# Patient Record
Sex: Female | Born: 1955
Health system: Southern US, Community
[De-identification: ages and names within clinical notes are randomized; demographics above are authoritative.]

## PROBLEM LIST (undated history)

## (undated) DIAGNOSIS — Z860101 Personal history of adenomatous and serrated colon polyps: Secondary | ICD-10-CM

## (undated) DIAGNOSIS — Z87442 Personal history of urinary calculi: Secondary | ICD-10-CM

## (undated) DIAGNOSIS — F32A Depression, unspecified: Secondary | ICD-10-CM

## (undated) DIAGNOSIS — IMO0002 Reserved for concepts with insufficient information to code with codable children: Secondary | ICD-10-CM

## (undated) DIAGNOSIS — F419 Anxiety disorder, unspecified: Secondary | ICD-10-CM

## (undated) DIAGNOSIS — R011 Cardiac murmur, unspecified: Secondary | ICD-10-CM

## (undated) DIAGNOSIS — T7840XA Allergy, unspecified, initial encounter: Secondary | ICD-10-CM

## (undated) DIAGNOSIS — K219 Gastro-esophageal reflux disease without esophagitis: Secondary | ICD-10-CM

## (undated) DIAGNOSIS — J189 Pneumonia, unspecified organism: Secondary | ICD-10-CM

## (undated) DIAGNOSIS — R519 Headache, unspecified: Secondary | ICD-10-CM

## (undated) DIAGNOSIS — Z9109 Other allergy status, other than to drugs and biological substances: Secondary | ICD-10-CM

## (undated) DIAGNOSIS — Z8711 Personal history of peptic ulcer disease: Secondary | ICD-10-CM

## (undated) DIAGNOSIS — R609 Edema, unspecified: Secondary | ICD-10-CM

## (undated) DIAGNOSIS — R51 Headache: Secondary | ICD-10-CM

## (undated) DIAGNOSIS — I10 Essential (primary) hypertension: Secondary | ICD-10-CM

## (undated) DIAGNOSIS — D571 Sickle-cell disease without crisis: Secondary | ICD-10-CM

## (undated) DIAGNOSIS — F329 Major depressive disorder, single episode, unspecified: Secondary | ICD-10-CM

## (undated) DIAGNOSIS — G4730 Sleep apnea, unspecified: Secondary | ICD-10-CM

## (undated) DIAGNOSIS — G609 Hereditary and idiopathic neuropathy, unspecified: Secondary | ICD-10-CM

## (undated) DIAGNOSIS — Z973 Presence of spectacles and contact lenses: Secondary | ICD-10-CM

## (undated) DIAGNOSIS — M797 Fibromyalgia: Secondary | ICD-10-CM

## (undated) DIAGNOSIS — M1990 Unspecified osteoarthritis, unspecified site: Secondary | ICD-10-CM

## (undated) DIAGNOSIS — Z86010 Personal history of colonic polyps: Secondary | ICD-10-CM

## (undated) DIAGNOSIS — G5601 Carpal tunnel syndrome, right upper limb: Secondary | ICD-10-CM

## (undated) DIAGNOSIS — Z8719 Personal history of other diseases of the digestive system: Secondary | ICD-10-CM

## (undated) DIAGNOSIS — R1310 Dysphagia, unspecified: Secondary | ICD-10-CM

## (undated) HISTORY — DX: Sickle-cell disease without crisis: D57.1

## (undated) HISTORY — PX: ABDOMINAL HYSTERECTOMY: SHX81

## (undated) HISTORY — DX: Hereditary and idiopathic neuropathy, unspecified: G60.9

## (undated) HISTORY — DX: Sleep apnea, unspecified: G47.30

## (undated) HISTORY — DX: Cardiac murmur, unspecified: R01.1

## (undated) HISTORY — DX: Essential (primary) hypertension: I10

## (undated) HISTORY — DX: Fibromyalgia: M79.7

## (undated) HISTORY — DX: Allergy, unspecified, initial encounter: T78.40XA

## (undated) HISTORY — DX: Gastro-esophageal reflux disease without esophagitis: K21.9

## (undated) HISTORY — PX: SPINE SURGERY: SHX786

## (undated) HISTORY — DX: Personal history of urinary calculi: Z87.442

## (undated) HISTORY — DX: Reserved for concepts with insufficient information to code with codable children: IMO0002

## (undated) HISTORY — DX: Edema, unspecified: R60.9

## (undated) HISTORY — DX: Dysphagia, unspecified: R13.10

---

## 1980-12-16 HISTORY — PX: TUBAL LIGATION: SHX77

## 1999-01-12 LAB — HM MAMMOGRAPHY: HM Mammogram: NORMAL

## 1999-07-02 ENCOUNTER — Emergency Department (HOSPITAL_COMMUNITY): Admission: EM | Admit: 1999-07-02 | Discharge: 1999-07-03 | Payer: Self-pay | Admitting: Emergency Medicine

## 1999-07-25 ENCOUNTER — Emergency Department (HOSPITAL_COMMUNITY): Admission: EM | Admit: 1999-07-25 | Discharge: 1999-07-25 | Payer: Self-pay | Admitting: Internal Medicine

## 2001-05-14 ENCOUNTER — Encounter: Admission: RE | Admit: 2001-05-14 | Discharge: 2001-07-15 | Payer: Self-pay | Admitting: Internal Medicine

## 2001-07-27 ENCOUNTER — Emergency Department (HOSPITAL_COMMUNITY): Admission: EM | Admit: 2001-07-27 | Discharge: 2001-07-27 | Payer: Self-pay | Admitting: Emergency Medicine

## 2001-11-30 ENCOUNTER — Other Ambulatory Visit: Admission: RE | Admit: 2001-11-30 | Discharge: 2001-11-30 | Payer: Self-pay | Admitting: Internal Medicine

## 2002-02-02 ENCOUNTER — Encounter: Payer: Self-pay | Admitting: Emergency Medicine

## 2002-02-02 ENCOUNTER — Emergency Department (HOSPITAL_COMMUNITY): Admission: EM | Admit: 2002-02-02 | Discharge: 2002-02-02 | Payer: Self-pay | Admitting: Emergency Medicine

## 2002-02-10 ENCOUNTER — Encounter: Payer: Self-pay | Admitting: Internal Medicine

## 2002-02-10 ENCOUNTER — Encounter: Admission: RE | Admit: 2002-02-10 | Discharge: 2002-02-10 | Payer: Self-pay | Admitting: Internal Medicine

## 2002-02-18 ENCOUNTER — Ambulatory Visit (HOSPITAL_COMMUNITY): Admission: RE | Admit: 2002-02-18 | Discharge: 2002-02-18 | Payer: Self-pay | Admitting: Internal Medicine

## 2002-02-18 ENCOUNTER — Encounter: Payer: Self-pay | Admitting: Internal Medicine

## 2002-10-15 ENCOUNTER — Emergency Department (HOSPITAL_COMMUNITY): Admission: EM | Admit: 2002-10-15 | Discharge: 2002-10-15 | Payer: Self-pay | Admitting: Emergency Medicine

## 2004-02-10 ENCOUNTER — Ambulatory Visit (HOSPITAL_COMMUNITY): Admission: RE | Admit: 2004-02-10 | Discharge: 2004-02-10 | Payer: Self-pay | Admitting: Obstetrics and Gynecology

## 2004-03-27 ENCOUNTER — Ambulatory Visit (HOSPITAL_BASED_OUTPATIENT_CLINIC_OR_DEPARTMENT_OTHER): Admission: RE | Admit: 2004-03-27 | Discharge: 2004-03-27 | Payer: Self-pay | Admitting: Family Medicine

## 2004-04-25 ENCOUNTER — Encounter (INDEPENDENT_AMBULATORY_CARE_PROVIDER_SITE_OTHER): Payer: Self-pay | Admitting: Specialist

## 2004-04-25 HISTORY — PX: LAPAROSCOPIC ASSISTED VAGINAL HYSTERECTOMY: SHX5398

## 2004-04-25 HISTORY — PX: LAPAROSCOPIC TOTAL HYSTERECTOMY: SUR800

## 2004-04-26 ENCOUNTER — Inpatient Hospital Stay (HOSPITAL_COMMUNITY): Admission: RE | Admit: 2004-04-26 | Discharge: 2004-04-28 | Payer: Self-pay | Admitting: Obstetrics and Gynecology

## 2004-06-07 ENCOUNTER — Encounter: Admission: RE | Admit: 2004-06-07 | Discharge: 2004-06-07 | Payer: Self-pay | Admitting: Family Medicine

## 2004-12-20 ENCOUNTER — Encounter: Admission: RE | Admit: 2004-12-20 | Discharge: 2004-12-20 | Payer: Self-pay | Admitting: Family Medicine

## 2005-07-28 ENCOUNTER — Emergency Department (HOSPITAL_COMMUNITY): Admission: EM | Admit: 2005-07-28 | Discharge: 2005-07-28 | Payer: Self-pay | Admitting: Emergency Medicine

## 2005-09-12 ENCOUNTER — Ambulatory Visit (HOSPITAL_COMMUNITY): Admission: RE | Admit: 2005-09-12 | Discharge: 2005-09-12 | Payer: Self-pay | Admitting: Orthopedic Surgery

## 2005-12-16 HISTORY — PX: CARPAL TUNNEL RELEASE: SHX101

## 2007-12-25 ENCOUNTER — Emergency Department (HOSPITAL_COMMUNITY): Admission: EM | Admit: 2007-12-25 | Discharge: 2007-12-25 | Payer: Self-pay | Admitting: Emergency Medicine

## 2008-09-28 ENCOUNTER — Encounter: Admission: RE | Admit: 2008-09-28 | Discharge: 2008-09-28 | Payer: Self-pay | Admitting: Family Medicine

## 2010-06-02 ENCOUNTER — Emergency Department (HOSPITAL_COMMUNITY): Admission: EM | Admit: 2010-06-02 | Discharge: 2010-06-03 | Payer: Self-pay | Admitting: Emergency Medicine

## 2010-06-07 ENCOUNTER — Ambulatory Visit (HOSPITAL_BASED_OUTPATIENT_CLINIC_OR_DEPARTMENT_OTHER): Admission: RE | Admit: 2010-06-07 | Discharge: 2010-06-07 | Payer: Self-pay | Admitting: Urology

## 2010-06-07 HISTORY — PX: OTHER SURGICAL HISTORY: SHX169

## 2010-11-20 ENCOUNTER — Emergency Department (HOSPITAL_COMMUNITY)
Admission: EM | Admit: 2010-11-20 | Discharge: 2010-11-20 | Payer: Self-pay | Source: Home / Self Care | Admitting: Family Medicine

## 2011-01-06 ENCOUNTER — Encounter: Payer: Self-pay | Admitting: Urology

## 2011-03-03 LAB — URINALYSIS, ROUTINE W REFLEX MICROSCOPIC
Glucose, UA: NEGATIVE mg/dL
Ketones, ur: NEGATIVE mg/dL
Protein, ur: 30 mg/dL — AB
Urobilinogen, UA: 0.2 mg/dL (ref 0.0–1.0)

## 2011-03-03 LAB — POCT I-STAT 4, (NA,K, GLUC, HGB,HCT)
Glucose, Bld: 117 mg/dL — ABNORMAL HIGH (ref 70–99)
HCT: 41 % (ref 36.0–46.0)
Sodium: 141 mEq/L (ref 135–145)

## 2011-03-03 LAB — URINE MICROSCOPIC-ADD ON

## 2011-05-03 NOTE — Op Note (Signed)
NAME:  Linda Buckley, Linda Buckley                           ACCOUNT NO.:  [id number]   MEDICAL RECORD NO.:  [id number]                   PATIENT TYPE:  OBV   LOCATION:  9302                                 FACILITY:  [facility]   PHYSICIAN:  Janine Limbo, M.D.            DATE OF BIRTH:  [birth year]   DATE OF PROCEDURE:  04/25/2004  DATE OF DISCHARGE:                                 OPERATIVE REPORT   PREOPERATIVE DIAGNOSES:  1. Fibroid uterus.  2. Dysmenorrhea.  3. Abdominal pain.   POSTOPERATIVE DIAGNOSES:  1. Fibroid uterus.  2. Dysmenorrhea.  3. Abdominal pain.   PROCEDURE:  Laparoscopy assisted vaginal hysterectomy.   SURGEON:  Janine Limbo, M.D.   FIRST ASSISTANT:  [place]mira J. Adline Peals.   ANESTHESIA:  General.   DISPOSITION:  [name] is a 55 year old female, para 2-0-2-2, who presents  with the above mentioned diagnosis. She understands the indications for her  surgical procedure and she accepts the risks of, but not limited to,  anesthetic complications, bleeding, infections, and possible damage to the  surrounding organs.   FINDINGS:  The uterus was upper limits normal size and there were several  fibroids present on the fundus of the uterus. There were no adhesions  present in the pelvis. There was no evidence of endometriosis.   DESCRIPTION OF PROCEDURE:  The patient was taken to the operating room where  a general anesthetic was given.  The patient's abdomen, perineum and vagina  were prepped with multiple layers of Betadine.  Examination under anesthesia  was performed.  A Foley catheter was placed in the bladder. The patient was  sterilely draped. The subumbilical area was injected with 5 mL of 0.5%  Marcaine with epinephrine.  An incision was made and the incision was  carried sharply through the subcutaneous tissue, the fascia and the anterior  peritoneum. The Hasson cannula was sutured into place. A pneumoperitoneum  was then obtained. The pelvic structures  were visualized with findings as  mentioned above. The suprapubic area was injected with 2 mL of 0.5% Marcaine  with epinephrine.  An incision was made and a 5 mm trocar was placed under  direct visualization. The pelvic structures were again visualized. Pictures  were taken of the patient's pelvic anatomy.  We were comfortable at this  point that we could proceed safely with a vaginal approach to the  hysterectomy. The laparoscope was removed.  The patient was then placed in a  more lithotomy position. The cervix was injected with a diluted solution of  Pitressin and saline. A circumferential incision was made around the cervix  and the vaginal mucosa was advanced anteriorly and posteriorly. The anterior  cul-de-sac and then the posterior cul-de-sacs were sharply entered.  Alternating from right to left, the uterosacral ligaments, paracervical  tissues, parametrial tissues, and uterine arteries were clamped, cut,  sutured and tied securely.  The uterus was then inverted  through the  posterior colpotomy. The upper pedicles were clamped and cut and the uterus  was removed from the operative field.  The upper pedicles were secured using  a free tie and then a suture ligature. Hemostasis was noted to be adequate.  The sutures attached to the uterosacral ligaments were brought out through  the vaginal angles and tied securely. A McCall culdoplasty was placed in the  posterior cul-de-sac incorporating the uterosacral ligaments bilaterally and  the posterior peritoneum.  The vaginal cuff was then closed using figure-of-  eight sutures incorporating the anterior vaginal mucosa, the anterior  peritoneum, the posterior peritoneum and the posterior vaginal mucosa.  The  McCall culdoplasty suture was tied and the apex of the vagina was noted to  elevate into the mid pelvis. Again hemostasis was thought to be adequate. We  were then ready to proceed with the remainder of the laparoscopic portion of   our procedure. The operator changed gloves. A pneumoperitoneum was then  obtained and the pelvis was inspected.  There was no evidence of bleeding.  There was no evidence of damage to the vital pelvic structures or to the  bowel. Therefore the pneumoperitoneum was allowed to escape. All instruments  were removed.  The subumbilical fascia was closed using figure-of-eight  sutures of #0 Vicryl.  The subcutaneous layer was closed using #0 Vicryl.  The skin was reapproximated using a subcuticular suture of 3-0 Vicryl.  The  suprapubic incision was closed using a subcuticular suture of 3-0 Vicryl.  Sponge, needle and instrument counts were correct.  The estimated blood loss  was approximately 75 mL.  The patient was noted to drain to clear yellow  urine.  #0 Vicryl was the suture material used throughout the procedure  except where otherwise mentioned. The patient was awakened from her  anesthetic and taken to the recovery room in stable condition.                                               Janine Limbo, M.D.    AVS/MEDQ  D:  04/25/2004  T:  04/25/2004  Job:  [id number]

## 2011-05-03 NOTE — H&P (Signed)
NAME:  Linda Buckley, Linda Buckley                           ACCOUNT NO.:  [id number]   MEDICAL RECORD NO.:  [id number]                   PATIENT TYPE:  OBV   LOCATION:  NA                                   [facility]   PHYSICIAN:  Janine Limbo, M.D.            DATE OF BIRTH:  [birth year]   DATE OF ADMISSION:  04/25/2004  DATE OF DISCHARGE:                                HISTORY & PHYSICAL   HISTORY OF PRESENT ILLNESS:  The patient is a 55 year old female, para 2-0-2-  2, who presents for laparoscopically-assisted vaginal hysterectomy.  The  patient had an ultrasound performed in April of 2005 that showed a 10.4 x  4.9 cm uterus.  A 1.12 cm fibroid was noted.  The endometrium measured 0.42  cm.  The ovaries appeared normal.  The patient complains of dysmenorrhea,  pelvic pain, and a history of irregular bleeding.  A CT scan has been  performed and no abnormalities were noted.  The patient has had an  endometrial biopsy that showed benign elements.  She has been treated with  oral contraceptives as well as narcotic and non-narcotic pain medications.  Her discomfort improved briefly, but now her discomfort has returned and it  is as bad as it has ever been.  She wishes to proceed at this time with  definite therapy.  The patient denies a past history of sexually transmitted  diseases.  She has had a tubal ligation in the past.  She has had two  miscarriages in the past.  She has had two full term vaginal deliveries.   PAST MEDICAL HISTORY:  The patient has a history of migraine headaches.  Otherwise, she is quite healthy.   SOCIAL HISTORY:  The patient drinks alcohol socially.  She denies cigarette  use and other recreational drug uses.   REVIEW OF SYSTEMS:  Please see history of present illness.   FAMILY HISTORY:  The patient's mother has hypertension.  Her mother,  brother, and her son have diabetes.  Her son also has mental and emotional  problems.   PHYSICAL EXAMINATION:  VITAL  SIGNS:  Weight is 209 pounds.  HEENT:  Within normal limits.  CHEST:  Clear.  HEART:  Regular rate and rhythm.  BREASTS:  Without masses.  ABDOMEN:  Nontender.  EXTREMITIES:  Within normal limits.  NEUROLOGY:  Grossly normal.  PELVIC:  External genitalia is normal.  The vagina is normal except for a  small cystocele and a small rectocele.  The uterus is upper limits of normal  size.  Adnexa no masses.   ASSESSMENT:  1. Dysmenorrhea.  2. Abdominal pain.  3. Fibroid uterus.   PLAN:  The patient will undergo a laparoscopically-assisted vaginal  hysterectomy.  She understands the indications for her procedure and she  accepts the risks of, but not limited to, anesthetic complications,  bleeding, infections, and possible damage to the surrounding organs.  Janine Limbo, M.D.    AVS/MEDQ  D:  04/24/2004  T:  04/24/2004  Job:  [id number]   cc:   Candyce Churn. Allyne Gee, M.D.  [address]  [address]  [place]  [place] [address]  Fax: [phone]

## 2011-05-03 NOTE — Discharge Summary (Signed)
[name], [name]                           ACCOUNT NO.:  [id number]   MEDICAL RECORD NO.:  [id number]                   PATIENT TYPE:  INP   LOCATION:  9302                                 [facility]   PHYSICIAN:  Janine Limbo, M.D.            DATE OF BIRTH:  December 20, 1955   DATE OF ADMISSION:  04/25/2004  DATE OF DISCHARGE:  04/28/2004                                 DISCHARGE SUMMARY   DISCHARGE DIAGNOSES:  1. Fibroid uterus.  2. Dysmenorrhea.  3. Abdominal pain.  4. Anemia.   OPERATION:  On the date of admission the patient underwent a laparoscopic-  assisted vaginal hysterectomy, tolerating the procedure well.  The patient  was found to have an upper-limits-of-normal-size fibroid uterus with normal-  appearing tubes and ovaries bilaterally.   HISTORY OF PRESENT ILLNESS:  [name] is a 55 year old female para 2-0-2-2  who presents for a laparoscopic-assisted vaginal hysterectomy because of  dysmenorrhea and abdominal pain.  Please see the patient's dictated History  and Physical Examination for details.   PREOPERATIVE PHYSICAL EXAMINATION:  VITAL SIGNS:  Weight is 209 pounds.  GENERAL:  Within normal limits.  PELVIC:  External genitalia is normal.  Vagina is normal except for a small  cystocele and small rectocele.  The uterus is upper limits of normal size.  Adnexa is without masses.   HOSPITAL COURSE:  On the date of admission the patient underwent  aforementioned procedures, tolerating them well.  Postoperative course was  marked by the patient experiencing urinary retention and was not able to  void on her own until postoperative day #3.  Postoperative hemoglobin was  10.8 (preoperative hemoglobin 12.6).  By postoperative day #3 the patient  had resumed bowel and bladder function and was therefore deemed ready for  discharge home.   DISCHARGE MEDICATIONS:  1. Ferrous sulfate 325 mg one tablet twice daily for 6 weeks.  2. Colace 100 mg one tablet twice  daily until bowel movements are regular.  3. Phenergan 25 mg one tablet q.6h. as needed for nausea.  4. Vicodin one to two tablets q.4-6h. as needed for pain   FOLLOW-UP:  The patient is scheduled for a 6-weeks postoperative visit with  Dr. Stefano Gaul on June 05, 2004 at 2:30 p.m.   DISCHARGE INSTRUCTIONS:  The patient was given a copy of [facility] postoperative instruction sheet.  She was further advised to avoid  driving for 2 weeks, heavy lifting for 4 weeks, and intercourse for 6 weeks.  The patient's diet was without restriction.   FINAL PATHOLOGY:  Uterus, hysterectomy:  Benign cervix with chronic  cervicitis, benign inactive endometrium, adenomyosis, leiomyoma.     Linda Buckley.                    Janine Limbo, M.D.    EJP/MEDQ  D:  05/06/2004  T:  05/07/2004  Job:  [id number] 

## 2012-01-13 LAB — HM PAP SMEAR: HM PAP: NORMAL

## 2013-10-11 ENCOUNTER — Encounter (HOSPITAL_COMMUNITY): Payer: Self-pay | Admitting: Emergency Medicine

## 2013-10-11 ENCOUNTER — Emergency Department (HOSPITAL_COMMUNITY): Payer: 59

## 2013-10-11 ENCOUNTER — Emergency Department (HOSPITAL_COMMUNITY)
Admission: EM | Admit: 2013-10-11 | Discharge: 2013-10-11 | Disposition: A | Discharge status: Home / Self Care | Payer: 59 | Attending: Emergency Medicine | Admitting: Emergency Medicine

## 2013-10-11 DIAGNOSIS — R5381 Other malaise: Secondary | ICD-10-CM | POA: Insufficient documentation

## 2013-10-11 DIAGNOSIS — J189 Pneumonia, unspecified organism: Secondary | ICD-10-CM | POA: Diagnosis present

## 2013-10-11 DIAGNOSIS — J159 Unspecified bacterial pneumonia: Secondary | ICD-10-CM | POA: Insufficient documentation

## 2013-10-11 LAB — CBC WITH DIFFERENTIAL/PLATELET
Basophils Absolute: 0 10*3/uL (ref 0.0–0.1)
Lymphs Abs: 1.1 10*3/uL (ref 0.7–4.0)
MCH: 30.1 pg (ref 26.0–34.0)
MCHC: 32.4 g/dL (ref 30.0–36.0)
MCV: 92.8 fL (ref 78.0–100.0)
Monocytes Relative: 5 % (ref 3–12)
Neutrophils Relative %: 66 % (ref 43–77)

## 2013-10-11 LAB — BASIC METABOLIC PANEL
GFR calc Af Amer: 73 mL/min — ABNORMAL LOW (ref >90)
Sodium: 139 mEq/L (ref 135–145)

## 2013-10-11 MED ORDER — AMOXICILLIN-POT CLAVULANATE 875-125 MG PO TABS: 1.0000 | ORAL_TABLET | Freq: Two times a day (BID) | ORAL | Status: AC

## 2013-10-11 MED ORDER — ACETAMINOPHEN 325 MG PO TABS
650.0000 mg | ORAL_TABLET | Freq: Once | ORAL | Status: AC
  Administered 2013-10-11: 650 mg via ORAL
  Filled 2013-10-11: qty 2

## 2013-10-11 MED ORDER — SODIUM CHLORIDE 0.9 % IV BOLUS (SEPSIS)
1000.0000 mL | INTRAVENOUS | Status: AC
  Administered 2013-10-11: 1000 mL via INTRAVENOUS

## 2013-10-11 MED ORDER — AMOXICILLIN-POT CLAVULANATE 875-125 MG PO TABS
1.0000 | ORAL_TABLET | Freq: Once | ORAL | Status: AC
  Administered 2013-10-11: 1 via ORAL
  Filled 2013-10-11: qty 1

## 2013-10-11 MED ORDER — OXYCODONE-ACETAMINOPHEN 5-325 MG PO TABS: 1.0000 | ORAL_TABLET | Freq: Four times a day (QID) | ORAL | Status: DC | PRN

## 2013-10-11 NOTE — Progress Notes (Signed)
   CARE MANAGEMENT ED NOTE 10/11/2013  Patient:  JHOSELYN, RUFFINI   Account Number:  [id number]  Date Initiated:  10/11/2013  Documentation initiated by:  Edd Arbour  Subjective/Objective Assessment:   57 yr old [facility] [place] resident with one ED visit in last 6 months Pt with betty reese listed as pcp but pt states pcp is Dr Juluis Rainier at [facility] [employer]     Subjective/Objective Assessment Detail:   c/o chest pain for past 2 weeks, dx recently with URI, chest pain, weakness/ lethargy, lightheadedness Imaging indicating PNA     Action/Plan:   Spoke with pt updated EPIC pcp   Action/Plan Detail:   Anticipated DC Date:  10/11/2013     Status Recommendation to Physician:   Result of Recommendation:    Other ED Services  Consult Working Plan    DC Planning Services  Other  PCP issues  Outpatient Services - Pt will follow up    Choice offered to / List presented to:            Status of service:  Completed, signed off  ED Comments:   ED Comments Detail:

## 2013-10-11 NOTE — ED Notes (Signed)
Pt complains of chest pain for past 2 weeks, dx recently with URI, states has not gotten better. States she has had sob for past 4 days, 98% on RA. Pt states she has cough 4 days, non productive. Also complains of weakness/ lethargy, lightheadedness. Pt states chest pain is centralized, worse with cough.

## 2013-10-11 NOTE — ED Provider Notes (Signed)
CSN: [id number]     Arrival date & time 10/11/13  4098 History   First MD Initiated Contact with Patient 10/11/13 0820     Chief Complaint  Patient presents with  . Chest Pain  . Shortness of Breath   (Consider location/radiation/quality/duration/timing/severity/associated sxs/prior Treatment) Patient is a 57 y.o. female presenting with shortness of breath and cough. The history is provided by the patient.  Shortness of Breath Associated symptoms: chest pain (chest wall pain with coughing) and cough   Associated symptoms: no abdominal pain, no fever, no headaches, no neck pain and no vomiting   Cough Cough characteristics:  Non-productive Severity:  Moderate Onset quality:  Gradual Duration:  2 weeks Timing:  Constant Progression:  Unchanged Chronicity:  New Smoker: no   Relieved by:  Nothing Worsened by:  Nothing tried Ineffective treatments: Rx cough medicine, currently finishing a z pak. Associated symptoms: chest pain (chest wall pain with coughing) and shortness of breath   Associated symptoms: no fever and no headaches   Shortness of breath:    Severity:  Mild   Onset quality:  Gradual   Duration: last 2-3 days.   Timing:  Constant   Progression:  Unchanged   History reviewed. No pertinent past medical history. History reviewed. No pertinent past surgical history. History reviewed. No pertinent family history. History  Substance Use Topics  . Smoking status: Never Smoker   . Smokeless tobacco: Not on file  . Alcohol Use: Yes   OB History   Grav Para Term Preterm Abortions TAB SAB Ect Mult Living                 Review of Systems  Constitutional: Positive for fatigue. Negative for fever.  HENT: Negative for congestion and drooling.   Eyes: Negative for pain.  Respiratory: Positive for cough and shortness of breath.   Cardiovascular: Positive for chest pain (chest wall pain with coughing).  Gastrointestinal: Negative for nausea, vomiting, abdominal pain and  diarrhea.  Genitourinary: Negative for dysuria and hematuria.  Musculoskeletal: Negative for back pain, gait problem and neck pain.  Skin: Negative for color change.  Neurological: Negative for dizziness and headaches.  Hematological: Negative for adenopathy.  Psychiatric/Behavioral: Negative for behavioral problems.  All other systems reviewed and are negative.    Allergies  Aspirin  Home Medications  No current outpatient prescriptions on file. BP 168/90  Pulse 81  Temp(Src) 98.3 F (36.8 C) (Oral)  Resp 20  SpO2 99% Physical Exam  Nursing note and vitals reviewed. Constitutional: She is oriented to person, place, and time. She appears well-developed and well-nourished.  HENT:  Head: Normocephalic.  Mouth/Throat: Oropharynx is clear and moist. No oropharyngeal exudate.  Eyes: Conjunctivae and EOM are normal. Pupils are equal, round, and reactive to light.  Neck: Normal range of motion. Neck supple.  Cardiovascular: Normal rate, regular rhythm, normal heart sounds and intact distal pulses.  Exam reveals no gallop and no friction rub.   No murmur heard. Pulmonary/Chest: Effort normal and breath sounds normal. No respiratory distress. She has no wheezes. She exhibits tenderness (diffuse anterior chest wall ttp).  Mild crackle heard in right lung base.   Abdominal: Soft. Bowel sounds are normal. There is no tenderness. There is no rebound and no guarding.  Musculoskeletal: Normal range of motion. She exhibits no edema and no tenderness.  Neurological: She is alert and oriented to person, place, and time.  Skin: Skin is warm and dry.  Psychiatric: She has a normal mood and  affect. Her behavior is normal.    ED Course  Procedures (including critical care time) Labs Review Labs Reviewed  BASIC METABOLIC PANEL - Abnormal; Notable for the following:    Glucose, Bld 113 (*)    GFR calc non Af Amer 63 (*)    GFR calc Af Amer 73 (*)    All other components within normal limits   CBC WITH DIFFERENTIAL   Imaging Review Dg Chest 2 View  10/11/2013   CLINICAL DATA:  Mid chest pain, shortness of breath  EXAM: CHEST  2 VIEW  COMPARISON:  None.  FINDINGS: Very mild patchy left lower lobe opacity, possibly reflecting pneumonia. No pleural effusion or pneumothorax.  The heart is normal in size.  Visualized osseous structures are within normal limits.  IMPRESSION: Very mild patchy left lower lobe opacity, possibly reflecting pneumonia.   Electronically Signed   By: Charline Bills M.D.   On: 10/11/2013 09:20    EKG Interpretation   None       MDM   1. CAP (community acquired pneumonia)    8:37 AM 57 y.o. female who presents with URI symptoms for approximately 2 weeks. The patient denies any fevers but has felt fatigued with continued nonproductive cough. She notes no improvement with prescription cough medicine and is currently on her fourth day of azithromycin. She notes diffuse chest wall aching pain which is constant and exacerbated by coughing. Her chest wall pain is reproducible on exam. She is afebrile and vital signs are unremarkable here. She has had mild sob for the last few days, likely assoc w/ viral URI although pna is on the ddx. Will provide symptomatic therapy and get screening lab work and a chest x-ray.  10:17 AM: CXR cw pna, labs non-contrib, VS unremarkable, pt continues to appear well. Will provide pain medicine for chest wall pain and add augmentin to azithro for pna. I have discussed the diagnosis/risks/treatment options with the patient and believe the pt to be eligible for discharge home to follow-up with pcp in the next 1-2 days. We also discussed returning to the ED immediately if new or worsening sx occur. We discussed the sx which are most concerning (e.g., inability to tolerate abx, inc cp or wob, fever) that necessitate immediate return. Any new prescriptions provided to the patient are listed below.  Discharge Medication List as of 10/11/2013  10:20 AM    START taking these medications   Details  amoxicillin-clavulanate (AUGMENTIN) 875-125 MG per tablet Take 1 tablet by mouth 2 (two) times daily., Starting 10/11/2013, Last dose on Thu 10/21/13, Print    oxyCODONE-acetaminophen (PERCOCET) 5-325 MG per tablet Take 1 tablet by mouth every 6 (six) hours as needed for pain., Starting 10/11/2013, Until Discontinued, Print         Junius Argyle, MD 10/11/13 [phone]

## 2013-10-14 ENCOUNTER — Encounter (HOSPITAL_COMMUNITY): Payer: Self-pay | Admitting: Emergency Medicine

## 2013-10-14 ENCOUNTER — Emergency Department (HOSPITAL_COMMUNITY)
Admission: EM | Admit: 2013-10-14 | Discharge: 2013-10-14 | Disposition: A | Discharge status: Home / Self Care | Payer: 59 | Attending: Emergency Medicine | Admitting: Emergency Medicine

## 2013-10-14 ENCOUNTER — Emergency Department (HOSPITAL_COMMUNITY): Payer: 59

## 2013-10-14 DIAGNOSIS — R109 Unspecified abdominal pain: Secondary | ICD-10-CM | POA: Insufficient documentation

## 2013-10-14 DIAGNOSIS — M545 Low back pain, unspecified: Secondary | ICD-10-CM | POA: Insufficient documentation

## 2013-10-14 DIAGNOSIS — J159 Unspecified bacterial pneumonia: Secondary | ICD-10-CM | POA: Insufficient documentation

## 2013-10-14 DIAGNOSIS — R000 Tachycardia, unspecified: Secondary | ICD-10-CM | POA: Insufficient documentation

## 2013-10-14 DIAGNOSIS — J189 Pneumonia, unspecified organism: Secondary | ICD-10-CM

## 2013-10-14 DIAGNOSIS — Z79899 Other long term (current) drug therapy: Secondary | ICD-10-CM | POA: Insufficient documentation

## 2013-10-14 DIAGNOSIS — Z792 Long term (current) use of antibiotics: Secondary | ICD-10-CM | POA: Insufficient documentation

## 2013-10-14 DIAGNOSIS — IMO0002 Reserved for concepts with insufficient information to code with codable children: Secondary | ICD-10-CM | POA: Insufficient documentation

## 2013-10-14 LAB — CBC WITH DIFFERENTIAL/PLATELET
Basophils Relative: 0 % (ref 0–1)
Eosinophils Absolute: 0.2 10*3/uL (ref 0.0–0.7)
HCT: 43.4 % (ref 36.0–46.0)
Hemoglobin: 14.4 g/dL (ref 12.0–15.0)
Lymphs Abs: 0.8 10*3/uL (ref 0.7–4.0)
MCH: 30.4 pg (ref 26.0–34.0)
MCHC: 33.2 g/dL (ref 30.0–36.0)
MCV: 91.6 fL (ref 78.0–100.0)
Monocytes Absolute: 0.4 10*3/uL (ref 0.1–1.0)
Monocytes Relative: 6 % (ref 3–12)
Neutro Abs: 5 10*3/uL (ref 1.7–7.7)
Neutrophils Relative %: 77 % (ref 43–77)
RBC: 4.74 MIL/uL (ref 3.87–5.11)

## 2013-10-14 LAB — BASIC METABOLIC PANEL WITH GFR
BUN: 11 mg/dL (ref 6–23)
CO2: 29 meq/L (ref 19–32)
Calcium: 10.1 mg/dL (ref 8.4–10.5)
Chloride: 101 meq/L (ref 96–112)
Creatinine, Ser: 0.96 mg/dL (ref 0.50–1.10)
GFR calc Af Amer: 76 mL/min — ABNORMAL LOW
GFR calc non Af Amer: 65 mL/min — ABNORMAL LOW
Glucose, Bld: 114 mg/dL — ABNORMAL HIGH (ref 70–99)
Potassium: 4.1 meq/L (ref 3.5–5.1)
Sodium: 139 meq/L (ref 135–145)

## 2013-10-14 MED ORDER — ALBUTEROL SULFATE (5 MG/ML) 0.5% IN NEBU
5.0000 mg | INHALATION_SOLUTION | RESPIRATORY_TRACT | Status: DC | PRN
  Administered 2013-10-14: 5 mg via RESPIRATORY_TRACT
  Filled 2013-10-14: qty 1

## 2013-10-14 MED ORDER — ACETAMINOPHEN 500 MG PO TABS
1000.0000 mg | ORAL_TABLET | Freq: Once | ORAL | Status: AC
  Administered 2013-10-14: 1000 mg via ORAL
  Filled 2013-10-14: qty 2

## 2013-10-14 MED ORDER — ALBUTEROL SULFATE HFA 108 (90 BASE) MCG/ACT IN AERS
2.0000 | INHALATION_SPRAY | Freq: Once | RESPIRATORY_TRACT | Status: AC
  Administered 2013-10-14: 2 via RESPIRATORY_TRACT
  Filled 2013-10-14: qty 6.7

## 2013-10-14 MED ORDER — IPRATROPIUM BROMIDE 0.02 % IN SOLN
0.5000 mg | RESPIRATORY_TRACT | Status: DC | PRN
  Administered 2013-10-14: 0.5 mg via RESPIRATORY_TRACT
  Filled 2013-10-14: qty 2.5

## 2013-10-14 MED ORDER — SODIUM CHLORIDE 0.9 % IV BOLUS (SEPSIS)
1000.0000 mL | Freq: Once | INTRAVENOUS | Status: AC
  Administered 2013-10-14: 1000 mL via INTRAVENOUS

## 2013-10-14 MED ORDER — MORPHINE SULFATE 4 MG/ML IJ SOLN
4.0000 mg | Freq: Once | INTRAMUSCULAR | Status: AC
  Administered 2013-10-14: 4 mg via INTRAVENOUS
  Filled 2013-10-14: qty 1

## 2013-10-14 NOTE — ED Notes (Signed)
Pt was dx with pneumonia earlier today and urgent care and states she got a shot of steriods and a neb but not any better. Pt has been sick since the 15th

## 2013-10-14 NOTE — ED Provider Notes (Signed)
CSN: [id number]     Arrival date & time 10/14/13  1953 History   First MD Initiated Contact with Patient 10/14/13 2006     Chief Complaint  Patient presents with  . Shortness of Breath  . Cough   (Consider location/radiation/quality/duration/timing/severity/associated sxs/prior Treatment) HPI Comments: 57 year old female presents with recurrent cough. She's had a cough for 2 weeks and was diagnosed with pneumonia 3 days ago. She was started on Augmentin as not having any improvement. She went to her doctor today and was given a Depo-Medrol IM steroid, as well as a dose of albuterol. These did not seem to help so she came to the ER for further evaluation. She's taking the cough is nonproductive she had a low blood posttussive emesis. She's also having a headache and some chest and low back pain from the coughing. She had some Motrin for the headache which mildly helped.   History reviewed. No pertinent past medical history. History reviewed. No pertinent past surgical history. No family history on file. History  Substance Use Topics  . Smoking status: Never Smoker   . Smokeless tobacco: Not on file  . Alcohol Use: Yes   OB History   Grav Para Term Preterm Abortions TAB SAB Ect Mult Living                 Review of Systems  Constitutional: Positive for fever (tmax 101).  Respiratory: Positive for cough and shortness of breath.   Cardiovascular: Positive for chest pain.  Gastrointestinal: Positive for vomiting and abdominal pain.  Musculoskeletal: Positive for back pain.  Neurological: Positive for headaches.  All other systems reviewed and are negative.    Allergies  Aspirin  Home Medications   Current Outpatient Rx  Name  Route  Sig  Dispense  Refill  . albuterol (PROVENTIL HFA;VENTOLIN HFA) 108 (90 BASE) MCG/ACT inhaler   Inhalation   Inhale 2 puffs into the lungs every 4 (four) hours as needed for shortness of breath.         Marland Kitchen amoxicillin-clavulanate (AUGMENTIN)  875-125 MG per tablet   Oral   Take 1 tablet by mouth 2 (two) times daily.   20 tablet   0   . EPINEPHrine (EPIPEN) 0.3 mg/0.3 mL SOAJ injection   Intramuscular   Inject 0.3 mg into the muscle once.         . fluticasone (FLONASE) 50 MCG/ACT nasal spray   Nasal   Place 2 sprays into the nose every morning.         . hydrOXYzine (ATARAX/VISTARIL) 10 MG tablet   Oral   Take 10-20 mg by mouth 3 (three) times daily as needed (For allergies or itching.).         Marland Kitchen ibuprofen (ADVIL,MOTRIN) 200 MG tablet   Oral   Take 800 mg by mouth every 6 (six) hours as needed (For aches and pain.).         Marland Kitchen ketotifen (ZADITOR) 0.025 % ophthalmic solution   Both Eyes   Place 1 drop into both eyes 2 (two) times daily as needed (For allergies.).         Marland Kitchen levocetirizine (XYZAL) 5 MG tablet   Oral   Take 5 mg by mouth every evening.          Marland Kitchen oxyCODONE-acetaminophen (PERCOCET) 5-325 MG per tablet   Oral   Take 1 tablet by mouth every 6 (six) hours as needed for pain.   15 tablet   0   .  predniSONE (STERAPRED UNI-PAK) 5 MG TABS tablet   Oral   Take 5 mg by mouth taper from 4 doses each day to 1 dose and stop.         Marland Kitchen azithromycin (ZITHROMAX) 250 MG tablet   Oral   Take 250 mg by mouth daily. 500 mg on day one 250 mg daily for 4 days          BP 137/74  Pulse 91  Temp(Src) 98.8 F (37.1 C) (Oral)  Resp 18  SpO2 99% Physical Exam  Nursing note and vitals reviewed. Constitutional: She is oriented to person, place, and time. She appears well-developed and well-nourished.  HENT:  Head: Normocephalic and atraumatic.  Right Ear: External ear normal.  Left Ear: External ear normal.  Nose: Nose normal.  Eyes: Right eye exhibits no discharge. Left eye exhibits no discharge.  Cardiovascular: Regular rhythm and normal heart sounds.  Tachycardia present.   Pulmonary/Chest: Tachypnea (Mild) noted. She has decreased breath sounds in the left lower field. She has wheezes. She  exhibits tenderness.  Abdominal: Soft. There is tenderness (mild, nonspecific).  Musculoskeletal: She exhibits no edema.  Neurological: She is alert and oriented to person, place, and time.  Skin: Skin is warm and dry.    ED Course  Procedures (including critical care time) Labs Review Labs Reviewed  BASIC METABOLIC PANEL - Abnormal; Notable for the following:    Glucose, Bld 114 (*)    GFR calc non Af Amer 65 (*)    GFR calc Af Amer 76 (*)    All other components within normal limits  CBC WITH DIFFERENTIAL  CG4 I-STAT (LACTIC ACID)   Imaging Review Dg Chest 2 View  10/14/2013   CLINICAL DATA:  Cough, shortness of breath  EXAM: CHEST  2 VIEW  COMPARISON:  10/11/2013  FINDINGS: Cardiomediastinal silhouette is stable. No pulmonary edema. No pleural effusion. Slight improved aeration left base with streaky residual atelectasis or infiltrate.  IMPRESSION: No pulmonary edema. No pleural effusion. Slight improved aeration left base with streaky residual atelectasis or infiltrate.   Electronically Signed   By: Natasha Mead M.D.   On: 10/14/2013 20:54    EKG Interpretation     Ventricular Rate:  108 PR Interval:  135 QRS Duration: 100 QT Interval:  360 QTC Calculation: 482 R Axis:   11 Text Interpretation:  Sinus tachycardia No acute ischemia Baseline wander No significant change since last tracing            MDM   1. CAP (community acquired pneumonia)    The patient's symptoms resolved with 1 DuoNeb. She's not hypoxic. She has a normal white count and no lactic acidosis. Her curb 65 score is 0. She has already finished 1 Z-Pak is currently on Augmentin for her pneumonia. With her PNA resolving on chest x-ray and her other symptoms controlled in the ER I will have the patient stick to the Augmentin and follow up with her PCP. She seems to already be on steroids as well. I discussed strict return precautions and she wants to be discharged.    Audree Camel, MD 10/14/13  [phone]

## 2013-10-15 ENCOUNTER — Telehealth: Payer: Self-pay

## 2013-10-15 NOTE — Telephone Encounter (Signed)
pt aware of appt date and time. Carron Curie, CMA

## 2013-10-15 NOTE — Telephone Encounter (Signed)
Pt returned call and can be reached @ the same #. Emily E McAlister °

## 2013-10-15 NOTE — Telephone Encounter (Signed)
Cy does not have anything available. In the meantime Dr. Delton Coombes had a cancellation on Tuesday so I have LMTCbx1 to offer this appt to the pt, Tuesday 11-4 at 11:30am. I have scheduled the in this slot to hold it. If she cannot come then need to cancel appt. Carron Curie, CMA

## 2013-10-15 NOTE — Telephone Encounter (Signed)
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 She has been in the ER twice for pneumonia recently and he requests she be worked in with Dr. Maple Hudson today if possible. Please advise if appt available. Carron Curie, CMA

## 2013-10-17 ENCOUNTER — Encounter (HOSPITAL_COMMUNITY): Payer: Self-pay | Admitting: Emergency Medicine

## 2013-10-17 ENCOUNTER — Emergency Department (HOSPITAL_COMMUNITY): Payer: 59

## 2013-10-17 ENCOUNTER — Emergency Department (HOSPITAL_COMMUNITY)
Admission: EM | Admit: 2013-10-17 | Discharge: 2013-10-17 | Disposition: A | Discharge status: Home / Self Care | Payer: 59 | Attending: Emergency Medicine | Admitting: Emergency Medicine

## 2013-10-17 DIAGNOSIS — R1110 Vomiting, unspecified: Secondary | ICD-10-CM | POA: Insufficient documentation

## 2013-10-17 DIAGNOSIS — IMO0002 Reserved for concepts with insufficient information to code with codable children: Secondary | ICD-10-CM | POA: Insufficient documentation

## 2013-10-17 DIAGNOSIS — R5381 Other malaise: Secondary | ICD-10-CM | POA: Insufficient documentation

## 2013-10-17 DIAGNOSIS — Z79899 Other long term (current) drug therapy: Secondary | ICD-10-CM | POA: Insufficient documentation

## 2013-10-17 DIAGNOSIS — Z792 Long term (current) use of antibiotics: Secondary | ICD-10-CM | POA: Insufficient documentation

## 2013-10-17 DIAGNOSIS — R1032 Left lower quadrant pain: Secondary | ICD-10-CM | POA: Insufficient documentation

## 2013-10-17 DIAGNOSIS — R109 Unspecified abdominal pain: Secondary | ICD-10-CM | POA: Diagnosis present

## 2013-10-17 DIAGNOSIS — J189 Pneumonia, unspecified organism: Secondary | ICD-10-CM | POA: Insufficient documentation

## 2013-10-17 DIAGNOSIS — R5383 Other fatigue: Secondary | ICD-10-CM

## 2013-10-17 LAB — CBC
HCT: 40.4 % (ref 36.0–46.0)
Hemoglobin: 13.5 g/dL (ref 12.0–15.0)
MCH: 30.8 pg (ref 26.0–34.0)
MCV: 92.2 fL (ref 78.0–100.0)
Platelets: 216 10*3/uL (ref 150–400)
RBC: 4.38 MIL/uL (ref 3.87–5.11)
RDW: 12.2 % (ref 11.5–15.5)
WBC: 8.2 10*3/uL (ref 4.0–10.5)

## 2013-10-17 LAB — COMPREHENSIVE METABOLIC PANEL
AST: 34 U/L (ref 0–37)
Alkaline Phosphatase: 74 U/L (ref 39–117)
CO2: 27 mEq/L (ref 19–32)
Calcium: 9.8 mg/dL (ref 8.4–10.5)
Chloride: 98 mEq/L (ref 96–112)
Creatinine, Ser: 0.82 mg/dL (ref 0.50–1.10)
GFR calc Af Amer: >90 mL/min (ref >90)
GFR calc non Af Amer: 79 mL/min — ABNORMAL LOW (ref >90)
Glucose, Bld: 138 mg/dL — ABNORMAL HIGH (ref 70–99)
Total Bilirubin: 0.3 mg/dL (ref 0.3–1.2)

## 2013-10-17 LAB — URINALYSIS, ROUTINE W REFLEX MICROSCOPIC
Bilirubin Urine: NEGATIVE
Glucose, UA: NEGATIVE mg/dL
Hgb urine dipstick: NEGATIVE
Leukocytes, UA: NEGATIVE
Nitrite: NEGATIVE
Protein, ur: NEGATIVE mg/dL
Specific Gravity, Urine: 1.015 (ref 1.005–1.030)
pH: 6.5 (ref 5.0–8.0)

## 2013-10-17 LAB — POCT I-STAT, CHEM 8
BUN: 13 mg/dL (ref 6–23)
Calcium, Ion: 1.18 mmol/L (ref 1.12–1.23)
Chloride: 102 mEq/L (ref 96–112)
Creatinine, Ser: 1.1 mg/dL (ref 0.50–1.10)
Glucose, Bld: 143 mg/dL — ABNORMAL HIGH (ref 70–99)
TCO2: 28 mmol/L (ref 0–100)

## 2013-10-17 LAB — LIPASE, BLOOD: Lipase: 25 U/L (ref 11–59)

## 2013-10-17 MED ORDER — ONDANSETRON HCL 4 MG/2ML IJ SOLN
4.0000 mg | Freq: Once | INTRAMUSCULAR | Status: AC
  Administered 2013-10-17: 4 mg via INTRAVENOUS
  Filled 2013-10-17: qty 2

## 2013-10-17 MED ORDER — ONDANSETRON 4 MG PO TBDP: 4.0000 mg | ORAL_TABLET | Freq: Once | ORAL | Status: DC

## 2013-10-17 MED ORDER — IOHEXOL 300 MG/ML  SOLN
50.0000 mL | Freq: Once | INTRAMUSCULAR | Status: AC | PRN
  Administered 2013-10-17: 50 mL via ORAL

## 2013-10-17 MED ORDER — IOHEXOL 300 MG/ML  SOLN
100.0000 mL | Freq: Once | INTRAMUSCULAR | Status: AC | PRN
  Administered 2013-10-17: 100 mL via INTRAVENOUS

## 2013-10-17 MED ORDER — MORPHINE SULFATE 4 MG/ML IJ SOLN
4.0000 mg | Freq: Once | INTRAMUSCULAR | Status: AC
  Administered 2013-10-17: 4 mg via INTRAVENOUS
  Filled 2013-10-17: qty 1

## 2013-10-17 NOTE — ED Notes (Signed)
Pt is lethargic, expiratory wheezing auscultated b/l.  weak cough small amount of mucus production per pt. Pain 10/10 in right flank radiating to back sharp in intensity.

## 2013-10-17 NOTE — ED Provider Notes (Signed)
8:18 PM Accepted care from [facility] 29F seen previously by me for pna. Here w/ fatigue abd pain.   10:58 PM: I interpreted/reviewed the labs and/or imaging which were non-contributory.  Pt feeling mildly better, has f/u w/ pulmonologist next week. She continues to appear well on exam.  I have discussed the diagnosis/risks/treatment options with the patient and believe the pt to be eligible for discharge home to follow-up with her pulmonologist next week. We also discussed returning to the ED immediately if new or worsening sx occur. We discussed the sx which are most concerning (e.g., worsening abd pain, fever) that necessitate immediate return. Any new prescriptions provided to the patient are listed below.  New Prescriptions   No medications on file   Clinical Impression 1. Abdominal pain   2. Fatigue      Junius Argyle, MD 10/18/13 [phone]

## 2013-10-17 NOTE — ED Notes (Signed)
Pt from home reports that she was seen at this facility Monday and dx with PNA, given abx and has been taking them with no relief. Pt is reporting mid, lower back pain and LLQ pain. Pt is A&O and in NAD

## 2013-10-17 NOTE — ED Provider Notes (Signed)
CSN: [id number]     Arrival date & time 10/17/13  1818 History  This chart was scribed for non-physician practitioner, Teressa Lower, NP working with Purvis Sheffield, MD by Greggory Stallion, ED scribe. This patient was seen in room WTR5/WTR5 and the patient's care was started at 6:52 PM.   Chief Complaint  Patient presents with  . Back Pain  . Pneumonia   The history is provided by the patient. No language interpreter was used.   HPI Comments: Linda Buckley is a 57 y.o. female who presents to the Emergency Department complaining of pneumonia that she was diagnosed with 6 days ago. The cough has been persistent for 3 weeks. Pt states she was given antibiotics and they have provided no relief. She was also give an inhaler. The last time she used it was about one hour ago. She states she is also having mid to lower back pain and LLQ abdominal pain. Pt saw Dr. Zachery Dauer 3 days ago and was told to come to the ED if her symptoms worsened. She states she started having emesis today and has had 5 episodes. She denies fever.   No past medical history on file. History reviewed. No pertinent past surgical history. No family history on file. History  Substance Use Topics  . Smoking status: Never Smoker   . Smokeless tobacco: Not on file  . Alcohol Use: Yes   OB History   Grav Para Term Preterm Abortions TAB SAB Ect Mult Living                 Review of Systems  Constitutional: Negative for fever.  Respiratory: Positive for cough.   Gastrointestinal: Positive for vomiting and abdominal pain.  Musculoskeletal: Positive for back pain.  All other systems reviewed and are negative.    Allergies  Aspirin  Home Medications   Current Outpatient Rx  Name  Route  Sig  Dispense  Refill  . albuterol (PROVENTIL HFA;VENTOLIN HFA) 108 (90 BASE) MCG/ACT inhaler   Inhalation   Inhale 2 puffs into the lungs every 4 (four) hours as needed for shortness of breath.         Marland Kitchen amoxicillin-clavulanate  (AUGMENTIN) 875-125 MG per tablet   Oral   Take 1 tablet by mouth 2 (two) times daily.   20 tablet   0   . azithromycin (ZITHROMAX) 250 MG tablet   Oral   Take 250 mg by mouth daily. 500 mg on day one 250 mg daily for 4 days         . fluticasone (FLONASE) 50 MCG/ACT nasal spray   Nasal   Place 2 sprays into the nose every morning.         . hydrOXYzine (ATARAX/VISTARIL) 10 MG tablet   Oral   Take 10-20 mg by mouth 3 (three) times daily as needed (For allergies or itching.).         Marland Kitchen ibuprofen (ADVIL,MOTRIN) 200 MG tablet   Oral   Take 800 mg by mouth every 6 (six) hours as needed (For aches and pain.).         Marland Kitchen ketotifen (ZADITOR) 0.025 % ophthalmic solution   Both Eyes   Place 1 drop into both eyes 2 (two) times daily as needed (For allergies.).         Marland Kitchen levocetirizine (XYZAL) 5 MG tablet   Oral   Take 5 mg by mouth every evening.          Marland Kitchen oxyCODONE-acetaminophen (PERCOCET) 5-325  MG per tablet   Oral   Take 1 tablet by mouth every 6 (six) hours as needed for pain.   15 tablet   0   . predniSONE (STERAPRED UNI-PAK) 5 MG TABS tablet   Oral   Take 5 mg by mouth taper from 4 doses each day to 1 dose and stop.         Marland Kitchen EPINEPHrine (EPIPEN) 0.3 mg/0.3 mL SOAJ injection   Intramuscular   Inject 0.3 mg into the muscle once.          BP 121/84  Pulse 102  Temp(Src) 98.1 F (36.7 C) (Oral)  Resp 16  SpO2 95%  Physical Exam  Nursing note and vitals reviewed. Constitutional: She is oriented to person, place, and time. She appears well-developed and well-nourished. No distress.  HENT:  Head: Normocephalic and atraumatic.  Right Ear: Tympanic membrane, external ear and ear canal normal.  Left Ear: Tympanic membrane, external ear and ear canal normal.  Mouth/Throat: Posterior oropharyngeal erythema present.  Eyes: EOM are normal.  Neck: Neck supple. No tracheal deviation present.  Cardiovascular: Normal rate, regular rhythm and normal heart  sounds.   Pulmonary/Chest: Effort normal. No respiratory distress. She has rales (right side).  Abdominal: Soft.  Generalized left sided tenderness with palpation  Musculoskeletal: Normal range of motion.  Neurological: She is alert and oriented to person, place, and time.  Skin: Skin is warm and dry.  Psychiatric: She has a normal mood and affect. Her behavior is normal.    ED Course  Procedures (including critical care time)  DIAGNOSTIC STUDIES: Oxygen Saturation is 95% on RA, adequate by my interpretation.    COORDINATION OF CARE: 6:56 PM-Discussed treatment plan which includes labs and chest xray with pt at bedside and pt agreed to plan.   Labs Review Labs Reviewed - No data to display Imaging Review No results found.  EKG Interpretation   None       MDM  No diagnosis found.  Dr. Romeo Apple to follow up with Labs and scan     I personally performed the services described in this documentation, which was scribed in my presence. The recorded information has been reviewed and is accurate.   Teressa Lower, NP 10/17/13 2021  Teressa Lower, NP 10/17/13 2028

## 2013-10-18 NOTE — ED Provider Notes (Signed)
Medical screening examination/treatment/procedure(s) were conducted as a shared visit with non-physician practitioner(s) and myself.  I personally evaluated the patient during the encounter.  EKG Interpretation   None       I interviewed and examined the patient. Lungs are CTAB. Cardiac exam wnl. Abdomen soft, mild LUQ abd pain. See my note for further details.   Junius Argyle, MD 10/18/13 [phone]

## 2013-10-19 ENCOUNTER — Encounter: Payer: Self-pay | Admitting: Emergency Medicine

## 2013-10-19 ENCOUNTER — Ambulatory Visit (INDEPENDENT_AMBULATORY_CARE_PROVIDER_SITE_OTHER): Payer: 59 | Admitting: Emergency Medicine

## 2013-10-19 VITALS — BP 122/88 | HR 88 | Temp 99.0°F | Ht 67.0 in | Wt 235.2 lb

## 2013-10-19 DIAGNOSIS — J189 Pneumonia, unspecified organism: Secondary | ICD-10-CM

## 2013-10-19 NOTE — Patient Instructions (Signed)
Please finish your prednisone and antibiotics as prescribed Use your cough syrup as directed  Continue your xyzal and fluticasone nasal spray Follow with Dr Delton Coombes next available appointment with full PFT on the same day.

## 2013-10-19 NOTE — Progress Notes (Signed)
Subjective:    Patient ID: Linda Buckley, female    DOB: June 04, 1956, 57 y.o.   MRN: [id number]  HPI 57 yo woman, never smoker, little PMH except allergies. Was seen on 10/27 in the ED with 2 weeks cough, some fever, fatigue. She was treated initially by Dr Zachery Dauer for bronchitis with azithro. CXR in the ED  On 10/27 with very mild LLL infiltrate, treated for CAP with augmentin. Then prednisone was added by Dr Zachery Dauer. She was not tested for flu. She developed some L abd pain > CT scan 11/2 was unremarkable and did not show the LLL infiltrate. She is improving. Still has nasal gtt, fatigue. She is having pleuritic type L sided pain. She is on flonase, xyzal, hydroxyzine prn.    Review of Systems  Constitutional: Negative for fever and unexpected weight change.  HENT: Positive for congestion and postnasal drip. Negative for dental problem, ear pain, nosebleeds, rhinorrhea, sinus pressure, sneezing, sore throat and trouble swallowing.   Eyes: Negative for redness and itching.  Respiratory: Positive for cough, chest tightness, shortness of breath and wheezing.   Cardiovascular: Negative for palpitations and leg swelling.  Gastrointestinal: Negative for nausea and vomiting.  Genitourinary: Negative for dysuria.  Musculoskeletal: Negative for joint swelling.  Skin: Positive for rash.       Hives  Neurological: Negative for headaches.  Hematological: Bruises/bleeds easily.  Psychiatric/Behavioral: Negative for dysphoric mood. The patient is not nervous/anxious.    No past medical history on file.   Family History  Problem Relation Age of Onset  . Asthma Son   . Asthma Father   . Cancer Maternal Grandfather      History   Social History  . Marital Status: Single    Spouse Name: N/A    Number of Children: N/A  . Years of Education: N/A   Occupational History  . 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 Smoking status: Never Smoker   . Smokeless tobacco: Not on  file  . Alcohol Use: Yes  . Drug Use: No  . Sexual Activity: Not on file   Other Topics Concern  . Not on file   Social History Narrative  . No narrative on file     Allergies  Allergen Reactions  . Aspirin     Sweating/ swelling     Outpatient Prescriptions Prior to Visit  Medication Sig Dispense Refill  . albuterol (PROVENTIL HFA;VENTOLIN HFA) 108 (90 BASE) MCG/ACT inhaler Inhale 2 puffs into the lungs every 4 (four) hours as needed for shortness of breath.      Marland Kitchen amoxicillin-clavulanate (AUGMENTIN) 875-125 MG per tablet Take 1 tablet by mouth 2 (two) times daily.  20 tablet  0  . EPINEPHrine (EPIPEN) 0.3 mg/0.3 mL SOAJ injection Inject 0.3 mg into the muscle once.      . fluticasone (FLONASE) 50 MCG/ACT nasal spray Place 2 sprays into the nose every morning.      . hydrOXYzine (ATARAX/VISTARIL) 10 MG tablet Take 10-20 mg by mouth 3 (three) times daily as needed (For allergies or itching.).      Marland Kitchen ibuprofen (ADVIL,MOTRIN) 200 MG tablet Take 800 mg by mouth every 6 (six) hours as needed (For aches and pain.).      Marland Kitchen ketotifen (ZADITOR) 0.025 % ophthalmic solution Place 1 drop into both eyes 2 (two) times daily as needed (For allergies.).      Marland Kitchen levocetirizine (XYZAL) 5 MG tablet Take 5 mg  by mouth every evening.       Marland Kitchen oxyCODONE-acetaminophen (PERCOCET) 5-325 MG per tablet Take 1 tablet by mouth every 6 (six) hours as needed for pain.  15 tablet  0  . predniSONE (STERAPRED UNI-PAK) 5 MG TABS tablet Take 5 mg by mouth taper from 4 doses each day to 1 dose and stop.      Marland Kitchen azithromycin (ZITHROMAX) 250 MG tablet Take 250 mg by mouth daily. 500 mg on day one 250 mg daily for 4 days       No facility-administered medications prior to visit.        Objective:   Physical Exam Filed Vitals:   10/19/13 1144  BP: 122/88  Pulse: 88  Temp: 99 F (37.2 C)  TempSrc: Oral  Height: 5\' 7"  (1.702 m)  Weight: 235 lb 3.2 oz (106.686 kg)  SpO2: 97%   Gen: Pleasant, well-nourished, in  no distress,  normal affect  ENT: No lesions,  mouth clear,  oropharynx clear, some posterior pharyngeal erythema, no postnasal drip  Neck: No JVD, no TMG, no carotid bruits  Lungs: No use of accessory muscles, clear without rales or rhonchi  Cardiovascular: RRR, heart sounds normal, no murmur or gallops, no peripheral edema  Musculoskeletal: No deformities, no cyanosis or clubbing  Neuro: alert, non focal  Skin: Warm, no lesions or rashes     Assessment & Plan:  CAP (community acquired pneumonia) Sounds like this started as a viral syndrome, possibly influenza, with a superimposed bronchitis. Her chest x-ray in the emergency department 10/27 may have shown a left lower lobe pneumonia. Her CT scan of the abdomen did not confirm an infiltrate. She has been treated as such and has slowly improved. I reassured her that the duration of her illness should the approximately 3 weeks and that she should be resolving. She continued to have cough and left-sided pleuritic pain. She has taken and possibly benefited from prednisone and a short acting beta agonist. Query whether she may have underlying mild asthma but has not been diagnosed.  - Continue supportive care - Finish the prednisone and antibiotics as ordered - Continued Xyzal, fluticasone spray. Consider adding nasal saline washes - full PFT at next visit

## 2013-10-19 NOTE — Assessment & Plan Note (Signed)
Sounds like this started as a viral syndrome, possibly influenza, with a superimposed bronchitis. Her chest x-ray in the emergency department 10/27 may have shown a left lower lobe pneumonia. Her CT scan of the abdomen did not confirm an infiltrate. She has been treated as such and has slowly improved. I reassured her that the duration of her illness should the approximately 3 weeks and that she should be resolving. She continued to have cough and left-sided pleuritic pain. She has taken and possibly benefited from prednisone and a short acting beta agonist. Query whether she may have underlying mild asthma but has not been diagnosed.  - Continue supportive care - Finish the prednisone and antibiotics as ordered - Continued Xyzal, fluticasone spray. Consider adding nasal saline washes - full PFT at next visit

## 2013-10-21 ENCOUNTER — Telehealth: Payer: Self-pay | Admitting: Emergency Medicine

## 2013-10-21 NOTE — Telephone Encounter (Signed)
Pt returned triage's call & can be reached at [phone].  Linda Buckley

## 2013-10-21 NOTE — Telephone Encounter (Signed)
lmomtcb x1 for pt 

## 2013-10-21 NOTE — Telephone Encounter (Signed)
LMTCBx2. Jennifer Castillo, CMA  

## 2013-10-21 NOTE — Telephone Encounter (Signed)
Pt saw RB on 10-19-13 and she is asking for a return to work note. She returned to work on 10-21-13. Letter only needs to state ok to return to work. Letter needs to be faxed to number given above. I do not see any mention of returning to work or not in last OV note. Dr. Delton Coombes please advise if ok to do letter. Carron Curie, CMA

## 2013-10-25 ENCOUNTER — Encounter: Payer: Self-pay | Admitting: *Deleted

## 2013-10-25 NOTE — Telephone Encounter (Signed)
Yes Ok to please write her a note

## 2013-10-25 NOTE — Telephone Encounter (Signed)
Letter has been written. Pt is aware that this will be faxed in for her.

## 2013-11-19 ENCOUNTER — Telehealth: Payer: Self-pay | Admitting: Emergency Medicine

## 2013-11-19 ENCOUNTER — Ambulatory Visit (INDEPENDENT_AMBULATORY_CARE_PROVIDER_SITE_OTHER): Payer: 59 | Admitting: Pulmonary Disease

## 2013-11-19 ENCOUNTER — Encounter: Payer: Self-pay | Admitting: Pulmonary Disease

## 2013-11-19 VITALS — BP 142/98 | HR 70 | Temp 98.1°F | Ht 67.0 in | Wt 232.0 lb

## 2013-11-19 DIAGNOSIS — R059 Cough, unspecified: Secondary | ICD-10-CM

## 2013-11-19 DIAGNOSIS — R05 Cough: Secondary | ICD-10-CM

## 2013-11-19 MED ORDER — PREDNISONE 10 MG PO TABS: ORAL_TABLET | ORAL | Status: DC

## 2013-11-19 MED ORDER — HYDROCOD POLST-CHLORPHEN POLST 10-8 MG/5ML PO LQCR: 5.0000 mL | Freq: Two times a day (BID) | ORAL | Status: DC | PRN

## 2013-11-19 NOTE — Telephone Encounter (Signed)
I called and spoke with pt. She is scheduled to come in and see BQ this afternoon. Nothing further eneded

## 2013-11-19 NOTE — Patient Instructions (Signed)
Take the prednisone taper as written Use tussionex every 12 hours as needed to help with the cough, but don't take it and then drive afterwards because it can make you sleepy Use the advair one puff twice a day no matter how you feel  Rest your voice; pick a day, don't talk, sing, laugh, or cough.  Use warm beverages or hard candies to help suppress the cough.    We will see you back in 2-3 weeks after the lung function tests (you can see me or Dr. Delton Coombes)

## 2013-11-19 NOTE — Progress Notes (Signed)
   Subjective:    Patient ID: Linda Buckley, female    DOB: 04-29-1956, 57 y.o.   MRN: [id number]  HPI  11/19/2013 ROV >> Fatema saw Dr. Delton Coombes last month for liely bronchitis and was treated with antiibotics after a visit to the Ed.  She states that she continues to have a nagging, all day, all night cough which has not stopped.  She has not had lung problems in the past.  She had occasionally smoked on a social setting but nothing on a regularly.  She also has wheezing and shortness of breath as well. Last night she couldn't sleep. Nothing has changed in her environment.  She has ben toldrecently that she has a lot of allergies based on allergy testing recently.  This has all started after she got a new job with the [employer].   No past medical history on file.   Review of Systems     Objective:   Physical Exam Filed Vitals:   11/19/13 1359  BP: 142/98  Pulse: 70  Temp: 98.1 F (36.7 C)  TempSrc: Oral  Height: 5\' 7"  (1.702 m)  Weight: 232 lb (105.235 kg)  SpO2: 96%   RA  Gen: no acute distress HEENT: NCAT, PERRL, OP clear PULM: Lower airway wheezing bilaterally CV: RRR, no mgr AB: soft, nontender Ext: warm, no cyanosis      Assessment & Plan:   Cough She is really wheezing a lot today.  Even though she is 57 years old, her recently diagnosed allergies make me concerned that this is asthma.  Plan: -keep appointment for PFT next week -prednisone taper -add Advair 250/50 sample bid -tussionex prn -voice rest -f/u with me or [facility] after PFT's    Updated Medication List Outpatient Encounter Prescriptions as of 11/19/2013  Medication Sig  . albuterol (PROVENTIL HFA;VENTOLIN HFA) 108 (90 BASE) MCG/ACT inhaler Inhale 2 puffs into the lungs every 4 (four) hours as needed for shortness of breath.  . EPINEPHrine (EPIPEN) 0.3 mg/0.3 mL SOAJ injection Inject 0.3 mg into the muscle once.  . fluticasone (FLONASE) 50 MCG/ACT nasal spray Place 2 sprays into the nose every  morning.  Marland Kitchen guaifenesin (ROBITUSSIN) 100 MG/5ML syrup Take 200 mg by mouth 3 (three) times daily as needed for cough.  . hydrOXYzine (ATARAX/VISTARIL) 10 MG tablet Take 10-20 mg by mouth 3 (three) times daily as needed (For allergies or itching.).  Marland Kitchen ibuprofen (ADVIL,MOTRIN) 200 MG tablet Take 800 mg by mouth every 6 (six) hours as needed (For aches and pain.).  Marland Kitchen ketotifen (ZADITOR) 0.025 % ophthalmic solution Place 1 drop into both eyes 2 (two) times daily as needed (For allergies.).  Marland Kitchen levocetirizine (XYZAL) 5 MG tablet Take 5 mg by mouth every evening.   Marland Kitchen oxyCODONE-acetaminophen (PERCOCET) 5-325 MG per tablet Take 1 tablet by mouth every 6 (six) hours as needed for pain.  . [DISCONTINUED] predniSONE (STERAPRED UNI-PAK) 5 MG TABS tablet Take 5 mg by mouth taper from 4 doses each day to 1 dose and stop.

## 2013-11-19 NOTE — Assessment & Plan Note (Signed)
She is really wheezing a lot today.  Even though she is 57 years old, her recently diagnosed allergies make me concerned that this is asthma.  Plan: -keep appointment for PFT next week -prednisone taper -add Advair 250/50 sample bid -tussionex prn -voice rest -f/u with me or [facility] after PFT's

## 2013-11-25 ENCOUNTER — Encounter (INDEPENDENT_AMBULATORY_CARE_PROVIDER_SITE_OTHER): Payer: Self-pay

## 2013-11-25 ENCOUNTER — Encounter: Payer: Self-pay | Admitting: Emergency Medicine

## 2013-11-25 ENCOUNTER — Ambulatory Visit (INDEPENDENT_AMBULATORY_CARE_PROVIDER_SITE_OTHER): Payer: 59 | Admitting: Emergency Medicine

## 2013-11-25 VITALS — BP 150/100 | HR 89 | Ht 67.0 in | Wt 232.0 lb

## 2013-11-25 DIAGNOSIS — R05 Cough: Secondary | ICD-10-CM

## 2013-11-25 DIAGNOSIS — R059 Cough, unspecified: Secondary | ICD-10-CM

## 2013-11-25 DIAGNOSIS — J189 Pneumonia, unspecified organism: Secondary | ICD-10-CM

## 2013-11-25 LAB — PULMONARY FUNCTION TEST
FEF 25-75 Pre: 2.85 L/sec
FEF2575-%Change-Post: -4 %
FEV1-%Change-Post: -2 %
FEV1-%Pred-Post: 82 %
FEV1-%Pred-Pre: 85 %
FEV1-Post: 2 L
FEV1-Pre: 2.05 L
FEV1FVC-%Change-Post: 1 %
FEV1FVC-%Pred-Pre: 109 %
FEV6-Post: 2.24 L
FEV6-Pre: 2.33 L
FEV6FVC-%Pred-Pre: 103 %
FVC-%Change-Post: -4 %
FVC-%Pred-Pre: 76 %
FVC-Post: 2.24 L
Post FEV1/FVC ratio: 89 %
Post FEV6/FVC ratio: 100 %
Pre FEV1/FVC ratio: 88 %
Pre FEV6/FVC Ratio: 100 %
RV % pred: 45 %
RV: 0.9 L
TLC % pred: 60 %
TLC: 3.25 L

## 2013-11-25 MED ORDER — ALBUTEROL SULFATE HFA 108 (90 BASE) MCG/ACT IN AERS: 2.0000 | INHALATION_SPRAY | RESPIRATORY_TRACT | Status: DC | PRN

## 2013-11-25 NOTE — Assessment & Plan Note (Signed)
Cough is improved, as is wheezing heard on exam last visit. Suspect being driven by allergies, either UA irritation or asthma (or both). Suspect UA disease is the driver given reassuring PFT.  - stop Advair for now - continue xyzal, hydroxyzine, nasal steroids - start allergy shots as planned - continue albuterol prn - follow 6 weeks. Depending on status may decide to check methacholine challenge.

## 2013-11-25 NOTE — Progress Notes (Signed)
PFT done today. 

## 2013-11-25 NOTE — Patient Instructions (Signed)
Please finish your prednisone as prescribed Continue your allergy medications as you are taking them Start getting your allergy shots Stop Advair for now Follow with Dr Delton Coombes in 6 weeks to review your symptoms with these changes.

## 2013-11-25 NOTE — Addendum Note (Signed)
Addended by: Velvet Bathe on: 11/25/2013 12:39 PM   Modules accepted: Orders

## 2013-11-25 NOTE — Progress Notes (Signed)
Subjective:    Patient ID: Linda Buckley, female    DOB: 07-Jan-1956, 57 y.o.   MRN: [id number]  HPI 57 yo woman, never smoker, little PMH except allergies. Was seen on 10/27 in the ED with 2 weeks cough, some fever, fatigue. She was treated initially by Dr Zachery Dauer for bronchitis with azithro. CXR in the ED  On 10/27 with very mild LLL infiltrate, treated for CAP with augmentin. Then prednisone was added by Dr Zachery Dauer. She was not tested for flu. She developed some L abd pain > CT scan 11/2 was unremarkable and did not show the LLL infiltrate. She is improving. Still has nasal gtt, fatigue. She is having pleuritic type L sided pain. She is on flonase, xyzal, hydroxyzine prn.   11/19/2013 ROV with BQ>> Zylie saw Dr. Delton Coombes last month for liely bronchitis and was treated with antiibotics after a visit to the Ed. She states that she continues to have a nagging, all day, all night cough which has not stopped. She has not had lung problems in the past. She had occasionally smoked on a social setting but nothing on a regularly. She also has wheezing and shortness of breath as well. Last night she couldn't sleep. Nothing has changed in her environment. She has ben toldrecently that she has a lot of allergies based on allergy testing recently. This has all started after she got a new job with the [employer].  ROV 11/25/13 -- follow up for allergies and cough, possible asthma. She has multiple allergies and is slated to restart allergy shots but hasn't started yet. I met her following acute illness treated as PNA in 10/'14. She saw Dr Kendrick Fries on 12/5 with continued cough and wheeze on exam. She received prednisone taped and a trial of Advair. PFT's were done today > more consistent with restriction. She is better w regard to cough. She remains on hydroxzine, xyzal, nasal steroid   Review of Systems  Constitutional: Negative for fever and unexpected weight change.  HENT: Positive for congestion and postnasal drip.  Negative for dental problem, ear pain, nosebleeds, rhinorrhea, sinus pressure, sneezing, sore throat and trouble swallowing.   Eyes: Negative for redness and itching.  Respiratory: Positive for cough, chest tightness, shortness of breath and wheezing.   Cardiovascular: Negative for palpitations and leg swelling.  Gastrointestinal: Negative for nausea and vomiting.  Genitourinary: Negative for dysuria.  Musculoskeletal: Negative for joint swelling.  Skin: Positive for rash.       Hives  Neurological: Negative for headaches.  Hematological: Bruises/bleeds easily.  Psychiatric/Behavioral: Negative for dysphoric mood. The patient is not nervous/anxious.       Objective:   Physical Exam Filed Vitals:   11/25/13 1201  BP: 150/100  Pulse: 89  Height: 5\' 7"  (1.702 m)  Weight: 232 lb (105.235 kg)  SpO2: 96%   Gen: Pleasant, well-nourished, in no distress,  normal affect  ENT: No lesions,  mouth clear,  oropharynx clear, some posterior pharyngeal erythema, no postnasal drip  Neck: No JVD, no TMG, no carotid bruits  Lungs: No use of accessory muscles, clear without rales or rhonchi  Cardiovascular: RRR, heart sounds normal, no murmur or gallops, no peripheral edema  Musculoskeletal: No deformities, no cyanosis or clubbing  Neuro: alert, non focal  Skin: Warm, no lesions or rashes     Assessment & Plan:  Cough Cough is improved, as is wheezing heard on exam last visit. Suspect being driven by allergies, either UA irritation or asthma (or both). Suspect UA  disease is the driver given reassuring PFT.  - stop Advair for now - continue xyzal, hydroxyzine, nasal steroids - start allergy shots as planned - continue albuterol prn - follow 6 weeks. Depending on status may decide to check methacholine challenge.

## 2014-01-06 ENCOUNTER — Ambulatory Visit: Payer: 59 | Admitting: Emergency Medicine

## 2014-01-11 ENCOUNTER — Telehealth: Payer: Self-pay

## 2014-01-11 NOTE — Telephone Encounter (Signed)
Unable to reach patient.  Invalid number.  Only one number listed.    Pap-Hx. Hysterectomy

## 2014-01-12 ENCOUNTER — Encounter (INDEPENDENT_AMBULATORY_CARE_PROVIDER_SITE_OTHER): Payer: Self-pay

## 2014-01-12 ENCOUNTER — Encounter: Payer: Self-pay | Admitting: Family Medicine

## 2014-01-12 ENCOUNTER — Ambulatory Visit (INDEPENDENT_AMBULATORY_CARE_PROVIDER_SITE_OTHER): Payer: 59 | Admitting: Family Medicine

## 2014-01-12 VITALS — BP 140/82 | HR 90 | Temp 98.4°F | Resp 16 | Ht 67.25 in | Wt 238.0 lb

## 2014-01-12 DIAGNOSIS — E669 Obesity, unspecified: Secondary | ICD-10-CM | POA: Insufficient documentation

## 2014-01-12 DIAGNOSIS — I10 Essential (primary) hypertension: Secondary | ICD-10-CM

## 2014-01-12 DIAGNOSIS — J9801 Acute bronchospasm: Secondary | ICD-10-CM

## 2014-01-12 DIAGNOSIS — R7303 Prediabetes: Secondary | ICD-10-CM | POA: Insufficient documentation

## 2014-01-12 DIAGNOSIS — Z840 Family history of diseases of the skin and subcutaneous tissue: Secondary | ICD-10-CM

## 2014-01-12 DIAGNOSIS — Z8489 Family history of other specified conditions: Secondary | ICD-10-CM

## 2014-01-12 DIAGNOSIS — R7309 Other abnormal glucose: Secondary | ICD-10-CM

## 2014-01-12 LAB — BASIC METABOLIC PANEL
BUN: 13 mg/dL (ref 6–23)
CALCIUM: 9.1 mg/dL (ref 8.4–10.5)
CHLORIDE: 107 meq/L (ref 96–112)
CO2: 28 meq/L (ref 19–32)
CREATININE: 0.8 mg/dL (ref 0.4–1.2)
GFR: 99.99 mL/min (ref >60.00)
Glucose, Bld: 94 mg/dL (ref 70–99)
Potassium: 3.1 mEq/L — ABNORMAL LOW (ref 3.5–5.1)
SODIUM: 139 meq/L (ref 135–145)

## 2014-01-12 LAB — CBC WITH DIFFERENTIAL/PLATELET
Basophils Absolute: 0 10*3/uL (ref 0.0–0.1)
Basophils Relative: 0.4 % (ref 0.0–3.0)
EOS ABS: 0.4 10*3/uL (ref 0.0–0.7)
Eosinophils Relative: 8.4 % — ABNORMAL HIGH (ref 0.0–5.0)
HCT: 39.3 % (ref 36.0–46.0)
Hemoglobin: 12.5 g/dL (ref 12.0–15.0)
LYMPHS PCT: 27.8 % (ref 12.0–46.0)
Lymphs Abs: 1.2 10*3/uL (ref 0.7–4.0)
MCHC: 31.7 g/dL (ref 30.0–36.0)
MCV: 94.2 fl (ref 78.0–100.0)
MONOS PCT: 6 % (ref 3.0–12.0)
Monocytes Absolute: 0.3 10*3/uL (ref 0.1–1.0)
Neutro Abs: 2.5 10*3/uL (ref 1.4–7.7)
Neutrophils Relative %: 57.4 % (ref 43.0–77.0)
PLATELETS: 223 10*3/uL (ref 150.0–400.0)
RBC: 4.17 Mil/uL (ref 3.87–5.11)
RDW: 13.2 % (ref 11.5–14.6)
WBC: 4.3 10*3/uL — ABNORMAL LOW (ref 4.5–10.5)

## 2014-01-12 LAB — HEPATIC FUNCTION PANEL
ALK PHOS: 65 U/L (ref 39–117)
ALT: 15 U/L (ref 0–35)
AST: 17 U/L (ref 0–37)
Albumin: 3.6 g/dL (ref 3.5–5.2)
Bilirubin, Direct: 0 mg/dL (ref 0.0–0.3)
TOTAL PROTEIN: 6.8 g/dL (ref 6.0–8.3)
Total Bilirubin: 0.4 mg/dL (ref 0.3–1.2)

## 2014-01-12 LAB — LIPID PANEL
CHOL/HDL RATIO: 3
CHOLESTEROL: 168 mg/dL (ref 0–200)
HDL: 59 mg/dL (ref >39.00)
LDL Cholesterol: 91 mg/dL (ref 0–99)
TRIGLYCERIDES: 91 mg/dL (ref 0.0–149.0)
VLDL: 18.2 mg/dL (ref 0.0–40.0)

## 2014-01-12 LAB — HEMOGLOBIN A1C: Hgb A1c MFr Bld: 5.8 % (ref 4.6–6.5)

## 2014-01-12 LAB — TSH: TSH: 0.63 u[IU]/mL (ref 0.35–5.50)

## 2014-01-12 MED ORDER — BECLOMETHASONE DIPROPIONATE 80 MCG/ACT IN AERS: 1.0000 | INHALATION_SPRAY | Freq: Two times a day (BID) | RESPIRATORY_TRACT | Status: DC

## 2014-01-12 MED ORDER — HYDROCHLOROTHIAZIDE 12.5 MG PO TABS: 12.5000 mg | ORAL_TABLET | Freq: Every day | ORAL | Status: DC

## 2014-01-12 MED ORDER — IPRATROPIUM-ALBUTEROL 0.5-2.5 (3) MG/3ML IN SOLN
3.0000 mL | Freq: Once | RESPIRATORY_TRACT | Status: AC
  Administered 2014-01-12: 3 mL via RESPIRATORY_TRACT

## 2014-01-12 NOTE — Assessment & Plan Note (Signed)
New.  Check ANA to r/o rheumatologic process

## 2014-01-12 NOTE — Assessment & Plan Note (Signed)
New to provider, ongoing for pt.  Encouraged regular, aerobic activity for 30 minutes at least 4x/week and monitoring caloric intake w/ the help of [employer] app.

## 2014-01-12 NOTE — Patient Instructions (Signed)
Schedule your complete physical in 4-6 weeks Start the HCTZ daily for the BP and swelling Try and make healthy food choices and get regular exercise If your wheezing or cough worsens- please call pulmonary for a sooner appt Call with any questions or concerns Welcome!  We're glad to have you!

## 2014-01-12 NOTE — Progress Notes (Signed)
Pre visit review using our clinic review tool, if applicable. No additional management support is needed unless otherwise documented below in the visit note. 

## 2014-01-12 NOTE — Assessment & Plan Note (Signed)
New to provider.  Currently following w/ pulm but wheezing in office today.  SOB and wheezing improved s/p neb tx.  Sample of Qvar 80mg  given.  Pt to f/u w/ pulm as scheduled.

## 2014-01-12 NOTE — Assessment & Plan Note (Signed)
New to provider, hx of this per pt.  Pt remains obese- stressed importance of healthy diet and regular exercise.  Check labs to risk stratify.  Will follow.

## 2014-01-12 NOTE — Assessment & Plan Note (Signed)
New.  BP has been only mildly elevated and this may be due to ongoing physical stress.  Also having LE edema.  Start HCTZ.  Will follow.

## 2014-01-12 NOTE — Progress Notes (Signed)
   Subjective:    Patient ID: Linda Buckley, female    DOB: October 30, 1956, 58 y.o.   MRN: [id number]  HPI New to establish.  Previous MD- [facility] Latino, [facility]  Elevated BP- pt reports BP has been elevated since getting sick 'a couple of months ago'.  Denies CP.  + SOB and wheezing associated w/ pulmonary issues.  + family hx of HTN.  + edema of hands/feet- improves w/ elevation.  Pre-diabetes- pt was told this 'years ago'.  Told it was directly related to weight.  Not exercising.  Not following any particular diet- attempts healthy choices.  Doesn't always eat regularly due to schedule.  Family hx of lupus- pt has had chronic cough for months, has not told Pulm about possible lupus.  Wheezing in office today.  Has f/u w/ pulm in 2 weeks.  Health maintenance- last pap 'a couple of years ago'.  Due for mammo.  Never had colonoscopy.   Review of Systems For ROS see HPI     Objective:   Physical Exam  Vitals reviewed. Constitutional: She is oriented to person, place, and time. She appears well-developed and well-nourished. No distress.  obese  HENT:  Head: Normocephalic and atraumatic.  Eyes: Conjunctivae and EOM are normal. Pupils are equal, round, and reactive to light.  Neck: Normal range of motion. Neck supple. No thyromegaly present.  Cardiovascular: Normal rate, regular rhythm, normal heart sounds and intact distal pulses.   No murmur heard. Pulmonary/Chest: Effort normal. No respiratory distress. She has wheezes (diffuse inspiratory and expiratory wheezes- inspiratory wheezes cleared and expiratory wheezes improved w/ neb tx).  Abdominal: Soft. She exhibits no distension. There is no tenderness.  Musculoskeletal: She exhibits no edema.  Lymphadenopathy:    She has no cervical adenopathy.  Neurological: She is alert and oriented to person, place, and time.  Skin: Skin is warm and dry.  Psychiatric: She has a normal mood and affect. Her  behavior is normal.          Assessment & Plan:

## 2014-01-13 LAB — ANA: ANA: NEGATIVE

## 2014-01-14 ENCOUNTER — Telehealth: Payer: Self-pay | Admitting: Family Medicine

## 2014-01-14 ENCOUNTER — Encounter: Payer: Self-pay | Admitting: General Practice

## 2014-01-14 ENCOUNTER — Other Ambulatory Visit: Payer: Self-pay | Admitting: General Practice

## 2014-01-14 MED ORDER — POTASSIUM CHLORIDE CRYS ER 20 MEQ PO TBCR: 20.0000 meq | EXTENDED_RELEASE_TABLET | Freq: Every day | ORAL | Status: DC

## 2014-01-14 NOTE — Telephone Encounter (Signed)
Relevant patient education assigned to patient using Emmi. ° °

## 2014-01-14 NOTE — Telephone Encounter (Signed)
Unable to reach patient pre visit.  

## 2014-01-27 ENCOUNTER — Ambulatory Visit: Payer: 59 | Admitting: Emergency Medicine

## 2014-02-28 ENCOUNTER — Telehealth: Payer: Self-pay

## 2014-02-28 NOTE — Telephone Encounter (Signed)
Left message for call back Non-identifiable   Pap-01/13/12-negative, hx. Hysterectomy [facility]- [facility]-  Flu Td-

## 2014-03-01 ENCOUNTER — Encounter: Payer: Self-pay | Admitting: Gastroenterology

## 2014-03-01 ENCOUNTER — Ambulatory Visit (INDEPENDENT_AMBULATORY_CARE_PROVIDER_SITE_OTHER): Payer: 59 | Admitting: Family Medicine

## 2014-03-01 ENCOUNTER — Encounter: Payer: Self-pay | Admitting: Family Medicine

## 2014-03-01 VITALS — BP 134/84 | HR 62 | Temp 98.2°F | Resp 16 | Ht 68.0 in | Wt 236.0 lb

## 2014-03-01 DIAGNOSIS — Z1231 Encounter for screening mammogram for malignant neoplasm of breast: Secondary | ICD-10-CM

## 2014-03-01 DIAGNOSIS — Z1211 Encounter for screening for malignant neoplasm of colon: Secondary | ICD-10-CM

## 2014-03-01 DIAGNOSIS — Z0000 Encounter for general adult medical examination without abnormal findings: Secondary | ICD-10-CM

## 2014-03-01 LAB — BASIC METABOLIC PANEL
BUN: 11 mg/dL (ref 6–23)
CALCIUM: 9.4 mg/dL (ref 8.4–10.5)
CO2: 30 meq/L (ref 19–32)
Chloride: 104 mEq/L (ref 96–112)
Creatinine, Ser: 0.8 mg/dL (ref 0.4–1.2)
GFR: 103.03 mL/min (ref >60.00)
GLUCOSE: 100 mg/dL — AB (ref 70–99)
Potassium: 3.6 mEq/L (ref 3.5–5.1)
Sodium: 139 mEq/L (ref 135–145)

## 2014-03-01 LAB — CBC WITH DIFFERENTIAL/PLATELET
Basophils Absolute: 0 10*3/uL (ref 0.0–0.1)
Basophils Relative: 0.7 % (ref 0.0–3.0)
EOS ABS: 0.1 10*3/uL (ref 0.0–0.7)
Eosinophils Relative: 3 % (ref 0.0–5.0)
HCT: 37 % (ref 36.0–46.0)
HEMOGLOBIN: 12.3 g/dL (ref 12.0–15.0)
LYMPHS PCT: 26.5 % (ref 12.0–46.0)
Lymphs Abs: 1 10*3/uL (ref 0.7–4.0)
MCHC: 33.2 g/dL (ref 30.0–36.0)
MCV: 90.8 fl (ref 78.0–100.0)
MONOS PCT: 6.3 % (ref 3.0–12.0)
Monocytes Absolute: 0.2 10*3/uL (ref 0.1–1.0)
NEUTROS ABS: 2.3 10*3/uL (ref 1.4–7.7)
NEUTROS PCT: 63.5 % (ref 43.0–77.0)
Platelets: 231 10*3/uL (ref 150.0–400.0)
RBC: 4.07 Mil/uL (ref 3.87–5.11)
RDW: 12.9 % (ref 11.5–14.6)
WBC: 3.6 10*3/uL — ABNORMAL LOW (ref 4.5–10.5)

## 2014-03-01 LAB — HEPATIC FUNCTION PANEL
ALBUMIN: 3.9 g/dL (ref 3.5–5.2)
ALT: 16 U/L (ref 0–35)
AST: 16 U/L (ref 0–37)
Alkaline Phosphatase: 57 U/L (ref 39–117)
Bilirubin, Direct: 0 mg/dL (ref 0.0–0.3)
TOTAL PROTEIN: 6.9 g/dL (ref 6.0–8.3)
Total Bilirubin: 0.5 mg/dL (ref 0.3–1.2)

## 2014-03-01 LAB — LIPID PANEL
CHOLESTEROL: 158 mg/dL (ref 0–200)
HDL: 62.1 mg/dL (ref >39.00)
LDL Cholesterol: 83 mg/dL (ref 0–99)
Total CHOL/HDL Ratio: 3
Triglycerides: 66 mg/dL (ref 0.0–149.0)
VLDL: 13.2 mg/dL (ref 0.0–40.0)

## 2014-03-01 LAB — TSH: TSH: 0.57 u[IU]/mL (ref 0.35–5.50)

## 2014-03-01 NOTE — Progress Notes (Signed)
Pre visit review using our clinic review tool, if applicable. No additional management support is needed unless otherwise documented below in the visit note. 

## 2014-03-01 NOTE — Progress Notes (Signed)
   Subjective:    Patient ID: Linda Buckley, female    DOB: 07-03-1956, 58 y.o.   MRN: [id number]  HPI CPE- UTD on pap smear, due for mammo (has only had 1), has never had colonoscopy.     Review of Systems Patient reports no vision/ hearing changes, adenopathy,fever, weight change,  persistant/recurrent hoarseness , swallowing issues, chest pain, palpitations, edema, persistant/recurrent cough, hemoptysis, dyspnea (rest/exertional/paroxysmal nocturnal), gastrointestinal bleeding (melena, rectal bleeding), abdominal pain, significant heartburn, bowel changes, GU symptoms (dysuria, hematuria, incontinence), Gyn symptoms (abnormal  bleeding, pain),  syncope, focal weakness, memory loss, numbness & tingling, skin/hair/nail changes, abnormal bruising or bleeding, anxiety, or depression.     Objective:   Physical Exam General Appearance:    Alert, cooperative, no distress, appears stated age  Head:    Normocephalic, without obvious abnormality, atraumatic  Eyes:    PERRL, conjunctiva/corneas clear, EOM's intact, fundi    benign, both eyes  Ears:    Normal TM's and external ear canals, both ears  Nose:   Nares normal, septum midline, mucosa normal, no drainage    or sinus tenderness  Throat:   Lips, mucosa, and tongue normal; teeth and gums normal  Neck:   Supple, symmetrical, trachea midline, no adenopathy;    Thyroid: no enlargement/tenderness/nodules  Back:     Symmetric, no curvature, ROM normal, no CVA tenderness  Lungs:     Clear to auscultation bilaterally, respirations unlabored  Chest Wall:    No tenderness or deformity   Heart:    Regular rate and rhythm, S1 and S2 normal, no murmur, rub   or gallop  Breast Exam:    Deferred to mammo  Abdomen:     Soft, non-tender, bowel sounds active all four quadrants,    no masses, no organomegaly  Genitalia:    Deferred to GYN  Rectal:    Extremities:   Extremities normal, atraumatic, no cyanosis or edema  Pulses:   2+ and symmetric all  extremities  Skin:   Skin color, texture, turgor normal, excoriations noted on arms  Lymph nodes:   Cervical, supraclavicular, and axillary nodes normal  Neurologic:   CNII-XII intact, normal strength, sensation and reflexes    throughout          Assessment & Plan:

## 2014-03-01 NOTE — Patient Instructions (Signed)
Follow up in 6 months to recheck BP We'll notify you of your lab results and make any changes We'll call you with your mammogram and colonoscopy appts Keep up the good work!  You look great! Call with any questions or concerns Happy Spring!

## 2014-03-01 NOTE — Assessment & Plan Note (Signed)
Pt's PE WNL.  Due for mammo and colonoscopy- referrals entered.  UTD on pap.  Check labs.  Anticipatory guidance provided.

## 2014-03-02 ENCOUNTER — Encounter: Payer: Self-pay | Admitting: General Practice

## 2014-03-02 NOTE — Telephone Encounter (Signed)
Unable to reach pre visit.  

## 2014-03-04 ENCOUNTER — Encounter: Payer: Self-pay | Admitting: General Practice

## 2014-03-04 LAB — VITAMIN D 1,25 DIHYDROXY
VITAMIN D3 1, 25 (OH): 70 pg/mL
Vitamin D 1, 25 (OH)2 Total: 70 pg/mL (ref 18–72)

## 2014-03-24 ENCOUNTER — Ambulatory Visit
Admission: RE | Admit: 2014-03-24 | Discharge: 2014-03-24 | Disposition: A | Discharge status: Home / Self Care | Payer: 59 | Source: Ambulatory Visit | Attending: Family Medicine | Admitting: Family Medicine

## 2014-03-24 DIAGNOSIS — Z1231 Encounter for screening mammogram for malignant neoplasm of breast: Secondary | ICD-10-CM

## 2014-04-01 ENCOUNTER — Ambulatory Visit (AMBULATORY_SURGERY_CENTER): Payer: 59

## 2014-04-01 VITALS — Ht 67.0 in | Wt 237.0 lb

## 2014-04-01 DIAGNOSIS — Z800 Family history of malignant neoplasm of digestive organs: Secondary | ICD-10-CM

## 2014-04-01 MED ORDER — MOVIPREP 100 G PO SOLR: 1.0000 | Freq: Once | ORAL | Status: DC

## 2014-04-01 NOTE — Progress Notes (Signed)
Not allergic to eggs or soy. No home O2 No taking diet/weight loss meds. Emmi instructions given for colonoscopy.  Link sent to email.

## 2014-04-06 ENCOUNTER — Encounter: Payer: Self-pay | Admitting: Gastroenterology

## 2014-04-15 ENCOUNTER — Ambulatory Visit (AMBULATORY_SURGERY_CENTER): Payer: 59 | Admitting: Gastroenterology

## 2014-04-15 ENCOUNTER — Encounter: Payer: Self-pay | Admitting: Gastroenterology

## 2014-04-15 VITALS — BP 140/70 | HR 44 | Temp 98.0°F | Resp 29 | Ht 67.0 in | Wt 237.0 lb

## 2014-04-15 DIAGNOSIS — Z800 Family history of malignant neoplasm of digestive organs: Secondary | ICD-10-CM

## 2014-04-15 DIAGNOSIS — Z1211 Encounter for screening for malignant neoplasm of colon: Secondary | ICD-10-CM

## 2014-04-15 DIAGNOSIS — D126 Benign neoplasm of colon, unspecified: Secondary | ICD-10-CM

## 2014-04-15 HISTORY — PX: COLONOSCOPY W/ POLYPECTOMY: SHX1380

## 2014-04-15 MED ORDER — SODIUM CHLORIDE 0.9 % IV SOLN: 500.0000 mL | INTRAVENOUS | Status: DC

## 2014-04-15 NOTE — Patient Instructions (Signed)
YOU HAD AN ENDOSCOPIC PROCEDURE TODAY AT THE [facility]: Refer to the procedure report that was given to you for any specific questions about what was found during the examination.  If the procedure report does not answer your questions, please call your gastroenterologist to clarify.  If you requested that your care partner not be given the details of your procedure findings, then the procedure report has been included in a sealed envelope for you to review at your convenience later.  YOU SHOULD EXPECT: Some feelings of bloating in the abdomen. Passage of more gas than usual.  Walking can help get rid of the air that was put into your GI tract during the procedure and reduce the bloating. If you had a lower endoscopy (such as a colonoscopy or flexible sigmoidoscopy) you may notice spotting of blood in your stool or on the toilet paper. If you underwent a bowel prep for your procedure, then you may not have a normal bowel movement for a few days.  DIET: Your first meal following the procedure should be a light meal and then it is ok to progress to your normal diet.  A half-sandwich or bowl of soup is an example of a good first meal.  Heavy or fried foods are harder to digest and may make you feel nauseous or bloated.  Likewise meals heavy in dairy and vegetables can cause extra gas to form and this can also increase the bloating.  Drink plenty of fluids but you should avoid alcoholic beverages for 24 hours.  ACTIVITY: Your care partner should take you home directly after the procedure.  You should plan to take it easy, moving slowly for the rest of the day.  You can resume normal activity the day after the procedure however you should NOT DRIVE or use heavy machinery for 24 hours (because of the sedation medicines used during the test).    SYMPTOMS TO REPORT IMMEDIATELY: A gastroenterologist can be reached at any hour.  During normal business hours, 8:30 AM to 5:00 PM Monday through Friday,  call [phone].  After hours and on weekends, please call the GI answering service at [phone] who will take a message and have the physician on call contact you.   Following lower endoscopy (colonoscopy or flexible sigmoidoscopy):  Excessive amounts of blood in the stool  Significant tenderness or worsening of abdominal pains  Swelling of the abdomen that is new, acute  Fever of 100F or higher  FOLLOW UP: If any biopsies were taken you will be contacted by phone or by letter within the next 1-3 weeks.  Call your gastroenterologist if you have not heard about the biopsies in 3 weeks.  Our staff will call the home number listed on your records the next business day following your procedure to check on you and address any questions or concerns that you may have at that time regarding the information given to you following your procedure. This is a courtesy call and so if there is no answer at the home number and we have not heard from you through the emergency physician on call, we will assume that you have returned to your regular daily activities without incident.  SIGNATURES/CONFIDENTIALITY: You and/or your care partner have signed paperwork which will be entered into your electronic medical record.  These signatures attest to the fact that that the information above on your After Visit Summary has been reviewed and is understood.  Full responsibility of the confidentiality of this   discharge information lies with you and/or your care-partner.  Recommendations If the polyp(s) removed today are adenomatous (pre-cancerous), you will need a repeat colonoscopy in 5 years. Otherwise 10 years for "routine risk" patients.  Await pathology results, letter in 1-2 weeks, please call office if you have not received report after 3 weeks.

## 2014-04-15 NOTE — Progress Notes (Signed)
A/ox3 pleased with MAC, report to Wendy RN 

## 2014-04-15 NOTE — Progress Notes (Signed)
Called to room to assist during endoscopic procedure.  Patient ID and intended procedure confirmed with present staff. Received instructions for my participation in the procedure from the performing physician.  

## 2014-04-15 NOTE — Op Note (Signed)
[place]  [address] [place], [id number]   COLONOSCOPY PROCEDURE REPORT  PATIENT: Linda Buckley, Linda Buckley  MR#: [id number] BIRTHDATE: [birth year] , 63  yrs. old GENDER: Female ENDOSCOPIST: Milus Banister, MD REFERRED YS:[id number] Birdie Riddle, M.D. PROCEDURE DATE:  04/15/2014 PROCEDURE:   Colonoscopy with snare polypectomy First Screening Colonoscopy - Avg.  risk and is 50 yrs.  old or older Yes.  Prior Negative Screening - Now for repeat screening. N/A  History of Adenoma - Now for follow-up colonoscopy & has been > or = to 3 yrs.  N/A  Polyps Removed Today? Yes. ASA CLASS:   Class II INDICATIONS:average risk screening. MEDICATIONS: MAC sedation, administered by CRNA and Propofol (Diprivan) 180 mg IV  DESCRIPTION OF PROCEDURE:   After the risks benefits and alternatives of the procedure were thoroughly explained, informed consent was obtained.  A digital rectal exam revealed no abnormalities of the rectum.   The LB [id number]  endoscope was introduced through the anus and advanced to the cecum, which was identified by both the appendix and ileocecal valve. No adverse events experienced.   The quality of the prep was excellent.  The instrument was then slowly withdrawn as the colon was fully examined.   COLON FINDINGS: One polyp was found, removed and sent to pathology. This was sessile, 72mm across, located in transverse segment, removed with cold snare.  The examination was otherwise normal. Retroflexed views revealed no abnormalities. The time to cecum=2 minutes 02 seconds.  Withdrawal time=8 minutes 49 seconds.  The scope was withdrawn and the procedure completed. COMPLICATIONS: There were no complications.  ENDOSCOPIC IMPRESSION: One polyp was found, removed and sent to pathology. The examination was otherwise normal.  RECOMMENDATIONS: If the polyp(s) removed today are proven to be adenomatous (pre-cancerous) polyps, you will need a repeat  colonoscopy in 5 years.  Otherwise you should continue to follow colorectal cancer screening guidelines for "routine risk" patients with colonoscopy in 10 years.  You will receive a letter within 1-2 weeks with the results of your biopsy as well as final recommendations.  Please call my office if you have not received a letter after 3 weeks.   eSigned:  Milus Banister, MD 04/15/2014 10:45 AM

## 2014-04-18 ENCOUNTER — Telehealth: Payer: Self-pay | Admitting: *Deleted

## 2014-04-18 NOTE — Telephone Encounter (Signed)
  Follow up Call-  Call back number 04/15/2014  Post procedure Call Back phone  # [phone]  Permission to leave phone message Yes     Patient questions:  Do you have a fever, pain , or abdominal swelling? no Pain Score  0 *  Have you tolerated food without any problems? yes  Have you been able to return to your normal activities? yes  Do you have any questions about your discharge instructions: Diet   no Medications  no Follow up visit  no  Do you have questions or concerns about your Care? no  Actions: * If pain score is 4 or above: No action needed, pain <4.

## 2014-04-19 ENCOUNTER — Encounter: Payer: Self-pay | Admitting: Gastroenterology

## 2014-09-01 ENCOUNTER — Ambulatory Visit: Payer: 59 | Admitting: Family Medicine

## 2014-09-08 ENCOUNTER — Ambulatory Visit: Payer: 59 | Admitting: Family Medicine

## 2014-10-15 ENCOUNTER — Ambulatory Visit (HOSPITAL_COMMUNITY)
Admission: RE | Admit: 2014-10-15 | Discharge: 2014-10-15 | Disposition: A | Discharge status: Home / Self Care | Payer: 59 | Source: Ambulatory Visit | Attending: Emergency Medicine | Admitting: Emergency Medicine

## 2014-10-15 ENCOUNTER — Ambulatory Visit (INDEPENDENT_AMBULATORY_CARE_PROVIDER_SITE_OTHER): Payer: 59 | Admitting: Emergency Medicine

## 2014-10-15 ENCOUNTER — Telehealth: Payer: Self-pay

## 2014-10-15 VITALS — BP 163/100 | HR 57 | Temp 98.4°F | Resp 16 | Ht 66.5 in | Wt 236.0 lb

## 2014-10-15 DIAGNOSIS — R519 Headache, unspecified: Secondary | ICD-10-CM

## 2014-10-15 DIAGNOSIS — R51 Headache: Secondary | ICD-10-CM | POA: Insufficient documentation

## 2014-10-15 MED ORDER — ONDANSETRON 8 MG PO TBDP: 8.0000 mg | ORAL_TABLET | Freq: Three times a day (TID) | ORAL | Status: DC | PRN

## 2014-10-15 MED ORDER — HYDROCODONE-ACETAMINOPHEN 5-325 MG PO TABS: 1.0000 | ORAL_TABLET | Freq: Four times a day (QID) | ORAL | Status: DC | PRN

## 2014-10-15 NOTE — Telephone Encounter (Signed)
Clld pt - advsd of CT results and to take medication prescribed by physician as advised. Pt stated she understood.

## 2014-10-15 NOTE — Progress Notes (Addendum)
This chart was scribed for Wardell Honour, MD by Einar Pheasant, ED Scribe. This patient was seen in room 10 and the patient's care was started at 12:02 PM.  Subjective:    Patient ID: Linda Buckley, female    DOB: 1955-12-25, 58 y.o.   MRN: [id number]  Chief Complaint  Patient presents with  . Headache    x 3 days with nausea, light headed and with light sensitivity    HPI Linda Buckley is a 58 y.o. female  Pt presents to the office today complaining of a gradual onset worsening right frontal headache that started 3 days ago. Pt is also complaining of associated nausea, light-headedness, and photophobia. She currently rates her headache as a "8/10". Pt does endorse a hx of headaches about 15 years ago, but she states that they went away on their own. She  headaches have returned after taking her BP medication. Pt states that she is more concerned about the light-headedness. She reports taking 4 Ibuprofen and BP medication. Linda Buckley also reports numbness to her right hand, blurry vision, and numbness of her right leg. She states that she has tripped several times over the right leg.   She denies any fever, chills, abdominal pain, chest pain, SOB, congestion, confusion, or myalgias.   Patient Active Problem List   Diagnosis Date Noted  . Routine general medical examination at a health care facility 03/01/2014  . HTN (hypertension) 01/12/2014  . Obesity (BMI 30-39.9) 01/12/2014  . Pre-diabetes 01/12/2014  . Family history of lupus erythematosus 01/12/2014  . Cough due to bronchospasm 01/12/2014  . Cough 11/19/2013  . Abdominal pain 10/17/2013  . Fatigue 10/17/2013  . CAP (community acquired pneumonia) 10/11/2013   Past Medical History  Diagnosis Date  . Allergy   . History of kidney stones   . Hx of migraines   . UTI (lower urinary tract infection)   . History of chicken pox    Past Surgical History  Procedure Laterality Date  . Abdominal hysterectomy      2005    . Tubal ligation      1982  . Carpectomy hand      2007   Allergies  Allergen Reactions  . Aspirin     Sweating/ swelling   Prior to Admission medications   Medication Sig Start Date End Date Taking? Authorizing Provider  hydrochlorothiazide (HYDRODIURIL) 12.5 MG tablet Take 1 tablet (12.5 mg total) by mouth daily. 01/12/14  Yes Midge Minium, MD  ibuprofen (ADVIL,MOTRIN) 200 MG tablet Take 800 mg by mouth every 6 (six) hours as needed (For aches and pain.).   Yes Historical Provider, MD  albuterol (PROVENTIL HFA;VENTOLIN HFA) 108 (90 BASE) MCG/ACT inhaler Inhale 2 puffs into the lungs every 4 (four) hours as needed for shortness of breath. 11/25/13   Collene Gobble, MD  beclomethasone (QVAR) 80 MCG/ACT inhaler Inhale 1 puff into the lungs 2 (two) times daily. 01/12/14   Midge Minium, MD  EPINEPHrine (EPIPEN) 0.3 mg/0.3 mL SOAJ injection Inject 0.3 mg into the muscle once.    Historical Provider, MD  fluticasone (FLONASE) 50 MCG/ACT nasal spray Place 2 sprays into the nose every morning.    Historical Provider, MD  hydrOXYzine (ATARAX/VISTARIL) 10 MG tablet Take 10-20 mg by mouth 3 (three) times daily as needed (For allergies or itching.).    Historical Provider, MD  ketotifen (ZADITOR) 0.025 % ophthalmic solution Place 1 drop into both eyes 2 (two) times daily as  needed (For allergies.).    Historical Provider, MD  levocetirizine (XYZAL) 5 MG tablet Take 5 mg by mouth every evening.     Historical Provider, MD   History   Social History  . Marital Status: Married    Spouse Name: N/A    Number of Children: N/A  . Years of Education: N/A   Occupational History  . [employer]     Dance movement psychotherapist   Social History Main Topics  . Smoking status: Never Smoker   . Smokeless tobacco: Never Used  . Alcohol Use: Yes     Comment: 2 glasses of wine weekly  . Drug Use: No  . Sexual Activity: Not on file   Other Topics Concern  . Not on file   Social History Narrative  . No  narrative on file   Review of Systems  Constitutional: Negative for appetite change and fatigue.  HENT: Negative for congestion, ear discharge and sinus pressure.   Eyes: Positive for photophobia. Negative for discharge.  Respiratory: Negative for cough.   Cardiovascular: Negative for chest pain.  Gastrointestinal: Negative for abdominal pain and diarrhea.  Genitourinary: Negative for frequency and hematuria.  Musculoskeletal: Negative for back pain.  Skin: Negative for rash.  Neurological: Positive for light-headedness and headaches. Negative for seizures.  Psychiatric/Behavioral: Negative for hallucinations.   Objective:   Physical Exam  Nursing note and vitals reviewed. Constitutional: She is oriented to person, place, and time. She appears well-developed and well-nourished. No distress.  Tearful but cooperative female.  HENT:  Head: Normocephalic and atraumatic.  Eyes: Conjunctivae and EOM are normal. Pupils are equal, round, and reactive to light. Right eye exhibits no discharge. Left eye exhibits no discharge.  Neck: Neck supple.  Cardiovascular: Normal rate, regular rhythm and normal heart sounds.  Exam reveals no gallop and no friction rub.   No murmur heard. BP was rechecked during PE: 140/90.  Pulmonary/Chest: Effort normal and breath sounds normal. No respiratory distress.  Abdominal: Soft. She exhibits no distension. There is no tenderness.  Musculoskeletal: She exhibits no edema and no tenderness.  Neurological: She is alert and oriented to person, place, and time. She has normal reflexes. She displays normal reflexes. No cranial nerve deficit. She exhibits normal muscle tone. Coordination normal.  Skin: Skin is warm and dry.  Psychiatric: She has a normal mood and affect. Her behavior is normal. Thought content normal.    Assessment & Plan:   Patient seen with right-sided headache. She also has associated photosensitivity of the right nausea and a vague numbness of  her right arm and right leg. She is under quite a bit of stress at home secondary to issues with her son.. No Toradol given because patient has an aspirin allergy. Her repeat blood pressure was 140/90.  I personally performed the services described in this documentation, which was scribed in my presence. The recorded information has been reviewed and is accurate.

## 2014-10-15 NOTE — Patient Instructions (Signed)

## 2014-10-20 ENCOUNTER — Ambulatory Visit: Payer: 59 | Admitting: Family Medicine

## 2015-02-28 ENCOUNTER — Other Ambulatory Visit: Payer: Self-pay | Admitting: Family Medicine

## 2015-02-28 ENCOUNTER — Ambulatory Visit (INDEPENDENT_AMBULATORY_CARE_PROVIDER_SITE_OTHER): Payer: 59 | Admitting: Family Medicine

## 2015-02-28 ENCOUNTER — Encounter: Payer: Self-pay | Admitting: Family Medicine

## 2015-02-28 VITALS — BP 156/102 | HR 90 | Temp 98.1°F | Resp 16 | Wt 241.2 lb

## 2015-02-28 DIAGNOSIS — I10 Essential (primary) hypertension: Secondary | ICD-10-CM

## 2015-02-28 LAB — LIPID PANEL
CHOLESTEROL: 163 mg/dL (ref 0–200)
HDL: 58 mg/dL (ref >39.00)
LDL CALC: 93 mg/dL (ref 0–99)
NonHDL: 105
Total CHOL/HDL Ratio: 3
Triglycerides: 59 mg/dL (ref 0.0–149.0)
VLDL: 11.8 mg/dL (ref 0.0–40.0)

## 2015-02-28 LAB — BASIC METABOLIC PANEL
BUN: 12 mg/dL (ref 6–23)
CALCIUM: 9.8 mg/dL (ref 8.4–10.5)
CO2: 29 mEq/L (ref 19–32)
CREATININE: 0.85 mg/dL (ref 0.40–1.20)
Chloride: 105 mEq/L (ref 96–112)
GFR: 88.85 mL/min (ref >60.00)
Glucose, Bld: 105 mg/dL — ABNORMAL HIGH (ref 70–99)
POTASSIUM: 3.9 meq/L (ref 3.5–5.1)
Sodium: 139 mEq/L (ref 135–145)

## 2015-02-28 LAB — CBC WITH DIFFERENTIAL/PLATELET
BASOS ABS: 0 10*3/uL (ref 0.0–0.1)
BASOS PCT: 0.4 % (ref 0.0–3.0)
EOS ABS: 0.1 10*3/uL (ref 0.0–0.7)
Eosinophils Relative: 2.3 % (ref 0.0–5.0)
HCT: 38.9 % (ref 36.0–46.0)
HEMOGLOBIN: 13.1 g/dL (ref 12.0–15.0)
LYMPHS ABS: 0.9 10*3/uL (ref 0.7–4.0)
Lymphocytes Relative: 23.1 % (ref 12.0–46.0)
MCHC: 33.7 g/dL (ref 30.0–36.0)
MCV: 87.7 fl (ref 78.0–100.0)
MONO ABS: 0.2 10*3/uL (ref 0.1–1.0)
Monocytes Relative: 6.2 % (ref 3.0–12.0)
NEUTROS ABS: 2.6 10*3/uL (ref 1.4–7.7)
Neutrophils Relative %: 68 % (ref 43.0–77.0)
Platelets: 236 10*3/uL (ref 150.0–400.0)
RBC: 4.43 Mil/uL (ref 3.87–5.11)
RDW: 13.5 % (ref 11.5–15.5)
WBC: 3.8 10*3/uL — ABNORMAL LOW (ref 4.0–10.5)

## 2015-02-28 LAB — HEPATIC FUNCTION PANEL
ALT: 13 U/L (ref 0–35)
AST: 15 U/L (ref 0–37)
Albumin: 4.1 g/dL (ref 3.5–5.2)
Alkaline Phosphatase: 68 U/L (ref 39–117)
BILIRUBIN DIRECT: 0.1 mg/dL (ref 0.0–0.3)
BILIRUBIN TOTAL: 0.5 mg/dL (ref 0.2–1.2)
Total Protein: 7 g/dL (ref 6.0–8.3)

## 2015-02-28 LAB — TSH: TSH: 1.11 u[IU]/mL (ref 0.35–4.50)

## 2015-02-28 MED ORDER — LOSARTAN POTASSIUM-HCTZ 50-12.5 MG PO TABS: 1.0000 | ORAL_TABLET | Freq: Every day | ORAL | Status: DC

## 2015-02-28 NOTE — Progress Notes (Signed)
Pre visit review using our clinic review tool, if applicable. No additional management support is needed unless otherwise documented below in the visit note. 

## 2015-02-28 NOTE — Progress Notes (Signed)
   Subjective:    Patient ID: Linda Buckley, female    DOB: [birth year], 59 y.o.   MRN: [id number]  HPI HTN- chronic problem, on HCTZ.  Pt reports BP has been elevated for at least 1 month.  No CP, + SOB, LE edema.  Recent HAs- difficult to determine if allergies vs HTN.  Denies increased salt intake.  Recently resumed water.    Review of Systems For ROS see HPI     Objective:   Physical Exam  Constitutional: She is oriented to person, place, and time. She appears well-developed and well-nourished. No distress.  HENT:  Head: Normocephalic and atraumatic.  Eyes: Conjunctivae and EOM are normal. Pupils are equal, round, and reactive to light.  Neck: Normal range of motion. Neck supple. No thyromegaly present.  Cardiovascular: Normal rate, regular rhythm, normal heart sounds and intact distal pulses.   No murmur heard. Pulmonary/Chest: Effort normal and breath sounds normal. No respiratory distress.  Abdominal: Soft. She exhibits no distension. There is no tenderness.  Musculoskeletal: She exhibits no edema.  Lymphadenopathy:    She has no cervical adenopathy.  Neurological: She is alert and oriented to person, place, and time.  Skin: Skin is warm and dry.  Psychiatric: She has a normal mood and affect. Her behavior is normal.  Vitals reviewed.         Assessment & Plan:

## 2015-02-28 NOTE — Telephone Encounter (Signed)
Med filled.  

## 2015-02-28 NOTE — Patient Instructions (Signed)
Follow up in 1 month to recheck BP We'll notify you of your lab results and make any changes if needed Start the Losartan HCTZ daily (the combo pill) for the BP Continue to drink plenty of fluids Call with any questions or concerns Hang in there!  We'll get this right!

## 2015-02-28 NOTE — Assessment & Plan Note (Signed)
Deteriorated.  BP is no longer controlled on HCTZ.  Based on this, will start Losartan HCTZ daily.  Check labs to risk stratify.  Reviewed supportive care and red flags that should prompt return.  Pt expressed understanding and is in agreement w/ plan.

## 2015-03-01 ENCOUNTER — Other Ambulatory Visit: Payer: Self-pay

## 2015-03-01 ENCOUNTER — Encounter: Payer: Self-pay | Admitting: General Practice

## 2015-03-01 DIAGNOSIS — Z1231 Encounter for screening mammogram for malignant neoplasm of breast: Secondary | ICD-10-CM

## 2015-03-17 HISTORY — PX: CERVICAL DISC ARTHROPLASTY: SHX587

## 2015-03-27 ENCOUNTER — Ambulatory Visit
Admission: RE | Admit: 2015-03-27 | Discharge: 2015-03-27 | Disposition: A | Discharge status: Home / Self Care | Payer: 59 | Source: Ambulatory Visit

## 2015-03-27 DIAGNOSIS — Z1231 Encounter for screening mammogram for malignant neoplasm of breast: Secondary | ICD-10-CM

## 2015-03-31 ENCOUNTER — Ambulatory Visit: Payer: 59 | Admitting: Family Medicine

## 2015-03-31 DIAGNOSIS — Z0289 Encounter for other administrative examinations: Secondary | ICD-10-CM

## 2015-04-05 ENCOUNTER — Telehealth: Payer: Self-pay | Admitting: Family Medicine

## 2015-04-05 ENCOUNTER — Ambulatory Visit (INDEPENDENT_AMBULATORY_CARE_PROVIDER_SITE_OTHER): Payer: 59 | Admitting: Family Medicine

## 2015-04-05 ENCOUNTER — Encounter: Payer: Self-pay | Admitting: Family Medicine

## 2015-04-05 VITALS — BP 132/88 | HR 78 | Temp 98.0°F | Resp 16 | Wt 240.2 lb

## 2015-04-05 DIAGNOSIS — M25531 Pain in right wrist: Secondary | ICD-10-CM | POA: Diagnosis not present

## 2015-04-05 DIAGNOSIS — F4323 Adjustment disorder with mixed anxiety and depressed mood: Secondary | ICD-10-CM | POA: Diagnosis not present

## 2015-04-05 DIAGNOSIS — I10 Essential (primary) hypertension: Secondary | ICD-10-CM | POA: Diagnosis not present

## 2015-04-05 MED ORDER — ESCITALOPRAM OXALATE 10 MG PO TABS: 10.0000 mg | ORAL_TABLET | Freq: Every day | ORAL | Status: DC

## 2015-04-05 MED ORDER — ALBUTEROL SULFATE HFA 108 (90 BASE) MCG/ACT IN AERS: 2.0000 | INHALATION_SPRAY | RESPIRATORY_TRACT | Status: DC | PRN

## 2015-04-05 MED ORDER — CLONAZEPAM 0.5 MG PO TABS: 0.5000 mg | ORAL_TABLET | Freq: Every day | ORAL | Status: DC

## 2015-04-05 MED ORDER — LOSARTAN POTASSIUM-HCTZ 50-12.5 MG PO TABS: 1.0000 | ORAL_TABLET | Freq: Every day | ORAL | Status: DC

## 2015-04-05 NOTE — Telephone Encounter (Signed)
Pt was no show for appt on 03/31/15- had to take mother to hospital. No letter sent due to rescheduled.

## 2015-04-05 NOTE — Progress Notes (Signed)
Pre visit review using our clinic review tool, if applicable. No additional management support is needed unless otherwise documented below in the visit note. 

## 2015-04-05 NOTE — Progress Notes (Signed)
   Subjective:    Patient ID: Linda Buckley, female    DOB: 06-09-1956, 59 y.o.   MRN: [id number]  HPI HTN- chronic problem, was elevated at last visit and pt was switched from HCTZ to Losartan HCTZ combo.  BP is much improved today.  No CP, SOB, HAs, visual changes.  R wrist pain- pt w/ hx of carpal tunnel, s/p surgery.  Reports she is again having pain, swelling, particularly at night.  No improvement w/ ibuprofen or aleve.    Adjustment disorder- mom decided to stop her chemo and radiation yesterday for stomach cancer.  Pt is very overwhelmed and can't sleep.  Pt tearful, 'it just comes on me'.  Review of Systems For ROS see HPI     Objective:   Physical Exam  Constitutional: She is oriented to person, place, and time. She appears well-developed and well-nourished. No distress.  HENT:  Head: Normocephalic and atraumatic.  Eyes: Conjunctivae and EOM are normal. Pupils are equal, round, and reactive to light.  Neck: Normal range of motion. Neck supple. No thyromegaly present.  Cardiovascular: Normal rate, regular rhythm, normal heart sounds and intact distal pulses.   No murmur heard. Pulmonary/Chest: Effort normal and breath sounds normal. No respiratory distress.  Abdominal: Soft. She exhibits no distension. There is no tenderness.  Musculoskeletal: She exhibits no edema.  Lymphadenopathy:    She has no cervical adenopathy.  Neurological: She is alert and oriented to person, place, and time.  Skin: Skin is warm and dry.  Psychiatric: Her behavior is normal.  Tearful, very upset when talking about mom  Vitals reviewed.         Assessment & Plan:

## 2015-04-05 NOTE — Patient Instructions (Signed)
Follow up in 4-6 weeks to recheck mood We'll notify you of your lab results and make any changes if needed Start the Lexapro daily for the anxiety Use the Clonazepam nightly as needed for anxiety/sleep Continue the Losartan HCTZ daily for BP- it looks great! We'll call you with your hand appt for the carpal tunnel Call with any questions or concerns Hang in there!

## 2015-04-06 LAB — BASIC METABOLIC PANEL
BUN: 15 mg/dL (ref 6–23)
CHLORIDE: 104 meq/L (ref 96–112)
CO2: 27 mEq/L (ref 19–32)
Calcium: 9.8 mg/dL (ref 8.4–10.5)
Creatinine, Ser: 0.92 mg/dL (ref 0.40–1.20)
GFR: 81.07 mL/min (ref >60.00)
Glucose, Bld: 98 mg/dL (ref 70–99)
Potassium: 3.5 mEq/L (ref 3.5–5.1)
SODIUM: 139 meq/L (ref 135–145)

## 2015-04-06 NOTE — Assessment & Plan Note (Signed)
New.  Reviewed w/ pt that her emotions on mom's decision to end cancer treatment are normal and valid as I do not want her to feel that she is pathologic in her feelings.  But due to her clear struggles, will start low dose SSRI and low dose benzo for night or panicked moments.  Reviewed supportive care and red flags that should prompt return.  Pt expressed understanding and is in agreement w/ plan.

## 2015-04-06 NOTE — Assessment & Plan Note (Signed)
Chronic problem.  Improved since starting combo Losartan HCTZ.  Check BMP.  No anticipated med changes.

## 2015-04-06 NOTE — Assessment & Plan Note (Signed)
Pt has hx of carpal tunnel surgery but again having severe R wrist pain.  Will refer to hand specialist for evaluation and tx.  Pt expressed understanding and is in agreement w/ plan.

## 2015-05-04 ENCOUNTER — Ambulatory Visit: Payer: 59 | Admitting: Family Medicine

## 2015-06-28 ENCOUNTER — Other Ambulatory Visit: Payer: Self-pay | Admitting: General Practice

## 2015-06-28 MED ORDER — LOSARTAN POTASSIUM-HCTZ 50-12.5 MG PO TABS: 1.0000 | ORAL_TABLET | Freq: Every day | ORAL | Status: DC

## 2015-06-30 ENCOUNTER — Encounter (HOSPITAL_BASED_OUTPATIENT_CLINIC_OR_DEPARTMENT_OTHER): Payer: Self-pay | Admitting: *Deleted

## 2015-07-04 ENCOUNTER — Encounter (HOSPITAL_COMMUNITY)
Admission: RE | Admit: 2015-07-04 | Discharge: 2015-07-04 | Disposition: A | Discharge status: Home / Self Care | Payer: 59 | Source: Ambulatory Visit | Attending: Orthopedic Surgery | Admitting: Orthopedic Surgery

## 2015-07-04 ENCOUNTER — Encounter (HOSPITAL_COMMUNITY): Payer: Self-pay

## 2015-07-04 DIAGNOSIS — Z79899 Other long term (current) drug therapy: Secondary | ICD-10-CM | POA: Diagnosis not present

## 2015-07-04 DIAGNOSIS — F329 Major depressive disorder, single episode, unspecified: Secondary | ICD-10-CM | POA: Diagnosis not present

## 2015-07-04 DIAGNOSIS — M79641 Pain in right hand: Secondary | ICD-10-CM | POA: Diagnosis present

## 2015-07-04 DIAGNOSIS — F419 Anxiety disorder, unspecified: Secondary | ICD-10-CM | POA: Diagnosis not present

## 2015-07-04 DIAGNOSIS — Z79891 Long term (current) use of opiate analgesic: Secondary | ICD-10-CM | POA: Diagnosis not present

## 2015-07-04 DIAGNOSIS — I499 Cardiac arrhythmia, unspecified: Secondary | ICD-10-CM | POA: Diagnosis not present

## 2015-07-04 DIAGNOSIS — G5601 Carpal tunnel syndrome, right upper limb: Secondary | ICD-10-CM | POA: Diagnosis not present

## 2015-07-04 DIAGNOSIS — I10 Essential (primary) hypertension: Secondary | ICD-10-CM | POA: Diagnosis not present

## 2015-07-04 DIAGNOSIS — R200 Anesthesia of skin: Secondary | ICD-10-CM | POA: Diagnosis present

## 2015-07-04 DIAGNOSIS — Z9889 Other specified postprocedural states: Secondary | ICD-10-CM | POA: Diagnosis not present

## 2015-07-04 DIAGNOSIS — M1990 Unspecified osteoarthritis, unspecified site: Secondary | ICD-10-CM | POA: Diagnosis not present

## 2015-07-04 HISTORY — DX: Pneumonia, unspecified organism: J18.9

## 2015-07-04 HISTORY — DX: Other allergy status, other than to drugs and biological substances: Z91.09

## 2015-07-04 HISTORY — DX: Presence of spectacles and contact lenses: Z97.3

## 2015-07-04 HISTORY — DX: Headache, unspecified: R51.9

## 2015-07-04 HISTORY — DX: Unspecified osteoarthritis, unspecified site: M19.90

## 2015-07-04 HISTORY — DX: Headache: R51

## 2015-07-04 LAB — CBC
HEMATOCRIT: 39.5 % (ref 36.0–46.0)
Hemoglobin: 12.6 g/dL (ref 12.0–15.0)
MCH: 29.9 pg (ref 26.0–34.0)
MCHC: 31.9 g/dL (ref 30.0–36.0)
MCV: 93.8 fL (ref 78.0–100.0)
Platelets: 257 10*3/uL (ref 150–400)
RBC: 4.21 MIL/uL (ref 3.87–5.11)
RDW: 12.8 % (ref 11.5–15.5)
WBC: 5 10*3/uL (ref 4.0–10.5)

## 2015-07-04 LAB — BASIC METABOLIC PANEL
ANION GAP: 5 (ref 5–15)
BUN: 12 mg/dL (ref 6–20)
CALCIUM: 9.2 mg/dL (ref 8.9–10.3)
CHLORIDE: 108 mmol/L (ref 101–111)
CO2: 29 mmol/L (ref 22–32)
CREATININE: 0.95 mg/dL (ref 0.44–1.00)
GFR calc Af Amer: >60 mL/min (ref >60)
GFR calc non Af Amer: >60 mL/min (ref >60)
Glucose, Bld: 96 mg/dL (ref 65–99)
Potassium: 3.1 mmol/L — ABNORMAL LOW (ref 3.5–5.1)
Sodium: 142 mmol/L (ref 135–145)

## 2015-07-04 MED ORDER — CEFAZOLIN SODIUM-DEXTROSE 2-3 GM-% IV SOLR
2.0000 g | INTRAVENOUS | Status: AC
  Administered 2015-07-05: 2 g via INTRAVENOUS
  Filled 2015-07-04 (×2): qty 50

## 2015-07-04 NOTE — Progress Notes (Signed)
Pt denies SOB, chest pain, and being under the care of a cardiologist. Pt denies having a stress test, echo and cardiac cath. Pt denies having a chest x ray and EKG within the last year. 

## 2015-07-04 NOTE — Pre-Procedure Instructions (Signed)
Linda Buckley  07/04/2015      [facility] - [place], [place] - [address] AT [facility] [address] [place] [address] Phone: [phone] Fax: [phone]  [place] [address] [place] [address] [place] [address] Phone: [phone] Fax: [phone]    Your procedure is scheduled on Wednesday, July 05, 2015  Report to [facility] Admitting at 11:45 A.M.  Call this number if you have problems the morning of surgery:  [phone]   Remember:  Do not eat food or drink liquids after midnight.  Take these medicines the morning of surgery with A SIP OF WATER : escitalopram (LEXAPRO), if needed: EPINEPHrine (EPIPEN), albuterol (PROVENTIL HFA;VENTOLIN HFA) inhaler for shortness of breath ( bring inhaler in with you on day of procedure).  Stop taking Aspirin, vitamins and herbal medications. Do not take any NSAIDs ie: Ibuprofen, Advil, Naproxen or any medication containing Aspirin; stop.   Do not wear jewelry, make-up or nail polish.  Do not wear lotions, powders, or perfumes.  You may not wear deodorant.  Do not shave 48 hours prior to surgery.    Do not bring valuables to the hospital.  [facility] is not responsible for any belongings or valuables.  Contacts, dentures or bridgework may not be worn into surgery.  Leave your suitcase in the car.  After surgery it may be brought to your room.  For patients admitted to the hospital, discharge time will be determined by your treatment team.  Patients discharged the day of surgery will not be allowed to drive home.   Name and phone number of your driver:   Special instructions:  Special Instructions:Special Instructions: [facility] - Preparing for Surgery  Before surgery, you can play an important role.  Because skin is not sterile, your skin needs to be as free of germs as possible.  You can reduce the  number of germs on you skin by washing with CHG (chlorahexidine gluconate) soap before surgery.  CHG is an antiseptic cleaner which kills germs and bonds with the skin to continue killing germs even after washing.  Please DO NOT use if you have an allergy to CHG or antibacterial soaps.  If your skin becomes reddened/irritated stop using the CHG and inform your nurse when you arrive at Short Stay.  Do not shave (including legs and underarms) for at least 48 hours prior to the first CHG shower.  You may shave your face.  Please follow these instructions carefully:   1.  Shower with CHG Soap the night before surgery and the morning of Surgery.  2.  If you choose to wash your hair, wash your hair first as usual with your normal shampoo.  3.  After you shampoo, rinse your hair and body thoroughly to remove the Shampoo.  4.  Use CHG as you would any other liquid soap.  You can apply chg directly  to the skin and wash gently with scrungie or a clean washcloth.  5.  Apply the CHG Soap to your body ONLY FROM THE NECK DOWN.  Do not use on open wounds or open sores.  Avoid contact with your eyes, ears, mouth and genitals (private parts).  Wash genitals (private parts) with your normal soap.  6.  Wash thoroughly, paying special attention to the area where your surgery will be performed.  7.  Thoroughly rinse  your body with warm water from the neck down.  8.  DO NOT shower/wash with your normal soap after using and rinsing off the CHG Soap.  9.  Pat yourself dry with a clean towel.            10.  Wear clean pajamas.            11.  Place clean sheets on your bed the night of your first shower and do not sleep with pets.  Day of Surgery  Do not apply any lotions/deodorants the morning of surgery.  Please wear clean clothes to the hospital/surgery center.  Please read over the following fact sheets that you were given. Pain Booklet, Coughing and Deep Breathing and Surgical Site Infection  Prevention

## 2015-07-05 ENCOUNTER — Ambulatory Visit (HOSPITAL_COMMUNITY): Payer: 59 | Admitting: Anesthesiology

## 2015-07-05 ENCOUNTER — Encounter (HOSPITAL_COMMUNITY): Payer: Self-pay | Admitting: *Deleted

## 2015-07-05 ENCOUNTER — Encounter (HOSPITAL_COMMUNITY)
Admission: RE | Disposition: A | Discharge status: Home / Self Care | Payer: Self-pay | Source: Ambulatory Visit | Attending: Orthopedic Surgery

## 2015-07-05 ENCOUNTER — Ambulatory Visit (HOSPITAL_BASED_OUTPATIENT_CLINIC_OR_DEPARTMENT_OTHER)
Admission: RE | Admit: 2015-07-05 | Discharge: 2015-07-05 | Disposition: A | Discharge status: Home / Self Care | Payer: 59 | Source: Ambulatory Visit | Attending: Orthopedic Surgery | Admitting: Orthopedic Surgery

## 2015-07-05 DIAGNOSIS — F329 Major depressive disorder, single episode, unspecified: Secondary | ICD-10-CM | POA: Insufficient documentation

## 2015-07-05 DIAGNOSIS — Z79899 Other long term (current) drug therapy: Secondary | ICD-10-CM | POA: Insufficient documentation

## 2015-07-05 DIAGNOSIS — G5601 Carpal tunnel syndrome, right upper limb: Secondary | ICD-10-CM | POA: Diagnosis not present

## 2015-07-05 DIAGNOSIS — Z79891 Long term (current) use of opiate analgesic: Secondary | ICD-10-CM | POA: Insufficient documentation

## 2015-07-05 DIAGNOSIS — I499 Cardiac arrhythmia, unspecified: Secondary | ICD-10-CM | POA: Insufficient documentation

## 2015-07-05 DIAGNOSIS — Z9889 Other specified postprocedural states: Secondary | ICD-10-CM | POA: Insufficient documentation

## 2015-07-05 DIAGNOSIS — M1990 Unspecified osteoarthritis, unspecified site: Secondary | ICD-10-CM | POA: Insufficient documentation

## 2015-07-05 DIAGNOSIS — I10 Essential (primary) hypertension: Secondary | ICD-10-CM | POA: Insufficient documentation

## 2015-07-05 DIAGNOSIS — F419 Anxiety disorder, unspecified: Secondary | ICD-10-CM | POA: Insufficient documentation

## 2015-07-05 HISTORY — DX: Anxiety disorder, unspecified: F41.9

## 2015-07-05 HISTORY — DX: Depression, unspecified: F32.A

## 2015-07-05 HISTORY — DX: Personal history of colonic polyps: Z86.010

## 2015-07-05 HISTORY — DX: Personal history of adenomatous and serrated colon polyps: Z86.0101

## 2015-07-05 HISTORY — DX: Carpal tunnel syndrome, right upper limb: G56.01

## 2015-07-05 HISTORY — DX: Personal history of other diseases of the digestive system: Z87.19

## 2015-07-05 HISTORY — PX: CARPAL TUNNEL RELEASE: SHX101

## 2015-07-05 HISTORY — DX: Personal history of peptic ulcer disease: Z87.11

## 2015-07-05 HISTORY — DX: Major depressive disorder, single episode, unspecified: F32.9

## 2015-07-05 SURGERY — CARPAL TUNNEL RELEASE
Anesthesia: General | Site: Hand | Laterality: Right

## 2015-07-05 MED ORDER — OXYCODONE-ACETAMINOPHEN 10-325 MG PO TABS: 1.0000 | ORAL_TABLET | ORAL | Status: DC | PRN

## 2015-07-05 MED ORDER — BUPIVACAINE HCL (PF) 0.25 % IJ SOLN
INTRAMUSCULAR | Status: DC | PRN
Start: 2015-07-05 — End: 2015-07-05
  Administered 2015-07-05: 30 mL

## 2015-07-05 MED ORDER — FENTANYL CITRATE (PF) 100 MCG/2ML IJ SOLN
INTRAMUSCULAR | Status: AC
  Filled 2015-07-05: qty 2

## 2015-07-05 MED ORDER — LIDOCAINE HCL (CARDIAC) 20 MG/ML IV SOLN
INTRAVENOUS | Status: DC | PRN
  Administered 2015-07-05: 50 mg via INTRAVENOUS

## 2015-07-05 MED ORDER — MIDAZOLAM HCL 2 MG/2ML IJ SOLN
INTRAMUSCULAR | Status: AC
  Filled 2015-07-05: qty 2

## 2015-07-05 MED ORDER — PROPOFOL 10 MG/ML IV BOLUS
INTRAVENOUS | Status: AC
  Filled 2015-07-05: qty 20

## 2015-07-05 MED ORDER — FENTANYL CITRATE (PF) 100 MCG/2ML IJ SOLN
INTRAMUSCULAR | Status: DC | PRN
  Administered 2015-07-05 (×5): 50 ug via INTRAVENOUS

## 2015-07-05 MED ORDER — OXYCODONE HCL 5 MG PO TABS
ORAL_TABLET | ORAL | Status: AC
  Filled 2015-07-05: qty 1

## 2015-07-05 MED ORDER — 0.9 % SODIUM CHLORIDE (POUR BTL) OPTIME
TOPICAL | Status: DC | PRN
  Administered 2015-07-05: 1000 mL

## 2015-07-05 MED ORDER — BUPIVACAINE HCL (PF) 0.25 % IJ SOLN
INTRAMUSCULAR | Status: AC
  Filled 2015-07-05: qty 30

## 2015-07-05 MED ORDER — LACTATED RINGERS IV SOLN
INTRAVENOUS | Status: DC
  Administered 2015-07-05: 12:00:00 via INTRAVENOUS

## 2015-07-05 MED ORDER — LIDOCAINE HCL (PF) 1 % IJ SOLN
INTRAMUSCULAR | Status: AC
  Filled 2015-07-05: qty 30

## 2015-07-05 MED ORDER — OXYCODONE-ACETAMINOPHEN 5-325 MG PO TABS
ORAL_TABLET | ORAL | Status: AC
  Filled 2015-07-05: qty 1

## 2015-07-05 MED ORDER — OXYCODONE HCL 5 MG PO TABS
5.0000 mg | ORAL_TABLET | Freq: Once | ORAL | Status: AC
  Administered 2015-07-05: 5 mg via ORAL

## 2015-07-05 MED ORDER — FENTANYL CITRATE (PF) 100 MCG/2ML IJ SOLN
25.0000 ug | INTRAMUSCULAR | Status: DC | PRN
  Administered 2015-07-05 (×3): 50 ug via INTRAVENOUS

## 2015-07-05 MED ORDER — ONDANSETRON HCL 4 MG/2ML IJ SOLN
INTRAMUSCULAR | Status: DC | PRN
  Administered 2015-07-05: 4 mg via INTRAVENOUS

## 2015-07-05 MED ORDER — PROMETHAZINE HCL 25 MG/ML IJ SOLN
6.2500 mg | INTRAMUSCULAR | Status: DC | PRN
Start: 2015-07-05 — End: 2015-07-05

## 2015-07-05 MED ORDER — CHLORHEXIDINE GLUCONATE 4 % EX LIQD: 60.0000 mL | Freq: Once | CUTANEOUS | Status: DC

## 2015-07-05 MED ORDER — OXYCODONE-ACETAMINOPHEN 5-325 MG PO TABS
1.0000 | ORAL_TABLET | Freq: Once | ORAL | Status: AC
  Administered 2015-07-05: 1 via ORAL

## 2015-07-05 MED ORDER — FENTANYL CITRATE (PF) 250 MCG/5ML IJ SOLN
INTRAMUSCULAR | Status: AC
  Filled 2015-07-05: qty 5

## 2015-07-05 MED ORDER — DOCUSATE SODIUM 100 MG PO CAPS: 100.0000 mg | ORAL_CAPSULE | Freq: Two times a day (BID) | ORAL | Status: DC

## 2015-07-05 MED ORDER — PROPOFOL 10 MG/ML IV BOLUS
INTRAVENOUS | Status: DC | PRN
  Administered 2015-07-05: 50 mg via INTRAVENOUS
  Administered 2015-07-05: 200 mg via INTRAVENOUS

## 2015-07-05 SURGICAL SUPPLY — 47 items
BANDAGE ELASTIC 3 VELCRO ST LF (GAUZE/BANDAGES/DRESSINGS) ×4 IMPLANT
BANDAGE ELASTIC 4 VELCRO ST LF (GAUZE/BANDAGES/DRESSINGS) ×2 IMPLANT
BNDG CMPR 9X4 STRL LF SNTH (GAUZE/BANDAGES/DRESSINGS) ×1
BNDG ESMARK 4X9 LF (GAUZE/BANDAGES/DRESSINGS) ×2 IMPLANT
BNDG GAUZE ELAST 4 BULKY (GAUZE/BANDAGES/DRESSINGS) ×2 IMPLANT
CORDS BIPOLAR (ELECTRODE) ×2 IMPLANT
COVER SURGICAL LIGHT HANDLE (MISCELLANEOUS) ×2 IMPLANT
CUFF TOURNIQUET SINGLE 18IN (TOURNIQUET CUFF) ×2 IMPLANT
CUFF TOURNIQUET SINGLE 24IN (TOURNIQUET CUFF) IMPLANT
DRAPE SURG 17X23 STRL (DRAPES) ×2 IMPLANT
EVACUATOR 1/8 PVC DRAIN (DRAIN) IMPLANT
GAUZE SPONGE 4X4 12PLY STRL (GAUZE/BANDAGES/DRESSINGS) ×2 IMPLANT
GAUZE XEROFORM 1X8 LF (GAUZE/BANDAGES/DRESSINGS) ×2 IMPLANT
GLOVE BIOGEL PI IND STRL 8.5 (GLOVE) ×1 IMPLANT
GLOVE BIOGEL PI INDICATOR 8.5 (GLOVE) ×1
GLOVE SURG ORTHO 8.0 STRL STRW (GLOVE) ×2 IMPLANT
GOWN STRL REUS W/ TWL LRG LVL3 (GOWN DISPOSABLE) ×2 IMPLANT
GOWN STRL REUS W/ TWL XL LVL3 (GOWN DISPOSABLE) ×1 IMPLANT
GOWN STRL REUS W/TWL LRG LVL3 (GOWN DISPOSABLE) ×4
GOWN STRL REUS W/TWL XL LVL3 (GOWN DISPOSABLE) ×2
KIT BASIN OR (CUSTOM PROCEDURE TRAY) ×2 IMPLANT
KIT ROOM TURNOVER OR (KITS) ×2 IMPLANT
LOOP VESSEL MAXI BLUE (MISCELLANEOUS) IMPLANT
NDL HYPO 25GX1X1/2 BEV (NEEDLE) IMPLANT
NEEDLE HYPO 25GX1X1/2 BEV (NEEDLE) IMPLANT
NS IRRIG 1000ML POUR BTL (IV SOLUTION) ×2 IMPLANT
PACK ORTHO EXTREMITY (CUSTOM PROCEDURE TRAY) ×2 IMPLANT
PAD ARMBOARD 7.5X6 YLW CONV (MISCELLANEOUS) ×4 IMPLANT
PAD CAST 3X4 CTTN HI CHSV (CAST SUPPLIES) IMPLANT
PAD CAST 4YDX4 CTTN HI CHSV (CAST SUPPLIES) ×2 IMPLANT
PADDING CAST COTTON 3X4 STRL (CAST SUPPLIES) ×2
PADDING CAST COTTON 4X4 STRL (CAST SUPPLIES) ×4
SOAP 2 % CHG 4 OZ (WOUND CARE) ×2 IMPLANT
SPLINT FIBERGLASS 3X12 (CAST SUPPLIES) ×1 IMPLANT
SUCTION FRAZIER TIP 10 FR DISP (SUCTIONS) ×1 IMPLANT
SUT PROLENE 4 0 PS 2 18 (SUTURE) IMPLANT
SUT VIC AB 2-0 CT1 27 (SUTURE)
SUT VIC AB 2-0 CT1 TAPERPNT 27 (SUTURE) IMPLANT
SUT VIC AB 3-0 FS2 27 (SUTURE) IMPLANT
SUT VICRYL RAPIDE 4/0 PS 2 (SUTURE) ×2 IMPLANT
SYR CONTROL 10ML LL (SYRINGE) IMPLANT
SYSTEM CHEST DRAIN TLS 7FR (DRAIN) IMPLANT
TOWEL OR 17X24 6PK STRL BLUE (TOWEL DISPOSABLE) ×2 IMPLANT
TOWEL OR 17X26 10 PK STRL BLUE (TOWEL DISPOSABLE) ×2 IMPLANT
TUBE CONNECTING 12X1/4 (SUCTIONS) IMPLANT
UNDERPAD 30X30 INCONTINENT (UNDERPADS AND DIAPERS) ×2 IMPLANT
WATER STERILE IRR 1000ML POUR (IV SOLUTION) ×2 IMPLANT

## 2015-07-05 NOTE — Anesthesia Procedure Notes (Signed)
Procedure Name: LMA Insertion Date/Time: 07/05/2015 3:04 PM Performed by: Vennie Homans Pre-anesthesia Checklist: Patient identified, Emergency Drugs available, Suction available, Patient being monitored and Timeout performed Patient Re-evaluated:Patient Re-evaluated prior to inductionOxygen Delivery Method: Circle system utilized Preoxygenation: Pre-oxygenation with 100% oxygen Intubation Type: IV induction Ventilation: Mask ventilation without difficulty LMA: LMA inserted LMA Size: 4.0 Number of attempts: 1 Placement Confirmation: positive ETCO2 and breath sounds checked- equal and bilateral Tube secured with: Tape Dental Injury: Teeth and Oropharynx as per pre-operative assessment

## 2015-07-05 NOTE — H&P (Signed)
Linda Buckley is an 59 y.o. female.   Chief Complaint: RIGHT HAND PAIN HPI: PT FOLLOWED IN OFFICE PT WITH PERSISTENT PAIN AND NUMBNESS IN HAND PT HERE FOR SURGERY H/O RIGHT HAND CARPAL TUNNEL RELEASE > 10 YEARS  Past Medical History  Diagnosis Date  . History of kidney stones   . Hypertension   . History of gastric ulcer   . History of adenomatous polyp of colon   . Anxiety   . Depression   . Carpal tunnel syndrome of right wrist     RECURRENT  . Wears glasses   . Pneumonia   . Headache     migraines  . Arthritis   . Environmental allergies     dust mites, pollen, roaches and other insects, mold    Past Surgical History  Procedure Laterality Date  . Tubal ligation  1982  . Laparoscopic assisted vaginal hysterectomy  04-25-2004    ovaries remain  . Carpal tunnel release Right 2007  . Cysto/  right retrograde pyelogram/ ureteroscopy stone extraction/  stent placement  06-07-2010  . Colonoscopy w/ polypectomy  04-15-2014    Family History  Problem Relation Age of Onset  . Asthma Son   . Asthma Father   . Cancer Maternal Grandfather     colon  . Colon cancer Maternal Grandfather   . Arthritis Mother   . Hypertension Mother   . Diabetes Mother   . Cancer Maternal Aunt     breast  . Cancer Maternal Uncle     lung  . Colon cancer Maternal Uncle   . Cancer Maternal Grandmother     ovarian  . Diabetes Son    Social History:  reports that she has never smoked. She has never used smokeless tobacco. She reports that she drinks alcohol. She reports that she does not use illicit drugs.  Allergies:  Allergies  Allergen Reactions  . Bee Venom Swelling  . Aspirin     Sweating/ swelling    Medications Prior to Admission  Medication Sig Dispense Refill  . clonazePAM (KLONOPIN) 0.5 MG tablet Take 1 tablet (0.5 mg total) by mouth at bedtime. 30 tablet 1  . EPINEPHrine (EPIPEN) 0.3 mg/0.3 mL SOAJ injection Inject 0.3 mg into the muscle once.    Marland Kitchen ibuprofen  (ADVIL,MOTRIN) 200 MG tablet Take 800 mg by mouth every 6 (six) hours as needed (For aches and pain.).    Marland Kitchen losartan (COZAAR) 50 MG tablet Take 50 mg by mouth daily.    Marland Kitchen losartan-hydrochlorothiazide (HYZAAR) 50-12.5 MG per tablet Take 1 tablet by mouth daily. 90 tablet 1  . traMADol (ULTRAM) 50 MG tablet Take 50 mg by mouth at bedtime.    Marland Kitchen albuterol (PROVENTIL HFA;VENTOLIN HFA) 108 (90 BASE) MCG/ACT inhaler Inhale 2 puffs into the lungs every 4 (four) hours as needed for shortness of breath. 2 Inhaler 3  . escitalopram (LEXAPRO) 10 MG tablet Take 1 tablet (10 mg total) by mouth daily. (Patient not taking: Reported on 07/04/2015) 30 tablet 3    Results for orders placed or performed during the hospital encounter of 07/04/15 (from the past 48 hour(s))  CBC     Status: None   Collection Time: 07/04/15  3:04 PM  Result Value Ref Range   WBC 5.0 4.0 - 10.5 K/uL   RBC 4.21 3.87 - 5.11 MIL/uL   Hemoglobin 12.6 12.0 - 15.0 g/dL   HCT 39.5 36.0 - 46.0 %   MCV 93.8 78.0 - 100.0 fL  MCH 29.9 26.0 - 34.0 pg   MCHC 31.9 30.0 - 36.0 g/dL   RDW 12.8 11.5 - 15.5 %   Platelets 257 150 - 400 K/uL  Basic metabolic panel     Status: Abnormal   Collection Time: 07/04/15  3:04 PM  Result Value Ref Range   Sodium 142 135 - 145 mmol/L   Potassium 3.1 (L) 3.5 - 5.1 mmol/L   Chloride 108 101 - 111 mmol/L   CO2 29 22 - 32 mmol/L   Glucose, Bld 96 65 - 99 mg/dL   BUN 12 6 - 20 mg/dL   Creatinine, Ser 0.95 0.44 - 1.00 mg/dL   Calcium 9.2 8.9 - 10.3 mg/dL   GFR calc non Af Amer >60 >60 mL/min   GFR calc Af Amer >60 >60 mL/min    Comment: (NOTE) The eGFR has been calculated using the CKD EPI equation. This calculation has not been validated in all clinical situations. eGFR's persistently <60 mL/min signify possible Chronic Kidney Disease.    Anion gap 5 5 - 15   No results found.  ROS NO RECENT ILLNESSES OR HOSPITALIZATIONS  Blood pressure 141/94, pulse 55, temperature 97.4 F (36.3 C), temperature  source Oral, resp. rate 20, weight 107.502 kg (237 lb), SpO2 100 %. Physical Exam   General Appearance:  Alert, cooperative, no distress, appears stated age  Head:  Normocephalic, without obvious abnormality, atraumatic  Eyes:  Pupils equal, conjunctiva/corneas clear,         Throat: Lips, mucosa, and tongue normal; teeth and gums normal  Neck: No visible masses     Lungs:   respirations unlabored  Chest Wall:  No tenderness or deformity  Heart:  Regular rate and rhythm,  Abdomen:   Soft, non-tender,         Extremities: RIGHT HAND: FINGERS WARM WELL HEALED INCISION ABLE TO PALMAR ABDUCT THUMB GOOD WRIST AND DIGITAL  MOTION GOOD FOREARM ROTATION  Pulses: 2+ and symmetric  Skin: Skin color, texture, turgor normal, no rashes or lesions     Neurologic: Normal    Assessment/Plan RECURRENT RIGHT HAND CARPAL TUNNEL SYNDROME  RIGHT HAND REVISION CARPAL TUNNEL RELEASE POSSIBLE FAT PAD CLOSURE  R/B/A DISCUSSED WITH PT IN OFFICE.  PT VOICED UNDERSTANDING OF PLAN CONSENT SIGNED DAY OF SURGERY PT SEEN AND EXAMINED PRIOR TO OPERATIVE PROCEDURE/DAY OF SURGERY SITE MARKED. QUESTIONS ANSWERED WILL GO HOME FOLLOWING SURGERY  WE ARE PLANNING SURGERY FOR YOUR UPPER EXTREMITY. THE RISKS AND BENEFITS OF SURGERY INCLUDE BUT NOT LIMITED TO BLEEDING INFECTION, DAMAGE TO NEARBY NERVES ARTERIES TENDONS, FAILURE OF SURGERY TO ACCOMPLISH ITS INTENDED GOALS, PERSISTENT SYMPTOMS AND NEED FOR FURTHER SURGICAL INTERVENTION. WITH THIS IN MIND WE WILL PROCEED. I HAVE DISCUSSED WITH THE PATIENT THE PRE AND POSTOPERATIVE REGIMEN AND THE DOS AND DON'TS. PT VOICED UNDERSTANDING AND INFORMED CONSENT SIGNED.  Linna Hoff 07/05/2015, 1450PM

## 2015-07-05 NOTE — Transfer of Care (Signed)
Immediate Anesthesia Transfer of Care Note  Patient: Linda Buckley  Procedure(s) Performed: Procedure(s): RIGHT HAND REVISION CARPAL TUNNEL RELEASE (Right)  Patient Location: PACU  Anesthesia Type:General  Level of Consciousness: awake, alert , oriented, patient cooperative and responds to stimulation  Airway & Oxygen Therapy: Patient Spontanous Breathing and Patient connected to nasal cannula oxygen  Post-op Assessment: Report given to RN, Post -op Vital signs reviewed and stable, Patient moving all extremities X 4 and Patient able to stick tongue midline  Post vital signs: stable  Last Vitals:  Filed Vitals:   07/05/15 1207  BP: 141/94  Pulse: 55  Temp: 36.3 C  Resp: 20    Complications: No apparent anesthesia complications

## 2015-07-05 NOTE — Anesthesia Preprocedure Evaluation (Addendum)
Anesthesia Evaluation  Patient identified by MRN, date of birth, ID band Patient awake    Reviewed: Allergy & Precautions, NPO status , Patient's Chart, lab work & pertinent test results  Airway Mallampati: II  TM Distance: <3 FB Neck ROM: Full    Dental no notable dental hx.    Pulmonary neg pulmonary ROS,  breath sounds clear to auscultation  Pulmonary exam normal       Cardiovascular hypertension, Pt. on medications Normal cardiovascular examRhythm:Regular Rate:Normal     Neuro/Psych negative neurological ROS  negative psych ROS   GI/Hepatic negative GI ROS, Neg liver ROS,   Endo/Other  Morbid obesity  Renal/GU negative Renal ROS  negative genitourinary   Musculoskeletal negative musculoskeletal ROS (+)   Abdominal   Peds negative pediatric ROS (+)  Hematology negative hematology ROS (+)   Anesthesia Other Findings   Reproductive/Obstetrics negative OB ROS                            Anesthesia Physical Anesthesia Plan  ASA: II  Anesthesia Plan: General   Post-op Pain Management:    Induction: Intravenous  Airway Management Planned: LMA  Additional Equipment:   Intra-op Plan:   Post-operative Plan:   Informed Consent: I have reviewed the patients History and Physical, chart, labs and discussed the procedure including the risks, benefits and alternatives for the proposed anesthesia with the patient or authorized representative who has indicated his/her understanding and acceptance.   Dental advisory given  Plan Discussed with: CRNA and Surgeon  Anesthesia Plan Comments: (Pt requests GA)       Anesthesia Quick Evaluation

## 2015-07-05 NOTE — Anesthesia Postprocedure Evaluation (Signed)
  Anesthesia Post-op Note  Patient: Linda Buckley  Procedure(s) Performed: Procedure(s): RIGHT HAND REVISION CARPAL TUNNEL RELEASE (Right)  Patient Location: PACU  Anesthesia Type:General  Level of Consciousness: awake, alert  and oriented  Airway and Oxygen Therapy: Patient Spontanous Breathing and Patient connected to nasal cannula oxygen  Post-op Pain: mild  Post-op Assessment: Post-op Vital signs reviewed, Patient's Cardiovascular Status Stable, Respiratory Function Stable, Patent Airway and Pain level controlled              Post-op Vital Signs: stable  Last Vitals:  Filed Vitals:   07/05/15 1701  BP: 138/88  Pulse: 52  Temp:   Resp: 20    Complications: No apparent anesthesia complications

## 2015-07-05 NOTE — Discharge Instructions (Signed)
KEEP BANDAGE CLEAN AND DRY CALL OFFICE FOR F/U APPT [phone] in 15 days KEEP HAND ELEVATED ABOVE HEART OK TO APPLY ICE TO OPERATIVE AREA CONTACT OFFICE IF ANY WORSENING PAIN OR CONCERNS.  

## 2015-07-05 NOTE — Brief Op Note (Signed)
07/05/2015  1:17 PM  PATIENT:  Linda Buckley  59 y.o. female  PRE-OPERATIVE DIAGNOSIS:  right hand recurrent carpal tunnel syndrome   POST-OPERATIVE DIAGNOSIS:  * No post-op diagnosis entered *  PROCEDURE:  Procedure(s): RIGHT HAND REVISION CARPAL TUNNEL RELEASE (Right)  SURGEON:  Surgeon(s) and Role:    * Iran Planas, MD - Primary  PHYSICIAN ASSISTANT:   ASSISTANTS: none   ANESTHESIA:   general  EBL:     BLOOD ADMINISTERED:none  DRAINS: none   LOCAL MEDICATIONS USED:  MARCAINE     SPECIMEN:  No Specimen  DISPOSITION OF SPECIMEN:  N/A  COUNTS:  YES  TOURNIQUET:    DICTATION: .[id number]  PLAN OF CARE: Discharge to home after PACU  PATIENT DISPOSITION:  PACU - hemodynamically stable.   Delay start of Pharmacological VTE agent (>24hrs) due to surgical blood loss or risk of bleeding: not applicable

## 2015-07-06 ENCOUNTER — Encounter (HOSPITAL_COMMUNITY): Payer: Self-pay | Admitting: Orthopedic Surgery

## 2015-07-06 NOTE — Op Note (Signed)
NAME:  Linda Buckley, Linda Buckley NO.:  [id number]  MEDICAL RECORD NO.:  [id number]  LOCATION:  [facility]                         FACILITY:  [facility]  PHYSICIAN:  Linna Hoff IV, M.D.DATE OF BIRTH:  [birth year]  DATE OF PROCEDURE:  07/05/2015 DATE OF DISCHARGE:  07/05/2015                              OPERATIVE REPORT   PREOPERATIVE DIAGNOSIS:  Right hand recurrent carpal tunnel syndrome.  POSTOPERATIVE DIAGNOSIS:  Right hand recurrent carpal tunnel syndrome.  ATTENDING PHYSICIAN:  Linna Hoff, M.D., who scrubbed and present for the entire procedure.  ASSISTANT SURGEON:  None.  ANESTHESIA:  General via LMA.  SURGICAL INDICATIONS:  Ms. Subia is a right-hand-dominant female with persistent right hand pain.  The patient had an extensive workup to include nerve conduction studies, rheumatological consultation, and after failure of nonsurgical treatment, the patient elected to undergo revision of carpal tunnel release.  Her carpal tunnel was released more than 10 years ago by Dr. Shellia Carwin.  Risks, benefits, and alternatives were discussed in detail with the patient.  A signed informed consent was obtained.  Risks include, but not limited to, bleeding, infection, damage to nearby nerves, arteries or tendons, loss of motion of wrists and digits, incomplete relief of symptoms, persistent pain, and difficulty with the right hand.  DESCRIPTION OF PROCEDURE:  The patient was properly identified in the preoperative holding area and marked with a permanent marker made on the right hand to indicate the correct operative site.  The patient was then brought back to the operating room, placed supine on the anesthesia room table, general anesthesia was administered.  The patient tolerated this well.  A well-padded tourniquet was then placed on the right brachium and sealed with 1000 drape.  The right upper extremity was then prepped and draped in normal sterile fashion.  Time-out  was called, the correct side was identified and procedure then begun.  Attention was then turned to the right hand.  The previous incision that had been extended across the wrist crease was then used.  It was curved ulnarly at the wrist crease.  The limb was then elevated and the tourniquet insufflated. Dissection was carried down through the skin and subcutaneous tissue. The palmaris longus was identified proximally.  Just going all the way to the palmaris longus, the antebrachial fascia was then released.  The median nerve was then identified and healthy and normal-appearing tissue.  Following this, the nerve was then carefully traced through the transverse carpal ligament.  This had some mild adherence to the mid portion of the transverse carpal ligament.  __________ significant amount of scarring within the carpal canal.  Therefore, hypothenar fat pad transposition flap was now performed.  The nerve was traced all the way out distally.  The recurrent motor branch of the median nerve was then identified as well as the takeoff to the digital nerves.  The contents of carpal canal were inspected.  No other abnormalities were noted.  After transverse carpal ligament release and release of the median nerve, the tourniquet was then deflated.  Hemostasis was then obtained with thorough irrigation and bipolar cautery.  Following this, skin was then closed using simple 4-0 Vicryl Rapide sutures.  Xeroform dressing, sterile compressive bandage then applied.  The patient was then placed in a well-padded volar splint and then extubated and taken to recovery room in good condition.  POSTPROCEDURE PLAN:  The patient to be discharged to home and seen back in the office in approximately 2 weeks for wound check, suture removal, transition into a padded wrist support and then gradual use and then begin a postoperative carpal tunnel eval and treat.  Given the difficulty she is having with the hand, will  likely see her therapist at the first visit.     Melrose Nakayama, M.D.     FWO/MEDQ  D:  07/05/2015  T:  07/06/2015  Job:  [id number]

## 2015-07-28 ENCOUNTER — Encounter: Payer: Self-pay | Admitting: Family Medicine

## 2015-07-28 ENCOUNTER — Ambulatory Visit (INDEPENDENT_AMBULATORY_CARE_PROVIDER_SITE_OTHER): Payer: 59 | Admitting: Family Medicine

## 2015-07-28 VITALS — BP 120/82 | HR 77 | Temp 98.1°F | Resp 16 | Wt 232.5 lb

## 2015-07-28 DIAGNOSIS — M791 Myalgia, unspecified site: Secondary | ICD-10-CM

## 2015-07-28 DIAGNOSIS — M797 Fibromyalgia: Secondary | ICD-10-CM | POA: Insufficient documentation

## 2015-07-28 MED ORDER — CYCLOBENZAPRINE HCL 10 MG PO TABS: 10.0000 mg | ORAL_TABLET | Freq: Three times a day (TID) | ORAL | Status: DC | PRN

## 2015-07-28 MED ORDER — MELOXICAM 15 MG PO TABS: 15.0000 mg | ORAL_TABLET | Freq: Every day | ORAL | Status: DC

## 2015-07-28 NOTE — Progress Notes (Signed)
Pre visit review using our clinic review tool, if applicable. No additional management support is needed unless otherwise documented below in the visit note. 

## 2015-07-28 NOTE — Progress Notes (Signed)
   Subjective:    Patient ID: Linda Buckley, female    DOB: 1956-01-11, 59 y.o.   MRN: [id number]  HPI Pain- pain started ~1 week ago.  Having pain in L shoulder, radiating up into neck and into L arm.  L knee w/ prolonged sitting and/or weight bearing.  Pain described as a 'shooting pain'.  No improvement w/ percocet that she has for hand pain.  Temporary relief w/ icy hot.  No change in activity level.  Due to recent surgery on R hand, pt has been using L arm more.  Pains are migratory in nature and will travel up/down the arm.  No hx of similar.   Review of Systems For ROS see HPI     Objective:   Physical Exam  Constitutional: She is oriented to person, place, and time. She appears well-developed and well-nourished. No distress.  HENT:  Head: Normocephalic and atraumatic.  Neck: Normal range of motion.  L trap spasm  Cardiovascular: Normal rate, regular rhythm, normal heart sounds and intact distal pulses.   Pulmonary/Chest: Effort normal and breath sounds normal. No respiratory distress. She has no wheezes. She has no rales.  Musculoskeletal: She exhibits tenderness (TTP over R trap spasm, TTP of L distal quad.  no TTP over medial or lateral L knee joint lines).  No crepitus w/ flexion/extension of L knee, no pain  Neurological: She is alert and oriented to person, place, and time. She has normal reflexes. No cranial nerve deficit. Coordination normal.  Skin: Skin is warm and dry. No erythema.  Psychiatric: She has a normal mood and affect. Her behavior is normal.  Vitals reviewed.         Assessment & Plan:

## 2015-07-28 NOTE — Patient Instructions (Signed)
Follow up in 2 weeks if no improvement Start the Mobic once daily- take w/ food Use the Flexeril nightly for muscle spasm- will cause drowsiness.  Ok to use for days and weekends but will cause drowsiness Alternate ice/heat for pain relief Call with any questions or concerns Hang in there!

## 2015-07-30 NOTE — Assessment & Plan Note (Signed)
New.  Pt w/o true joint pain but TTP over L trap and L distal quad.  Suspect L trap spasm is due to overuse of L arm s/p surgery on R hand.  Start daily NSAID and flexeril.  If no improvement, will need rheum work up but she has previously had negative rheum workup.  Also must consider fibromyalgia.  Reviewed supportive care and red flags that should prompt return.

## 2015-08-07 ENCOUNTER — Encounter: Payer: Self-pay | Admitting: Family Medicine

## 2015-08-07 ENCOUNTER — Ambulatory Visit (INDEPENDENT_AMBULATORY_CARE_PROVIDER_SITE_OTHER): Payer: 59 | Admitting: Family Medicine

## 2015-08-07 VITALS — BP 132/84 | HR 90 | Temp 98.2°F | Resp 17 | Wt 239.5 lb

## 2015-08-07 DIAGNOSIS — M791 Myalgia, unspecified site: Secondary | ICD-10-CM

## 2015-08-07 MED ORDER — PREGABALIN 75 MG PO CAPS: 75.0000 mg | ORAL_CAPSULE | Freq: Two times a day (BID) | ORAL | Status: DC

## 2015-08-07 NOTE — Progress Notes (Signed)
   Subjective:    Patient ID: Linda Buckley, female    DOB: [birth year], 59 y.o.   MRN: [id number]  HPI Pain- 'i'm hurting'.  Pt was seen 10 days ago w/ L shoulder and L knee pain.  Pt now having shooting pain in L foot.  Shoulder pain persists.  Knee pain is intermittent.  Foot pain has only occurred on 2 episodes.  Had previously negative rheumatology w/u.  Pt reports pain is 'like an ache'.  'it's the muscle', 'nothing in the bone'.  No relief w/ mobic or flexeril.  No relief w/ pain meds given by ortho after recent surgery.   Review of Systems For ROS see HPI     Objective:   Physical Exam  Constitutional: She is oriented to person, place, and time. She appears well-developed and well-nourished. No distress.  HENT:  Head: Normocephalic and atraumatic.  Cardiovascular: Intact distal pulses.   Pulmonary/Chest: She exhibits tenderness (over trigger points).  Musculoskeletal: She exhibits tenderness (TTP over 14 trigger points- both R and L side). She exhibits no edema.  Neurological: She is alert and oriented to person, place, and time.  Skin: Skin is warm and dry. No rash noted. No erythema.  Psychiatric: She has a normal mood and affect. Her behavior is normal. Thought content normal.  Vitals reviewed.         Assessment & Plan:

## 2015-08-07 NOTE — Patient Instructions (Signed)
Follow up in 1 month to recheck pain We'll call you with your ortho appt Start the Lyrica twice daily to improve the pain Call with any questions or concerns Hang in there!!

## 2015-08-07 NOTE — Progress Notes (Signed)
Pre visit review using our clinic review tool, if applicable. No additional management support is needed unless otherwise documented below in the visit note. 

## 2015-08-07 NOTE — Assessment & Plan Note (Signed)
Ongoing issue for pt.  Had previous negative rheumatologic work up.  No pain relief from NSAID, muscle relaxers, pain meds.  Pt w/ 14 + trigger points.  Hx of depression, tx'd w/ Lexapro.  Recommended switching to Cymbalta.  Pt not interested.  Pt willing to start Lyrica and see if sxs improve.  Refer back to sports med to determine if additional w/u is required.  Will continue to follow closely

## 2015-08-09 ENCOUNTER — Telehealth: Payer: Self-pay | Admitting: Family Medicine

## 2015-08-09 ENCOUNTER — Telehealth: Payer: Self-pay | Admitting: *Deleted

## 2015-08-09 NOTE — Telephone Encounter (Signed)
Please call and schedule pt. Ok to use a Same day.

## 2015-08-09 NOTE — Telephone Encounter (Signed)
Needs appt

## 2015-08-09 NOTE — Telephone Encounter (Signed)
LM for pt to call and schedule appt. °

## 2015-08-09 NOTE — Telephone Encounter (Signed)
Caller name: Rainelle Sulewski Relationship to patient: self Can be reached: [phone] Pharmacy: [facility] on [address]  Reason for call: Pt feeling stressed and depressed with all the pains she is having. She states she is crying all the time. She is up all day and night. She is asking if there is anything we can do?

## 2015-08-09 NOTE — Telephone Encounter (Signed)
Pt needs appt correct?

## 2015-08-09 NOTE — Telephone Encounter (Signed)
Patient will be out of town returning 08/11/2015. Patient is scheduled for 08/11/15 at 2pm

## 2015-08-09 NOTE — Telephone Encounter (Signed)
Prior auth for Lyrica initiated. Awaiting determination. JG//CMA

## 2015-08-10 NOTE — Telephone Encounter (Signed)
PA approved effective until 08/08/2017. File ID: [id number]. Approval letter sent for scanning. JG//CMA

## 2015-08-11 ENCOUNTER — Ambulatory Visit: Payer: 59 | Admitting: Family Medicine

## 2015-08-11 ENCOUNTER — Telehealth: Payer: Self-pay | Admitting: Family Medicine

## 2015-08-11 DIAGNOSIS — Z0289 Encounter for other administrative examinations: Secondary | ICD-10-CM

## 2015-08-11 NOTE — Telephone Encounter (Signed)
Pt was no show today 08/11/15 2:00pm acute for depression - 8/25 KO conf with pt - had to add appt back in bc I accidentally cancelled it just now - received VM 08/11/15 12:15pm to cancel appt, no reason given - charge for no show?

## 2015-08-11 NOTE — Telephone Encounter (Signed)
Yes- please charge 

## 2015-08-28 ENCOUNTER — Encounter: Payer: Self-pay | Admitting: Family Medicine

## 2015-08-28 ENCOUNTER — Ambulatory Visit (INDEPENDENT_AMBULATORY_CARE_PROVIDER_SITE_OTHER): Payer: 59 | Admitting: Family Medicine

## 2015-08-28 VITALS — BP 134/80 | HR 80 | Temp 98.0°F | Resp 16 | Wt 239.2 lb

## 2015-08-28 DIAGNOSIS — F329 Major depressive disorder, single episode, unspecified: Secondary | ICD-10-CM | POA: Diagnosis not present

## 2015-08-28 DIAGNOSIS — M791 Myalgia, unspecified site: Secondary | ICD-10-CM

## 2015-08-28 DIAGNOSIS — F32A Depression, unspecified: Secondary | ICD-10-CM

## 2015-08-28 MED ORDER — DULOXETINE HCL 20 MG PO CPEP: 20.0000 mg | ORAL_CAPSULE | Freq: Every day | ORAL | Status: DC

## 2015-08-28 NOTE — Assessment & Plan Note (Signed)
Mild improvement on Lyrica but pt having difficulty w/ sleepiness.  Based on this and her depression, will stop Lyrica and switch to Cymbalta for both pain and depression control.  Pt expressed understanding and is in agreement w/ plan.

## 2015-08-28 NOTE — Assessment & Plan Note (Signed)
New.  Had discussion w/ pt that physical and emotional health go hand in hand and with her ongoing pain, it is not unusual to become depressed.  Also discussed that depression can, in turn, worsen her pain.  Based on this, will stop Lyrica and start Cymbalta to address both pain and depression.  Pt expressed understanding and is in agreement w/ plan.

## 2015-08-28 NOTE — Progress Notes (Signed)
   Subjective:    Patient ID: [name], female    DOB: 07-24-1956, 59 y.o.   MRN: [id number]  HPI Myalgia- pt was started on Lyrica at last visit.  'i do think that it's helping but it makes me sleepy'.  Pt lost her job b/c of time missed.  Pain in L arm has improved w/ medication but continues.  She has MRI upcoming w/ ortho.  Pt is tearful, stressed, 'i'm just so depressed'.  Reports issues w/ memory and concentration at this time.   Review of Systems For ROS see HPI     Objective:   Physical Exam  Constitutional: She is oriented to person, place, and time. She appears well-developed and well-nourished. No distress.  HENT:  Head: Normocephalic and atraumatic.  Eyes: Conjunctivae and EOM are normal. Pupils are equal, round, and reactive to light.  Musculoskeletal: She exhibits tenderness (TTP over multiple trigger points). She exhibits no edema.  Neurological: She is alert and oriented to person, place, and time.  Skin: Skin is warm and dry.  Psychiatric:  Tearful at times but otherwise flat affect  Vitals reviewed.         Assessment & Plan:

## 2015-08-28 NOTE — Progress Notes (Signed)
Pre visit review using our clinic review tool, if applicable. No additional management support is needed unless otherwise documented below in the visit note. 

## 2015-08-28 NOTE — Patient Instructions (Signed)
Follow up in 3-4 weeks to recheck mood and pain levels STOP the Lyrica START the Cymbalta once daily.  This is a low dose and we may need to increase the dose at our next visit Continue to follow the treatment plan as directed by Ortho Call with any questions or concerns Hang in there!!!

## 2015-08-30 ENCOUNTER — Other Ambulatory Visit: Payer: Self-pay | Admitting: Family Medicine

## 2015-08-31 NOTE — Telephone Encounter (Signed)
Last OV 08/28/15 Clonazepam last filled 04/05/15 #30 with 1

## 2015-08-31 NOTE — Telephone Encounter (Signed)
Medication filled to pharmacy as requested.   

## 2015-09-11 ENCOUNTER — Ambulatory Visit: Payer: 59 | Admitting: Family Medicine

## 2015-09-25 ENCOUNTER — Encounter: Payer: Self-pay | Admitting: Family Medicine

## 2015-09-25 ENCOUNTER — Ambulatory Visit (INDEPENDENT_AMBULATORY_CARE_PROVIDER_SITE_OTHER): Payer: 59 | Admitting: Family Medicine

## 2015-09-25 VITALS — BP 117/75 | HR 68 | Temp 98.0°F | Resp 16 | Ht 66.5 in | Wt 233.1 lb

## 2015-09-25 DIAGNOSIS — M791 Myalgia, unspecified site: Secondary | ICD-10-CM

## 2015-09-25 MED ORDER — CLONAZEPAM 0.5 MG PO TABS: 0.5000 mg | ORAL_TABLET | Freq: Every day | ORAL | Status: DC

## 2015-09-25 MED ORDER — DULOXETINE HCL 30 MG PO CPEP: 30.0000 mg | ORAL_CAPSULE | Freq: Every day | ORAL | Status: DC

## 2015-09-25 MED ORDER — MELOXICAM 15 MG PO TABS: 15.0000 mg | ORAL_TABLET | Freq: Every day | ORAL | Status: DC

## 2015-09-25 NOTE — Assessment & Plan Note (Signed)
Pt reports that Ortho agrees w/ dx of fibromyalgia.  Some of her pains have mildly improved since starting low dose Cymbalta and mood is also improving.  Based on this, will increase Cymbalta to 30mg  daily.  Restart daily NSAID- script for mobic sent.  Will continue to follow closely.

## 2015-09-25 NOTE — Progress Notes (Signed)
   Subjective:    Patient ID: Linda Buckley, female    DOB: 1956-10-16, 59 y.o.   MRN: [id number]  HPI Myalgia- pt reports she is 'a little better' since last visit when she started Cymbalta 20mg  daily.  Pt reports her body aches are improving and mood is somewhat better despite her ongoing issues w/ C5-6 (seeing [facility]).  Pt is not currently taking muscle relaxer or NSAID.   Review of Systems For ROS see HPI     Objective:   Physical Exam  Constitutional: She is oriented to person, place, and time. She appears well-developed and well-nourished. No distress.  HENT:  Head: Normocephalic and atraumatic.  Musculoskeletal: She exhibits tenderness (TTP over multiple trigger points but less tender than previous).  Neurological: She is alert and oriented to person, place, and time.  Skin: Skin is warm and dry.  Psychiatric: She has a normal mood and affect. Her behavior is normal. Thought content normal.  Vitals reviewed.         Assessment & Plan:

## 2015-09-25 NOTE — Progress Notes (Signed)
Pre visit review using our clinic review tool, if applicable. No additional management support is needed unless otherwise documented below in the visit note/SLS  

## 2015-09-25 NOTE — Patient Instructions (Addendum)
Follow up in 6-8 weeks to recheck mood/pain Increase the Cymbalta to 30mg  daily (new prescription at the pharmacy) Restart the Mobic (Meloxicam) once daily- take w/ food- for inflammation Keep up the good work!  You've got this! Call with any questions or concerns If you want to join [name] at the new [place] office, any scheduled appointments will automatically transfer and we will see you at [address] [name] [address] [place], [place], [place] [address]  Hang in there!!!

## 2015-10-23 ENCOUNTER — Telehealth: Payer: Self-pay | Admitting: Behavioral Health

## 2015-10-23 ENCOUNTER — Encounter: Payer: Self-pay | Admitting: Behavioral Health

## 2015-10-23 NOTE — Telephone Encounter (Signed)
Unable to reach patient at time of Pre-Visit Call.  Left message for patient to return call when available.    

## 2015-10-23 NOTE — Telephone Encounter (Signed)
Pre-Visit Call completed with patient and chart updated.   Pre-Visit Info documented in Specialty Comments under [facility].    

## 2015-10-23 NOTE — Addendum Note (Signed)
Addended by: Kathlen Brunswick on: 10/23/2015 09:57 AM   Modules accepted: Medications

## 2015-10-24 ENCOUNTER — Other Ambulatory Visit: Payer: Self-pay | Admitting: General Practice

## 2015-10-24 ENCOUNTER — Ambulatory Visit (INDEPENDENT_AMBULATORY_CARE_PROVIDER_SITE_OTHER): Payer: 59 | Admitting: Family Medicine

## 2015-10-24 ENCOUNTER — Encounter: Payer: Self-pay | Admitting: Family Medicine

## 2015-10-24 ENCOUNTER — Other Ambulatory Visit: Payer: Self-pay | Admitting: Family Medicine

## 2015-10-24 VITALS — BP 118/80 | HR 68 | Temp 98.0°F | Resp 16 | Ht 67.0 in | Wt 230.5 lb

## 2015-10-24 DIAGNOSIS — F32A Depression, unspecified: Secondary | ICD-10-CM

## 2015-10-24 DIAGNOSIS — Z0000 Encounter for general adult medical examination without abnormal findings: Secondary | ICD-10-CM | POA: Diagnosis not present

## 2015-10-24 DIAGNOSIS — F329 Major depressive disorder, single episode, unspecified: Secondary | ICD-10-CM

## 2015-10-24 DIAGNOSIS — Z23 Encounter for immunization: Secondary | ICD-10-CM | POA: Diagnosis not present

## 2015-10-24 LAB — LIPID PANEL
CHOL/HDL RATIO: 3
Cholesterol: 168 mg/dL (ref 0–200)
HDL: 48.6 mg/dL (ref >39.00)
LDL CALC: 104 mg/dL — AB (ref 0–99)
NonHDL: 119.44
TRIGLYCERIDES: 76 mg/dL (ref 0.0–149.0)
VLDL: 15.2 mg/dL (ref 0.0–40.0)

## 2015-10-24 LAB — HEPATIC FUNCTION PANEL
ALT: 10 U/L (ref 0–35)
AST: 13 U/L (ref 0–37)
Albumin: 3.9 g/dL (ref 3.5–5.2)
Alkaline Phosphatase: 57 U/L (ref 39–117)
Bilirubin, Direct: 0.1 mg/dL (ref 0.0–0.3)
TOTAL PROTEIN: 6.7 g/dL (ref 6.0–8.3)
Total Bilirubin: 0.4 mg/dL (ref 0.2–1.2)

## 2015-10-24 LAB — CBC WITH DIFFERENTIAL/PLATELET
BASOS ABS: 0 10*3/uL (ref 0.0–0.1)
Basophils Relative: 0.4 % (ref 0.0–3.0)
Eosinophils Absolute: 0.1 10*3/uL (ref 0.0–0.7)
Eosinophils Relative: 2.8 % (ref 0.0–5.0)
HCT: 39.9 % (ref 36.0–46.0)
Hemoglobin: 13.1 g/dL (ref 12.0–15.0)
LYMPHS PCT: 31.5 % (ref 12.0–46.0)
Lymphs Abs: 1.1 10*3/uL (ref 0.7–4.0)
MCHC: 32.9 g/dL (ref 30.0–36.0)
MCV: 91.8 fl (ref 78.0–100.0)
MONO ABS: 0.3 10*3/uL (ref 0.1–1.0)
Monocytes Relative: 8.8 % (ref 3.0–12.0)
NEUTROS PCT: 56.5 % (ref 43.0–77.0)
Neutro Abs: 2.1 10*3/uL (ref 1.4–7.7)
Platelets: 237 10*3/uL (ref 150.0–400.0)
RBC: 4.34 Mil/uL (ref 3.87–5.11)
RDW: 12.6 % (ref 11.5–15.5)
WBC: 3.6 10*3/uL — AB (ref 4.0–10.5)

## 2015-10-24 LAB — BASIC METABOLIC PANEL
BUN: 15 mg/dL (ref 6–23)
CALCIUM: 9.6 mg/dL (ref 8.4–10.5)
CO2: 31 mEq/L (ref 19–32)
CREATININE: 0.84 mg/dL (ref 0.40–1.20)
Chloride: 106 mEq/L (ref 96–112)
GFR: 89.86 mL/min (ref >60.00)
GLUCOSE: 96 mg/dL (ref 70–99)
Potassium: 3.8 mEq/L (ref 3.5–5.1)
Sodium: 144 mEq/L (ref 135–145)

## 2015-10-24 LAB — TSH: TSH: 0.72 u[IU]/mL (ref 0.35–4.50)

## 2015-10-24 LAB — VITAMIN D 25 HYDROXY (VIT D DEFICIENCY, FRACTURES): VITD: 20.17 ng/mL — ABNORMAL LOW (ref 30.00–100.00)

## 2015-10-24 MED ORDER — VITAMIN D (ERGOCALCIFEROL) 1.25 MG (50000 UNIT) PO CAPS: 50000.0000 [IU] | ORAL_CAPSULE | ORAL | Status: DC

## 2015-10-24 NOTE — Progress Notes (Signed)
Pre visit review using our clinic review tool, if applicable. No additional management support is needed unless otherwise documented below in the visit note. 

## 2015-10-24 NOTE — Telephone Encounter (Signed)
Medication filled to pharmacy as requested.   

## 2015-10-24 NOTE — Patient Instructions (Signed)
Follow up in 6 months to recheck BP We'll notify you of your lab results and make any changes if needed Continue to work on healthy food choices and regular exercise- you look great! Call with any questions or concerns If you want to join [name] at the new [place] office, any scheduled appointments will automatically transfer and we will see you at [address] [name] [address] [place], [place] [address]  Happy Holidays!!!

## 2015-10-24 NOTE — Assessment & Plan Note (Signed)
Pt's PE WNL w/ exception of obesity.  Pt reports she has changed diet and started exercising twice weekly.  UTD on mammo, colonoscopy, no need for pap due to hysterectomy.  Check labs.  Flu shot given.  Anticipatory guidance provided.

## 2015-10-24 NOTE — Progress Notes (Signed)
   Subjective:    Patient ID: Linda Buckley, female    DOB: Aug 06, 1956, 59 y.o.   MRN: [id number]  HPI CPE- UTD on colonoscopy, mammo.  No need for paps due to hysterectomy.   Review of Systems Patient reports no vision/ hearing changes, adenopathy,fever, weight change,  persistant/recurrent hoarseness , swallowing issues, chest pain, palpitations, edema, persistant/recurrent cough, hemoptysis, dyspnea (rest/exertional/paroxysmal nocturnal), gastrointestinal bleeding (melena, rectal bleeding), abdominal pain, significant heartburn, bowel changes, GU symptoms (dysuria, hematuria, incontinence), Gyn symptoms (abnormal  bleeding, pain),  syncope, focal weakness, memory loss, numbness & tingling, skin/hair/nail changes, abnormal bruising or bleeding, anxiety, or depression. +    Objective:   Physical Exam General Appearance:    Alert, cooperative, no distress, appears stated age, obese  Head:    Normocephalic, without obvious abnormality, atraumatic  Eyes:    PERRL, conjunctiva/corneas clear, EOM's intact, fundi    benign, both eyes  Ears:    Normal TM's and external ear canals, both ears  Nose:   Nares normal, septum midline, mucosa normal, no drainage    or sinus tenderness  Throat:   Lips, mucosa, and tongue normal; teeth and gums normal  Neck:   Supple, symmetrical, trachea midline, no adenopathy;    Thyroid: no enlargement/tenderness/nodules  Back:     Symmetric, no curvature, ROM normal, no CVA tenderness  Lungs:     Clear to auscultation bilaterally, respirations unlabored  Chest Wall:    No tenderness or deformity   Heart:    Regular rate and rhythm, S1 and S2 normal, no murmur, rub   or gallop  Breast Exam:    Deferred to mammo  Abdomen:     Soft, non-tender, bowel sounds active all four quadrants,    no masses, no organomegaly  Genitalia:    Deferred  Rectal:    Extremities:   Extremities normal, atraumatic, no cyanosis or edema  Pulses:   2+ and symmetric all extremities    Skin:   Skin color, texture, turgor normal, no rashes or lesions  Lymph nodes:   Cervical, supraclavicular, and axillary nodes normal  Neurologic:   CNII-XII intact, normal strength, sensation and reflexes    throughout          Assessment & Plan:

## 2015-10-24 NOTE — Assessment & Plan Note (Signed)
Pt is doing better since starting Cymbalta.  She had some decline in mood last month but restarted her Lexapro (in addition to Cymbalta) and reports feeling much better.  I discussed that it is not traditional for pt to be on both SSRI and SNRI but since both are at low doses and she is feeling much better, I am hesitant to change it.  We reviewed risk of serotonin syndrome and what to look for.  Pt aware and would like to continue on current regimen.

## 2015-11-14 ENCOUNTER — Other Ambulatory Visit (HOSPITAL_COMMUNITY): Payer: Self-pay | Admitting: Orthopedic Surgery

## 2015-11-14 ENCOUNTER — Telehealth: Payer: Self-pay | Admitting: *Deleted

## 2015-11-14 DIAGNOSIS — M436 Torticollis: Secondary | ICD-10-CM

## 2015-11-14 NOTE — Telephone Encounter (Signed)
Surgical clearance form received from [facility] for c6-7 disc replacement that has yet to be scheduled. Does pt need an OV with you? She had a CPE the beginning of this month. Please advise. JG//CMA

## 2015-11-14 NOTE — Telephone Encounter (Signed)
Ok for surgery as pt had CPE last month and EKG in July

## 2015-11-15 ENCOUNTER — Ambulatory Visit (HOSPITAL_COMMUNITY)
Admission: RE | Admit: 2015-11-15 | Discharge: 2015-11-15 | Disposition: A | Discharge status: Home / Self Care | Payer: 59 | Source: Ambulatory Visit | Attending: Family Medicine | Admitting: Family Medicine

## 2015-11-15 DIAGNOSIS — M436 Torticollis: Secondary | ICD-10-CM | POA: Insufficient documentation

## 2015-11-15 DIAGNOSIS — M79603 Pain in arm, unspecified: Secondary | ICD-10-CM | POA: Insufficient documentation

## 2015-11-15 NOTE — Progress Notes (Signed)
*  PRELIMINARY RESULTS* Vascular Ultrasound Carotid Duplex (Doppler) has been completed.  Findings suggest 1-39% internal carotid artery stenosis bilaterally. Vertebral arteries are patent with antegrade flow.  11/15/2015 10:57 AM Maudry Mayhew, RVT, RDCS, RDMS

## 2015-11-15 NOTE — Telephone Encounter (Signed)
Form faxed to [facility] successfully. Sent for scanning. JG//CMA

## 2015-11-15 NOTE — Telephone Encounter (Signed)
Form placed in your red folder. Thanks.

## 2015-11-15 NOTE — Telephone Encounter (Signed)
Form completed and placed in [facility] folder

## 2015-12-15 ENCOUNTER — Telehealth: Payer: Self-pay | Admitting: Family Medicine

## 2015-12-15 NOTE — Telephone Encounter (Signed)
[facility]  Patient Name: Linda Buckley  DOB: [birth year]    Initial Comment Caller States Leg pain, and arm pain, wanting appt also    Nurse Assessment  Nurse: Harlow Mares, RN, Suanne Marker Date/Time Eilene Ghazi Time): 12/15/2015 2:57:03 PM  Confirm and document reason for call. If symptomatic, describe symptoms. ---Caller States Leg pain, and arm pain, wanting appt also. Reports symptoms began several weeks ago and she is scheduled to have surgery on 12/03/16 (surgery of the arm). Reports L leg and R arm is hurting today. Both legs and feet are hurting. Worse today. Was losing balance earlier in the week. Denies current dizziness. Reports that she is currently taking oxycodone q 6 hrs. Pain is in both legs.  Has the patient traveled out of the country within the last 30 days? ---Not Applicable  Does the patient have any new or worsening symptoms? ---Yes  Will a triage be completed? ---Yes  Related visit to physician within the last 2 weeks? ---Yes  Does the PT have any chronic conditions? (i.e. diabetes, asthma, etc.) ---Yes  List chronic conditions. ---hypertension  Is this a behavioral health or substance abuse call? ---No     Guidelines    Guideline Title Affirmed Question Affirmed Notes  Leg Pain [1] MILD pain (e.g., does not interfere with normal activities) AND [2] present > 7 days    Final Disposition User   See PCP within 2 Tillman Abide, RN, Suanne Marker    Comments  Caller scheduled to see Dr. Birdie Riddle at the [facility] office on 12/19/15 @ 9:45 am.   Referrals  REFERRED TO PCP OFFICE   Disagree/Comply: Leta Baptist

## 2015-12-15 NOTE — Telephone Encounter (Signed)
Patient has appointment scheduled for 12/18/14 with Dr. Birdie Riddle. Advised patient to go to ED or UC if condition worsens over the weekend or before appointment.PAtient agreed.

## 2015-12-19 ENCOUNTER — Encounter: Payer: Self-pay | Admitting: Family Medicine

## 2015-12-19 ENCOUNTER — Ambulatory Visit (INDEPENDENT_AMBULATORY_CARE_PROVIDER_SITE_OTHER): Payer: BLUE CROSS/BLUE SHIELD | Admitting: Family Medicine

## 2015-12-19 VITALS — BP 118/82 | HR 83 | Temp 98.0°F | Resp 17 | Ht 67.0 in | Wt 218.5 lb

## 2015-12-19 DIAGNOSIS — M791 Myalgia, unspecified site: Secondary | ICD-10-CM

## 2015-12-19 DIAGNOSIS — M7061 Trochanteric bursitis, right hip: Secondary | ICD-10-CM | POA: Diagnosis not present

## 2015-12-19 DIAGNOSIS — M7062 Trochanteric bursitis, left hip: Secondary | ICD-10-CM

## 2015-12-19 DIAGNOSIS — M160 Bilateral primary osteoarthritis of hip: Secondary | ICD-10-CM | POA: Insufficient documentation

## 2015-12-19 LAB — CBC WITH DIFFERENTIAL/PLATELET
BASOS ABS: 0 10*3/uL (ref 0.0–0.1)
Basophils Relative: 0.5 % (ref 0.0–3.0)
EOS ABS: 0.1 10*3/uL (ref 0.0–0.7)
Eosinophils Relative: 2.2 % (ref 0.0–5.0)
HEMATOCRIT: 40.6 % (ref 36.0–46.0)
HEMOGLOBIN: 13.3 g/dL (ref 12.0–15.0)
LYMPHS PCT: 24.8 % (ref 12.0–46.0)
Lymphs Abs: 1.1 10*3/uL (ref 0.7–4.0)
MCHC: 32.8 g/dL (ref 30.0–36.0)
MCV: 90.5 fl (ref 78.0–100.0)
Monocytes Absolute: 0.3 10*3/uL (ref 0.1–1.0)
Monocytes Relative: 6.3 % (ref 3.0–12.0)
Neutro Abs: 3 10*3/uL (ref 1.4–7.7)
Neutrophils Relative %: 66.2 % (ref 43.0–77.0)
Platelets: 240 10*3/uL (ref 150.0–400.0)
RBC: 4.48 Mil/uL (ref 3.87–5.11)
RDW: 12.6 % (ref 11.5–15.5)
WBC: 4.5 10*3/uL (ref 4.0–10.5)

## 2015-12-19 LAB — BASIC METABOLIC PANEL
BUN: 11 mg/dL (ref 6–23)
CALCIUM: 10 mg/dL (ref 8.4–10.5)
CHLORIDE: 102 meq/L (ref 96–112)
CO2: 30 mEq/L (ref 19–32)
CREATININE: 0.78 mg/dL (ref 0.40–1.20)
GFR: 97.83 mL/min (ref >60.00)
Glucose, Bld: 127 mg/dL — ABNORMAL HIGH (ref 70–99)
Potassium: 3.9 mEq/L (ref 3.5–5.1)
Sodium: 139 mEq/L (ref 135–145)

## 2015-12-19 LAB — CK: CK TOTAL: 53 U/L (ref 7–177)

## 2015-12-19 MED ORDER — ALBUTEROL SULFATE HFA 108 (90 BASE) MCG/ACT IN AERS: 2.0000 | INHALATION_SPRAY | RESPIRATORY_TRACT | Status: DC | PRN

## 2015-12-19 MED ORDER — PREDNISONE 10 MG PO TABS: ORAL_TABLET | ORAL | Status: DC

## 2015-12-19 NOTE — Assessment & Plan Note (Addendum)
New.  Pt's sxs and PE consistent w/ bilateral trochanteric bursitis.  + TTP.  No red flags on hx or PE.  D/c current pain meds and NSAIDs.  Start Pred taper.  Alternate ice/heat.  If no improvement, will refer to Ortho for complete evaluation and tx.

## 2015-12-19 NOTE — Assessment & Plan Note (Signed)
Recurring issue for pt.  Suspect that her current discomfort is due to bursitis but since she was told at the time of her surgery that her potassium is low, I will check labs to r/o metabolic causes.  Pt to start Pred taper.  Alternate ice/heat.  If no improvement will refer back to ortho.  Pt expressed understanding and is in agreement w/ plan.

## 2015-12-19 NOTE — Progress Notes (Signed)
Pre visit review using our clinic review tool, if applicable. No additional management support is needed unless otherwise documented below in the visit note. 

## 2015-12-19 NOTE — Patient Instructions (Signed)
Follow up as needed We'll notify you of your lab results and make any changes if needed Start the Prednisone as directed- take w/ food Avoid other anti-inflammatories such as Mobic, Advil, Aleve while on the Prednisone Drink plenty of fluids Alternate ice and heat on your hips for pain improvement If not better after the prednisone, we will need to have you see Ortho Call with any questions or concerns Hang in there! Happy New Year!

## 2015-12-19 NOTE — Progress Notes (Signed)
   Subjective:    Patient ID: Linda Buckley, female    DOB: 05/23/1956, 60 y.o.   MRN: [id number]  HPI Leg pain- bilateral, described as a 'throbbing' pain that originates on lateral hips and extends downward.  Pain goes all the way to feet.  sxs started 'several weeks' ago.  No improvement w/ Oxycodone, Robaxin, or Meloxicam.  Some improvement w/ direct pressure.  Worse w/ walking or weight bearing.  No bowel or bladder incontinence.  No numbness of legs.   Review of Systems For ROS see HPI     Objective:   Physical Exam  Constitutional: She is oriented to person, place, and time. She appears well-developed and well-nourished. No distress.  HENT:  Head: Normocephalic and atraumatic.  Neck:  Neck brace in place  Cardiovascular: Intact distal pulses.   Musculoskeletal: She exhibits tenderness (TTP over bilateral greater trochanteric bursa- L>R).  Neurological: She is alert and oriented to person, place, and time. She has normal reflexes. No cranial nerve deficit. Coordination normal.  (-) SLR bilaterally  Skin: Skin is warm and dry. No erythema.  Psychiatric: She has a normal mood and affect. Her behavior is normal. Thought content normal.  Vitals reviewed.         Assessment & Plan:

## 2016-01-03 ENCOUNTER — Other Ambulatory Visit: Payer: BLUE CROSS/BLUE SHIELD

## 2016-01-03 ENCOUNTER — Ambulatory Visit
Admission: RE | Admit: 2016-01-03 | Discharge: 2016-01-03 | Disposition: A | Discharge status: Home / Self Care | Payer: BLUE CROSS/BLUE SHIELD | Source: Ambulatory Visit | Attending: Orthopedic Surgery | Admitting: Orthopedic Surgery

## 2016-01-03 ENCOUNTER — Other Ambulatory Visit: Payer: Self-pay | Admitting: Orthopedic Surgery

## 2016-01-03 DIAGNOSIS — Z4789 Encounter for other orthopedic aftercare: Secondary | ICD-10-CM

## 2016-01-25 ENCOUNTER — Telehealth: Payer: Self-pay | Admitting: Family Medicine

## 2016-01-25 ENCOUNTER — Other Ambulatory Visit: Payer: Self-pay | Admitting: Family Medicine

## 2016-01-25 NOTE — Telephone Encounter (Signed)
Last OV 12-19-15 Clonazepam last filled 09/25/15 #30 with 3

## 2016-01-25 NOTE — Telephone Encounter (Signed)
Medication filled to pharmacy as requested.   

## 2016-01-25 NOTE — Telephone Encounter (Signed)
Patient Name: Linda Buckley  DOB: 07/30/1956    Initial Comment Caller states having shortness of breath.   Nurse Assessment  Nurse: Raphael Gibney, RN, Vanita Ingles Date/Time (Eastern Time): 01/25/2016 2:04:53 PM  Confirm and document reason for call. If symptomatic, describe symptoms. You must click the next button to save text entered. ---Caller states she is having SOB. Has SOB, when she walk a few steps. No cough, no nasal congestion. No fever. Has pain on the right side of her chest occasionally. No chest pain right now. Had cervical surgery Dec 19.  Has the patient traveled out of the country within the last 30 days? ---Not Applicable  Does the patient have any new or worsening symptoms? ---Yes  Will a triage be completed? ---Yes  Related visit to physician within the last 2 weeks? ---No  Does the PT have any chronic conditions? (i.e. diabetes, asthma, etc.) ---Yes  List chronic conditions. ---cervical surgery; HTN  Is this a behavioral health or substance abuse call? ---No     Guidelines    Guideline Title Affirmed Question Affirmed Notes  Breathing Difficulty [1] MILD difficulty breathing (e.g., minimal/no SOB at rest, SOB with walking, pulse <100) AND [2] NEW-onset or WORSE than normal    Final Disposition User   See Physician within 4 Hours (or PCP triage) Raphael Gibney, RN, Vera    Comments  pt states she can not come this afternoon as her husband has gone to work with the car. Does not want to go to urgent care or the ER. Would like call back for appt tomorrow.  Pt states she sees Dr. Birdie Riddle   Referrals  GO TO FACILITY REFUSED   Disagree/Comply: Disagree  Disagree/Comply Reason: Unable to find transportation

## 2016-01-25 NOTE — Telephone Encounter (Signed)
Patient called stating that she is having Shortness of Breath x 3 weeks and it is now worse. Transferred patient to [facility]. Spoke with Maudie Mercury

## 2016-01-25 NOTE — Telephone Encounter (Signed)
Pt states she gets short of breath whenever she starts to do light housework.  She also says that she has bilateral leg weakness and pains and mild swelling in her ankles.  She continues deny chest pain/pressure, cough, congestion, fever, etc. Pt did not appear to be in acute distress over the phone.    Appt scheduled for tomorrow (01/25/16) at 9:45 am with Dr. Birdie Riddle.  She was advised if symptoms worsen or new symptoms develop to go to ER.  Pt stated understanding and agreed.

## 2016-01-26 ENCOUNTER — Ambulatory Visit (INDEPENDENT_AMBULATORY_CARE_PROVIDER_SITE_OTHER): Payer: BLUE CROSS/BLUE SHIELD | Admitting: Family Medicine

## 2016-01-26 ENCOUNTER — Encounter: Payer: Self-pay | Admitting: Family Medicine

## 2016-01-26 ENCOUNTER — Ambulatory Visit (HOSPITAL_BASED_OUTPATIENT_CLINIC_OR_DEPARTMENT_OTHER)
Admission: RE | Admit: 2016-01-26 | Discharge: 2016-01-26 | Disposition: A | Discharge status: Home / Self Care | Payer: BLUE CROSS/BLUE SHIELD | Source: Ambulatory Visit | Attending: Family Medicine | Admitting: Family Medicine

## 2016-01-26 VITALS — BP 124/90 | HR 67 | Temp 98.0°F | Ht 67.0 in | Wt 221.8 lb

## 2016-01-26 DIAGNOSIS — M79605 Pain in left leg: Secondary | ICD-10-CM | POA: Diagnosis not present

## 2016-01-26 DIAGNOSIS — R0602 Shortness of breath: Secondary | ICD-10-CM | POA: Insufficient documentation

## 2016-01-26 DIAGNOSIS — R079 Chest pain, unspecified: Secondary | ICD-10-CM | POA: Diagnosis not present

## 2016-01-26 DIAGNOSIS — R05 Cough: Secondary | ICD-10-CM | POA: Insufficient documentation

## 2016-01-26 LAB — BASIC METABOLIC PANEL
BUN: 27 mg/dL — AB (ref 6–23)
CHLORIDE: 106 meq/L (ref 96–112)
CO2: 30 meq/L (ref 19–32)
Calcium: 9.5 mg/dL (ref 8.4–10.5)
Creatinine, Ser: 1.06 mg/dL (ref 0.40–1.20)
GFR: 68.64 mL/min (ref >60.00)
GLUCOSE: 91 mg/dL (ref 70–99)
POTASSIUM: 3.7 meq/L (ref 3.5–5.1)
SODIUM: 141 meq/L (ref 135–145)

## 2016-01-26 LAB — TSH: TSH: 0.61 u[IU]/mL (ref 0.35–4.50)

## 2016-01-26 MED ORDER — RANITIDINE HCL 300 MG PO TABS: 300.0000 mg | ORAL_TABLET | Freq: Every day | ORAL | Status: DC

## 2016-01-26 NOTE — Patient Instructions (Signed)
Follow up as needed We'll notify you of your lab results and make any changes if needed Go downstairs and get your chest xray after the labs Start the Ranitidine nightly for possible esophageal spasm/silent reflux that could be causing your R sided chest pain Try and continue to build your stamina with walking We'll call you with your ortho appt for your leg pain If your shortness of breath worsens or fails to improve- please let me know so we can continue the workup Call with any questions or concerns Hang in there!!

## 2016-01-26 NOTE — Progress Notes (Signed)
   Subjective:    Patient ID: Linda Buckley, female    DOB: [birth year], 58 y.o.   MRN: [id number]  HPI 'i'm tired, i'm short of breath, and my legs are killing me'- leg pain is a chronic problem for pt.  Seeing [facility] for her back pain.  Has not mentioned her legs.  Pt reports SOB started ~1 week ago.  SOB occurs w/ mild exertion.  Denies SOB at rest.  Mild R sided CP intermittently but not associated w/ SOB.  Pain is described as sharp.  Mild cough.  No fevers- denies cold sxs.   Review of Systems For ROS see HPI     Objective:   Physical Exam  Constitutional: She is oriented to person, place, and time. She appears well-developed and well-nourished. No distress.  HENT:  Head: Normocephalic and atraumatic.  Eyes: Conjunctivae and EOM are normal. Pupils are equal, round, and reactive to light.  Neck: Normal range of motion. Neck supple. No thyromegaly present.  Cardiovascular: Normal rate, regular rhythm, normal heart sounds and intact distal pulses.   No murmur heard. Pulmonary/Chest: Effort normal and breath sounds normal. No respiratory distress.  Abdominal: Soft. She exhibits no distension. There is no tenderness.  Musculoskeletal: She exhibits no edema.  Lymphadenopathy:    She has no cervical adenopathy.  Neurological: She is alert and oriented to person, place, and time.  Skin: Skin is warm and dry.  Psychiatric: She has a normal mood and affect. Her behavior is normal.  Vitals reviewed.         Assessment & Plan:

## 2016-01-26 NOTE — Progress Notes (Signed)
Pre visit review using our clinic review tool, if applicable. No additional management support is needed unless otherwise documented below in the visit note. 

## 2016-01-27 ENCOUNTER — Other Ambulatory Visit: Payer: Self-pay | Admitting: Family Medicine

## 2016-01-27 LAB — CBC WITH DIFFERENTIAL/PLATELET
BASOS ABS: 0 10*3/uL (ref 0.0–0.1)
BASOS PCT: 0.2 % (ref 0.0–3.0)
EOS PCT: 1.8 % (ref 0.0–5.0)
Eosinophils Absolute: 0.1 10*3/uL (ref 0.0–0.7)
HEMATOCRIT: 38.4 % (ref 36.0–46.0)
Hemoglobin: 11.6 g/dL — ABNORMAL LOW (ref 12.0–15.0)
LYMPHS PCT: 22.8 % (ref 12.0–46.0)
Lymphs Abs: 1 10*3/uL (ref 0.7–4.0)
MCHC: 30.2 g/dL (ref 30.0–36.0)
MCV: 98.7 fl (ref 78.0–100.0)
MONOS PCT: 6.9 % (ref 3.0–12.0)
Monocytes Absolute: 0.3 10*3/uL (ref 0.1–1.0)
NEUTROS ABS: 3 10*3/uL (ref 1.4–7.7)
Neutrophils Relative %: 68.3 % (ref 43.0–77.0)
PLATELETS: 271 10*3/uL (ref 150.0–400.0)
RBC: 3.89 Mil/uL (ref 3.87–5.11)
RDW: 13.1 % (ref 11.5–15.5)
WBC: 4.4 10*3/uL (ref 4.0–10.5)

## 2016-01-28 NOTE — Assessment & Plan Note (Signed)
New.  EKG w/o obvious cause.  Suspect there is a component of deconditioning.  Check labs to r/o thyroid abnormality, anemia, electrolyte disturbance.  Encouraged regular activity to build stamina.  If sxs don't improve, will refer to pulmonary or cardiology for additional evaluation.  Pt expressed understanding and is in agreement w/ plan.

## 2016-01-28 NOTE — Assessment & Plan Note (Signed)
New.  Suspect that she has a component of GERD.  Start nightly Ranitidine.  EKG WNL.  Will continue to follow.

## 2016-01-28 NOTE — Assessment & Plan Note (Signed)
Ongoing issue for pt.  Refer to ortho for complete evaluation and tx.

## 2016-01-29 ENCOUNTER — Telehealth: Payer: Self-pay | Admitting: General Practice

## 2016-01-29 ENCOUNTER — Other Ambulatory Visit: Payer: Self-pay

## 2016-01-29 ENCOUNTER — Encounter: Payer: Self-pay | Admitting: General Practice

## 2016-01-29 MED ORDER — LOSARTAN POTASSIUM-HCTZ 50-12.5 MG PO TABS: 1.0000 | ORAL_TABLET | Freq: Every day | ORAL | Status: DC

## 2016-01-29 NOTE — Telephone Encounter (Signed)
Medication filled to pharmacy as requested.   

## 2016-01-29 NOTE — Telephone Encounter (Signed)
So glad that the chest pain and shortness of breath are better.  The fatigue will likely improve w/ time as she recovers from whatever virus she likely had that was causing her symptoms

## 2016-01-29 NOTE — Telephone Encounter (Signed)
Called pt and gave her the lab results. Pt wanted to let you know that she is still feeling a little on the tired side. But is no longer having chest pains or SOB.

## 2016-02-13 ENCOUNTER — Emergency Department (HOSPITAL_COMMUNITY): Payer: BLUE CROSS/BLUE SHIELD

## 2016-02-13 ENCOUNTER — Encounter (HOSPITAL_COMMUNITY): Payer: Self-pay | Admitting: Emergency Medicine

## 2016-02-13 ENCOUNTER — Emergency Department (HOSPITAL_COMMUNITY)
Admission: EM | Admit: 2016-02-13 | Discharge: 2016-02-13 | Disposition: A | Discharge status: Home / Self Care | Payer: BLUE CROSS/BLUE SHIELD | Attending: Emergency Medicine | Admitting: Emergency Medicine

## 2016-02-13 DIAGNOSIS — Z86010 Personal history of colonic polyps: Secondary | ICD-10-CM | POA: Insufficient documentation

## 2016-02-13 DIAGNOSIS — G43909 Migraine, unspecified, not intractable, without status migrainosus: Secondary | ICD-10-CM | POA: Diagnosis not present

## 2016-02-13 DIAGNOSIS — Z87442 Personal history of urinary calculi: Secondary | ICD-10-CM | POA: Insufficient documentation

## 2016-02-13 DIAGNOSIS — F419 Anxiety disorder, unspecified: Secondary | ICD-10-CM | POA: Insufficient documentation

## 2016-02-13 DIAGNOSIS — R0789 Other chest pain: Secondary | ICD-10-CM | POA: Insufficient documentation

## 2016-02-13 DIAGNOSIS — M1990 Unspecified osteoarthritis, unspecified site: Secondary | ICD-10-CM | POA: Insufficient documentation

## 2016-02-13 DIAGNOSIS — Z8701 Personal history of pneumonia (recurrent): Secondary | ICD-10-CM | POA: Insufficient documentation

## 2016-02-13 DIAGNOSIS — R079 Chest pain, unspecified: Secondary | ICD-10-CM | POA: Diagnosis present

## 2016-02-13 DIAGNOSIS — Z8719 Personal history of other diseases of the digestive system: Secondary | ICD-10-CM | POA: Insufficient documentation

## 2016-02-13 DIAGNOSIS — I10 Essential (primary) hypertension: Secondary | ICD-10-CM | POA: Diagnosis not present

## 2016-02-13 DIAGNOSIS — F329 Major depressive disorder, single episode, unspecified: Secondary | ICD-10-CM | POA: Insufficient documentation

## 2016-02-13 DIAGNOSIS — Z79899 Other long term (current) drug therapy: Secondary | ICD-10-CM | POA: Insufficient documentation

## 2016-02-13 DIAGNOSIS — Z8669 Personal history of other diseases of the nervous system and sense organs: Secondary | ICD-10-CM | POA: Diagnosis not present

## 2016-02-13 DIAGNOSIS — Z791 Long term (current) use of non-steroidal anti-inflammatories (NSAID): Secondary | ICD-10-CM | POA: Diagnosis not present

## 2016-02-13 LAB — BASIC METABOLIC PANEL
ANION GAP: 8 (ref 5–15)
BUN: 17 mg/dL (ref 6–20)
CO2: 30 mmol/L (ref 22–32)
Calcium: 9.4 mg/dL (ref 8.9–10.3)
Chloride: 107 mmol/L (ref 101–111)
Creatinine, Ser: 0.82 mg/dL (ref 0.44–1.00)
GFR calc Af Amer: >60 mL/min (ref >60)
GLUCOSE: 103 mg/dL — AB (ref 65–99)
POTASSIUM: 3.9 mmol/L (ref 3.5–5.1)
Sodium: 145 mmol/L (ref 135–145)

## 2016-02-13 LAB — CBC
HEMATOCRIT: 39.4 % (ref 36.0–46.0)
HEMOGLOBIN: 12.2 g/dL (ref 12.0–15.0)
MCH: 30.3 pg (ref 26.0–34.0)
MCHC: 31 g/dL (ref 30.0–36.0)
MCV: 97.8 fL (ref 78.0–100.0)
Platelets: 232 10*3/uL (ref 150–400)
RBC: 4.03 MIL/uL (ref 3.87–5.11)
RDW: 12.9 % (ref 11.5–15.5)
WBC: 4.4 10*3/uL (ref 4.0–10.5)

## 2016-02-13 LAB — I-STAT TROPONIN, ED: Troponin i, poc: 0.01 ng/mL (ref 0.00–0.08)

## 2016-02-13 MED ORDER — MORPHINE SULFATE (PF) 4 MG/ML IV SOLN
4.0000 mg | Freq: Once | INTRAVENOUS | Status: DC
  Filled 2016-02-13: qty 1

## 2016-02-13 MED ORDER — OXYCODONE-ACETAMINOPHEN 5-325 MG PO TABS: 1.0000 | ORAL_TABLET | ORAL | Status: DC | PRN

## 2016-02-13 MED ORDER — OXYCODONE-ACETAMINOPHEN 5-325 MG PO TABS
2.0000 | ORAL_TABLET | Freq: Once | ORAL | Status: AC
  Administered 2016-02-13: 2 via ORAL
  Filled 2016-02-13: qty 2

## 2016-02-13 MED ORDER — FENTANYL CITRATE (PF) 100 MCG/2ML IJ SOLN
100.0000 ug | Freq: Once | INTRAMUSCULAR | Status: AC
Start: 2016-02-13 — End: 2016-02-13
  Administered 2016-02-13: 100 ug via INTRAVENOUS
  Filled 2016-02-13: qty 2

## 2016-02-13 NOTE — Discharge Instructions (Signed)
Tests showed no evidence of a heart attack. Prescription for pain. Follow-up your primary care doctor.

## 2016-02-13 NOTE — ED Notes (Signed)
Nurse at bedside collecting labs 

## 2016-02-13 NOTE — ED Provider Notes (Signed)
CSN: [id number]     Arrival date & time 02/13/16  [id number] History   First MD Initiated Contact with Patient 02/13/16 [phone]     Chief Complaint  Patient presents with  . Chest Pain     (Consider location/radiation/quality/duration/timing/severity/associated sxs/prior Treatment) HPI...Marland KitchenMarland KitchenPatient presents with left-sided chest pain since early this morning without associated dyspnea, diaphoresis, nausea. No history of coronary artery disease. Pain is worse with palpation, deep breath, movement. Nonsmoker. Severity is mild to moderate. Cardiac risk factors are low  Past Medical History  Diagnosis Date  . History of kidney stones   . Hypertension   . History of gastric ulcer   . History of adenomatous polyp of colon   . Anxiety   . Depression   . Carpal tunnel syndrome of right wrist     RECURRENT  . Wears glasses   . Pneumonia   . Headache     migraines  . Arthritis   . Environmental allergies     dust mites, pollen, roaches and other insects, mold   Past Surgical History  Procedure Laterality Date  . Tubal ligation  1982  . Laparoscopic assisted vaginal hysterectomy  04-25-2004    ovaries remain  . Carpal tunnel release Right 2007  . Cysto/  right retrograde pyelogram/ ureteroscopy stone extraction/  stent placement  06-07-2010  . Colonoscopy w/ polypectomy  04-15-2014  . Carpal tunnel release Right 07/05/2015    Procedure: RIGHT HAND REVISION CARPAL TUNNEL RELEASE;  Surgeon: Iran Planas, MD;  Location: [place];  Service: Orthopedics;  Laterality: Right;   Family History  Problem Relation Age of Onset  . Asthma Son   . Asthma Father   . Cancer Maternal Grandfather     colon  . Colon cancer Maternal Grandfather   . Arthritis Mother   . Hypertension Mother   . Diabetes Mother   . Cancer Maternal Aunt     breast  . Cancer Maternal Uncle     lung  . Colon cancer Maternal Uncle   . Cancer Maternal Grandmother     ovarian  . Diabetes Son    Social History  Substance Use  Topics  . Smoking status: Never Smoker   . Smokeless tobacco: Never Used  . Alcohol Use: Yes     Comment: 2 glasses of wine weekly   OB History    No data available     Review of Systems  All other systems reviewed and are negative.     Allergies  Aspirin and Bee venom  Home Medications   Prior to Admission medications   Medication Sig Start Date End Date Taking? Authorizing Provider  albuterol (PROVENTIL HFA;VENTOLIN HFA) 108 (90 Base) MCG/ACT inhaler Inhale 2 puffs into the lungs every 4 (four) hours as needed for shortness of breath. 12/19/15  Yes Midge Minium, MD  Ascorbic Acid (VITAMIN C PO) Take 1 tablet by mouth daily.   Yes Historical Provider, MD  Cholecalciferol (VITAMIN D PO) Take 1 tablet by mouth daily.   Yes Historical Provider, MD  clonazePAM (KLONOPIN) 0.5 MG tablet TAKE 1 TABLET BY MOUTH AT BEDTIME 01/25/16  Yes Midge Minium, MD  DULoxetine (CYMBALTA) 30 MG capsule Take 1 capsule (30 mg total) by mouth daily. 09/25/15  Yes Midge Minium, MD  EPINEPHrine (EPIPEN) 0.3 mg/0.3 mL SOAJ injection Inject 0.3 mg into the muscle once.   Yes Historical Provider, MD  escitalopram (LEXAPRO) 10 MG tablet TAKE 1 TABLET(10 MG) BY MOUTH DAILY 10/24/15  Yes  Midge Minium, MD  losartan-hydrochlorothiazide (HYZAAR) 50-12.5 MG tablet Take 1 tablet by mouth daily. 01/29/16  Yes Midge Minium, MD  meloxicam (MOBIC) 15 MG tablet Take 1 tablet (15 mg total) by mouth daily. 09/25/15  Yes Midge Minium, MD  methocarbamol (ROBAXIN) 500 MG tablet Take 500 mg by mouth 2 (two) times daily as needed for muscle spasms.   Yes Historical Provider, MD  pregabalin (LYRICA) 75 MG capsule Take 75 mg by mouth 2 (two) times daily.   Yes Historical Provider, MD  ranitidine (ZANTAC) 300 MG tablet Take 1 tablet (300 mg total) by mouth at bedtime. 01/26/16  Yes Midge Minium, MD  VITAMIN E PO Take 1 tablet by mouth daily.   Yes Historical Provider, MD  oxyCODONE-acetaminophen  (PERCOCET) 5-325 MG tablet Take 1-2 tablets by mouth every 4 (four) hours as needed. 02/13/16   Nat Christen, MD   BP 149/99 mmHg  Pulse 53  Temp(Src) 98.3 F (36.8 C) (Oral)  Resp 14  SpO2 99% Physical Exam  Constitutional: She is oriented to person, place, and time. She appears well-developed and well-nourished.  HENT:  Head: Normocephalic and atraumatic.  Eyes: Conjunctivae and EOM are normal. Pupils are equal, round, and reactive to light.  Neck: Normal range of motion. Neck supple.  Cardiovascular: Normal rate and regular rhythm.   Pulmonary/Chest: Effort normal and breath sounds normal.  Tender to palpation left chest wall.  Abdominal: Soft. Bowel sounds are normal.  Musculoskeletal: Normal range of motion.  Neurological: She is alert and oriented to person, place, and time.  Skin: Skin is warm and dry.  Psychiatric: She has a normal mood and affect. Her behavior is normal.  Nursing note and vitals reviewed.   ED Course  Procedures (including critical care time) Labs Review Labs Reviewed  BASIC METABOLIC PANEL - Abnormal; Notable for the following:    Glucose, Bld 103 (*)    All other components within normal limits  CBC  I-STAT TROPOININ, ED    Imaging Review Dg Chest 2 View  02/13/2016  CLINICAL DATA:  Began having LEFT side chest and upper back pain this morning at 0805 hours, shortness of breath, LEFT chest pressure, history hypertension EXAM: CHEST  2 VIEW COMPARISON:  01/26/2016 FINDINGS: Upper normal heart size. Mediastinal contours and pulmonary vascularity normal. Bibasilar atelectasis and minimal chronic central peribronchial thickening. No acute infiltrate, pleural effusion or pneumothorax. Prior cervical spine fusion. No acute osseous findings. Diffuse osseous demineralization. IMPRESSION: Bibasilar atelectasis. Electronically Signed   By: Lavonia Dana M.D.   On: 02/13/2016 09:53   I have personally reviewed and evaluated these images and lab results as part of  my medical decision-making.   EKG Interpretation   Date/Time:  Tuesday February 13 2016 09:08:17 EST Ventricular Rate:  76 PR Interval:  153 QRS Duration: 98 QT Interval:  410 QTC Calculation: 461 R Axis:   2 Text Interpretation:  Sinus rhythm Baseline wander in lead(s) V2 Confirmed  by Markus Casten  MD, Mera Gunkel ([id number]) on 02/13/2016 1:34:55 PM      MDM   Final diagnoses:  Chest pain, unspecified chest pain type    Patient is low risk for acute coronary syndrome or pulmonary embolism.  Screening EKG, chest x-ray, troponin all negative. Patient has primary care follow-up. Discharge medication Percocet    Nat Christen, MD 02/14/16 1520

## 2016-02-13 NOTE — ED Notes (Signed)
Left chest pain radiating into left back. Pt states feels similar to muscle spasms but previous muscle spasms haven't lasted this long. Pt hyperventilating in triage she states is from the pain. Denies nausea, vomiting, fever, chills, cough.

## 2016-02-13 NOTE — ED Notes (Signed)
Discharge information reviewed with pt. She is currently waiting on her family to get here to drive her home

## 2016-02-15 ENCOUNTER — Other Ambulatory Visit: Payer: Self-pay | Admitting: General Practice

## 2016-02-15 ENCOUNTER — Encounter: Payer: Self-pay | Admitting: Family Medicine

## 2016-02-15 ENCOUNTER — Other Ambulatory Visit: Payer: Self-pay | Admitting: Family Medicine

## 2016-02-15 ENCOUNTER — Ambulatory Visit (INDEPENDENT_AMBULATORY_CARE_PROVIDER_SITE_OTHER): Payer: BLUE CROSS/BLUE SHIELD | Admitting: Family Medicine

## 2016-02-15 ENCOUNTER — Encounter (HOSPITAL_BASED_OUTPATIENT_CLINIC_OR_DEPARTMENT_OTHER): Payer: Self-pay

## 2016-02-15 ENCOUNTER — Ambulatory Visit (HOSPITAL_BASED_OUTPATIENT_CLINIC_OR_DEPARTMENT_OTHER)
Admission: RE | Admit: 2016-02-15 | Discharge: 2016-02-15 | Disposition: A | Discharge status: Home / Self Care | Payer: BLUE CROSS/BLUE SHIELD | Source: Ambulatory Visit | Attending: Family Medicine | Admitting: Family Medicine

## 2016-02-15 VITALS — BP 118/82 | HR 85 | Temp 98.3°F | Resp 16 | Ht 67.0 in | Wt 222.0 lb

## 2016-02-15 DIAGNOSIS — R079 Chest pain, unspecified: Secondary | ICD-10-CM | POA: Insufficient documentation

## 2016-02-15 DIAGNOSIS — R0781 Pleurodynia: Secondary | ICD-10-CM

## 2016-02-15 DIAGNOSIS — I517 Cardiomegaly: Secondary | ICD-10-CM | POA: Insufficient documentation

## 2016-02-15 MED ORDER — IOHEXOL 350 MG/ML SOLN
100.0000 mL | Freq: Once | INTRAVENOUS | Status: AC | PRN
  Administered 2016-02-15: 100 mL via INTRAVENOUS

## 2016-02-15 MED ORDER — PREDNISONE 10 MG PO TABS: ORAL_TABLET | ORAL | Status: DC

## 2016-02-15 NOTE — Assessment & Plan Note (Signed)
New.  Pt's CP is atypical but pleuritic and radiating through to her back which is concerning for PE or dissection.  Pt's CXR was WNL 2 days ago.  Trop and EKG (-).  Since pain is not reproducible today, will proceed w/ additional imaging to r/o PE and dissection.  If CTA is negative, will tx for musculoskeletal cause w/ prednisone since Percocet was not effective.  Pt expressed understanding and is in agreement w/ plan.

## 2016-02-15 NOTE — Patient Instructions (Signed)
We are going to do your CT scan to rule out a blood clot or any other issues in the chest Once we have the results, we will determine the next steps Hang in there!!!

## 2016-02-15 NOTE — Progress Notes (Signed)
Pre visit review using our clinic review tool, if applicable. No additional management support is needed unless otherwise documented below in the visit note. 

## 2016-02-15 NOTE — Progress Notes (Signed)
   Subjective:    Patient ID: Linda Buckley, female    DOB: Jan 28, 1956, 60 y.o.   MRN: [id number]  HPI CP- 'i'm just getting worse'.  Went to ER 2/28 for L sided CP.  Xray and EKG normal.  Pain is pleuritic, radiates to back.  Pain is constant, worse w/ exertion.  No recent travel.  No known injury.  Denies GERD, changes to bowel/bladder habits.  Breathing is not labored at rest but was having difficulty w/ ambulation.  Pt is tearful during visit due to pain.   Review of Systems For ROS see HPI     Objective:   Physical Exam  Constitutional: She is oriented to person, place, and time. She appears well-developed and well-nourished. She appears distressed (tearful, anxious).  HENT:  Head: Normocephalic and atraumatic.  Eyes: Conjunctivae and EOM are normal. Pupils are equal, round, and reactive to light.  Cardiovascular: Normal rate, regular rhythm, normal heart sounds and intact distal pulses.  Exam reveals no friction rub.   No murmur heard. Pulmonary/Chest: Effort normal and breath sounds normal. No respiratory distress. She has no wheezes. She has no rales. She exhibits tenderness (mild TTP over L anterior chest wall superior to L breast).  Musculoskeletal: She exhibits tenderness (diffuse TTP over multiple trigger points). She exhibits no edema.  Neurological: She is alert and oriented to person, place, and time.  Skin: Skin is warm and dry.  Vitals reviewed.         Assessment & Plan:

## 2016-03-02 ENCOUNTER — Other Ambulatory Visit: Payer: Self-pay | Admitting: Family Medicine

## 2016-03-02 NOTE — Telephone Encounter (Signed)
Medication filled to pharmacy as requested.   

## 2016-03-27 ENCOUNTER — Other Ambulatory Visit: Payer: Self-pay | Admitting: Family Medicine

## 2016-03-28 NOTE — Telephone Encounter (Signed)
Linda Buckley-- please advise in PCP's absence?  Name from pharmacy:  In chart as:  CLONAZEPAM 0.5MG  TABLETS clonazePAM (KLONOPIN) 0.5 MG tablet     Sig: TAKE 1 TABLET BY MOUTH AT BEDTIME    Dispense: 30 tablet   Refills: 0   Start: 03/27/2016   Class: Normal    Requested on: 03/27/2016    Originally ordered on: 04/05/2015 01/25/2016      No previous CSC or UDS on file. Has f/u with Dr Birdie Riddle on 04/22/16.

## 2016-03-28 NOTE — Telephone Encounter (Signed)
Rx limited klonopin. Covering for Dr. Birdie Riddle.

## 2016-03-28 NOTE — Telephone Encounter (Signed)
Left message for pt that Rx was sent to pharmacy.

## 2016-04-03 ENCOUNTER — Ambulatory Visit (INDEPENDENT_AMBULATORY_CARE_PROVIDER_SITE_OTHER): Payer: BLUE CROSS/BLUE SHIELD | Admitting: Neurology

## 2016-04-03 ENCOUNTER — Encounter: Payer: Self-pay | Admitting: Neurology

## 2016-04-03 VITALS — BP 103/83 | HR 64 | Ht 67.0 in | Wt 218.0 lb

## 2016-04-03 DIAGNOSIS — M791 Myalgia, unspecified site: Secondary | ICD-10-CM

## 2016-04-03 DIAGNOSIS — G609 Hereditary and idiopathic neuropathy, unspecified: Secondary | ICD-10-CM | POA: Diagnosis not present

## 2016-04-03 HISTORY — DX: Hereditary and idiopathic neuropathy, unspecified: G60.9

## 2016-04-03 MED ORDER — PREGABALIN 75 MG PO CAPS: 75.0000 mg | ORAL_CAPSULE | Freq: Two times a day (BID) | ORAL | Status: DC

## 2016-04-03 NOTE — Progress Notes (Signed)
Reason for visit: Leg pain  Referring physician: Dr. Anda Kraft is a 60 y.o. female  History of present illness:  Linda Buckley is a 60 year old right-handed black female with a history of cervical spine disease, requiring surgical decompression in December 2016. Following surgery, the patient has had increasing problems with leg discomfort that actually began approximately one year ago. This has gradually worsened over time. The patient has discomfort in the anterior and lateral aspects of the thighs, pain going down into the lateral aspect of the lower leg to the ankle. The patient denies any severe shoulder discomfort, but she does have some right arm pain, she does have bilateral carpal tunnel syndrome. The patient has developed some tingling sensations in the toes of the feet bilaterally, she has a prominent family history of diabetes but no personal history of diabetes. She has undergone EMG and nerve conduction study evaluation recently that shows evidence of a mild peripheral neuropathy. The patient also indicates that there is a prominent family history of lupus. The patient denies any definite weakness, but she does have exercise intolerance, she will have increased pain if she does too much activity. She also has noted some difficulty with sleeping, she has difficulty with concentration as well that has developed. She denies any issues controlling the bowels or the bladder, she has not had any severe balance changes. She does report some occasional headache problems over the last 3 or 4 months. She has been on Lyrica and Cymbalta with some improvement, she recently ran out of the Lyrica. She comes to this office for an evaluation. MRI of the low back showed a disc bulge at the L4-5 level, no evidence of nerve root impingement. No significant spinal stenosis was seen.  Past Medical History  Diagnosis Date  . History of kidney stones   . Hypertension   . History of gastric  ulcer   . History of adenomatous polyp of colon   . Anxiety   . Depression   . Carpal tunnel syndrome of right wrist     RECURRENT  . Wears glasses   . Pneumonia   . Headache     migraines  . Arthritis   . Environmental allergies     dust mites, pollen, roaches and other insects, mold  . Hereditary and idiopathic peripheral neuropathy 04/03/2016    Past Surgical History  Procedure Laterality Date  . Tubal ligation  1982  . Laparoscopic assisted vaginal hysterectomy  04-25-2004    ovaries remain  . Carpal tunnel release Right 2007  . Cysto/  right retrograde pyelogram/ ureteroscopy stone extraction/  stent placement  06-07-2010  . Colonoscopy w/ polypectomy  04-15-2014  . Carpal tunnel release Right 07/05/2015    Procedure: RIGHT HAND REVISION CARPAL TUNNEL RELEASE;  Surgeon: Iran Planas, MD;  Location: [facility];  Service: Orthopedics;  Laterality: Right;  . Cervical disc arthroplasty  03/2015    Family History  Problem Relation Age of Onset  . Asthma Son   . Asthma Father   . Cancer Maternal Grandfather     colon  . Colon cancer Maternal Grandfather   . Arthritis Mother   . Hypertension Mother   . Diabetes Mother   . Cancer Maternal Aunt     breast  . Cancer Maternal Uncle     lung  . Colon cancer Maternal Uncle   . Cancer Maternal Grandmother     ovarian  . Diabetes Son   . Diabetes  Brother   . Diabetes Brother     Social history:  reports that she has never smoked. She has never used smokeless tobacco. She reports that she drinks alcohol. She reports that she does not use illicit drugs.  Medications:  Prior to Admission medications   Medication Sig Start Date End Date Taking? Authorizing Provider  albuterol (PROVENTIL HFA;VENTOLIN HFA) 108 (90 Base) MCG/ACT inhaler Inhale 2 puffs into the lungs every 4 (four) hours as needed for shortness of breath. 12/19/15  Yes Midge Minium, MD  Ascorbic Acid (VITAMIN C PO) Take 1 tablet by mouth daily.   Yes Historical  Provider, MD  Cholecalciferol (VITAMIN D PO) Take 1 tablet by mouth daily.   Yes Historical Provider, MD  clonazePAM (KLONOPIN) 0.5 MG tablet TAKE 1 TABLET BY MOUTH AT BEDTIME 03/28/16  Yes Edward Saguier, PA-C  DULoxetine (CYMBALTA) 30 MG capsule Take 1 capsule (30 mg total) by mouth daily. 09/25/15  Yes Midge Minium, MD  EPINEPHrine (EPIPEN) 0.3 mg/0.3 mL SOAJ injection Inject 0.3 mg into the muscle once.   Yes Historical Provider, MD  escitalopram (LEXAPRO) 10 MG tablet TAKE 1 TABLET(10 MG) BY MOUTH DAILY 03/02/16  Yes Midge Minium, MD  losartan-hydrochlorothiazide (HYZAAR) 50-12.5 MG tablet Take 1 tablet by mouth daily. 01/29/16  Yes Midge Minium, MD  meloxicam (MOBIC) 15 MG tablet Take 1 tablet (15 mg total) by mouth daily. 09/25/15  Yes Midge Minium, MD  oxyCODONE-acetaminophen (PERCOCET) 5-325 MG tablet Take 1-2 tablets by mouth every 4 (four) hours as needed. 02/13/16  Yes Nat Christen, MD  ranitidine (ZANTAC) 300 MG tablet Take 1 tablet (300 mg total) by mouth at bedtime. 01/26/16  Yes Midge Minium, MD  VITAMIN E PO Take 1 tablet by mouth daily.   Yes Historical Provider, MD  pregabalin (LYRICA) 75 MG capsule Take 1 capsule (75 mg total) by mouth 2 (two) times daily. Reported on 04/03/2016 04/03/16   Kathrynn Ducking, MD      Allergies  Allergen Reactions  . Aspirin Swelling    Sweating/ swelling of hands and face   . Bee Venom Swelling    Swelling at site     ROS:  Out of a complete 14 system review of symptoms, the patient complains only of the following symptoms, and all other reviewed systems are negative.  Swelling in the legs Shortness of breath Joint pain, achy muscles Allergies, runny nose Memory loss, headache, weakness Depression, anxiety, not enough sleep, decreased energy, disinterest in activities Restless legs  Blood pressure 103/83, pulse 64, height 5\' 7"  (1.702 m), weight 218 lb (98.884 kg).  Physical Exam  General: The patient is  alert and cooperative at the time of the examination.  Eyes: Pupils are equal, round, and reactive to light. Discs are flat bilaterally.  Neck: The neck is supple, no carotid bruits are noted.  Respiratory: The respiratory examination is clear.  Cardiovascular: The cardiovascular examination reveals a regular rate and rhythm, no obvious murmurs or rubs are noted.  Neuromuscular: Range of movement of the low back is full.  Skin: Extremities are without significant edema.  Neurologic Exam  Mental status: The patient is alert and oriented x 3 at the time of the examination. The patient has apparent normal recent and remote memory, with an apparently normal attention span and concentration ability.  Cranial nerves: Facial symmetry is present. There is good sensation of the face to pinprick and soft touch bilaterally. The strength of the facial  muscles and the muscles to head turning and shoulder shrug are normal bilaterally. Speech is well enunciated, no aphasia or dysarthria is noted. Extraocular movements are full. Visual fields are full. The tongue is midline, and the patient has symmetric elevation of the soft palate. No obvious hearing deficits are noted.  Motor: The motor testing reveals 5 over 5 strength of all 4 extremities. Good symmetric motor tone is noted throughout.  Sensory: Sensory testing is intact to pinprick, soft touch, vibration sensation, and position sense on all 4 extremities, with exception of a stocking pattern pinprick sensory deficit across the ankles bilaterally, some mild impairment of position sense in both feet. No evidence of extinction is noted.  Coordination: Cerebellar testing reveals good finger-nose-finger and heel-to-shin bilaterally.  Gait and station: Gait is normal. Tandem gait is normal. Romberg is negative. No drift is seen.  Reflexes: Deep tendon reflexes are symmetric and normal bilaterally. Toes are downgoing  bilaterally.   Assessment/Plan:  1. Mild peripheral neuropathy  2. Low back pain, leg pain  The patient appears to have evidence of a mild peripheral neuropathy by nerve conduction studies, she will undergo further blood work evaluation to investigate the etiology of this. The patient also has neuromuscular discomfort in both legs and the right arm. The patient may be developing a fibromyalgia syndrome. MRI of the low back does not show an etiology for the leg discomfort. The patient will be placed back on Lyrica taking 75 mg twice daily, she will follow-up in 3 or 4 months.  Jill Alexanders MD 04/03/2016 8:02 PM  [facility] [address] [place], [place] [address]  Phone [phone] Fax [phone]

## 2016-04-05 ENCOUNTER — Other Ambulatory Visit: Payer: Self-pay | Admitting: Family Medicine

## 2016-04-05 LAB — MULTIPLE MYELOMA PANEL, SERUM
ALBUMIN SERPL ELPH-MCNC: 3.4 g/dL (ref 2.9–4.4)
ALPHA 1: 0.3 g/dL (ref 0.0–0.4)
ALPHA2 GLOB SERPL ELPH-MCNC: 0.8 g/dL (ref 0.4–1.0)
Albumin/Glob SerPl: 1.1 (ref 0.7–1.7)
B-GLOBULIN SERPL ELPH-MCNC: 1.1 g/dL (ref 0.7–1.3)
Gamma Glob SerPl Elph-Mcnc: 1.1 g/dL (ref 0.4–1.8)
Globulin, Total: 3.2 g/dL (ref 2.2–3.9)
IGG (IMMUNOGLOBIN G), SERUM: 957 mg/dL (ref 700–1600)
IgA/Immunoglobulin A, Serum: 237 mg/dL (ref 87–352)
IgM (Immunoglobulin M), Srm: 136 mg/dL (ref 26–217)
TOTAL PROTEIN: 6.6 g/dL (ref 6.0–8.5)

## 2016-04-05 LAB — B. BURGDORFI ANTIBODIES: Lyme IgG/IgM Ab: <0.91 {ISR} (ref 0.00–0.90)

## 2016-04-05 LAB — SEDIMENTATION RATE: Sed Rate: 21 mm/hr (ref 0–40)

## 2016-04-05 LAB — CK: Total CK: 97 U/L (ref 24–173)

## 2016-04-05 LAB — ANGIOTENSIN CONVERTING ENZYME: ANGIO CONVERT ENZYME: 26 U/L (ref 14–82)

## 2016-04-05 LAB — VITAMIN B12: Vitamin B-12: 293 pg/mL (ref 211–946)

## 2016-04-05 LAB — HIV ANTIBODY (ROUTINE TESTING W REFLEX): HIV Screen 4th Generation wRfx: NONREACTIVE

## 2016-04-05 LAB — RHEUMATOID FACTOR

## 2016-04-05 LAB — ANA W/REFLEX: ANA: NEGATIVE

## 2016-04-05 NOTE — Telephone Encounter (Signed)
Medication filled to pharmacy as requested.   

## 2016-04-15 DIAGNOSIS — M797 Fibromyalgia: Secondary | ICD-10-CM

## 2016-04-15 HISTORY — DX: Fibromyalgia: M79.7

## 2016-04-18 ENCOUNTER — Other Ambulatory Visit: Payer: Self-pay | Admitting: Family Medicine

## 2016-04-19 NOTE — Telephone Encounter (Signed)
Last OV 03/04/16 Clonazepam last filled 03/28/16 #15 with 0

## 2016-04-19 NOTE — Telephone Encounter (Signed)
Medication filled to pharmacy as requested.   

## 2016-04-22 ENCOUNTER — Encounter: Payer: Self-pay | Admitting: Family Medicine

## 2016-04-22 ENCOUNTER — Ambulatory Visit (INDEPENDENT_AMBULATORY_CARE_PROVIDER_SITE_OTHER): Payer: BLUE CROSS/BLUE SHIELD | Admitting: Family Medicine

## 2016-04-22 VITALS — BP 126/86 | HR 80 | Temp 98.1°F | Resp 16 | Ht 67.0 in | Wt 223.0 lb

## 2016-04-22 DIAGNOSIS — I10 Essential (primary) hypertension: Secondary | ICD-10-CM

## 2016-04-22 DIAGNOSIS — M797 Fibromyalgia: Secondary | ICD-10-CM

## 2016-04-22 DIAGNOSIS — F4323 Adjustment disorder with mixed anxiety and depressed mood: Secondary | ICD-10-CM | POA: Diagnosis not present

## 2016-04-22 LAB — CBC WITH DIFFERENTIAL/PLATELET
Basophils Absolute: 0 cells/uL (ref 0–200)
Basophils Relative: 0 %
EOS PCT: 2 %
Eosinophils Absolute: 70 cells/uL (ref 15–500)
HCT: 38 % (ref 35.0–45.0)
Hemoglobin: 12.3 g/dL (ref 11.7–15.5)
LYMPHS PCT: 33 %
Lymphs Abs: 1155 cells/uL (ref 850–3900)
MCH: 29.5 pg (ref 27.0–33.0)
MCHC: 32.4 g/dL (ref 32.0–36.0)
MCV: 91.1 fL (ref 80.0–100.0)
MONOS PCT: 7 %
MPV: 10.8 fL (ref 7.5–12.5)
Monocytes Absolute: 245 cells/uL (ref 200–950)
NEUTROS PCT: 58 %
Neutro Abs: 2030 cells/uL (ref 1500–7800)
PLATELETS: 236 10*3/uL (ref 140–400)
RBC: 4.17 MIL/uL (ref 3.80–5.10)
RDW: 12.9 % (ref 11.0–15.0)
WBC: 3.5 10*3/uL — ABNORMAL LOW (ref 3.8–10.8)

## 2016-04-22 MED ORDER — DULOXETINE HCL 60 MG PO CPEP: 60.0000 mg | ORAL_CAPSULE | Freq: Every day | ORAL | Status: DC

## 2016-04-22 MED ORDER — ALPRAZOLAM 0.5 MG PO TABS: 0.5000 mg | ORAL_TABLET | Freq: Two times a day (BID) | ORAL | Status: DC | PRN

## 2016-04-22 NOTE — Patient Instructions (Signed)
Follow up in 6 weeks to recheck pain and mood We'll notify you of your lab results and make any changes if needed Continue to work on healthy diet and regular exercise- this will help your fibro pain Increase the Cymbalta to 60mg  daily- 2 of what you have at home, 1 of the new prescription STOP the Lexapro (Escitalopram) Use the Alprazolam as needed for panicked moments Call with any questions or concerns Hang in there!!

## 2016-04-22 NOTE — Progress Notes (Signed)
Pre visit review using our clinic review tool, if applicable. No additional management support is needed unless otherwise documented below in the visit note. 

## 2016-04-22 NOTE — Progress Notes (Signed)
   Subjective:    Patient ID: Linda Buckley, female    DOB: 1956-07-20, 60 y.o.   MRN: [id number]  HPI HTN- chronic problem, on Losartan HCTZ.  Denies CP.  Some SOB w/ exertion.  + HA x2 weeks- frontal, consistent w/ allergies.  + blurry vision.    Fibromyalgia- ongoing issue for pt.  On Cymbalta and Lexapro.  On Lyrica per Rheumatology.  Pt feels that Lyrica is helping but would like to increase the Cymbalta as recommended by Rheum.  Pt is having a hard time w/ anxiety since the dx of fibro and is asking to restart Alprazolam prn.     Review of Systems For ROS see HPI     Objective:   Physical Exam  Constitutional: She is oriented to person, place, and time. She appears well-developed and well-nourished. No distress.  HENT:  Head: Normocephalic and atraumatic.  Eyes: Conjunctivae and EOM are normal. Pupils are equal, round, and reactive to light.  Neck: Normal range of motion. Neck supple. No thyromegaly present.  Cardiovascular: Normal rate, regular rhythm, normal heart sounds and intact distal pulses.   No murmur heard. Pulmonary/Chest: Effort normal and breath sounds normal. No respiratory distress.  Abdominal: Soft. She exhibits no distension. There is no tenderness.  Musculoskeletal: She exhibits no edema.  Lymphadenopathy:    She has no cervical adenopathy.  Neurological: She is alert and oriented to person, place, and time.  Skin: Skin is warm and dry.  Psychiatric: She has a normal mood and affect. Her behavior is normal.  Vitals reviewed.         Assessment & Plan:

## 2016-04-23 ENCOUNTER — Encounter: Payer: Self-pay | Admitting: General Practice

## 2016-04-23 LAB — BASIC METABOLIC PANEL
BUN: 15 mg/dL (ref 7–25)
CALCIUM: 9.4 mg/dL (ref 8.6–10.4)
CO2: 30 mmol/L (ref 20–31)
CREATININE: 0.78 mg/dL (ref 0.50–1.05)
Chloride: 103 mmol/L (ref 98–110)
Glucose, Bld: 86 mg/dL (ref 65–99)
Potassium: 3.7 mmol/L (ref 3.5–5.3)
Sodium: 141 mmol/L (ref 135–146)

## 2016-05-01 NOTE — Assessment & Plan Note (Signed)
Ongoing issue.  Pt is having a hard time w/ her Fibromyalgia dx.  Will restart Alprazolam prn for high stress situations.  Will follow.

## 2016-05-01 NOTE — Assessment & Plan Note (Signed)
Chronic problem.  Will increase Cymbalta as Rheum recommended.  Stop Lexapro.  On Lyrica per Rheum.  Will follow.

## 2016-05-01 NOTE — Assessment & Plan Note (Signed)
Chronic problem.  Adequately controlled on current medication.  Check labs.  No anticipated med changes.  Will follow.

## 2016-05-14 ENCOUNTER — Other Ambulatory Visit: Payer: Self-pay

## 2016-05-14 DIAGNOSIS — Z1231 Encounter for screening mammogram for malignant neoplasm of breast: Secondary | ICD-10-CM

## 2016-05-20 ENCOUNTER — Other Ambulatory Visit: Payer: Self-pay | Admitting: Family Medicine

## 2016-05-21 NOTE — Telephone Encounter (Signed)
Medication filled to pharmacy as requested.   

## 2016-05-21 NOTE — Telephone Encounter (Signed)
Last OV 04/22/16 Clonazepam last filled 04/19/16 #30 with 0

## 2016-05-24 ENCOUNTER — Other Ambulatory Visit: Payer: Self-pay | Admitting: Family Medicine

## 2016-05-24 ENCOUNTER — Ambulatory Visit
Admission: RE | Admit: 2016-05-24 | Discharge: 2016-05-24 | Disposition: A | Discharge status: Home / Self Care | Payer: BLUE CROSS/BLUE SHIELD | Source: Ambulatory Visit

## 2016-05-24 DIAGNOSIS — Z1231 Encounter for screening mammogram for malignant neoplasm of breast: Secondary | ICD-10-CM

## 2016-05-24 NOTE — Telephone Encounter (Signed)
Medication filled to pharmacy as requested.   

## 2016-06-03 ENCOUNTER — Ambulatory Visit (INDEPENDENT_AMBULATORY_CARE_PROVIDER_SITE_OTHER): Payer: BLUE CROSS/BLUE SHIELD | Admitting: Family Medicine

## 2016-06-03 ENCOUNTER — Encounter: Payer: Self-pay | Admitting: Family Medicine

## 2016-06-03 VITALS — BP 126/84 | HR 66 | Temp 97.9°F | Resp 16 | Ht 67.0 in | Wt 226.5 lb

## 2016-06-03 DIAGNOSIS — F4323 Adjustment disorder with mixed anxiety and depressed mood: Secondary | ICD-10-CM | POA: Diagnosis not present

## 2016-06-03 MED ORDER — BUPROPION HCL ER (XL) 150 MG PO TB24: 150.0000 mg | ORAL_TABLET | Freq: Every day | ORAL | Status: DC

## 2016-06-03 NOTE — Progress Notes (Signed)
Pre visit review using our clinic review tool, if applicable. No additional management support is needed unless otherwise documented below in the visit note. 

## 2016-06-03 NOTE — Progress Notes (Signed)
   Subjective:    Patient ID: Linda Buckley, female    DOB: 04-28-1956, 60 y.o.   MRN: [id number]  HPI Anxiety/Depression- chronic problem, Cymbalta was increased to 60mg  at last visit.  Pt reports she continues to have 'the worst mood swings'.  Feels she would benefit from talking to someone.  Finds herself frequently tearful.  Having difficulty managing pain.  Taking Clonazepam nightly for sleep.     Review of Systems For ROS see HPI     Objective:   Physical Exam  Constitutional: She is oriented to person, place, and time. She appears well-developed and well-nourished. No distress.  HENT:  Head: Normocephalic and atraumatic.  Neurological: She is alert and oriented to person, place, and time.  Skin: Skin is warm and dry.  Psychiatric: Her behavior is normal. Thought content normal.  Flat affect  Vitals reviewed.         Assessment & Plan:

## 2016-06-03 NOTE — Patient Instructions (Signed)
Follow up in 4-6 weeks to recheck mood Continue the Cymbalta 60mg  daily Add the Wellbutrin 150mg  daily in the AM Call and schedule an appt w/ Terri or Almyra Free at the [facility] office [phone] Call with any questions or concerns Hang in there!!!

## 2016-06-04 NOTE — Assessment & Plan Note (Signed)
Ongoing.  No improvement w/ increased dose of Cymbalta.  Pt is still struggling w/ depression due to her now chronic pain.  Add Wellbutrin.  Encouraged her to start counseling to work through the emotional aspect of chronic pain.  Pt expressed understanding and is in agreement w/ plan.

## 2016-06-20 ENCOUNTER — Ambulatory Visit (INDEPENDENT_AMBULATORY_CARE_PROVIDER_SITE_OTHER): Payer: BLUE CROSS/BLUE SHIELD | Admitting: Licensed Clinical Social Worker

## 2016-06-20 DIAGNOSIS — F321 Major depressive disorder, single episode, moderate: Secondary | ICD-10-CM | POA: Diagnosis not present

## 2016-06-26 ENCOUNTER — Other Ambulatory Visit: Payer: Self-pay | Admitting: Family Medicine

## 2016-06-26 NOTE — Telephone Encounter (Signed)
Medication filled to pharmacy as requested.   

## 2016-07-01 ENCOUNTER — Other Ambulatory Visit: Payer: Self-pay | Admitting: General Practice

## 2016-07-01 MED ORDER — CLONAZEPAM 0.5 MG PO TABS: 0.5000 mg | ORAL_TABLET | Freq: Every day | ORAL | Status: DC

## 2016-07-01 NOTE — Telephone Encounter (Signed)
Medication filled to pharmacy as requested.   

## 2016-07-01 NOTE — Telephone Encounter (Signed)
Last OV 06/03/16 Clonazepam last filled 05/21/16 #30 with 0

## 2016-07-04 ENCOUNTER — Ambulatory Visit: Payer: BLUE CROSS/BLUE SHIELD | Admitting: Licensed Clinical Social Worker

## 2016-07-08 ENCOUNTER — Encounter: Payer: Self-pay | Admitting: Family Medicine

## 2016-07-08 ENCOUNTER — Ambulatory Visit (INDEPENDENT_AMBULATORY_CARE_PROVIDER_SITE_OTHER): Payer: BLUE CROSS/BLUE SHIELD | Admitting: Family Medicine

## 2016-07-08 VITALS — BP 124/84 | HR 86 | Temp 99.1°F | Resp 16 | Ht 67.0 in | Wt 232.4 lb

## 2016-07-08 DIAGNOSIS — F329 Major depressive disorder, single episode, unspecified: Secondary | ICD-10-CM | POA: Diagnosis not present

## 2016-07-08 DIAGNOSIS — F32A Depression, unspecified: Secondary | ICD-10-CM

## 2016-07-08 NOTE — Progress Notes (Signed)
Pre visit review using our clinic review tool, if applicable. No additional management support is needed unless otherwise documented below in the visit note. 

## 2016-07-08 NOTE — Progress Notes (Signed)
   Subjective:    Patient ID: Linda Buckley, female    DOB: 06-Dec-1956, 60 y.o.   MRN: [id number]  HPI Anxiety/depression- pt added Wellbutrin to the 60mg  Cymbalta last visit.  Pt reports only 2 episodes of crying since last visit.  Started seeing Almyra Free for counseling.  Feelings less overwhelmed.  Pt states she is coming to terms w/ her fibro dx and doing better at accepting the good and the bad days.   Review of Systems For ROS see HPI     Objective:   Physical Exam  Constitutional: She is oriented to person, place, and time. She appears well-developed and well-nourished. No distress.  HENT:  Head: Normocephalic and atraumatic.  Musculoskeletal:  Bilateral wrist braces in place L ankle brace in place  Neurological: She is alert and oriented to person, place, and time.  Skin: Skin is warm and dry.  Psychiatric: She has a normal mood and affect. Her behavior is normal. Thought content normal.  Vitals reviewed.         Assessment & Plan:

## 2016-07-08 NOTE — Assessment & Plan Note (Signed)
Pt is doing better since starting the Wellbutrin and starting counseling w/ Melinda Crutch.  No med changes at this time.  Will continue to follow.

## 2016-07-08 NOTE — Patient Instructions (Signed)
Schedule your complete physical in November No med changes at this time- you're doing great!!! I'm so glad that you're starting to feel better emotionally- you deserve it!! Call with any questions or concerns Have a great summer!!

## 2016-07-10 ENCOUNTER — Other Ambulatory Visit: Payer: Self-pay | Admitting: General Practice

## 2016-07-10 MED ORDER — ALPRAZOLAM 0.5 MG PO TABS: 0.5000 mg | ORAL_TABLET | Freq: Two times a day (BID) | ORAL | 1 refills | Status: DC | PRN

## 2016-07-10 NOTE — Telephone Encounter (Signed)
Last OV 07/08/16 Alprazolam last filled 04/22/16 #60 with 1

## 2016-07-10 NOTE — Telephone Encounter (Signed)
Medication filled to pharmacy as requested.   

## 2016-07-18 ENCOUNTER — Telehealth: Payer: Self-pay | Admitting: *Deleted

## 2016-07-18 NOTE — Telephone Encounter (Signed)
Alternate tylenol w/ ibuprofen for headache.  Also, pollen counts are very high as ragweed has bloomed and her HA may be sinus related.  Add daily Claritin or Zyrtec if not already taking

## 2016-07-18 NOTE — Telephone Encounter (Signed)
Patient notified of PCP recommendations and is agreement and expresses an understanding.  

## 2016-07-18 NOTE — Telephone Encounter (Signed)
Patient called stating she has had a headche for the last 3 days, patient said she was seen in the office last week. Patient stated she has took advil with no relief. Patient uses [facility] on [address], [place]. Please advise

## 2016-07-22 ENCOUNTER — Ambulatory Visit (INDEPENDENT_AMBULATORY_CARE_PROVIDER_SITE_OTHER): Payer: BLUE CROSS/BLUE SHIELD | Admitting: Family Medicine

## 2016-07-22 ENCOUNTER — Encounter: Payer: Self-pay | Admitting: Family Medicine

## 2016-07-22 VITALS — BP 124/84 | HR 73 | Temp 98.6°F | Resp 16 | Ht 67.0 in | Wt 228.2 lb

## 2016-07-22 DIAGNOSIS — H109 Unspecified conjunctivitis: Secondary | ICD-10-CM

## 2016-07-22 DIAGNOSIS — J0100 Acute maxillary sinusitis, unspecified: Secondary | ICD-10-CM | POA: Diagnosis not present

## 2016-07-22 MED ORDER — AMOXICILLIN 875 MG PO TABS: 875.0000 mg | ORAL_TABLET | Freq: Two times a day (BID) | ORAL | 0 refills | Status: DC

## 2016-07-22 MED ORDER — POLYMYXIN B-TRIMETHOPRIM 10000-0.1 UNIT/ML-% OP SOLN: OPHTHALMIC | 0 refills | Status: DC

## 2016-07-22 NOTE — Patient Instructions (Signed)
Follow up as needed Start the Amoxicillin twice daily- take w/ food Drink plenty of fluids REST! Use the eye drops- 2 drops in L eye for up to 5 days (you can stop sooner if symptoms improve!) Call with any questions or concerns Hang in there!!!

## 2016-07-22 NOTE — Progress Notes (Signed)
   Subjective:    Patient ID: Linda Buckley, female    DOB: 1956/05/31, 60 y.o.   MRN: [id number]  HPI HA- sxs started last week w/ a 'migraine'.  Throbbing, sensitive to light.  Now pain is localized behind eyes.  On Saturday, developed drainage and matting of L eye.  No ear pain.  + facial swelling on L.  + TTP over maxillary sinuses.   Review of Systems For ROS see HPI     Objective:   Physical Exam  Constitutional: She is oriented to person, place, and time. She appears well-developed and well-nourished. No distress.  HENT:  Head: Normocephalic and atraumatic.  Right Ear: Tympanic membrane normal.  Left Ear: Tympanic membrane normal.  Nose: Mucosal edema and rhinorrhea present. Right sinus exhibits maxillary sinus tenderness and frontal sinus tenderness. Left sinus exhibits maxillary sinus tenderness and frontal sinus tenderness.  Mouth/Throat: Uvula is midline and mucous membranes are normal. Posterior oropharyngeal erythema present. No oropharyngeal exudate.  Eyes: EOM are normal. Pupils are equal, round, and reactive to light. Left eye exhibits discharge.  L conjunctival injxn  Neck: Normal range of motion. Neck supple.  Cardiovascular: Normal rate, regular rhythm and normal heart sounds.   Pulmonary/Chest: Effort normal and breath sounds normal. No respiratory distress. She has no wheezes.  Lymphadenopathy:    She has no cervical adenopathy.  Neurological: She is alert and oriented to person, place, and time.  Vitals reviewed.         Assessment & Plan:  L conjunctivitis- new.  Likely due to her sinus infxn.  Will start abx drops due to discomfort in L eye.  Reviewed supportive care and red flags that should prompt return.  Pt expressed understanding and is in agreement w/ plan.   Maxillary sinusitis- new.  Pt's sxs and PE consistent w/ infxn and likely the cause of her migraine.  Start abx.  Reviewed supportive care and red flags that should prompt return.  Pt  expressed understanding and is in agreement w/ plan.

## 2016-07-22 NOTE — Progress Notes (Signed)
Pre visit review using our clinic review tool, if applicable. No additional management support is needed unless otherwise documented below in the visit note. 

## 2016-07-24 ENCOUNTER — Other Ambulatory Visit: Payer: Self-pay | Admitting: Neurology

## 2016-07-24 NOTE — Telephone Encounter (Signed)
Rx printed, signed, faxed to pharmacy. 

## 2016-08-02 ENCOUNTER — Ambulatory Visit (INDEPENDENT_AMBULATORY_CARE_PROVIDER_SITE_OTHER): Payer: BLUE CROSS/BLUE SHIELD | Admitting: Neurology

## 2016-08-02 ENCOUNTER — Encounter: Payer: Self-pay | Admitting: Neurology

## 2016-08-02 VITALS — BP 124/86 | HR 66 | Ht 66.5 in | Wt 233.0 lb

## 2016-08-02 DIAGNOSIS — G609 Hereditary and idiopathic neuropathy, unspecified: Secondary | ICD-10-CM

## 2016-08-02 DIAGNOSIS — M542 Cervicalgia: Secondary | ICD-10-CM | POA: Diagnosis not present

## 2016-08-02 DIAGNOSIS — M797 Fibromyalgia: Secondary | ICD-10-CM

## 2016-08-02 MED ORDER — PREGABALIN 75 MG PO CAPS
ORAL_CAPSULE | ORAL | 5 refills | Status: DC
Start: 2016-08-02 — End: 2017-01-07

## 2016-08-02 NOTE — Progress Notes (Signed)
Reason for visit: fibromyalgia  Linda Buckley is an 60 y.o. female  History of present illness:  Linda Buckley is a 60 year old right-handed black female with a history of pain in the back, legs, neck, shoulders that has been persistent. The patient is on Cymbalta and Lyrica. She does not sleep well at night, and she is sleepy during the day. She has recently twisted her left ankle, she has a brace on the ankle. The patient has bilateral carpal tunnel syndrome and wears braces bilaterally. She is already had carpal tunnel syndrome twice on the right side. The patient has evidence of a mild peripheral neuropathy by nerve conduction studies. She reports some ongoing tingling sensations in the feet. She has crawling sensations in the lower extremities above and below the knees. MRI of the low back as been done and does not show an etiology for her discomfort. The patient has had cervical spine surgery previously. She believes that her neck and shoulder discomfort has significant worsening over the last 3 weeks. She returns for an evaluation.  Past Medical History:  Diagnosis Date  . Anxiety   . Arthritis   . Carpal tunnel syndrome of right wrist    RECURRENT  . Depression   . Environmental allergies    dust mites, pollen, roaches and other insects, mold  . Headache    migraines  . Hereditary and idiopathic peripheral neuropathy 04/03/2016  . History of adenomatous polyp of colon   . History of gastric ulcer   . History of kidney stones   . Hypertension   . Pneumonia   . Wears glasses     Past Surgical History:  Procedure Laterality Date  . CARPAL TUNNEL RELEASE Right 2007  . CARPAL TUNNEL RELEASE Right 07/05/2015   Procedure: RIGHT HAND REVISION CARPAL TUNNEL RELEASE;  Surgeon: Iran Planas, MD;  Location: [facility];  Service: Orthopedics;  Laterality: Right;  . CERVICAL DISC ARTHROPLASTY  03/2015  . COLONOSCOPY W/ POLYPECTOMY  04-15-2014  . CYSTO/  RIGHT RETROGRADE PYELOGRAM/  URETEROSCOPY STONE EXTRACTION/  STENT PLACEMENT  06-07-2010  . LAPAROSCOPIC ASSISTED VAGINAL HYSTERECTOMY  04-25-2004   ovaries remain  . TUBAL LIGATION  1982    Family History  Problem Relation Age of Onset  . Asthma Father   . Arthritis Mother   . Hypertension Mother   . Diabetes Mother   . Cancer Mother   . Diabetes Brother   . Diabetes Brother   . Asthma Son   . Cancer Maternal Grandfather     colon  . Colon cancer Maternal Grandfather   . Cancer Maternal Aunt     breast  . Cancer Maternal Uncle     lung  . Colon cancer Maternal Uncle   . Cancer Maternal Grandmother     ovarian  . Diabetes Son     Social history:  reports that she has never smoked. She has never used smokeless tobacco. She reports that she drinks alcohol. She reports that she does not use drugs.    Allergies  Allergen Reactions  . Aspirin Swelling    Sweating/ swelling of hands and face   . Bee Venom Swelling    Swelling at site     Medications:  Prior to Admission medications   Medication Sig Start Date End Date Taking? Authorizing Provider  ALPRAZolam Duanne Moron) 0.5 MG tablet Take 1 tablet (0.5 mg total) by mouth 2 (two) times daily as needed for anxiety. 07/10/16  Yes Midge Minium,  MD  amoxicillin (AMOXIL) 875 MG tablet Take 1 tablet (875 mg total) by mouth 2 (two) times daily. 07/22/16  Yes Midge Minium, MD  Ascorbic Acid (VITAMIN C PO) Take 1 tablet by mouth daily.   Yes Historical Provider, MD  buPROPion (WELLBUTRIN XL) 150 MG 24 hr tablet Take 1 tablet (150 mg total) by mouth daily. 06/03/16  Yes Midge Minium, MD  Cholecalciferol (VITAMIN D PO) Take 1 tablet by mouth daily.   Yes Historical Provider, MD  clonazePAM (KLONOPIN) 0.5 MG tablet Take 1 tablet (0.5 mg total) by mouth at bedtime. 07/01/16  Yes Midge Minium, MD  DULoxetine (CYMBALTA) 60 MG capsule Take 1 capsule (60 mg total) by mouth daily. 04/22/16  Yes Midge Minium, MD  EPINEPHrine (EPIPEN) 0.3 mg/0.3 mL SOAJ  injection Inject 0.3 mg into the muscle once.   Yes Historical Provider, MD  losartan-hydrochlorothiazide (HYZAAR) 50-12.5 MG tablet TAKE 1 TABLET BY MOUTH DAILY 05/24/16  Yes Midge Minium, MD  LYRICA 75 MG capsule TAKE ONE CAPSULE BY MOUTH TWICE DAILY 07/24/16  Yes Kathrynn Ducking, MD  ranitidine (ZANTAC) 300 MG tablet TAKE 1 TABLET(300 MG) BY MOUTH AT BEDTIME 06/26/16  Yes Midge Minium, MD  trimethoprim-polymyxin b (POLYTRIM) ophthalmic solution 2 drops in L eye 3x/day x5 days 07/22/16  Yes Midge Minium, MD  VENTOLIN HFA 108 (90 Base) MCG/ACT inhaler INHALE 2 PUFFS BY MOUTH EVERY 4 HOURS AS NEEDED FOR SHORTNESS OF BREATH 04/05/16  Yes Midge Minium, MD  VITAMIN E PO Take 1 tablet by mouth daily.   Yes Historical Provider, MD    ROS:  Out of a complete 14 system review of symptoms, the patient complains only of the following symptoms, and all other reviewed systems are negative.  Decreased activity, fatigue Blurred vision Shortness of breath Restless legs, daytime sleepiness, snoring Joint pain, back pain, aching muscles, muscle cramps, walking difficulty, neck pain, neck stiffness Headache Depression, anxiety Insomnia  Blood pressure 124/86, pulse 66, height 5' 6.5" (1.689 m), weight 233 lb (105.7 kg).  Physical Exam  General: The patient is alert and cooperative at the time of the examination.the patient is moderately to markedly obese.  Neuromuscular: Range of movement of the cervical spine is full.  Skin: No significant peripheral edema is noted.   Neurologic Exam  Mental status: The patient is alert and oriented x 3 at the time of the examination. The patient has apparent normal recent and remote memory, with an apparently normal attention span and concentration ability.   Cranial nerves: Facial symmetry is present. Speech is normal, no aphasia or dysarthria is noted. Extraocular movements are full. Visual fields are full.  Motor: The patient has good  strength in all 4 extremities.  Sensory examination: Soft touch sensation is symmetric on the face, arms, and legs.  Coordination: The patient has good finger-nose-finger and heel-to-shin bilaterally.  Gait and station: The patient has a normal gait. Tandem gait is normal. Romberg is negative. No drift is seen.  Reflexes: Deep tendon reflexes are symmetric.   Assessment/Plan:  1. Fibromyalgia  2. Bilateral carpal tunnel syndrome  3. Mild peripheral neuropathy  The patient is having increased pain in the neck and shoulders, a repeat MRI of the cervical spine. The patient go up on the Lyrica taking 75 mg the morning and 150 mg in the evening. Hopefully this will help her pain and her insomnia issues. The patient will follow-up in 4 months.  Jill Alexanders  MD 08/02/2016 1:00 PM  [facility] [address] [place],  [address]  Phone [phone] Fax [phone]

## 2016-08-02 NOTE — Patient Instructions (Signed)
   We will go up on the Lyrica taking the 75 mg capsules one in the morning and two in the evening. We will check MRI of the neck.

## 2016-08-21 ENCOUNTER — Ambulatory Visit (INDEPENDENT_AMBULATORY_CARE_PROVIDER_SITE_OTHER): Payer: BLUE CROSS/BLUE SHIELD

## 2016-08-21 DIAGNOSIS — M542 Cervicalgia: Secondary | ICD-10-CM

## 2016-08-24 ENCOUNTER — Other Ambulatory Visit: Payer: Self-pay | Admitting: Family Medicine

## 2016-08-24 ENCOUNTER — Telehealth: Payer: Self-pay | Admitting: Neurology

## 2016-08-24 NOTE — Telephone Encounter (Signed)
I called the patient. The MRI of the cervical spine is obscurred at the C5-6 level, but otherwise looks good. No indication for surgery, will continue medical therapy.   MRI cervical 08/23/16:  IMPRESSION:  Abnormal MRI cervical spine (without) demonstrating: 1. Metallic artifact at 99991111 from prior cervical disc arthroplasty. This partially obscures the central canal and neural foramina at this level. 2. Remaining visualized levels are unremarkable.

## 2016-09-04 ENCOUNTER — Ambulatory Visit (INDEPENDENT_AMBULATORY_CARE_PROVIDER_SITE_OTHER): Payer: BLUE CROSS/BLUE SHIELD | Admitting: Family Medicine

## 2016-09-04 ENCOUNTER — Encounter: Payer: Self-pay | Admitting: Family Medicine

## 2016-09-04 VITALS — BP 120/72 | HR 83 | Temp 99.0°F | Resp 16 | Ht 67.0 in | Wt 242.0 lb

## 2016-09-04 DIAGNOSIS — R296 Repeated falls: Secondary | ICD-10-CM | POA: Insufficient documentation

## 2016-09-04 DIAGNOSIS — R5383 Other fatigue: Secondary | ICD-10-CM | POA: Diagnosis not present

## 2016-09-04 LAB — CBC WITH DIFFERENTIAL/PLATELET
BASOS PCT: 1.2 % (ref 0.0–3.0)
Basophils Absolute: 0.1 10*3/uL (ref 0.0–0.1)
Eosinophils Absolute: 0.1 10*3/uL (ref 0.0–0.7)
Eosinophils Relative: 2.3 % (ref 0.0–5.0)
HEMATOCRIT: 37.2 % (ref 36.0–46.0)
Hemoglobin: 12.4 g/dL (ref 12.0–15.0)
LYMPHS PCT: 22.7 % (ref 12.0–46.0)
Lymphs Abs: 1 10*3/uL (ref 0.7–4.0)
MCHC: 33.3 g/dL (ref 30.0–36.0)
MCV: 91.3 fl (ref 78.0–100.0)
MONOS PCT: 7.7 % (ref 3.0–12.0)
Monocytes Absolute: 0.3 10*3/uL (ref 0.1–1.0)
NEUTROS ABS: 2.8 10*3/uL (ref 1.4–7.7)
Neutrophils Relative %: 66.1 % (ref 43.0–77.0)
PLATELETS: 220 10*3/uL (ref 150.0–400.0)
RBC: 4.07 Mil/uL (ref 3.87–5.11)
RDW: 13.6 % (ref 11.5–15.5)
WBC: 4.2 10*3/uL (ref 4.0–10.5)

## 2016-09-04 LAB — BASIC METABOLIC PANEL
BUN: 14 mg/dL (ref 6–23)
CO2: 32 meq/L (ref 19–32)
Calcium: 9.1 mg/dL (ref 8.4–10.5)
Chloride: 104 mEq/L (ref 96–112)
Creatinine, Ser: 0.99 mg/dL (ref 0.40–1.20)
GFR: 74.11 mL/min (ref >60.00)
GLUCOSE: 87 mg/dL (ref 70–99)
POTASSIUM: 3.8 meq/L (ref 3.5–5.1)
SODIUM: 143 meq/L (ref 135–145)

## 2016-09-04 LAB — SEDIMENTATION RATE: Sed Rate: 8 mm/hr (ref 0–30)

## 2016-09-04 LAB — TSH: TSH: 1.06 u[IU]/mL (ref 0.35–4.50)

## 2016-09-04 NOTE — Assessment & Plan Note (Signed)
Recurrent problem for pt.  Suspect this has to do w/ her fibromyalgia and poor sleep.  Start melatonin nightly.  Check labs to r/o metabolic cause such as anemia or thyroid abnormality.  Encouraged regular activity to build stamina.  Will follow.

## 2016-09-04 NOTE — Progress Notes (Signed)
Pre visit review using our clinic review tool, if applicable. No additional management support is needed unless otherwise documented below in the visit note. 

## 2016-09-04 NOTE — Assessment & Plan Note (Signed)
New to provider, ongoing for pt but increasing in frequency.  Pt is already in PT and has ortho and neuro.  Encouraged her to discuss this w/ all of her specialists.  Will check ANA and ESR to r/o rheumatologic cause of falls such as myositis.  Pt expressed understanding and is in agreement w/ plan.

## 2016-09-04 NOTE — Progress Notes (Signed)
   Subjective:    Patient ID: Linda Buckley, female    DOB: 1956/01/06, 60 y.o.   MRN: [id number]  HPI Fatigue- 'i'm so tired all the time'.  Pt reports she will be walking and 'i'll just fall'.  Pt reports she has been falling 'quite regular lately'- 5x in the last month.  Pt reports her knees will just 'give out'.  Pt has not talked to her orthopedic doctor or Dr Jannifer Franklin (Neuro).  Pt has upcoming surgery on L hand so she is not able to use a cane.  Pt is not sleeping well at night even w/ Clonazepam.  Pt is currently going to [facility].   Review of Systems For ROS see HPI     Objective:   Physical Exam  Constitutional: She is oriented to person, place, and time. She appears well-developed and well-nourished. No distress.  obese  HENT:  Head: Normocephalic and atraumatic.  Eyes: Conjunctivae and EOM are normal. Pupils are equal, round, and reactive to light.  Neck: Normal range of motion. Neck supple. Thyromegaly (mild diffuse thyromegaly) present.  Cardiovascular: Normal rate, regular rhythm, normal heart sounds and intact distal pulses.   No murmur heard. Pulmonary/Chest: Effort normal and breath sounds normal. No respiratory distress.  Abdominal: Soft. She exhibits no distension. There is no tenderness.  Musculoskeletal: She exhibits no edema.  Lymphadenopathy:    She has no cervical adenopathy.  Neurological: She is alert and oriented to person, place, and time. She has normal reflexes. No cranial nerve deficit. Coordination (antalgic gait) abnormal.  Skin: Skin is warm and dry.  Psychiatric: She has a normal mood and affect. Her behavior is normal.  Vitals reviewed.         Assessment & Plan:

## 2016-09-04 NOTE — Patient Instructions (Signed)
Follow up as needed We'll notify you of your lab results and make any changes if needed Please mention your falls to your physical therapist, neurologist, and/or orthopedist Start taking OTC Melatonin prior to bed to help with sleep As hard as it is, try and be active to help combat fatigue Call with any questions or concerns Hang in there!!!

## 2016-09-05 ENCOUNTER — Encounter: Payer: Self-pay | Admitting: General Practice

## 2016-09-05 LAB — ANA: ANA: NEGATIVE

## 2016-09-10 ENCOUNTER — Telehealth: Payer: Self-pay | Admitting: Neurology

## 2016-09-10 NOTE — Telephone Encounter (Signed)
Patient called to advise, she's been so tired for 1 1/2-2 weeks, gets very fatigued, headache, has taken Advil and is now taking Aleve, helps headache a little bit but wakes up the next morning and it's back, legs are weak, fell twice, broke toe 2 weeks ago, having balance problems, "I've had these problems all along but seems to be getting worse".

## 2016-09-10 NOTE — Telephone Encounter (Signed)
I called the patient. The patient is having increased problems with fatigue, she feels weak in the legs, feels that the balance is not good, she is falling. She indicates that she believes that she is sleeping better at night. I will try to get her worked in the next week or 2 for another evaluation.

## 2016-09-11 ENCOUNTER — Other Ambulatory Visit: Payer: Self-pay | Admitting: Emergency Medicine

## 2016-09-11 MED ORDER — DULOXETINE HCL 60 MG PO CPEP: 60.0000 mg | ORAL_CAPSULE | Freq: Every day | ORAL | 3 refills | Status: DC

## 2016-09-11 NOTE — Telephone Encounter (Signed)
Called and left VM mssg for pt to call back and schedule appt.

## 2016-09-12 ENCOUNTER — Ambulatory Visit (INDEPENDENT_AMBULATORY_CARE_PROVIDER_SITE_OTHER): Payer: BLUE CROSS/BLUE SHIELD | Admitting: Neurology

## 2016-09-12 ENCOUNTER — Encounter: Payer: Self-pay | Admitting: Neurology

## 2016-09-12 VITALS — BP 133/79 | HR 77 | Ht 67.0 in | Wt 243.0 lb

## 2016-09-12 DIAGNOSIS — R5383 Other fatigue: Secondary | ICD-10-CM

## 2016-09-12 DIAGNOSIS — G479 Sleep disorder, unspecified: Secondary | ICD-10-CM | POA: Diagnosis not present

## 2016-09-12 DIAGNOSIS — M797 Fibromyalgia: Secondary | ICD-10-CM | POA: Diagnosis not present

## 2016-09-12 MED ORDER — DULOXETINE HCL 30 MG PO CPEP: 30.0000 mg | ORAL_CAPSULE | Freq: Every day | ORAL | 3 refills | Status: DC

## 2016-09-12 NOTE — Patient Instructions (Signed)
   We will increase the cymbalta to taking 60 mg in the morning and 30 mg in the evening. We will get a sleep doctor to see you.

## 2016-09-12 NOTE — Telephone Encounter (Signed)
Called patient this morning - she has been worked in to see Dr. Jannifer Franklin today.  She will arrive to our office at 11:45am.

## 2016-09-12 NOTE — Progress Notes (Signed)
Reason for visit: Fatigue, pain  MERILYN FOBES is an 60 y.o. female  History of present illness:  Ms. Hiott is a 60 year old right-handed black female with a history of fibromyalgia, and a prior history of cervical spine surgery. The patient has ongoing discomfort at the base of the neck and shoulders. The patient has significant neuromuscular pain throughout. She is having exercise intolerance, she has difficulty walking, she may feel as if her knees will collapse. The patient may have sharp pains on the top and bottom of the feet, she has had a mild peripheral neuropathy documented by nerve conduction studies. She has had carpal tunnel syndrome previously. She gets somewhat short winded when she tries to walk. She reports a lot of excessive daytime drowsiness, she is unable to drive longer distances because she gets so drowsy, she will fall asleep if she is inactive. She has significant foot pain with walking. She is on Lyrica and Cymbalta. These medications are not controlling her symptoms. She has had neuromuscular pain for over 5 years.  Past Medical History:  Diagnosis Date  . Anxiety   . Arthritis   . Carpal tunnel syndrome of right wrist    RECURRENT  . Depression   . Environmental allergies    dust mites, pollen, roaches and other insects, mold  . Headache    migraines  . Hereditary and idiopathic peripheral neuropathy 04/03/2016  . History of adenomatous polyp of colon   . History of gastric ulcer   . History of kidney stones   . Hypertension   . Pneumonia   . Wears glasses     Past Surgical History:  Procedure Laterality Date  . CARPAL TUNNEL RELEASE Right 2007  . CARPAL TUNNEL RELEASE Right 07/05/2015   Procedure: RIGHT HAND REVISION CARPAL TUNNEL RELEASE;  Surgeon: Iran Planas, MD;  Location: [facility];  Service: Orthopedics;  Laterality: Right;  . CERVICAL DISC ARTHROPLASTY  03/2015  . COLONOSCOPY W/ POLYPECTOMY  04-15-2014  . CYSTO/  RIGHT RETROGRADE PYELOGRAM/  URETEROSCOPY STONE EXTRACTION/  STENT PLACEMENT  06-07-2010  . LAPAROSCOPIC ASSISTED VAGINAL HYSTERECTOMY  04-25-2004   ovaries remain  . TUBAL LIGATION  1982    Family History  Problem Relation Age of Onset  . Asthma Father   . Arthritis Mother   . Hypertension Mother   . Diabetes Mother   . Cancer Mother   . Diabetes Brother   . Diabetes Brother   . Asthma Son   . Cancer Maternal Grandfather     colon  . Colon cancer Maternal Grandfather   . Cancer Maternal Aunt     breast  . Cancer Maternal Uncle     lung  . Colon cancer Maternal Uncle   . Cancer Maternal Grandmother     ovarian  . Diabetes Son     Social history:  reports that she has never smoked. She has never used smokeless tobacco. She reports that she drinks alcohol. She reports that she does not use drugs.    Allergies  Allergen Reactions  . Aspirin Swelling    Sweating/ swelling of hands and face   . Bee Venom Swelling    Swelling at site     Medications:  Prior to Admission medications   Medication Sig Start Date End Date Taking? Authorizing Provider  ALPRAZolam Duanne Moron) 0.5 MG tablet Take 1 tablet (0.5 mg total) by mouth 2 (two) times daily as needed for anxiety. 07/10/16  Yes Midge Minium, MD  buPROPion (  WELLBUTRIN XL) 150 MG 24 hr tablet Take 1 tablet (150 mg total) by mouth daily. 06/03/16  Yes Midge Minium, MD  Cholecalciferol (VITAMIN D PO) Take 1 tablet by mouth daily.   Yes Historical Provider, MD  clonazePAM (KLONOPIN) 0.5 MG tablet Take 1 tablet (0.5 mg total) by mouth at bedtime. 07/01/16  Yes Midge Minium, MD  DULoxetine (CYMBALTA) 60 MG capsule Take 1 capsule (60 mg total) by mouth daily. 09/11/16  Yes Midge Minium, MD  EPINEPHrine (EPIPEN) 0.3 mg/0.3 mL SOAJ injection Inject 0.3 mg into the muscle once.   Yes Historical Provider, MD  losartan-hydrochlorothiazide (HYZAAR) 50-12.5 MG tablet TAKE 1 TABLET BY MOUTH EVERY DAY 08/26/16  Yes Midge Minium, MD  ondansetron  (ZOFRAN-ODT) 4 MG disintegrating tablet Take by mouth.   Yes Historical Provider, MD  oxyCODONE-acetaminophen (PERCOCET) 7.5-325 MG tablet TK 1 T PO BID PRN P 08/16/16  Yes Historical Provider, MD  pregabalin (LYRICA) 75 MG capsule One capsule in the morning and two in the evening 08/02/16  Yes Kathrynn Ducking, MD  ranitidine (ZANTAC) 300 MG tablet TAKE 1 TABLET(300 MG) BY MOUTH AT BEDTIME 06/26/16  Yes Midge Minium, MD  VENTOLIN HFA 108 (90 Base) MCG/ACT inhaler INHALE 2 PUFFS BY MOUTH EVERY 4 HOURS AS NEEDED FOR SHORTNESS OF BREATH 04/05/16  Yes Midge Minium, MD    ROS:  Out of a complete 14 system review of symptoms, the patient complains only of the following symptoms, and all other reviewed systems are negative.  Fatigue Hearing loss Blurred vision Wheezing, shortness of breath Restless legs, daytime sleepiness, snoring Back pain, achy muscles, walking difficulty Bruising easily Memory loss, headache, weakness Confusion, depression, anxiety  Blood pressure 133/79, pulse 77, height 5\' 7"  (1.702 m), weight 243 lb (110.2 kg).  Physical Exam  General: The patient is alert and cooperative at the time of the examination. The patient is moderately to markedly obese.  Skin: No significant peripheral edema is noted.   Neurologic Exam  Mental status: The patient is alert and oriented x 3 at the time of the examination. The patient has apparent normal recent and remote memory, with an apparently normal attention span and concentration ability.   Cranial nerves: Facial symmetry is present. Speech is normal, no aphasia or dysarthria is noted. Extraocular movements are full. Visual fields are full.  Motor: The patient has good strength in all 4 extremities.  Sensory examination: Soft touch sensation is symmetric on the face, arms, and legs.  Coordination: The patient has good finger-nose-finger and heel-to-shin bilaterally.  Gait and station: The patient has a slow, slightly  wide-based gait. The patient is able to perform tandem gait. Romberg is negative.  Reflexes: Deep tendon reflexes are symmetric.   Assessment/Plan:  1. Fibromyalgia  2. Chronic fatigue  3. Excessive daytime drowsiness  The patient will be increased on the Cymbalta taking 30 mg in the morning, 60 mg in the evening. The patient will be referred for a sleep evaluation given the excessive daytime drowsiness and history of snoring. She was evaluated for sleep apnea 10 years ago and she was told that she was borderline at that time. She will follow-up for her next scheduled appointment.  Jill Alexanders MD 09/12/2016 1:04 PM  [facility] [address] [place],  [address]  Phone [phone] Fax [phone]

## 2016-09-15 HISTORY — PX: CARPAL TUNNEL RELEASE: SHX101

## 2016-09-24 ENCOUNTER — Other Ambulatory Visit: Payer: Self-pay | Admitting: Family Medicine

## 2016-10-03 ENCOUNTER — Institutional Professional Consult (permissible substitution): Payer: BLUE CROSS/BLUE SHIELD | Admitting: Neurology

## 2016-10-07 ENCOUNTER — Other Ambulatory Visit: Payer: Self-pay | Admitting: Family Medicine

## 2016-10-08 ENCOUNTER — Telehealth: Payer: Self-pay | Admitting: General Practice

## 2016-10-08 MED ORDER — CLONAZEPAM 0.5 MG PO TABS: 0.5000 mg | ORAL_TABLET | Freq: Every day | ORAL | 3 refills | Status: DC

## 2016-10-08 NOTE — Telephone Encounter (Signed)
Ok for #30, 3 refills 

## 2016-10-08 NOTE — Telephone Encounter (Signed)
Medication filled to pharmacy as requested.   

## 2016-10-08 NOTE — Telephone Encounter (Signed)
Last OV 09/04/16 Clonazepam last filled 07/01/16 #30 with 1

## 2016-10-09 ENCOUNTER — Ambulatory Visit (INDEPENDENT_AMBULATORY_CARE_PROVIDER_SITE_OTHER): Payer: BLUE CROSS/BLUE SHIELD | Admitting: Neurology

## 2016-10-09 ENCOUNTER — Encounter: Payer: Self-pay | Admitting: Neurology

## 2016-10-09 VITALS — BP 118/84 | HR 84 | Resp 20 | Ht 66.0 in | Wt 246.0 lb

## 2016-10-09 DIAGNOSIS — M25562 Pain in left knee: Secondary | ICD-10-CM | POA: Diagnosis not present

## 2016-10-09 DIAGNOSIS — M25561 Pain in right knee: Secondary | ICD-10-CM

## 2016-10-09 DIAGNOSIS — G4733 Obstructive sleep apnea (adult) (pediatric): Secondary | ICD-10-CM | POA: Diagnosis not present

## 2016-10-09 DIAGNOSIS — I10 Essential (primary) hypertension: Secondary | ICD-10-CM | POA: Diagnosis not present

## 2016-10-09 DIAGNOSIS — R0683 Snoring: Secondary | ICD-10-CM

## 2016-10-09 DIAGNOSIS — G4711 Idiopathic hypersomnia with long sleep time: Secondary | ICD-10-CM | POA: Diagnosis not present

## 2016-10-09 DIAGNOSIS — G8929 Other chronic pain: Secondary | ICD-10-CM | POA: Diagnosis not present

## 2016-10-09 NOTE — Progress Notes (Signed)
Linda Buckley   Provider:  Larey Buckley, M D  Referring Provider: Midge Minium, MD Primary Care Physician:  Linda Asa, MD    HPI:  Linda Buckley is a female, afro -american , right handed 60 y.o. female , seen here as a referral  from Dr. Jannifer Buckley for a sleep evaluation and possible sleep study,   " I wake up -  because of my body hurting "   Linda Buckley reports that she his snoring, wheezing, weight gain, fatigue, swelling in the lower extremities, a tendency to bruise easily, feels excessively sleepy, has restless legs, not enough sleep depression and anxiety, change in that site, is interested in some activities that used to give her pleasure, slurring of speech headaches confusion. She endorsed migraine depression anxiety and fibromyalgia in her past medical history as well as back surgeries she had a fusion anterior fusion cervical spine 12/04/2015, carpal tunnel, hysterectomy.  He was first told couple of years ago that she snored and was surprised. In the meantime her snoring has much increased in volume but this is one of the reasons why she is here.  Sleep habits are as follows: She goes to bed usually at 9:30 but watches TV in bed, usually watches TV news and roughly by 10:30 he will be asleep. She will switch the TV off and the bedroom will be call, quiet and dark. She is very restless and has no favorite sleep position, she also has a lot of discomfort in various positions. She uses 2 pillows between the legs, one under the right arm and 1 for neck and head support. About 1 AM she will wake and feel discomfort, not the urge to urinate. She rises in the morning at 6:45 AM, and still feels fatigued. She often has to do some stretching sitting for while. She may get 6 hours of sleep at the most overnight.   Sleep medical history and family sleep history:  Brother has insomnia, PTSD. OSA.   Social history: Linda Buckley was married to a [name] service  member and stationed in various continence and countries. She also attended [employer]. The patient has never used tobacco, she seldomly drinks wine, no hard liquor. She drinks coffee daily and she takes one cup in the morning and one cup at night, she reports she experiences a paradox  reaction to caffeine  Dr Linda Buckley patient - last note: Linda Buckley is an 60 y.o. female  History of present illness:  Linda Buckley is a 60 year old right-handed black female with a history of fibromyalgia, and a prior history of cervical spine surgery. The patient has ongoing discomfort at the base of the neck and shoulders. The patient has significant neuromuscular pain throughout. She is having exercise intolerance, she has difficulty walking, she may feel as if her knees will collapse. The patient may have sharp pains on the top and bottom of the feet, she has had a mild peripheral neuropathy documented by nerve conduction studies. She has had carpal tunnel syndrome previously. She gets somewhat short winded when she tries to walk. She reports a lot of excessive daytime drowsiness, she is unable to drive longer distances because she gets so drowsy, she will fall asleep if she is inactive. She has significant foot pain with walking. She is on Lyrica and Cymbalta. These medications are not controlling her symptoms. She has had neuromuscular pain for over 5 years.   Review of Systems: Out of a complete 14 system  review, the patient complains of only the following symptoms, and all other reviewed systems are negative.   Epworth score 20 , Fatigue severity score 63 , depression score 7/15 .    Social History   Social History  . Marital status: Married    Spouse name: N/A  . Number of children: 2  . Years of education: 2 yrs college   Occupational History  . [employer]     Dance movement psychotherapist   Social History Main Topics  . Smoking status: Never Smoker  . Smokeless tobacco: Never Used  . Alcohol use Yes       Comment: 2 glasses of wine weekly  . Drug use: No  . Sexual activity: Not on file   Other Topics Concern  . Not on file   Social History Narrative   Lives at home w/ her husband   Right-handed   Drinks 1-2 cups of coffee daily and 2 cups of tea per week    Family History  Problem Relation Age of Onset  . Asthma Father   . Arthritis Mother   . Hypertension Mother   . Diabetes Mother   . Cancer Mother   . Diabetes Brother   . Diabetes Brother   . Asthma Son   . Cancer Maternal Grandfather     colon  . Colon cancer Maternal Grandfather   . Cancer Maternal Aunt     breast  . Cancer Maternal Uncle     lung  . Colon cancer Maternal Uncle   . Cancer Maternal Grandmother     ovarian  . Diabetes Son     Past Medical History:  Diagnosis Date  . Anxiety   . Arthritis   . Carpal tunnel syndrome of right wrist    RECURRENT  . Depression   . Environmental allergies    dust mites, pollen, roaches and other insects, mold  . Headache    migraines  . Hereditary and idiopathic peripheral neuropathy 04/03/2016  . History of adenomatous polyp of colon   . History of gastric ulcer   . History of kidney stones   . Hypertension   . Pneumonia   . Wears glasses     Past Surgical History:  Procedure Laterality Date  . CARPAL TUNNEL RELEASE Right 2007  . CARPAL TUNNEL RELEASE Right 07/05/2015   Procedure: RIGHT HAND REVISION CARPAL TUNNEL RELEASE;  Surgeon: Iran Planas, MD;  Location: [facility];  Service: Orthopedics;  Laterality: Right;  . CERVICAL DISC ARTHROPLASTY  03/2015  . COLONOSCOPY W/ POLYPECTOMY  04-15-2014  . CYSTO/  RIGHT RETROGRADE PYELOGRAM/ URETEROSCOPY STONE EXTRACTION/  STENT PLACEMENT  06-07-2010  . LAPAROSCOPIC ASSISTED VAGINAL HYSTERECTOMY  04-25-2004   ovaries remain  . TUBAL LIGATION  1982    Current Outpatient Prescriptions  Medication Sig Dispense Refill  . ALPRAZolam (XANAX) 0.5 MG tablet Take 1 tablet (0.5 mg total) by mouth 2 (two) times daily as  needed for anxiety. 60 tablet 1  . buPROPion (WELLBUTRIN XL) 150 MG 24 hr tablet TAKE 1 TABLET(150 MG) BY MOUTH DAILY 30 tablet 0  . Cholecalciferol (VITAMIN D PO) Take 1 tablet by mouth daily.    . clonazePAM (KLONOPIN) 0.5 MG tablet Take 1 tablet (0.5 mg total) by mouth at bedtime. 30 tablet 3  . DULoxetine (CYMBALTA) 30 MG capsule Take 1 capsule (30 mg total) by mouth daily. 30 capsule 3  . DULoxetine (CYMBALTA) 60 MG capsule Take 1 capsule (60 mg total) by mouth daily. 30 capsule  3  . EPINEPHrine (EPIPEN) 0.3 mg/0.3 mL SOAJ injection Inject 0.3 mg into the muscle once.    Marland Kitchen losartan-hydrochlorothiazide (HYZAAR) 50-12.5 MG tablet TAKE 1 TABLET BY MOUTH EVERY DAY 90 tablet 1  . ondansetron (ZOFRAN-ODT) 4 MG disintegrating tablet Take by mouth.    . oxyCODONE-acetaminophen (PERCOCET) 7.5-325 MG tablet TK 1 T PO BID PRN P  0  . pregabalin (LYRICA) 75 MG capsule One capsule in the morning and two in the evening 90 capsule 5  . ranitidine (ZANTAC) 300 MG tablet TAKE 1 TABLET(300 MG) BY MOUTH AT BEDTIME 30 tablet 6  . VENTOLIN HFA 108 (90 Base) MCG/ACT inhaler INHALE 2 PUFFS BY MOUTH EVERY 4 HOURS AS NEEDED FOR SHORTNESS OF BREATH 36 g 0   No current facility-administered medications for this visit.     Allergies as of 10/09/2016 - Review Complete 09/12/2016  Allergen Reaction Noted  . Aspirin Swelling 10/11/2013  . Bee venom Swelling 07/04/2015    Vitals: BP 118/84   Pulse 84   Resp 20   Ht 5\' 6"  (1.676 m)   Wt 246 lb (111.6 kg)   BMI 39.71 kg/m  Last Weight:  Wt Readings from Last 1 Encounters:  10/09/16 246 lb (111.6 kg)   [id number] mass index is 39.71 kg/m.     Last Height:   Ht Readings from Last 1 Encounters:  10/09/16 5\' 6"  (1.676 m)    Physical exam:  General: The patient is awake, alert and appears not in acute distress. The patient is well groomed. Head: Normocephalic, atraumatic. Neck is supple. Mallampati 3 ,  neck circumference:14. Nasal airflow patent , TMJ is  evident . Retrognathia is seen.  Cardiovascular:  Regular rate and rhythm, without  murmurs or carotid bruit, and without distended neck veins. Respiratory: Lungs are clear to auscultation. Skin:  Without evidence of edema, or rash Trunk: BMI is elevated  Neurologic exam : The patient is awake and alert, oriented to place and time.   Memory subjective  described as intact.   Attention span & concentration ability appears normal.  Speech is fluent,   Without dysarthria, dysphonia or aphasia.  Mood and affect are appropriate.  Cranial nerves: Pupils are equal and briskly reactive to light. Extraocular movements  in vertical and horizontal planes intact and without nystagmus. Visual fields by finger perimetry are intact. Hearing to finger rub intact.   Facial sensation intact to fine touch.  Facial motor strength is symmetric and tongue and uvula move midline. Shoulder shrug was symmetrical.   Motor exam:  Normal tone, muscle bulk and symmetric strength in all extremities. The patient was advised of the nature of the diagnosed sleep disorder , the treatment options and risks for general a health and wellness arising from not treating the condition.  I spent more than 45  minutes of face to face time with the patient. Greater than 50% of time was spent in counseling and coordination of care. We have discussed the diagnosis and differential and I answered the patient's questions.     Assessment:  After physical and neurologic examination, review of laboratory studies,  Personal review of imaging studies, reports of other /same  Imaging studies ,  Results of polysomnography/ neurophysiology testing and pre-existing records as far as provided in visit., my assessment is   1) I suspect strongly that the patient's underlying pain disorder interferes with her sleep. She also is a loud snorer now, has witnessed apneas. She had her first sleep study at  [facility], well over 10 years ago. At  the time she was told that she didn't have apnea but in the meantime her body mass index is changed, she is taking different medications some of them sedating some of them muscle relaxants. I like for her to undergo a split night polysomnography    Plan:  Treatment plan and additional workup :  SPLIT night polysomnography. Rv after study if abnormal.      Linda Seat MD  10/09/2016   CC: Linda Minium, Md [name] [place], [place] [address]

## 2016-10-21 ENCOUNTER — Other Ambulatory Visit: Payer: Self-pay | Admitting: Family Medicine

## 2016-10-21 ENCOUNTER — Encounter: Payer: Self-pay | Admitting: Family Medicine

## 2016-10-21 ENCOUNTER — Ambulatory Visit (INDEPENDENT_AMBULATORY_CARE_PROVIDER_SITE_OTHER): Payer: BLUE CROSS/BLUE SHIELD | Admitting: Family Medicine

## 2016-10-21 ENCOUNTER — Encounter: Payer: Self-pay | Admitting: General Practice

## 2016-10-21 VITALS — BP 123/83 | HR 82 | Temp 99.3°F | Resp 18 | Ht 66.0 in | Wt 247.5 lb

## 2016-10-21 DIAGNOSIS — R062 Wheezing: Secondary | ICD-10-CM

## 2016-10-21 DIAGNOSIS — J0110 Acute frontal sinusitis, unspecified: Secondary | ICD-10-CM | POA: Diagnosis not present

## 2016-10-21 MED ORDER — AMOXICILLIN 875 MG PO TABS: 875.0000 mg | ORAL_TABLET | Freq: Two times a day (BID) | ORAL | 0 refills | Status: DC

## 2016-10-21 MED ORDER — IPRATROPIUM-ALBUTEROL 0.5-2.5 (3) MG/3ML IN SOLN
3.0000 mL | Freq: Once | RESPIRATORY_TRACT | Status: AC
  Administered 2016-10-21: 3 mL via RESPIRATORY_TRACT

## 2016-10-21 MED ORDER — BECLOMETHASONE DIPROPIONATE 80 MCG/ACT IN AERS: 1.0000 | INHALATION_SPRAY | Freq: Two times a day (BID) | RESPIRATORY_TRACT | 3 refills | Status: DC

## 2016-10-21 MED ORDER — GUAIFENESIN-CODEINE 100-10 MG/5ML PO SYRP: 10.0000 mL | ORAL_SOLUTION | Freq: Three times a day (TID) | ORAL | 0 refills | Status: DC | PRN

## 2016-10-21 NOTE — Progress Notes (Signed)
Pre visit review using our clinic review tool, if applicable. No additional management support is needed unless otherwise documented below in the visit note. 

## 2016-10-21 NOTE — Progress Notes (Signed)
   Subjective:    Patient ID: Linda Buckley, female    DOB: Dec 22, 1955, 60 y.o.   MRN: [id number]  HPI URI- since last week pt has been using her albuterol inhaler more frequently.  She thought this was due to trying to walk more.  Husband reported she wheezed all night last night.  Cough started last night.  No fevers.  + laryngitis.  + frontal sinus pressure- improved w/ ibuprofen yesterday.  No known sick contacts.     Review of Systems For ROS see HPI     Objective:   Physical Exam  Constitutional: She is oriented to person, place, and time. She appears well-developed and well-nourished. No distress.  HENT:  Head: Normocephalic and atraumatic.  Right Ear: Tympanic membrane normal.  Left Ear: Tympanic membrane normal.  Nose: Mucosal edema and rhinorrhea present. Right sinus exhibits frontal sinus tenderness. Right sinus exhibits no maxillary sinus tenderness. Left sinus exhibits frontal sinus tenderness. Left sinus exhibits no maxillary sinus tenderness.  Mouth/Throat: Uvula is midline and mucous membranes are normal. Posterior oropharyngeal erythema present. No oropharyngeal exudate.  Eyes: Conjunctivae and EOM are normal. Pupils are equal, round, and reactive to light.  Neck: Normal range of motion. Neck supple.  Cardiovascular: Normal rate, regular rhythm and normal heart sounds.   Pulmonary/Chest: Effort normal. No respiratory distress. She has wheezes (intially pt had poor air movement.  Air movement improved s/p 1st neb tx and then had end expiratory wheezes.  These improved s/p 2nd neb tx).  Lymphadenopathy:    She has no cervical adenopathy.  Neurological: She is alert and oriented to person, place, and time.  Vitals reviewed.         Assessment & Plan:  Sinusitis- new.  Pt's sxs and PE consistent w/ infxn.  Start abx.  Cough meds prn.  Reviewed supportive care and red flags that should prompt return.  Pt expressed understanding and is in agreement w/ plan.    Wheezing- pt has hx of RAD.  sxs improved s/p neb txs in office.  Start abx as above.  Add Qvar for improved air movement.  Albuterol prn.  Cough meds prn.  Reviewed supportive care and red flags that should prompt return.  Pt expressed understanding and is in agreement w/ plan.

## 2016-10-21 NOTE — Telephone Encounter (Signed)
Medication filled to pharmacy as requested.   

## 2016-10-21 NOTE — Patient Instructions (Signed)
Follow up as needed/scheduled Start the Amoxicillin twice daily- take w/ food Start the Qvar 1 puff twice daily until you are feeling better Continue the Albuterol as needed for shortness of breath/wheezing/cough Cheratussin (cough syrup) as needed- will cause drowsiness Mucinex DM for daytime cough and congestion Call with any questions or concerns Hang in there!!!

## 2016-10-21 NOTE — Telephone Encounter (Signed)
Last OV 09/04/16 Alprazolam last filled 07/10/16 #60 with 1

## 2016-10-28 ENCOUNTER — Encounter: Payer: Self-pay | Admitting: Family Medicine

## 2016-10-28 ENCOUNTER — Ambulatory Visit (INDEPENDENT_AMBULATORY_CARE_PROVIDER_SITE_OTHER): Payer: BLUE CROSS/BLUE SHIELD | Admitting: Family Medicine

## 2016-10-28 VITALS — BP 118/78 | HR 84 | Temp 98.1°F | Resp 16 | Ht 66.0 in | Wt 247.1 lb

## 2016-10-28 DIAGNOSIS — Z0000 Encounter for general adult medical examination without abnormal findings: Secondary | ICD-10-CM

## 2016-10-28 DIAGNOSIS — N39490 Overflow incontinence: Secondary | ICD-10-CM | POA: Diagnosis not present

## 2016-10-28 LAB — BASIC METABOLIC PANEL
BUN: 19 mg/dL (ref 6–23)
CALCIUM: 9.5 mg/dL (ref 8.4–10.5)
CHLORIDE: 105 meq/L (ref 96–112)
CO2: 32 meq/L (ref 19–32)
CREATININE: 0.93 mg/dL (ref 0.40–1.20)
GFR: 79.62 mL/min (ref >60.00)
Glucose, Bld: 108 mg/dL — ABNORMAL HIGH (ref 70–99)
Potassium: 4 mEq/L (ref 3.5–5.1)
SODIUM: 144 meq/L (ref 135–145)

## 2016-10-28 LAB — CBC WITH DIFFERENTIAL/PLATELET
BASOS PCT: 0.3 % (ref 0.0–3.0)
Basophils Absolute: 0 10*3/uL (ref 0.0–0.1)
EOS PCT: 7.6 % — AB (ref 0.0–5.0)
Eosinophils Absolute: 0.3 10*3/uL (ref 0.0–0.7)
HEMATOCRIT: 37.3 % (ref 36.0–46.0)
HEMOGLOBIN: 12.4 g/dL (ref 12.0–15.0)
LYMPHS PCT: 23.9 % (ref 12.0–46.0)
Lymphs Abs: 0.9 10*3/uL (ref 0.7–4.0)
MCHC: 33.3 g/dL (ref 30.0–36.0)
MCV: 90.1 fl (ref 78.0–100.0)
Monocytes Absolute: 0.2 10*3/uL (ref 0.1–1.0)
Monocytes Relative: 6.1 % (ref 3.0–12.0)
NEUTROS ABS: 2.3 10*3/uL (ref 1.4–7.7)
Neutrophils Relative %: 62.1 % (ref 43.0–77.0)
PLATELETS: 225 10*3/uL (ref 150.0–400.0)
RBC: 4.14 Mil/uL (ref 3.87–5.11)
RDW: 13 % (ref 11.5–15.5)
WBC: 3.6 10*3/uL — AB (ref 4.0–10.5)

## 2016-10-28 LAB — HEPATIC FUNCTION PANEL
ALT: 37 U/L — ABNORMAL HIGH (ref 0–35)
AST: 36 U/L (ref 0–37)
Albumin: 4.1 g/dL (ref 3.5–5.2)
Alkaline Phosphatase: 77 U/L (ref 39–117)
BILIRUBIN TOTAL: 0.4 mg/dL (ref 0.2–1.2)
Bilirubin, Direct: 0.1 mg/dL (ref 0.0–0.3)
Total Protein: 6.8 g/dL (ref 6.0–8.3)

## 2016-10-28 LAB — VITAMIN D 25 HYDROXY (VIT D DEFICIENCY, FRACTURES): VITD: 28.8 ng/mL — ABNORMAL LOW (ref 30.00–100.00)

## 2016-10-28 LAB — LIPID PANEL
CHOL/HDL RATIO: 3
Cholesterol: 182 mg/dL (ref 0–200)
HDL: 66.4 mg/dL (ref >39.00)
LDL Cholesterol: 94 mg/dL (ref 0–99)
NONHDL: 115.9
Triglycerides: 109 mg/dL (ref 0.0–149.0)
VLDL: 21.8 mg/dL (ref 0.0–40.0)

## 2016-10-28 LAB — TSH: TSH: 0.77 u[IU]/mL (ref 0.35–4.50)

## 2016-10-28 NOTE — Progress Notes (Signed)
   Subjective:    Patient ID: Linda Buckley, female    DOB: 1956/09/21, 60 y.o.   MRN: [id number]  HPI CPE- UTD on mammo, colonoscopy.  No need for paps due to hysterectomy.  Due for flu shot.   Review of Systems Patient reports no hearing changes, adenopathy,fever, weight change,  persistant/recurrent hoarseness , swallowing issues, chest pain, palpitations, edema, persistant/recurrent cough, hemoptysis, gastrointestinal bleeding (melena, rectal bleeding), abdominal pain, significant heartburn, bowel changes, Gyn symptoms (abnormal  bleeding, pain),  syncope, focal weakness, memory loss, skin/hair/nail changes, abnormal bruising or bleeding, anxiety, or depression.   + blurry vision + SOB + bladder incontinence- overflow incontinence + chronic neuropathy    Objective:   Physical Exam General Appearance:    Alert, cooperative, no distress, appears stated age, obese  Head:    Normocephalic, without obvious abnormality, atraumatic  Eyes:    PERRL, conjunctiva/corneas clear, EOM's intact, fundi    benign, both eyes  Ears:    Normal TM's and external ear canals, both ears  Nose:   Nares normal, septum midline, mucosa normal, no drainage    or sinus tenderness  Throat:   Lips, mucosa, and tongue normal; teeth and gums normal  Neck:   Supple, symmetrical, trachea midline, no adenopathy;    Thyroid: no enlargement/tenderness/nodules  Back:     Symmetric, no curvature, ROM normal, no CVA tenderness  Lungs:     Clear to auscultation bilaterally, respirations unlabored  Chest Wall:    No tenderness or deformity   Heart:    Regular rate and rhythm, S1 and S2 normal, no murmur, rub   or gallop  Breast Exam:    Deferred to mammo  Abdomen:     Soft, non-tender, bowel sounds active all four quadrants,    no masses, no organomegaly  Genitalia:    Deferred  Rectal:    Extremities:   Extremities normal, atraumatic, no cyanosis or edema  Pulses:   2+ and symmetric all extremities  Skin:    Skin color, texture, turgor normal, no rashes or lesions  Lymph nodes:   Cervical, supraclavicular, and axillary nodes normal  Neurologic:   CNII-XII intact, normal strength, sensation and reflexes    throughout          Assessment & Plan:

## 2016-10-28 NOTE — Assessment & Plan Note (Signed)
Pt's PE WNL w/ exception of obesity.  Suspect her SOB on exertion is due to deconditioning as her lungs are CTA and there is no sign of volume overload.  UTD on mammo and colonoscopy.  Flu shot given.  Check labs.  Anticipatory guidance provided.

## 2016-10-28 NOTE — Progress Notes (Signed)
Pre visit review using our clinic review tool, if applicable. No additional management support is needed unless otherwise documented below in the visit note. 

## 2016-10-28 NOTE — Patient Instructions (Addendum)
Follow up in 6 months to recheck BP We'll notify you of your lab results and make any changes if needed Continue to work on healthy diet and regular exercise- you can do it! Call and schedule your eye appt Call with any questions or concerns Happy Holidays!!!

## 2016-10-30 ENCOUNTER — Ambulatory Visit
Admission: RE | Admit: 2016-10-30 | Discharge: 2016-10-30 | Disposition: A | Discharge status: Home / Self Care | Payer: BLUE CROSS/BLUE SHIELD | Source: Ambulatory Visit | Attending: Physician Assistant | Admitting: Physician Assistant

## 2016-10-30 ENCOUNTER — Other Ambulatory Visit: Payer: Self-pay | Admitting: General Practice

## 2016-10-30 ENCOUNTER — Ambulatory Visit (INDEPENDENT_AMBULATORY_CARE_PROVIDER_SITE_OTHER): Payer: BLUE CROSS/BLUE SHIELD | Admitting: Physician Assistant

## 2016-10-30 ENCOUNTER — Telehealth: Payer: Self-pay | Admitting: Family Medicine

## 2016-10-30 ENCOUNTER — Encounter: Payer: Self-pay | Admitting: Physician Assistant

## 2016-10-30 VITALS — BP 112/82 | HR 75 | Temp 98.5°F | Resp 16 | Ht 66.0 in | Wt 246.0 lb

## 2016-10-30 DIAGNOSIS — J329 Chronic sinusitis, unspecified: Secondary | ICD-10-CM | POA: Diagnosis not present

## 2016-10-30 DIAGNOSIS — R062 Wheezing: Secondary | ICD-10-CM

## 2016-10-30 MED ORDER — PREDNISONE 20 MG PO TABS: 40.0000 mg | ORAL_TABLET | Freq: Every day | ORAL | 0 refills | Status: DC

## 2016-10-30 MED ORDER — LEVOFLOXACIN 500 MG PO TABS: 500.0000 mg | ORAL_TABLET | Freq: Every day | ORAL | 0 refills | Status: DC

## 2016-10-30 MED ORDER — METHYLPREDNISOLONE ACETATE 40 MG/ML IJ SUSP
40.0000 mg | Freq: Once | INTRAMUSCULAR | Status: AC
  Administered 2016-10-30: 40 mg via INTRAMUSCULAR

## 2016-10-30 MED ORDER — VITAMIN D (ERGOCALCIFEROL) 1.25 MG (50000 UNIT) PO CAPS: 50000.0000 [IU] | ORAL_CAPSULE | ORAL | 0 refills | Status: DC

## 2016-10-30 MED ORDER — IPRATROPIUM-ALBUTEROL 0.5-2.5 (3) MG/3ML IN SOLN
3.0000 mL | Freq: Once | RESPIRATORY_TRACT | Status: AC
  Administered 2016-10-30: 3 mL via RESPIRATORY_TRACT

## 2016-10-30 NOTE — Telephone Encounter (Signed)
[facility]  Patient Name: Linda Buckley  DOB: 06-Jan-1956    Initial Comment Caller saw doctor for shortness of breath and wheezing. The caller is still wheezing and does not want to over use her inhaler.    Nurse Assessment  Nurse: Wayne Sever, RN, Tillie Rung Date/Time (Eastern Time): 10/30/2016 10:26:01 AM  Confirm and document reason for call. If symptomatic, describe symptoms. You must click the next button to save text entered. ---Caller states she is on the way to office to see MD now. Denies needing triage, she states she has appointment in 15 minutes  Has the patient traveled out of the country within the last 30 days? ---Not Applicable  Does the patient have any new or worsening symptoms? ---Yes  Will a triage be completed? ---No  Select reason for no triage. ---Patient declined     Guidelines    Guideline Title Affirmed Question Affirmed Notes       Final Disposition User   Clinical Call Grandy, RN, Tillie Rung

## 2016-10-30 NOTE — Addendum Note (Signed)
Addended by: Leonidas Romberg on: 10/30/2016 11:30 AM   Modules accepted: Orders

## 2016-10-30 NOTE — Progress Notes (Signed)
Patient presents to clinic today for worsening wheezing despite addition of Qvar to her albuterol rescue inhaler. Patient is currently being treated for sinusitis with Amoxicillin. Has been taking as directed without side effect. Endorses continued sinus pressure/pain mostly R sided with PND. Denies ear pressure of pain. Endorses wheezing and SOB with rest or exertion. Denies exposure to second-hand smoke, perfumes or chemicals. Denies chest pain. Denies fever, chills. Notes fatigue.   Past Medical History:  Diagnosis Date  . Anxiety   . Arthritis   . Carpal tunnel syndrome of right wrist    RECURRENT  . Depression   . Environmental allergies    dust mites, pollen, roaches and other insects, mold  . Headache    migraines  . Hereditary and idiopathic peripheral neuropathy 04/03/2016  . History of adenomatous polyp of colon   . History of gastric ulcer   . History of kidney stones   . Hypertension   . Pneumonia   . Wears glasses     Current Outpatient Prescriptions on File Prior to Visit  Medication Sig Dispense Refill  . ALPRAZolam (XANAX) 0.5 MG tablet TAKE 1 TABLET BY MOUTH TWICE DAILY AS NEEDED FOR ANXIETY 60 tablet 0  . amoxicillin (AMOXIL) 875 MG tablet Take 1 tablet (875 mg total) by mouth 2 (two) times daily. 20 tablet 0  . beclomethasone (QVAR) 80 MCG/ACT inhaler Inhale 1 puff into the lungs 2 (two) times daily. 1 Inhaler 3  . buPROPion (WELLBUTRIN XL) 150 MG 24 hr tablet TAKE 1 TABLET(150 MG) BY MOUTH DAILY 30 tablet 3  . Cholecalciferol (VITAMIN D PO) Take 1 tablet by mouth daily.    . clonazePAM (KLONOPIN) 0.5 MG tablet Take 1 tablet (0.5 mg total) by mouth at bedtime. 30 tablet 3  . DULoxetine (CYMBALTA) 30 MG capsule Take 1 capsule (30 mg total) by mouth daily. 30 capsule 3  . DULoxetine (CYMBALTA) 60 MG capsule Take 1 capsule (60 mg total) by mouth daily. 30 capsule 3  . EPINEPHrine (EPIPEN) 0.3 mg/0.3 mL SOAJ injection Inject 0.3 mg into the muscle once.    Marland Kitchen  guaiFENesin-codeine (ROBITUSSIN AC) 100-10 MG/5ML syrup Take 10 mLs by mouth 3 (three) times daily as needed for cough. 240 mL 0  . losartan-hydrochlorothiazide (HYZAAR) 50-12.5 MG tablet TAKE 1 TABLET BY MOUTH EVERY DAY 90 tablet 1  . ondansetron (ZOFRAN-ODT) 4 MG disintegrating tablet Take by mouth.    . oxyCODONE-acetaminophen (PERCOCET) 7.5-325 MG tablet TK 1 T PO BID PRN P  0  . pregabalin (LYRICA) 75 MG capsule One capsule in the morning and two in the evening 90 capsule 5  . ranitidine (ZANTAC) 300 MG tablet TAKE 1 TABLET(300 MG) BY MOUTH AT BEDTIME 30 tablet 6  . VENTOLIN HFA 108 (90 Base) MCG/ACT inhaler INHALE 2 PUFFS BY MOUTH EVERY 4 HOURS AS NEEDED FOR SHORTNESS OF BREATH 36 g 0   No current facility-administered medications on file prior to visit.     Allergies  Allergen Reactions  . Aspirin Swelling    Sweating/ swelling of hands and face   . Bee Venom Swelling    Swelling at site     Family History  Problem Relation Age of Onset  . Asthma Father   . Arthritis Mother   . Hypertension Mother   . Diabetes Mother   . Cancer Mother   . Diabetes Brother   . Diabetes Brother   . Asthma Son   . Cancer Maternal Grandfather  colon  . Colon cancer Maternal Grandfather   . Cancer Maternal Aunt     breast  . Cancer Maternal Uncle     lung  . Colon cancer Maternal Uncle   . Cancer Maternal Grandmother     ovarian  . Diabetes Son     Social History   Social History  . Marital status: Married    Spouse name: N/A  . Number of children: 2  . Years of education: 2 yrs college   Occupational History  . [employer]     Dance movement psychotherapist   Social History Main Topics  . Smoking status: Never Smoker  . Smokeless tobacco: Never Used  . Alcohol use Yes     Comment: 2 glasses of wine weekly  . Drug use: No  . Sexual activity: Not on file   Other Topics Concern  . Not on file   Social History Narrative   Lives at home w/ her husband   Right-handed   Drinks 1-2 cups  of coffee daily and 2 cups of tea per week    Review of Systems - See HPI.  All other ROS are negative.  There were no vitals taken for this visit.  Physical Exam  Constitutional: She is oriented to person, place, and time and well-developed, well-nourished, and in no distress.  HENT:  Head: Normocephalic and atraumatic.  Right Ear: Tympanic membrane normal.  Left Ear: Tympanic membrane normal.  Nose: Mucosal edema and rhinorrhea present. Right sinus exhibits maxillary sinus tenderness.  Mouth/Throat: Uvula is midline, oropharynx is clear and moist and mucous membranes are normal.  Eyes: Conjunctivae are normal.  Neck: Neck supple.  Cardiovascular: Normal rate, regular rhythm, normal heart sounds and intact distal pulses.   Pulmonary/Chest: Effort normal. No respiratory distress. She has wheezes. She has no rales. She exhibits no tenderness.  Neurological: She is alert and oriented to person, place, and time.  Skin: Skin is warm and dry. No rash noted.  Psychiatric: Affect normal.  Vitals reviewed.   Recent Results (from the past 2160 hour(s))  Basic metabolic panel     Status: None   Collection Time: 09/04/16  8:58 AM  Result Value Ref Range   Sodium 143 135 - 145 mEq/L   Potassium 3.8 3.5 - 5.1 mEq/L   Chloride 104 96 - 112 mEq/L   CO2 32 19 - 32 mEq/L   Glucose, Bld 87 70 - 99 mg/dL   BUN 14 6 - 23 mg/dL   Creatinine, Ser 0.99 0.40 - 1.20 mg/dL   Calcium 9.1 8.4 - 10.5 mg/dL   GFR 74.11 >60.00 mL/min  TSH     Status: None   Collection Time: 09/04/16  8:58 AM  Result Value Ref Range   TSH 1.06 0.35 - 4.50 uIU/mL  CBC with Differential/Platelet     Status: None   Collection Time: 09/04/16  8:58 AM  Result Value Ref Range   WBC 4.2 4.0 - 10.5 K/uL   RBC 4.07 3.87 - 5.11 Mil/uL   Hemoglobin 12.4 12.0 - 15.0 g/dL   HCT 37.2 36.0 - 46.0 %   MCV 91.3 78.0 - 100.0 fl   MCHC 33.3 30.0 - 36.0 g/dL   RDW 13.6 11.5 - 15.5 %   Platelets 220.0 150.0 - 400.0 K/uL    Neutrophils Relative % 66.1 43.0 - 77.0 %   Lymphocytes Relative 22.7 12.0 - 46.0 %   Monocytes Relative 7.7 3.0 - 12.0 %   Eosinophils Relative 2.3  0.0 - 5.0 %   Basophils Relative 1.2 0.0 - 3.0 %   Neutro Abs 2.8 1.4 - 7.7 K/uL   Lymphs Abs 1.0 0.7 - 4.0 K/uL   Monocytes Absolute 0.3 0.1 - 1.0 K/uL   Eosinophils Absolute 0.1 0.0 - 0.7 K/uL   Basophils Absolute 0.1 0.0 - 0.1 K/uL  Antinuclear Antib (ANA)     Status: None   Collection Time: 09/04/16  8:58 AM  Result Value Ref Range   Anit Nuclear Antibody(ANA) NEG NEGATIVE  Sed Rate (ESR)     Status: None   Collection Time: 09/04/16  8:58 AM  Result Value Ref Range   Sed Rate 8 0 - 30 mm/hr  Lipid panel     Status: None   Collection Time: 10/28/16  2:43 PM  Result Value Ref Range   Cholesterol 182 0 - 200 mg/dL    Comment: ATP III Classification       Desirable:  < 200 mg/dL               Borderline High:  200 - 239 mg/dL          High:  > = 240 mg/dL   Triglycerides 109.0 0.0 - 149.0 mg/dL    Comment: Normal:  <150 mg/dLBorderline High:  150 - 199 mg/dL   HDL 66.40 >39.00 mg/dL   VLDL 21.8 0.0 - 40.0 mg/dL   LDL Cholesterol 94 0 - 99 mg/dL   Total CHOL/HDL Ratio 3     Comment:                Men          Women1/2 Average Risk     3.4          3.3Average Risk          5.0          4.42X Average Risk          9.6          7.13X Average Risk          15.0          11.0                       NonHDL 115.90     Comment: NOTE:  Non-HDL goal should be 30 mg/dL higher than patient's LDL goal (i.e. LDL goal of < 70 mg/dL, would have non-HDL goal of < 100 mg/dL)  Basic metabolic panel     Status: Abnormal   Collection Time: 10/28/16  2:43 PM  Result Value Ref Range   Sodium 144 135 - 145 mEq/L   Potassium 4.0 3.5 - 5.1 mEq/L   Chloride 105 96 - 112 mEq/L   CO2 32 19 - 32 mEq/L   Glucose, Bld 108 (H) 70 - 99 mg/dL   BUN 19 6 - 23 mg/dL   Creatinine, Ser 0.93 0.40 - 1.20 mg/dL   Calcium 9.5 8.4 - 10.5 mg/dL   GFR 79.62 >60.00 mL/min    TSH     Status: None   Collection Time: 10/28/16  2:43 PM  Result Value Ref Range   TSH 0.77 0.35 - 4.50 uIU/mL  Hepatic function panel     Status: Abnormal   Collection Time: 10/28/16  2:43 PM  Result Value Ref Range   Total Bilirubin 0.4 0.2 - 1.2 mg/dL   Bilirubin, Direct 0.1 0.0 - 0.3 mg/dL   Alkaline Phosphatase 77 39 -  117 U/L   AST 36 0 - 37 U/L   ALT 37 (H) 0 - 35 U/L   Total Protein 6.8 6.0 - 8.3 g/dL   Albumin 4.1 3.5 - 5.2 g/dL  CBC with Differential/Platelet     Status: Abnormal   Collection Time: 10/28/16  2:43 PM  Result Value Ref Range   WBC 3.6 (L) 4.0 - 10.5 K/uL   RBC 4.14 3.87 - 5.11 Mil/uL   Hemoglobin 12.4 12.0 - 15.0 g/dL   HCT 37.3 36.0 - 46.0 %   MCV 90.1 78.0 - 100.0 fl   MCHC 33.3 30.0 - 36.0 g/dL   RDW 13.0 11.5 - 15.5 %   Platelets 225.0 150.0 - 400.0 K/uL   Neutrophils Relative % 62.1 43.0 - 77.0 %   Lymphocytes Relative 23.9 12.0 - 46.0 %   Monocytes Relative 6.1 3.0 - 12.0 %   Eosinophils Relative 7.6 (H) 0.0 - 5.0 %   Basophils Relative 0.3 0.0 - 3.0 %   Neutro Abs 2.3 1.4 - 7.7 K/uL   Lymphs Abs 0.9 0.7 - 4.0 K/uL   Monocytes Absolute 0.2 0.1 - 1.0 K/uL   Eosinophils Absolute 0.3 0.0 - 0.7 K/uL   Basophils Absolute 0.0 0.0 - 0.1 K/uL  VITAMIN D 25 Hydroxy (Vit-D Deficiency, Fractures)     Status: Abnormal   Collection Time: 10/28/16  2:43 PM  Result Value Ref Range   VITD 28.80 (L) 30.00 - 100.00 ng/mL    Assessment/Plan: 1. Recurrent sinusitis Stop amoxicillin. Will start Levaquin 500 mg daily. IM depomedrol given. Continue supportive measures. FU scheduled. - levofloxacin (LEVAQUIN) 500 MG tablet; Take 1 tablet (500 mg total) by mouth daily.  Dispense: 7 tablet; Refill: 0  2. Wheezing Present despite Qvar and albuterol. Duoneb given in office with some improvement. Patient saturating well. Will start prednisone burst tomorrow and Depomedrol given today. Will get CXR to r/o pneumonia. FU scheduled for Friday. Alarm signs/symptoms  reviewed with patient. She is to call 911 or go to ER if anything remotely worsens after starting this regimen.  - predniSONE (DELTASONE) 20 MG tablet; Take 2 tablets (40 mg total) by mouth daily with breakfast.  Dispense: 10 tablet; Refill: 0 - DG Chest 2 View; Future   Leeanne Rio, PA-C

## 2016-10-30 NOTE — Progress Notes (Signed)
Pre visit review using our clinic review tool, if applicable. No additional management support is needed unless otherwise documented below in the visit note. 

## 2016-10-30 NOTE — Patient Instructions (Signed)
Please stop the Amoxicillin and start the Levaquin once daily as directed.  Increase fluid intake.  Start the Use Saline nasal spray.  Take a daily multivitamin. Start the steroid burst tomorrow as directed.  Place a humidifier in the bedroom.  Continue your inhalers as directed. Do not use the albuterol more than as directed.  Go to [facility] on [address] for your chest x-ray. I will call with your results. We will alter regimen if indicated.  If there is any worsening symptoms after starting this regimen, please call 911 or have someone take you to the ER.  Follow-up in office on Friday.   Sinusitis Sinusitis is redness, soreness, and swelling (inflammation) of the paranasal sinuses. Paranasal sinuses are air pockets within the bones of your face (beneath the eyes, the middle of the forehead, or above the eyes). In healthy paranasal sinuses, mucus is able to drain out, and air is able to circulate through them by way of your nose. However, when your paranasal sinuses are inflamed, mucus and air can become trapped. This can allow bacteria and other germs to grow and cause infection. Sinusitis can develop quickly and last only a short time (acute) or continue over a long period (chronic). Sinusitis that lasts for more than 12 weeks is considered chronic.  CAUSES  Causes of sinusitis include:  Allergies.  Structural abnormalities, such as displacement of the cartilage that separates your nostrils (deviated septum), which can decrease the air flow through your nose and sinuses and affect sinus drainage.  Functional abnormalities, such as when the small hairs (cilia) that line your sinuses and help remove mucus do not work properly or are not present. SYMPTOMS  Symptoms of acute and chronic sinusitis are the same. The primary symptoms are pain and pressure around the affected sinuses. Other symptoms include:  Upper toothache.  Earache.  Headache.  Bad breath.  Decreased sense of  smell and taste.  A cough, which worsens when you are lying flat.  Fatigue.  Fever.  Thick drainage from your nose, which often is green and may contain pus (purulent).  Swelling and warmth over the affected sinuses. DIAGNOSIS  Your caregiver will perform a physical exam. During the exam, your caregiver may:  Look in your nose for signs of abnormal growths in your nostrils (nasal polyps).  Tap over the affected sinus to check for signs of infection.  View the inside of your sinuses (endoscopy) with a special imaging device with a light attached (endoscope), which is inserted into your sinuses. If your caregiver suspects that you have chronic sinusitis, one or more of the following tests may be recommended:  Allergy tests.  Nasal culture A sample of mucus is taken from your nose and sent to a lab and screened for bacteria.  Nasal cytology A sample of mucus is taken from your nose and examined by your caregiver to determine if your sinusitis is related to an allergy. TREATMENT  Most cases of acute sinusitis are related to a viral infection and will resolve on their own within 10 days. Sometimes medicines are prescribed to help relieve symptoms (pain medicine, decongestants, nasal steroid sprays, or saline sprays).  However, for sinusitis related to a bacterial infection, your caregiver will prescribe antibiotic medicines. These are medicines that will help kill the bacteria causing the infection.  Rarely, sinusitis is caused by a fungal infection. In theses cases, your caregiver will prescribe antifungal medicine. For some cases of chronic sinusitis, surgery is needed. Generally, these are cases  in which sinusitis recurs more than 3 times per year, despite other treatments. HOME CARE INSTRUCTIONS   Drink plenty of water. Water helps thin the mucus so your sinuses can drain more easily.  Use a humidifier.  Inhale steam 3 to 4 times a day (for example, sit in the bathroom with the  shower running).  Apply a warm, moist washcloth to your face 3 to 4 times a day, or as directed by your caregiver.  Use saline nasal sprays to help moisten and clean your sinuses.  Take over-the-counter or prescription medicines for pain, discomfort, or fever only as directed by your caregiver. SEEK IMMEDIATE MEDICAL CARE IF:  You have increasing pain or severe headaches.  You have nausea, vomiting, or drowsiness.  You have swelling around your face.  You have vision problems.  You have a stiff neck.  You have difficulty breathing. MAKE SURE YOU:   Understand these instructions.  Will watch your condition.  Will get help right away if you are not doing well or get worse. Document Released: 12/02/2005 Document Revised: 02/24/2012 Document Reviewed: 12/17/2011 [facility] Patient Information 2014 [place], [place].

## 2016-11-01 ENCOUNTER — Ambulatory Visit (INDEPENDENT_AMBULATORY_CARE_PROVIDER_SITE_OTHER): Payer: BLUE CROSS/BLUE SHIELD | Admitting: Physician Assistant

## 2016-11-01 ENCOUNTER — Encounter: Payer: Self-pay | Admitting: Physician Assistant

## 2016-11-01 VITALS — BP 118/80 | HR 85 | Temp 98.2°F | Resp 16 | Ht 66.0 in | Wt 244.0 lb

## 2016-11-01 DIAGNOSIS — B9689 Other specified bacterial agents as the cause of diseases classified elsewhere: Secondary | ICD-10-CM | POA: Diagnosis not present

## 2016-11-01 DIAGNOSIS — J019 Acute sinusitis, unspecified: Secondary | ICD-10-CM

## 2016-11-01 NOTE — Progress Notes (Signed)
Patient presents to clinic today for follow-up of sinusitis and reactive airway disease. Patient is taking steroid burst and Levaquin as directed. Denies side effect of medication. Notes marked improvement in breathing. Denies residual wheeze. Has been able to stop rescue inhaler. Is taking Qvar as directed. Notes improving sinus pressure/pain. Denies new or worsening symptoms.   Past Medical History:  Diagnosis Date  . Anxiety   . Arthritis   . Carpal tunnel syndrome of right wrist    RECURRENT  . Depression   . Environmental allergies    dust mites, pollen, roaches and other insects, mold  . Headache    migraines  . Hereditary and idiopathic peripheral neuropathy 04/03/2016  . History of adenomatous polyp of colon   . History of gastric ulcer   . History of kidney stones   . Hypertension   . Pneumonia   . Wears glasses     Current Outpatient Prescriptions on File Prior to Visit  Medication Sig Dispense Refill  . ALPRAZolam (XANAX) 0.5 MG tablet TAKE 1 TABLET BY MOUTH TWICE DAILY AS NEEDED FOR ANXIETY 60 tablet 0  . beclomethasone (QVAR) 80 MCG/ACT inhaler Inhale 1 puff into the lungs 2 (two) times daily. 1 Inhaler 3  . buPROPion (WELLBUTRIN XL) 150 MG 24 hr tablet TAKE 1 TABLET(150 MG) BY MOUTH DAILY 30 tablet 3  . Cholecalciferol (VITAMIN D PO) Take 1 tablet by mouth daily.    . clonazePAM (KLONOPIN) 0.5 MG tablet Take 1 tablet (0.5 mg total) by mouth at bedtime. 30 tablet 3  . DULoxetine (CYMBALTA) 30 MG capsule Take 1 capsule (30 mg total) by mouth daily. 30 capsule 3  . DULoxetine (CYMBALTA) 60 MG capsule Take 1 capsule (60 mg total) by mouth daily. 30 capsule 3  . EPINEPHrine (EPIPEN) 0.3 mg/0.3 mL SOAJ injection Inject 0.3 mg into the muscle once.    Marland Kitchen guaiFENesin-codeine (ROBITUSSIN AC) 100-10 MG/5ML syrup Take 10 mLs by mouth 3 (three) times daily as needed for cough. 240 mL 0  . levofloxacin (LEVAQUIN) 500 MG tablet Take 1 tablet (500 mg total) by mouth daily. 7 tablet  0  . losartan-hydrochlorothiazide (HYZAAR) 50-12.5 MG tablet TAKE 1 TABLET BY MOUTH EVERY DAY 90 tablet 1  . ondansetron (ZOFRAN-ODT) 4 MG disintegrating tablet Take by mouth.    . oxyCODONE-acetaminophen (PERCOCET) 7.5-325 MG tablet TK 1 T PO BID PRN P  0  . predniSONE (DELTASONE) 20 MG tablet Take 2 tablets (40 mg total) by mouth daily with breakfast. 10 tablet 0  . pregabalin (LYRICA) 75 MG capsule One capsule in the morning and two in the evening 90 capsule 5  . ranitidine (ZANTAC) 300 MG tablet TAKE 1 TABLET(300 MG) BY MOUTH AT BEDTIME 30 tablet 6  . VENTOLIN HFA 108 (90 Base) MCG/ACT inhaler INHALE 2 PUFFS BY MOUTH EVERY 4 HOURS AS NEEDED FOR SHORTNESS OF BREATH 36 g 0  . Vitamin D, Ergocalciferol, (DRISDOL) 50000 units CAPS capsule Take 1 capsule (50,000 Units total) by mouth every 7 (seven) days. 12 capsule 0   No current facility-administered medications on file prior to visit.     Allergies  Allergen Reactions  . Aspirin Swelling    Sweating/ swelling of hands and face   . Bee Venom Swelling    Swelling at site     Family History  Problem Relation Age of Onset  . Asthma Father   . Arthritis Mother   . Hypertension Mother   . Diabetes Mother   .  Cancer Mother   . Diabetes Brother   . Diabetes Brother   . Asthma Son   . Cancer Maternal Grandfather     colon  . Colon cancer Maternal Grandfather   . Cancer Maternal Aunt     breast  . Cancer Maternal Uncle     lung  . Colon cancer Maternal Uncle   . Cancer Maternal Grandmother     ovarian  . Diabetes Son     Social History   Social History  . Marital status: Married    Spouse name: N/A  . Number of children: 2  . Years of education: 2 yrs college   Occupational History  . [employer]     Dance movement psychotherapist   Social History Main Topics  . Smoking status: Never Smoker  . Smokeless tobacco: Never Used  . Alcohol use Yes     Comment: 2 glasses of wine weekly  . Drug use: No  . Sexual activity: Not Asked    Other Topics Concern  . None   Social History Narrative   Lives at home w/ her husband   Right-handed   Drinks 1-2 cups of coffee daily and 2 cups of tea per week    Review of Systems - See HPI.  All other ROS are negative.  There were no vitals taken for this visit.  Physical Exam  Constitutional: She is oriented to person, place, and time and well-developed, well-nourished, and in no distress.  HENT:  Head: Normocephalic and atraumatic.  Right Ear: External ear normal.  Left Ear: External ear normal.  Mouth/Throat: Oropharynx is clear and moist.  Cardiovascular: Normal rate, regular rhythm, normal heart sounds and intact distal pulses.   Pulmonary/Chest: Effort normal and breath sounds normal. No respiratory distress. She has no wheezes. She has no rales. She exhibits no tenderness.  Neurological: She is alert and oriented to person, place, and time.  Skin: Skin is warm and dry. No rash noted.  Psychiatric: Affect normal.  Vitals reviewed.  Recent Results (from the past 2160 hour(s))  Basic metabolic panel     Status: None   Collection Time: 09/04/16  8:58 AM  Result Value Ref Range   Sodium 143 135 - 145 mEq/L   Potassium 3.8 3.5 - 5.1 mEq/L   Chloride 104 96 - 112 mEq/L   CO2 32 19 - 32 mEq/L   Glucose, Bld 87 70 - 99 mg/dL   BUN 14 6 - 23 mg/dL   Creatinine, Ser 0.99 0.40 - 1.20 mg/dL   Calcium 9.1 8.4 - 10.5 mg/dL   GFR 74.11 >60.00 mL/min  TSH     Status: None   Collection Time: 09/04/16  8:58 AM  Result Value Ref Range   TSH 1.06 0.35 - 4.50 uIU/mL  CBC with Differential/Platelet     Status: None   Collection Time: 09/04/16  8:58 AM  Result Value Ref Range   WBC 4.2 4.0 - 10.5 K/uL   RBC 4.07 3.87 - 5.11 Mil/uL   Hemoglobin 12.4 12.0 - 15.0 g/dL   HCT 37.2 36.0 - 46.0 %   MCV 91.3 78.0 - 100.0 fl   MCHC 33.3 30.0 - 36.0 g/dL   RDW 13.6 11.5 - 15.5 %   Platelets 220.0 150.0 - 400.0 K/uL   Neutrophils Relative % 66.1 43.0 - 77.0 %   Lymphocytes  Relative 22.7 12.0 - 46.0 %   Monocytes Relative 7.7 3.0 - 12.0 %   Eosinophils Relative 2.3 0.0 -  5.0 %   Basophils Relative 1.2 0.0 - 3.0 %   Neutro Abs 2.8 1.4 - 7.7 K/uL   Lymphs Abs 1.0 0.7 - 4.0 K/uL   Monocytes Absolute 0.3 0.1 - 1.0 K/uL   Eosinophils Absolute 0.1 0.0 - 0.7 K/uL   Basophils Absolute 0.1 0.0 - 0.1 K/uL  Antinuclear Antib (ANA)     Status: None   Collection Time: 09/04/16  8:58 AM  Result Value Ref Range   Anit Nuclear Antibody(ANA) NEG NEGATIVE  Sed Rate (ESR)     Status: None   Collection Time: 09/04/16  8:58 AM  Result Value Ref Range   Sed Rate 8 0 - 30 mm/hr  Lipid panel     Status: None   Collection Time: 10/28/16  2:43 PM  Result Value Ref Range   Cholesterol 182 0 - 200 mg/dL    Comment: ATP III Classification       Desirable:  < 200 mg/dL               Borderline High:  200 - 239 mg/dL          High:  > = 240 mg/dL   Triglycerides 109.0 0.0 - 149.0 mg/dL    Comment: Normal:  <150 mg/dLBorderline High:  150 - 199 mg/dL   HDL 66.40 >39.00 mg/dL   VLDL 21.8 0.0 - 40.0 mg/dL   LDL Cholesterol 94 0 - 99 mg/dL   Total CHOL/HDL Ratio 3     Comment:                Men          Women1/2 Average Risk     3.4          3.3Average Risk          5.0          4.42X Average Risk          9.6          7.13X Average Risk          15.0          11.0                       NonHDL 115.90     Comment: NOTE:  Non-HDL goal should be 30 mg/dL higher than patient's LDL goal (i.e. LDL goal of < 70 mg/dL, would have non-HDL goal of < 100 mg/dL)  Basic metabolic panel     Status: Abnormal   Collection Time: 10/28/16  2:43 PM  Result Value Ref Range   Sodium 144 135 - 145 mEq/L   Potassium 4.0 3.5 - 5.1 mEq/L   Chloride 105 96 - 112 mEq/L   CO2 32 19 - 32 mEq/L   Glucose, Bld 108 (H) 70 - 99 mg/dL   BUN 19 6 - 23 mg/dL   Creatinine, Ser 0.93 0.40 - 1.20 mg/dL   Calcium 9.5 8.4 - 10.5 mg/dL   GFR 79.62 >60.00 mL/min  TSH     Status: None   Collection Time: 10/28/16  2:43  PM  Result Value Ref Range   TSH 0.77 0.35 - 4.50 uIU/mL  Hepatic function panel     Status: Abnormal   Collection Time: 10/28/16  2:43 PM  Result Value Ref Range   Total Bilirubin 0.4 0.2 - 1.2 mg/dL   Bilirubin, Direct 0.1 0.0 - 0.3 mg/dL   Alkaline Phosphatase 77 39 - 117 U/L  AST 36 0 - 37 U/L   ALT 37 (H) 0 - 35 U/L   Total Protein 6.8 6.0 - 8.3 g/dL   Albumin 4.1 3.5 - 5.2 g/dL  CBC with Differential/Platelet     Status: Abnormal   Collection Time: 10/28/16  2:43 PM  Result Value Ref Range   WBC 3.6 (L) 4.0 - 10.5 K/uL   RBC 4.14 3.87 - 5.11 Mil/uL   Hemoglobin 12.4 12.0 - 15.0 g/dL   HCT 37.3 36.0 - 46.0 %   MCV 90.1 78.0 - 100.0 fl   MCHC 33.3 30.0 - 36.0 g/dL   RDW 13.0 11.5 - 15.5 %   Platelets 225.0 150.0 - 400.0 K/uL   Neutrophils Relative % 62.1 43.0 - 77.0 %   Lymphocytes Relative 23.9 12.0 - 46.0 %   Monocytes Relative 6.1 3.0 - 12.0 %   Eosinophils Relative 7.6 (H) 0.0 - 5.0 %   Basophils Relative 0.3 0.0 - 3.0 %   Neutro Abs 2.3 1.4 - 7.7 K/uL   Lymphs Abs 0.9 0.7 - 4.0 K/uL   Monocytes Absolute 0.2 0.1 - 1.0 K/uL   Eosinophils Absolute 0.3 0.0 - 0.7 K/uL   Basophils Absolute 0.0 0.0 - 0.1 K/uL  VITAMIN D 25 Hydroxy (Vit-D Deficiency, Fractures)     Status: Abnormal   Collection Time: 10/28/16  2:43 PM  Result Value Ref Range   VITD 28.80 (L) 30.00 - 100.00 ng/mL   Assessment/Plan: 1. Acute bacterial sinusitis Symptoms improving. Wheezing resolved. Continue care discussed at last visit. FU if symptoms are not completely resolving.   Leeanne Rio, PA-C

## 2016-11-01 NOTE — Progress Notes (Signed)
Pre visit review using our clinic review tool, if applicable. No additional management support is needed unless otherwise documented below in the visit note. 

## 2016-11-01 NOTE — Patient Instructions (Signed)
Please complete entire course of steroid and antibiotic.  Continue hydrating and resting.  Continue supportive measures discussed at last visit.  Follow-up if symptoms are not completely resolving or if anything new develops.

## 2016-11-03 ENCOUNTER — Ambulatory Visit (INDEPENDENT_AMBULATORY_CARE_PROVIDER_SITE_OTHER): Payer: BLUE CROSS/BLUE SHIELD | Admitting: Neurology

## 2016-11-03 DIAGNOSIS — G8929 Other chronic pain: Secondary | ICD-10-CM

## 2016-11-03 DIAGNOSIS — R0683 Snoring: Secondary | ICD-10-CM

## 2016-11-03 DIAGNOSIS — G4711 Idiopathic hypersomnia with long sleep time: Secondary | ICD-10-CM

## 2016-11-03 DIAGNOSIS — G4733 Obstructive sleep apnea (adult) (pediatric): Secondary | ICD-10-CM

## 2016-11-03 DIAGNOSIS — M25562 Pain in left knee: Secondary | ICD-10-CM

## 2016-11-03 DIAGNOSIS — M25561 Pain in right knee: Secondary | ICD-10-CM

## 2016-11-03 DIAGNOSIS — I10 Essential (primary) hypertension: Secondary | ICD-10-CM

## 2016-11-14 ENCOUNTER — Telehealth: Payer: Self-pay | Admitting: Neurology

## 2016-11-14 ENCOUNTER — Telehealth: Payer: Self-pay

## 2016-11-14 DIAGNOSIS — G4733 Obstructive sleep apnea (adult) (pediatric): Secondary | ICD-10-CM

## 2016-11-14 NOTE — Procedures (Signed)
PATIENT'S NAME:  Linda Buckley, Linda Buckley DOB:      June 18, 1956      MR#:    [id number]     DATE OF RECORDING: 11/03/2016 REFERRING M.D.:  Dr. Birdie Riddle Study Performed:  Split-Night Study HISTORY:  Linda Buckley reports that she his snoring, wheezing, has gained weight, has swelling in the lower extremities, a tendency to bruise easily, feels excessively sleepy, has restless legs, depression and anxiety, slurring of speech, headaches, confusion. She endorsed back surgeries (had anterior fusion cervical spine 12/04/2015), carpal tunnel. She is morbidly obese. She was told that she snored and has paused in her breathing.  The patient endorsed the Epworth Sleepiness Scale at 20 points, Fatigue scale at 63.  The patient's weight 246 pounds with a height of 66 (inches), resulting in a BMI of 39.7 kg/m2.The patient's neck circumference measured 14 inches.  CURRENT MEDICATIONS: Xanax, Wellbutrin, Vitamin D, Klonopin, Cymbalta, Epipen, Hyzaar, Zofron-ODT, Percocet, Lyrica, Zantac, Ventolin HFA   PROCEDURE:  This is a multichannel digital polysomnogram utilizing the Somnostar 11.2 system.  Electrodes and sensors were applied and monitored per AASM Specifications.   EEG, EOG, Chin and Limb EMG, were sampled at 200 Hz.  ECG, Snore and Nasal Pressure, Thermal Airflow, Respiratory Effort, CPAP Flow and Pressure, Oximetry was sampled at 50 Hz. Digital video and audio were recorded.      BASELINE STUDY WITHOUT CPAP RESULTS:  Lights Out was at 20:59 and Lights On at 04:54.  Total recording time (TRT) was 217.5, with a total sleep time (TST) of 127 minutes.   The patient's sleep latency was 88.5 minutes.  The sleep efficiency was 58.4 %.    SLEEP ARCHITECTURE: WASO (Wake after sleep onset) was 41.5 minutes, Stage N1 was 4 minutes, Stage N2 was 69.5 minutes, Stage N3 was 53.5 minutes and Stage R (REM sleep) was 0 minutes.   The percentages were Stage N1 3.1%, Stage N2 54.7%, Stage N3 42.1% and Stage R (REM sleep) 0%.    RESPIRATORY ANALYSIS:  There were a total of 54 respiratory events:  0 obstructive apneas, 0 central apneas and 0 mixed apneas with a total of 0 apneas and an apnea index (AI) of 0. There were 54 hypopneas with a hypopnea index of 25.5. The patient also had 0 respiratory event related arousals (RERAs).  Snoring was noted. The total APNEA/HYPOPNEA INDEX (AHI) was 25.5 /hour and the total RESPIRATORY DISTURBANCE INDEX was 25.5 /hour.  The REM AHI was 0, /hour versus a non-REM AHI of 25.5 /hour. The patient spent 338 minutes sleep time in the supine position 0 minutes in non-supine.  OXYGEN SATURATION & C02:  The wake baseline 02 saturation was 91%, with the lowest being 89%. Time spent below 89% saturation equaled 0 minutes.  PERIODIC LIMB MOVEMENTS:    The patient had a total of 0 Periodic Limb Movements.   Snoring was noted EKG was in keeping with normal sinus rhythm (NSR)    TITRATION STUDY WITH CPAP RESULTS:   CPAP was initiated at 5 cmH20 with heated humidity per AASM split night standards and pressure was advanced to 6 cmH20 because of hypopneas, apneas and desaturations.  At a PAP pressure of 60 cmH20, there was a reduction of the AHI to 0.0 /hour.   Total recording time (TRT) was 258 minutes, with a total sleep time (TST) of 211 minutes. The patient's sleep latency was 25 minutes. The sleep efficiency was 81.8 %.    SLEEP ARCHITECTURE: Wake after sleep was 30 minutes, Stage  N1 13 minutes, Stage N2 166 minutes, Stage N3 32 minutes and Stage R (REM sleep) 0 minutes. The percentages were: Stage N1 6.2%, Stage N2 78.7%, Stage N3 15.2% and Stage R (REM sleep) 0%. Sleep architecture remained void of REM sleep.  RESPIRATORY ANALYSIS:  There were a total of 0 respiratory events. The patient also had 0 respiratory event related arousals (RERAs).     The total APNEA/HYPOPNEA INDEX (AHI) was 0 /hour and the total RESPIRATORY DISTURBANCE INDEX was 0 /hour.  The patient spent 100% of total sleep  time in the supine position.  OXYGEN SATURATION & C02:  The wake baseline 02 saturation was 93%, with the lowest being 89%. Time spent below 89% saturation equaled 0 minutes.  PERIODIC LIMB MOVEMENTS:  The patient had a total of 0 Periodic Limb Movements.   POLYSOMNOGRAPHY IMPRESSION :   The patient has mild to moderate sleep apnea with hypoxemia, corrected at 6 cm water CPAP. Mild snoring was corrected as well.   RECOMMENDATIONS:  1. Advise to start CPAP with heated humidification at 6 cmH2O with a SIMPLUS FFM in small size, heated hose, and follow clinical symptomatology.  A dream station or air sense machine is recommended.  2. Advise to use heated humidity.  Adjust interface and heated humidity as needed.     3. Compliance to PAP therapy should be emphasized.  Compliane, AHI and air leak information to be downloaded for objective assessment at 30 days, 180 days and annually thereafter.   4. Further information regarding OSA may be obtained from [employer] (www.sleepfoundation.org) or American Sleep Apnea Association (www.sleepapnea.org). 5. Avoid caffeine-containing beverages and chocolate. Aggressive weight loss by diet and w exercise is strongly recommended.  6. Consider dedicated sleep psychology referral if insomnia remains of clinical concern.   7. A follow up appointment will be scheduled in the Sleep Clinic at [facility].

## 2016-11-14 NOTE — Telephone Encounter (Signed)
I spoke to pt. I advised her that Dr. Brett Fairy saw sleep apnea in her study and the treatment needs a cpap. Pt is agreeable to starting a cpap. I advised pt that I will send the order for a cpap to a DME and the DME will call the pt within a week to get her cpap set up. Pt is agreeable to a follow up appt on 01/20/17 at 2:00 with Dr. Brett Fairy to discuss cpap. Pt verbalized understanding of results. Pt had no questions at this time but was encouraged to call back if questions arise.

## 2016-11-14 NOTE — Telephone Encounter (Signed)
-----   Message from Larey Seat, MD sent at 11/14/2016  2:44 PM EST ----- OSA was diagnosed. Patient will need CPAP at 6 cm water. A full face mask was used in the study, this can be changed if uncomfortable. Rv after 30-60 days of use. CD

## 2016-11-14 NOTE — Telephone Encounter (Signed)
See other telephone call from 11/14/16 regarding sleep study results.

## 2016-11-30 ENCOUNTER — Other Ambulatory Visit: Payer: Self-pay | Admitting: Family Medicine

## 2016-12-02 ENCOUNTER — Ambulatory Visit (INDEPENDENT_AMBULATORY_CARE_PROVIDER_SITE_OTHER): Payer: BLUE CROSS/BLUE SHIELD | Admitting: Neurology

## 2016-12-02 ENCOUNTER — Encounter: Payer: Self-pay | Admitting: Neurology

## 2016-12-02 ENCOUNTER — Telehealth: Payer: Self-pay | Admitting: Neurology

## 2016-12-02 VITALS — BP 113/80 | HR 78 | Ht 66.5 in | Wt 247.5 lb

## 2016-12-02 DIAGNOSIS — M25521 Pain in right elbow: Secondary | ICD-10-CM

## 2016-12-02 DIAGNOSIS — M797 Fibromyalgia: Secondary | ICD-10-CM

## 2016-12-02 MED ORDER — PREDNISONE 5 MG PO TABS: ORAL_TABLET | ORAL | 0 refills | Status: DC

## 2016-12-02 NOTE — Telephone Encounter (Signed)
Returned pt TC. Reports that she's been having worsening pain for several weeks in bilateral upper extremities. She's had injections in her elbow w/o benefit. Work-in appt scheduled for this afternoon. Pt said that she would arrive @ 3 p.m.

## 2016-12-02 NOTE — Progress Notes (Signed)
Reason for visit: Fibromyalgia  Linda Buckley is an 60 y.o. female  History of present illness:  Linda Buckley is a 60 year old right-handed black female with a history of fibromyalgia. The patient has been seen by Dr. Caralyn Guile and she has undergone right carpal tunnel syndrome surgery in the past. The patient has developed severe pain on the lateral aspect of her elbow associated with movement across the elbow. The pain is much better when the arm is resting. The patient also has her usual fibromyalgia pain across the shoulders and upper arms, and in the back and thighs. The patient has a lot of pain with any physical activity, she has also developed some similar pain now within the last week or 2 on the lateral aspect of her left elbow. The patient has gotten injections from Dr. Caralyn Guile, but he has indicated that he cannot help her any further, he has referred her to a neurologist in the [place] area. The patient has already been followed here and comes in for an evaluation. The patient also reports a headache that developed within the last 2 or 3 weeks that are in the bifrontal areas associated with some visual blurring. The patient denies any sinus drainage or other allergy symptoms.  Past Medical History:  Diagnosis Date  . Anxiety   . Arthritis   . Carpal tunnel syndrome of right wrist    RECURRENT  . Depression   . Environmental allergies    dust mites, pollen, roaches and other insects, mold  . Headache    migraines  . Hereditary and idiopathic peripheral neuropathy 04/03/2016  . History of adenomatous polyp of colon   . History of gastric ulcer   . History of kidney stones   . Hypertension   . Pneumonia   . Wears glasses     Past Surgical History:  Procedure Laterality Date  . CARPAL TUNNEL RELEASE Right 2007  . CARPAL TUNNEL RELEASE Right 07/05/2015   Procedure: RIGHT HAND REVISION CARPAL TUNNEL RELEASE;  Surgeon: Iran Planas, MD;  Location: [facility];  Service:  Orthopedics;  Laterality: Right;  . CARPAL TUNNEL RELEASE Left 09/2016  . CERVICAL DISC ARTHROPLASTY  03/2015  . COLONOSCOPY W/ POLYPECTOMY  04-15-2014  . CYSTO/  RIGHT RETROGRADE PYELOGRAM/ URETEROSCOPY STONE EXTRACTION/  STENT PLACEMENT  06-07-2010  . LAPAROSCOPIC ASSISTED VAGINAL HYSTERECTOMY  04-25-2004   ovaries remain  . TUBAL LIGATION  1982    Family History  Problem Relation Age of Onset  . Asthma Father   . Arthritis Mother   . Hypertension Mother   . Diabetes Mother   . Cancer Mother   . Diabetes Brother   . Diabetes Brother   . Asthma Son   . Cancer Maternal Grandfather     colon  . Colon cancer Maternal Grandfather   . Cancer Maternal Aunt     breast  . Cancer Maternal Uncle     lung  . Colon cancer Maternal Uncle   . Cancer Maternal Grandmother     ovarian  . Diabetes Son     Social history:  reports that she has never smoked. She has never used smokeless tobacco. She reports that she drinks alcohol. She reports that she does not use drugs.    Allergies  Allergen Reactions  . Aspirin Swelling    Sweating/ swelling of hands and face   . Bee Venom Swelling    Swelling at site     Medications:  Prior to Admission medications  Medication Sig Start Date End Date Taking? Authorizing Provider  ALPRAZolam Duanne Moron) 0.5 MG tablet TAKE 1 TABLET BY MOUTH TWICE DAILY AS NEEDED FOR ANXIETY 12/02/16  Yes Midge Minium, MD  beclomethasone (QVAR) 80 MCG/ACT inhaler Inhale 1 puff into the lungs 2 (two) times daily. 10/21/16  Yes Midge Minium, MD  buPROPion (WELLBUTRIN XL) 150 MG 24 hr tablet TAKE 1 TABLET(150 MG) BY MOUTH DAILY 10/21/16  Yes Midge Minium, MD  Cholecalciferol (VITAMIN D PO) Take 1 tablet by mouth daily.   Yes Historical Provider, MD  clonazePAM (KLONOPIN) 0.5 MG tablet Take 1 tablet (0.5 mg total) by mouth at bedtime. 10/08/16  Yes Midge Minium, MD  DULoxetine (CYMBALTA) 30 MG capsule Take 1 capsule (30 mg total) by mouth daily.  09/12/16  Yes Kathrynn Ducking, MD  DULoxetine (CYMBALTA) 60 MG capsule Take 1 capsule (60 mg total) by mouth daily. 09/11/16  Yes Midge Minium, MD  losartan-hydrochlorothiazide (HYZAAR) 50-12.5 MG tablet TAKE 1 TABLET BY MOUTH EVERY DAY 08/26/16  Yes Midge Minium, MD  ondansetron (ZOFRAN-ODT) 4 MG disintegrating tablet Take by mouth.   Yes Historical Provider, MD  oxyCODONE-acetaminophen (PERCOCET) 7.5-325 MG tablet TK 1 T PO BID PRN P 08/16/16  Yes Historical Provider, MD  pregabalin (LYRICA) 75 MG capsule One capsule in the morning and two in the evening 08/02/16  Yes Kathrynn Ducking, MD  ranitidine (ZANTAC) 300 MG tablet TAKE 1 TABLET(300 MG) BY MOUTH AT BEDTIME 06/26/16  Yes Midge Minium, MD  VENTOLIN HFA 108 (90 Base) MCG/ACT inhaler INHALE 2 PUFFS BY MOUTH EVERY 4 HOURS AS NEEDED FOR SHORTNESS OF BREATH 10/07/16  Yes Midge Minium, MD  EPINEPHrine (EPIPEN) 0.3 mg/0.3 mL SOAJ injection Inject 0.3 mg into the muscle once.    Historical Provider, MD    ROS:  Out of a complete 14 system review of symptoms, the patient complains only of the following symptoms, and all other reviewed systems are negative.  Fatigue Double vision, blurred vision Shortness of breath Leg swelling Restless legs, daytime sleepiness, sleep talking, acting out dreams Joint pain, back pain, achy muscles, walking difficulty, neck pain Memory loss, dizziness, headache, numbness, weakness Agitation, confusion, depression, anxiety  Blood pressure 113/80, pulse 78, height 5' 6.5" (1.689 m), weight 247 lb 8 oz (112.3 kg).  Physical Exam  General: The patient is alert and cooperative at the time of the examination. The patient is moderately to markedly obese.  Neuromuscular: The patient does have some point tenderness on the lateral epicondyles of both elbows, right greater than left.  Skin: No significant peripheral edema is noted.   Neurologic Exam  Mental status: The patient is alert and  oriented x 3 at the time of the examination. The patient has apparent normal recent and remote memory, with an apparently normal attention span and concentration ability.   Cranial nerves: Facial symmetry is present. Speech is normal, no aphasia or dysarthria is noted. Extraocular movements are full. Visual fields are full.  Motor: The patient has good strength in all 4 extremities.  Sensory examination: Soft touch sensation is symmetric on the face, arms, and legs.  Coordination: The patient has good finger-nose-finger and heel-to-shin bilaterally.  Gait and station: The patient has a slow, delivered and slightly wide-based gait. The patient usually walks with a cane. Romberg is negative. No drift is seen.  Reflexes: Deep tendon reflexes are symmetric.   Assessment/Plan:  1. Fibromyalgia  2. Bifrontal headache  3. Bilateral  elbow pain, right greater than left, possible lateral epicondylitis  The patient appears to have significant elbow discomfort that is most consistent with a tendinitis issue, not with a neuropathy. The pain improves significantly when the arm is at rest, and comes on with use of the arm. The patient will be referred to Dr. Fredna Dow for a second opinion regarding this issue. The patient otherwise will follow-up in 5 or 6 months. A prescription was given for prednisone Dosepak, 5 mg 6 day pack for the headache.  Jill Alexanders MD 12/02/2016 4:33 PM  [facility] [address] [place], [place] [address]  Phone [phone] Fax [phone]

## 2016-12-02 NOTE — Telephone Encounter (Signed)
Pt is having severe pain in bil shoulders down the arm to the hands, hands are numb, bil elbows are stiff. She's had surgery for cts,orthopaedic provider said there symptoms are not related.

## 2016-12-02 NOTE — Telephone Encounter (Signed)
Medication filled to pharmacy as requested.   

## 2016-12-02 NOTE — Telephone Encounter (Signed)
Last OV 10/28/16 Alprazolam last filled 10/21/16 #60 with 0

## 2016-12-06 ENCOUNTER — Encounter (HOSPITAL_BASED_OUTPATIENT_CLINIC_OR_DEPARTMENT_OTHER): Payer: Self-pay

## 2016-12-06 ENCOUNTER — Emergency Department (HOSPITAL_BASED_OUTPATIENT_CLINIC_OR_DEPARTMENT_OTHER)
Admission: EM | Admit: 2016-12-06 | Discharge: 2016-12-06 | Disposition: A | Discharge status: Home / Self Care | Payer: BLUE CROSS/BLUE SHIELD | Attending: Emergency Medicine | Admitting: Emergency Medicine

## 2016-12-06 ENCOUNTER — Emergency Department (HOSPITAL_BASED_OUTPATIENT_CLINIC_OR_DEPARTMENT_OTHER): Payer: BLUE CROSS/BLUE SHIELD

## 2016-12-06 DIAGNOSIS — I10 Essential (primary) hypertension: Secondary | ICD-10-CM | POA: Diagnosis not present

## 2016-12-06 DIAGNOSIS — S8391XA Sprain of unspecified site of right knee, initial encounter: Secondary | ICD-10-CM | POA: Diagnosis not present

## 2016-12-06 DIAGNOSIS — S8991XA Unspecified injury of right lower leg, initial encounter: Secondary | ICD-10-CM

## 2016-12-06 DIAGNOSIS — M25461 Effusion, right knee: Secondary | ICD-10-CM | POA: Diagnosis not present

## 2016-12-06 DIAGNOSIS — Y929 Unspecified place or not applicable: Secondary | ICD-10-CM | POA: Insufficient documentation

## 2016-12-06 DIAGNOSIS — W1839XA Other fall on same level, initial encounter: Secondary | ICD-10-CM | POA: Diagnosis not present

## 2016-12-06 DIAGNOSIS — Z79899 Other long term (current) drug therapy: Secondary | ICD-10-CM | POA: Insufficient documentation

## 2016-12-06 DIAGNOSIS — Y939 Activity, unspecified: Secondary | ICD-10-CM | POA: Insufficient documentation

## 2016-12-06 DIAGNOSIS — Y999 Unspecified external cause status: Secondary | ICD-10-CM | POA: Diagnosis not present

## 2016-12-06 MED ORDER — HYDROCODONE-ACETAMINOPHEN 5-325 MG PO TABS: 2.0000 | ORAL_TABLET | ORAL | 0 refills | Status: DC | PRN

## 2016-12-06 MED FILL — HYDROCODON-APAP 5-325: 5-325 | 2 days supply | Qty: 12 | Fill #0

## 2016-12-06 NOTE — ED Provider Notes (Signed)
[facility] DEPT [facility] Provider Note   CSN: [id number] Arrival date & time: 12/06/16  1528 By signing my name below, I, Linda Buckley, attest that this documentation has been prepared under the direction and in the presence of Caryl Ada, [place]. Electronically Signed: Georgette Buckley, ED Scribe. 12/06/16. 5:00 PM.  History   Chief Complaint Chief Complaint  Patient presents with  . Knee Injury   HPI Comments: Linda Buckley is a 60 y.o. female with h/o HTN who presents to the Emergency Department complaining of right knee pain with swelling s/p mechanical fall 4 hours ago. Pt fell and landed on her right knee. No LOC or head injury. She has h/o right knee pain and notes that this is significantly worse. Pt is ambulatory with a cane. She has not tried any OTC medications PTA. Denies any additional injuries. Pt further denies fever, chills, numbness, or any other associated symptoms.   The history is provided by the patient. No language interpreter was used.    Past Medical History:  Diagnosis Date  . Anxiety   . Arthritis   . Carpal tunnel syndrome of right wrist    RECURRENT  . Depression   . Environmental allergies    dust mites, pollen, roaches and other insects, mold  . Headache    migraines  . Hereditary and idiopathic peripheral neuropathy 04/03/2016  . History of adenomatous polyp of colon   . History of gastric ulcer   . History of kidney stones   . Hypertension   . Pneumonia   . Wears glasses     Patient Active Problem List   Diagnosis Date Noted  . Multiple falls 09/04/2016  . Hereditary and idiopathic peripheral neuropathy 04/03/2016  . Pleuritic chest pain 02/15/2016  . Exertional shortness of breath 01/26/2016  . Right-sided chest pain 01/26/2016  . Left leg pain 01/26/2016  . Trochanteric bursitis of both hips 12/19/2015  . Depression 08/28/2015  . Fibromyalgia 07/28/2015  . Right wrist pain 04/05/2015  . Adjustment disorder with mixed anxiety and depressed  mood 04/05/2015  . Routine general medical examination at a health care facility 03/01/2014  . HTN (hypertension) 01/12/2014  . Obesity (BMI 30-39.9) 01/12/2014  . Pre-diabetes 01/12/2014  . Family history of lupus erythematosus 01/12/2014  . Cough due to bronchospasm 01/12/2014  . Cough 11/19/2013  . Abdominal pain 10/17/2013  . Fatigue 10/17/2013  . CAP (community acquired pneumonia) 10/11/2013    Past Surgical History:  Procedure Laterality Date  . CARPAL TUNNEL RELEASE Right 2007  . CARPAL TUNNEL RELEASE Right 07/05/2015   Procedure: RIGHT HAND REVISION CARPAL TUNNEL RELEASE;  Surgeon: Iran Planas, MD;  Location: [facility];  Service: Orthopedics;  Laterality: Right;  . CARPAL TUNNEL RELEASE Left 09/2016  . CERVICAL DISC ARTHROPLASTY  03/2015  . COLONOSCOPY W/ POLYPECTOMY  04-15-2014  . CYSTO/  RIGHT RETROGRADE PYELOGRAM/ URETEROSCOPY STONE EXTRACTION/  STENT PLACEMENT  06-07-2010  . LAPAROSCOPIC ASSISTED VAGINAL HYSTERECTOMY  04-25-2004   ovaries remain  . TUBAL LIGATION  1982    OB History    No data available       Home Medications    Prior to Admission medications   Medication Sig Start Date End Date Taking? Authorizing Provider  ALPRAZolam Duanne Moron) 0.5 MG tablet TAKE 1 TABLET BY MOUTH TWICE DAILY AS NEEDED FOR ANXIETY 12/02/16   Midge Minium, MD  beclomethasone (QVAR) 80 MCG/ACT inhaler Inhale 1 puff into the lungs 2 (two) times daily. 10/21/16   Midge Minium,  MD  buPROPion (WELLBUTRIN XL) 150 MG 24 hr tablet TAKE 1 TABLET(150 MG) BY MOUTH DAILY 10/21/16   Midge Minium, MD  Cholecalciferol (VITAMIN D PO) Take 1 tablet by mouth daily.    Historical Provider, MD  clonazePAM (KLONOPIN) 0.5 MG tablet Take 1 tablet (0.5 mg total) by mouth at bedtime. 10/08/16   Midge Minium, MD  DULoxetine (CYMBALTA) 30 MG capsule Take 1 capsule (30 mg total) by mouth daily. 09/12/16   Kathrynn Ducking, MD  DULoxetine (CYMBALTA) 60 MG capsule Take 1 capsule (60 mg total)  by mouth daily. 09/11/16   Midge Minium, MD  EPINEPHrine (EPIPEN) 0.3 mg/0.3 mL SOAJ injection Inject 0.3 mg into the muscle once.    Historical Provider, MD  losartan-hydrochlorothiazide (HYZAAR) 50-12.5 MG tablet TAKE 1 TABLET BY MOUTH EVERY DAY 08/26/16   Midge Minium, MD  ondansetron (ZOFRAN-ODT) 4 MG disintegrating tablet Take by mouth.    Historical Provider, MD  oxyCODONE-acetaminophen (PERCOCET) 7.5-325 MG tablet TK 1 T PO BID PRN P 08/16/16   Historical Provider, MD  predniSONE (DELTASONE) 5 MG tablet Begin taking 6 tablets daily, taper by one tablet daily until off the medication. 12/02/16   Kathrynn Ducking, MD  pregabalin (LYRICA) 75 MG capsule One capsule in the morning and two in the evening 08/02/16   Kathrynn Ducking, MD  ranitidine (ZANTAC) 300 MG tablet TAKE 1 TABLET(300 MG) BY MOUTH AT BEDTIME 06/26/16   Midge Minium, MD  VENTOLIN HFA 108 [phone] Base) MCG/ACT inhaler INHALE 2 PUFFS BY MOUTH EVERY 4 HOURS AS NEEDED FOR SHORTNESS OF BREATH 10/07/16   Midge Minium, MD    Family History Family History  Problem Relation Age of Onset  . Asthma Father   . Arthritis Mother   . Hypertension Mother   . Diabetes Mother   . Cancer Mother   . Diabetes Brother   . Diabetes Brother   . Asthma Son   . Cancer Maternal Grandfather     colon  . Colon cancer Maternal Grandfather   . Cancer Maternal Aunt     breast  . Cancer Maternal Uncle     lung  . Colon cancer Maternal Uncle   . Cancer Maternal Grandmother     ovarian  . Diabetes Son     Social History Social History  Substance Use Topics  . Smoking status: Never Smoker  . Smokeless tobacco: Never Used  . Alcohol use Yes     Comment: 2 glasses of wine weekly     Allergies   Aspirin and Bee venom   Review of Systems Review of Systems  Constitutional: Negative for chills and fever.  Musculoskeletal: Positive for arthralgias and joint swelling.  Neurological: Negative for numbness.  All other  systems reviewed and are negative.    Physical Exam Updated Vital Signs BP 120/77 (BP Location: Left Arm)   Pulse 74   Temp 98.8 F (37.1 C) (Oral)   Resp 18   Ht 5\' 6"  (1.676 m)   Wt 247 lb (112 kg)   SpO2 98%   BMI 39.87 kg/m   Physical Exam  Constitutional: She appears well-developed and well-nourished.  HENT:  Head: Normocephalic.  Eyes: Conjunctivae are normal.  Cardiovascular: Normal rate.   Pulmonary/Chest: Effort normal. No respiratory distress.  Abdominal: She exhibits no distension.  Musculoskeletal: Normal range of motion. She exhibits tenderness.  Effusion to right knee. Pain to palpation. No gross instability.  Neurological: She is alert.  Skin: Skin is warm and dry.  Psychiatric: She has a normal mood and affect. Her behavior is normal.  Nursing note and vitals reviewed.    ED Treatments / Results  DIAGNOSTIC STUDIES: Oxygen Saturation is 98% on RA, normal by my interpretation.    COORDINATION OF CARE: 5:00 PM Discussed treatment plan with pt at bedside which includes brace and pain Rx and pt agreed to plan.  Labs (all labs ordered are listed, but only abnormal results are displayed) Labs Reviewed - No data to display  EKG  EKG Interpretation None       Radiology Dg Knee Complete 4 Views Right  Result Date: 12/06/2016 CLINICAL DATA:  Right knee pain, status post fall EXAM: RIGHT KNEE - COMPLETE 4+ VIEW COMPARISON:  04/06/2013 FINDINGS: No fracture or dislocation is seen. The joint spaces are preserved. Tiny suprapatellar knee joint effusion. IMPRESSION: No focal osseous lesion is seen. Electronically Signed   By: Julian Hy M.D.   On: 12/06/2016 16:09    Procedures Procedures (including critical care time)  Medications Ordered in ED Medications - No data to display   Initial Impression / Assessment and Plan / ED Course  I have reviewed the triage vital signs and the nursing notes.  Pertinent labs & imaging results that were  available during my care of the patient were reviewed by me and considered in my medical decision making (see chart for details).  Clinical Course       Final Clinical Impressions(s) / ED Diagnoses   Final diagnoses:  Injury of right knee, initial encounter  Sprain of right knee, unspecified ligament, initial encounter    New Prescriptions Discharge Medication List as of 12/06/2016  5:16 PM    START taking these medications   Details  HYDROcodone-acetaminophen (NORCO/VICODIN) 5-325 MG tablet Take 2 tablets by mouth every 4 (four) hours as needed., Starting Fri 12/06/2016, Print      An After Visit Summary was printed and given to the patient.   I personally performed the services in this documentation, which was scribed in my presence.  The recorded information has been reviewed and considered.   Ronnald Collum.   Hollace Kinnier Trevorton, PA-C 12/06/16 [place], MD 12/07/16 2352

## 2016-12-06 NOTE — ED Triage Notes (Signed)
C/o hx right knee pain however pt fell today on right knee-NAD-slow steady gait with own cane

## 2016-12-06 NOTE — Discharge Instructions (Signed)
See your Physician for recheck.  Return if any problems.  °

## 2016-12-23 ENCOUNTER — Ambulatory Visit: Payer: BLUE CROSS/BLUE SHIELD | Admitting: Neurology

## 2016-12-30 ENCOUNTER — Ambulatory Visit (INDEPENDENT_AMBULATORY_CARE_PROVIDER_SITE_OTHER): Payer: BLUE CROSS/BLUE SHIELD | Admitting: Family Medicine

## 2016-12-30 ENCOUNTER — Other Ambulatory Visit: Payer: Self-pay | Admitting: Family Medicine

## 2016-12-30 ENCOUNTER — Other Ambulatory Visit: Payer: Self-pay | Admitting: Neurology

## 2016-12-30 ENCOUNTER — Encounter: Payer: Self-pay | Admitting: Family Medicine

## 2016-12-30 VITALS — BP 142/82 | HR 82 | Temp 97.8°F | Resp 14 | Ht 66.0 in | Wt 250.0 lb

## 2016-12-30 DIAGNOSIS — F4323 Adjustment disorder with mixed anxiety and depressed mood: Secondary | ICD-10-CM

## 2016-12-30 DIAGNOSIS — I10 Essential (primary) hypertension: Secondary | ICD-10-CM

## 2016-12-30 MED ORDER — BUPROPION HCL ER (XL) 300 MG PO TB24: 300.0000 mg | ORAL_TABLET | Freq: Every day | ORAL | 3 refills | Status: DC

## 2016-12-30 NOTE — Progress Notes (Signed)
Pre visit review using our clinic review tool, if applicable. No additional management support is needed unless otherwise documented below in the visit note. 

## 2016-12-30 NOTE — Patient Instructions (Signed)
Follow up in 3 weeks to recheck mood and BP Increase the Wellbutrin to 300mg  daily (2 of what you currently have and 1 of the new prescription) Continue the Cymbalta daily Call your insurance and ask which inhaled corticosteroid inhaler they prefer (ICS) since Qvar is too expensive Increase your water intake, limit your salt intake to improve BP Call and schedule an appt with a counselor- [facility] (multiple locations) [phone], [place] [phone] Call with any questions or concerns Hang in there!  We'll figure this out!!!

## 2016-12-30 NOTE — Progress Notes (Signed)
   Subjective:    Patient ID: Linda Buckley, female    DOB: [birth year], 61 y.o.   MRN: [id number]  HPI HTN- chronic problem.  Currently on Losartan 50/12.5 mg daily.  Pt reports 'BP has been going up and down'.  Feet have been swelling intermittently.  Denies increased salt intake.  Having increased headaches.    Depression- ongoing issue for pt.  On Cymbalta 90mg  daily, Wellbutrin 150mg .  Pt is crying more often, losing motivation to get up and get dressed.  Upset about what she is not able to do.  Pt is very frustrated.  Increased fatigue.  Pt had suicidal thoughts last week when she called her sister.   Review of Systems For ROS see HPI     Objective:   Physical Exam  Constitutional: She is oriented to person, place, and time. She appears well-developed and well-nourished. No distress.  obese  HENT:  Head: Normocephalic and atraumatic.  Eyes: Conjunctivae and EOM are normal. Pupils are equal, round, and reactive to light.  Neck: Normal range of motion. Neck supple. No thyromegaly present.  Cardiovascular: Normal rate, regular rhythm, normal heart sounds and intact distal pulses.   No murmur heard. Pulmonary/Chest: Effort normal and breath sounds normal. No respiratory distress.  Abdominal: Soft. She exhibits no distension. There is no tenderness.  Musculoskeletal: She exhibits no edema.  Lymphadenopathy:    She has no cervical adenopathy.  Neurological: She is alert and oriented to person, place, and time.  Skin: Skin is warm and dry.  Psychiatric: She has a normal mood and affect. Her behavior is normal.  tearful  Vitals reviewed.         Assessment & Plan:

## 2016-12-30 NOTE — Assessment & Plan Note (Signed)
Deteriorated.  I suspect that pt's elevated BP is due to her current level of pain and her anxiety today.  Recent BPs have been as low as 102/54 which makes me nervous to adjust meds.  Will follow closely.

## 2016-12-31 NOTE — Telephone Encounter (Signed)
Last OV 12/30/16 Alprazolam last filled 12/02/16 #60 with 0

## 2017-01-03 ENCOUNTER — Telehealth: Payer: Self-pay | Admitting: Neurology

## 2017-01-03 NOTE — Telephone Encounter (Signed)
The patient insists that since Monday she has tried to have Lyrica preauthorized? I extend ability office was 2 days closed and the PAs may take 3-5 business days. There is also 60 and 30 mg Lyrica prescribed and I do not know which she needs refilled.  She will geta call from Dr Jannifer Franklin RN  on Monday CD

## 2017-01-03 NOTE — Telephone Encounter (Signed)
I called patient. The 75 mg Lyrica capsule will require preauthorization. We will try to get this done.

## 2017-01-03 NOTE — Telephone Encounter (Signed)
Patient called exactly at 12 noon after office closure asking for Lyrica refill. Her Lyrica needs preauthorization Dr. Jannifer Franklin please address patient's phone #[phone], [phone].

## 2017-01-03 NOTE — Telephone Encounter (Signed)
Lyrica 75 mg PA ref # [id number] approved 01/02/17 - 12/15/2038 per [employer] T # [phone] F # [id number]. Approval faxed to pharmacy and pt notified via TC.

## 2017-01-04 NOTE — Assessment & Plan Note (Signed)
Deteriorated.  Pt is having suicidal thoughts periodically.  Denies current SI/HI and denies having a plan.  Able to contract for safety.  Names and #s given for counselors to discuss her ongoing health issues and loss of independence.  Increase Wellbutrin to 300mg  daily and monitor closely for improvement.  Pt expressed understanding and is in agreement w/ plan.

## 2017-01-07 ENCOUNTER — Other Ambulatory Visit: Payer: Self-pay | Admitting: Family Medicine

## 2017-01-07 ENCOUNTER — Telehealth: Payer: Self-pay | Admitting: Neurology

## 2017-01-07 MED ORDER — PREGABALIN 100 MG PO CAPS: ORAL_CAPSULE | ORAL | 1 refills | Status: DC

## 2017-01-07 NOTE — Telephone Encounter (Signed)
Rx printed, signed, faxed to pharmacy. 

## 2017-01-07 NOTE — Telephone Encounter (Signed)
Pt called said she had severe pain last night shoulders down the back, legs to the feet. Says her legs bother he the most. She called the ED but she was advised they could not do anything for her, she would need to call her provider. Says for the past month the pain has been all over the body intermittent, last night it was constant with no relief. She is wanting to know what she can do when this happens.

## 2017-01-07 NOTE — Telephone Encounter (Signed)
The patient called indicating that she has occasional bouts of severe generalized pain. She has been given a prescription for hydrocodone recently, she may take this if needed. She reports increased generalized pain all the time with the fibromyalgia, we will go up on the Lyrica dose taking 100 mg in the morning and 200 mg in the evening.

## 2017-01-08 ENCOUNTER — Other Ambulatory Visit (HOSPITAL_COMMUNITY): Payer: Self-pay | Admitting: Orthopedic Surgery

## 2017-01-08 DIAGNOSIS — M7711 Lateral epicondylitis, right elbow: Secondary | ICD-10-CM

## 2017-01-14 ENCOUNTER — Other Ambulatory Visit: Payer: Self-pay

## 2017-01-14 ENCOUNTER — Other Ambulatory Visit: Payer: Self-pay | Admitting: Neurology

## 2017-01-15 ENCOUNTER — Other Ambulatory Visit: Payer: Self-pay | Admitting: Neurology

## 2017-01-17 ENCOUNTER — Telehealth: Payer: Self-pay

## 2017-01-17 NOTE — Telephone Encounter (Signed)
Medical assessment forms for social security/disability from [employer] completed, signed and returned to medical records for payment/processing.

## 2017-01-19 ENCOUNTER — Other Ambulatory Visit: Payer: Self-pay | Admitting: Family Medicine

## 2017-01-20 ENCOUNTER — Ambulatory Visit: Payer: Self-pay | Admitting: Family Medicine

## 2017-01-20 ENCOUNTER — Ambulatory Visit (HOSPITAL_COMMUNITY)
Admission: RE | Admit: 2017-01-20 | Discharge: 2017-01-20 | Disposition: A | Discharge status: Home / Self Care | Payer: BLUE CROSS/BLUE SHIELD | Source: Ambulatory Visit | Attending: Orthopedic Surgery | Admitting: Orthopedic Surgery

## 2017-01-20 ENCOUNTER — Ambulatory Visit: Payer: Self-pay | Admitting: Neurology

## 2017-01-20 DIAGNOSIS — M7711 Lateral epicondylitis, right elbow: Secondary | ICD-10-CM | POA: Diagnosis present

## 2017-01-24 ENCOUNTER — Ambulatory Visit: Payer: Self-pay | Admitting: Family Medicine

## 2017-01-24 ENCOUNTER — Telehealth: Payer: Self-pay | Admitting: Family Medicine

## 2017-01-24 NOTE — Telephone Encounter (Signed)
Patient called in to advise that she missed her appt today bc of her medication making her forget. She wants to reschedule next week on Monday or Friday. No available open slots that arent sameday. I told her that I would ask if there is a place we can fit her in

## 2017-01-27 NOTE — Telephone Encounter (Signed)
Ok to schedule her

## 2017-01-30 DIAGNOSIS — Z0271 Encounter for disability determination: Secondary | ICD-10-CM

## 2017-01-31 ENCOUNTER — Other Ambulatory Visit: Payer: Self-pay | Admitting: Family Medicine

## 2017-02-03 ENCOUNTER — Encounter: Payer: Self-pay | Admitting: Family Medicine

## 2017-02-03 ENCOUNTER — Ambulatory Visit (INDEPENDENT_AMBULATORY_CARE_PROVIDER_SITE_OTHER): Payer: BLUE CROSS/BLUE SHIELD | Admitting: Family Medicine

## 2017-02-03 VITALS — BP 148/93 | HR 79 | Temp 98.6°F | Resp 16 | Ht 66.0 in | Wt 250.1 lb

## 2017-02-03 DIAGNOSIS — I10 Essential (primary) hypertension: Secondary | ICD-10-CM

## 2017-02-03 DIAGNOSIS — R0789 Other chest pain: Secondary | ICD-10-CM

## 2017-02-03 DIAGNOSIS — F4323 Adjustment disorder with mixed anxiety and depressed mood: Secondary | ICD-10-CM

## 2017-02-03 MED ORDER — LOSARTAN POTASSIUM-HCTZ 100-12.5 MG PO TABS: 1.0000 | ORAL_TABLET | Freq: Every day | ORAL | 3 refills | Status: DC

## 2017-02-03 NOTE — Progress Notes (Signed)
Pre visit review using our clinic review tool, if applicable. No additional management support is needed unless otherwise documented below in the visit note. 

## 2017-02-03 NOTE — Patient Instructions (Addendum)
Follow up in 1 month to recheck BP Increase the Losartan HCTZ to 100/12.5mg  daily (new prescription sent) Continue the Wellbutrin at 300mg  daily The chest pain does not appear to be cardiac (thankfully!) but we are going to refer you to cardiology for an evaluation- just to be sure!!! Call with any questions or concerns If chest pain changes or worsens, please go to the [place] in there!!!

## 2017-02-03 NOTE — Progress Notes (Signed)
   Subjective:    Patient ID: Linda Buckley, female    DOB: [birth year], 61 y.o.   MRN: [id number]  HPI HTN- chronic problem, on Losartan HCTZ 50/12.5mg  daily.  + CP, SOB but pt feels this is due to her fibro.  No HAs, visual changes.  CP- sxs started 'a couple of weeks ago'.  Painful to touch.  Starts at sternum and radiates to L upper breast.  Worse w/ movement.  Somewhat better w/ rest.  Depression/Anxiety- pt feels that increasing the dose of Wellbutrin to 300mg  has helped, particularly w/ sleep at night.   Review of Systems For ROS see HPI     Objective:   Physical Exam  Constitutional: She is oriented to person, place, and time. She appears well-developed and well-nourished. No distress.  HENT:  Head: Normocephalic and atraumatic.  Eyes: Conjunctivae and EOM are normal. Pupils are equal, round, and reactive to light.  Neck: Normal range of motion. Neck supple. No thyromegaly present.  Cardiovascular: Normal rate, regular rhythm, normal heart sounds and intact distal pulses.   No murmur heard. Pulmonary/Chest: Effort normal and breath sounds normal. No respiratory distress. She exhibits tenderness (TTP over anterior chest bilaterally).  Abdominal: Soft. She exhibits no distension. There is no tenderness.  Musculoskeletal: She exhibits no edema.  Lymphadenopathy:    She has no cervical adenopathy.  Neurological: She is alert and oriented to person, place, and time.  Skin: Skin is warm and dry.  Psychiatric: She has a normal mood and affect. Her behavior is normal.  Vitals reviewed.         Assessment & Plan:

## 2017-02-09 ENCOUNTER — Other Ambulatory Visit: Payer: Self-pay | Admitting: Family Medicine

## 2017-02-10 ENCOUNTER — Other Ambulatory Visit: Payer: Self-pay | Admitting: General Practice

## 2017-02-10 MED ORDER — BECLOMETHASONE DIPROP HFA 80 MCG/ACT IN AERB: 1.0000 | INHALATION_SPRAY | Freq: Two times a day (BID) | RESPIRATORY_TRACT | 3 refills | Status: DC

## 2017-02-11 ENCOUNTER — Other Ambulatory Visit: Payer: Self-pay | Admitting: Family Medicine

## 2017-02-12 NOTE — Assessment & Plan Note (Signed)
Chronic problem, mildly improved since increasing the Wellbutrin.  No med changes at this time.  Will follow closely.

## 2017-02-12 NOTE — Assessment & Plan Note (Signed)
Deteriorated.  BP is elevated today.  Increase Losartan to 100mg  daily and continue to monitor closely.  Reviewed supportive care and red flags that should prompt return.  Pt expressed understanding and is in agreement w/ plan.

## 2017-02-12 NOTE — Assessment & Plan Note (Signed)
New.  Suspect that this is musculoskeletal rather than cardiac and EKG results support this.  But given risk factors- HTN, obesity, pre-diabetes- will refer to cardiology for risk stratification.  Reviewed supportive care and red flags that should prompt return.  Pt expressed understanding and is in agreement w/ plan.

## 2017-02-13 NOTE — Progress Notes (Signed)
Cardiology Office Note    Date:  02/14/2017   ID:  CORTNI NEWVINE, DOB [birth year], MRN [id number]  PCP:  Annye Asa, MD  Cardiologist: New to [facility] - Dr. Oval Linsey  Chief Complaint  Patient presents with  . New Patient (Initial Visit)    Chest Pain    History of Present Illness:    Linda Buckley is a 61 y.o. female with past medical history of HTN, anxiety, depression, Fibromyalgia, arthritis. OSA (on CPAP) and Migraine headaches who presents to the office today as a new patient referral for evaluation of chest pain.  Was seen by her PCP on 02/03/2017 reporting chest discomfort for the past several weeks. Reported it went along her sternum and radiated to her right breast, tender to palpation and worse with movement. Somewhat improved with rest. Was noted to be tender to palpation along her anterior chest on examination. EKG showed NSR, HR 76, with no acute ST or T-wave changes. Due to her risk factors of HTN, obesity, and prediabetes, a cardiology referral was recommended.   In talking with the patient today, she reports having episodes of dyspnea with exertion for over the past year. Reports she was diagnosed with fibromyalgia and her dyspnea has worsened since she is now less active. Over the past 6 months, she has developed episodes of chest discomfort. She describes this as an aching sensation along her sternum which can occur at rest or with exertion. If occurring with exertion, this pain improves upon her sitting down and resting. She reports associated dyspnea and headaches. Denies any nausea, vomiting, or diaphoresis. Has noted her chest is tender to palpation at times when this occurs, but this is not always a recurrent component.  She denies any prior history of coronary disease. Does have a family history of CAD and her cousins and uncles, reporting this occurred in their 50s to 2s. Denies any prior tobacco use, alcohol use, or recreational drug use.   Past Medical  History:  Diagnosis Date  . Anxiety   . Arthritis   . Carpal tunnel syndrome of right wrist    RECURRENT  . Depression   . Environmental allergies    dust mites, pollen, roaches and other insects, mold  . Headache    migraines  . Hereditary and idiopathic peripheral neuropathy 04/03/2016  . History of adenomatous polyp of colon   . History of gastric ulcer   . History of kidney stones   . Hypertension   . Pneumonia   . Wears glasses     Past Surgical History:  Procedure Laterality Date  . CARPAL TUNNEL RELEASE Right 2007  . CARPAL TUNNEL RELEASE Right 07/05/2015   Procedure: RIGHT HAND REVISION CARPAL TUNNEL RELEASE;  Surgeon: Iran Planas, MD;  Location: [facility];  Service: Orthopedics;  Laterality: Right;  . CARPAL TUNNEL RELEASE Left 09/2016  . CERVICAL DISC ARTHROPLASTY  03/2015  . COLONOSCOPY W/ POLYPECTOMY  04-15-2014  . CYSTO/  RIGHT RETROGRADE PYELOGRAM/ URETEROSCOPY STONE EXTRACTION/  STENT PLACEMENT  06-07-2010  . LAPAROSCOPIC ASSISTED VAGINAL HYSTERECTOMY  04-25-2004   ovaries remain  . TUBAL LIGATION  1982    Current Medications: Outpatient Medications Prior to Visit  Medication Sig Dispense Refill  . ALPRAZolam (XANAX) 0.5 MG tablet TAKE 1 TABLET BY MOUTH TWICE DAILY AS NEEDED FOR ANXIETY 60 tablet 0  . beclomethasone (QVAR) 80 MCG/ACT inhaler Inhale 1 puff into the lungs 2 (two) times daily. 1 Inhaler 3  . Beclomethasone Diprop HFA (QVAR REDIHALER)  80 MCG/ACT AERB Inhale 1 puff into the lungs 2 (two) times daily. 1 Inhaler 3  . buPROPion (WELLBUTRIN XL) 300 MG 24 hr tablet Take 1 tablet (300 mg total) by mouth daily. 30 tablet 3  . Cholecalciferol (VITAMIN D PO) Take 1 tablet by mouth daily.    . clonazePAM (KLONOPIN) 0.5 MG tablet Take 1 tablet (0.5 mg total) by mouth at bedtime. 30 tablet 3  . DULoxetine (CYMBALTA) 30 MG capsule TAKE 1 CAPSULE(30 MG) BY MOUTH DAILY 30 capsule 11  . DULoxetine (CYMBALTA) 60 MG capsule TAKE 1 CAPSULE(60 MG) BY MOUTH DAILY 30  capsule 6  . EPINEPHrine (EPIPEN) 0.3 mg/0.3 mL SOAJ injection Inject 0.3 mg into the muscle once.    Marland Kitchen HYDROcodone-acetaminophen (NORCO/VICODIN) 5-325 MG tablet Take 2 tablets by mouth every 4 (four) hours as needed. 12 tablet 0  . losartan-hydrochlorothiazide (HYZAAR) 100-12.5 MG tablet Take 1 tablet by mouth daily. 30 tablet 3  . ondansetron (ZOFRAN-ODT) 4 MG disintegrating tablet Take by mouth.    . pregabalin (LYRICA) 100 MG capsule One capsule the morning, 2 in the evening 270 capsule 1  . ranitidine (ZANTAC) 300 MG tablet TAKE 1 TABLET(300 MG) BY MOUTH AT BEDTIME 30 tablet 6  . VENTOLIN HFA 108 (90 Base) MCG/ACT inhaler INHALE 2 PUFFS BY MOUTH EVERY 4 HOURS AS NEEDED FOR SHORTNESS OF BREATH 36 g 0   No facility-administered medications prior to visit.      Allergies:   Aspirin and Bee venom   Social History   Social History  . Marital status: Married    Spouse name: N/A  . Number of children: 2  . Years of education: 2 yrs college   Occupational History  . [employer]     Dance movement psychotherapist   Social History Main Topics  . Smoking status: Never Smoker  . Smokeless tobacco: Never Used  . Alcohol use Yes     Comment: 2 glasses of wine weekly  . Drug use: No  . Sexual activity: Not on file   Other Topics Concern  . Not on file   Social History Narrative   Lives at home w/ her husband   Right-handed   Drinks 1-2 cups of coffee daily and 2 cups of tea per week     Family History:  The patient's family history includes Arthritis in her mother; Asthma in her father and son; Cancer in her maternal aunt, maternal grandfather, maternal grandmother, maternal uncle, and mother; Colon cancer in her maternal grandfather and maternal uncle; Diabetes in her brother, brother, mother, and son; Hypertension in her mother.   Review of Systems:   Please see the history of present illness.     General:  No chills, fever, night sweats or weight changes.  Cardiovascular:  No edema, orthopnea,  palpitations, paroxysmal nocturnal dyspnea. Positive for chest pain and dyspnea on exertion.  Dermatological: No rash, lesions/masses Respiratory: No cough, Positive for dyspnea Urologic: No hematuria, dysuria Abdominal:   No nausea, vomiting, diarrhea, bright red blood per rectum, melena, or hematemesis Neurologic:  No visual changes, wkns, changes in mental status. All other systems reviewed and are otherwise negative except as noted above.   Physical Exam:    VS:  BP 120/83   Pulse 77   Ht 5\' 6"  (1.676 m)   Wt 246 lb (111.6 kg)   BMI 39.71 kg/m    General: Well developed, well nourished [place] American female appearing in no acute distress. Head: Normocephalic, atraumatic, sclera non-icteric,  no xanthomas, nares are without discharge.  Neck: No carotid bruits. JVD not elevated.  Lungs: Respirations regular and unlabored, without wheezes or rales.  Heart: Regular rate and rhythm. No S3 or S4.  No murmur, no rubs, or gallops appreciated. Abdomen: Soft, non-tender, non-distended with normoactive bowel sounds. No hepatomegaly. No rebound/guarding. No obvious abdominal masses. Msk:  Strength and tone appear normal for age. No joint deformities or effusions. Extremities: No clubbing or cyanosis. Trace lower extremity edema.  Distal pedal pulses are 2+ bilaterally. Neuro: Alert and oriented X 3. Moves all extremities spontaneously. No focal deficits noted. Psych:  Responds to questions appropriately with a normal affect. Skin: No rashes or lesions noted  Wt Readings from Last 3 Encounters:  02/14/17 246 lb (111.6 kg)  02/03/17 250 lb 2 oz (113.5 kg)  12/30/16 250 lb (113.4 kg)     Studies/Labs Reviewed:   EKG:  EKG is ordered today.  The ekg ordered today demonstrates NSR, HR 73, with no acute ST or T-wave changes when compared to prior tracings.   Recent Labs: 10/28/2016: ALT 37; BUN 19; Creatinine, Ser 0.93; Hemoglobin 12.4; Platelets 225.0; Potassium 4.0; Sodium 144; TSH 0.77     Lipid Panel    Component Value Date/Time   CHOL 182 10/28/2016 1443   TRIG 109.0 10/28/2016 1443   HDL 66.40 10/28/2016 1443   CHOLHDL 3 10/28/2016 1443   VLDL 21.8 10/28/2016 1443   LDLCALC 94 10/28/2016 1443    Additional studies/ records that were reviewed today include:   Carotid US: 10/2015 Summary: Findings suggest 1-39% internal carotid artery stenosis bilaterally. Vertebral arteries are patent with antegrade flow.  Other specific details can be found in the table(s) above. Prepared and Electronically Authenticated by  Assessment:    1. Chest pain, unspecified type   2. Essential hypertension   3. Fibromyalgia   4. OSA (obstructive sleep apnea)      Plan:   In order of problems listed above:  1. Chest Pain with Mixed Typical and Atypical Features  - reports worsening dyspnea with exertion for over the past year. Over the past 6 months, she has developed episodes of chest discomfort along her sternum which can occur at rest or with exertion and lasts for up to 20 minutes. Pain improves with rest but is sometimes reproducible with palpation. No associated symptoms.  - no prior cardiac history. Does have a family history of CAD.  - EKG today is without acute ischemic changes.  - Will obtain a Lexiscan Myoview to assess for ischemia in the setting of her new symptoms and cardiac risk factors. This assessment and plan was reviewed with Dr. Oval Linsey (DOD) as the patient is new to our practice.   2. Essential HTN - BP well-controlled at 120/83 during today's visit. - continue Hyzaar 100-12.5mg  daily.   3. Fibromyalgia - Followed by PCP. Remains on Lyrica.  4. OSA - reports good compliance with CPAP.  Medication Adjustments/Labs and Tests Ordered: Current medicines are reviewed at length with the patient today.  Concerns regarding medicines are outlined above.  Medication changes, Labs and Tests ordered today are listed in the Patient Instructions  below. Patient Instructions  Your physician has requested that you have a lexiscan myoview. For further information please visit [email or web address]. Please follow instruction sheet, as given.   Your physician wants you to follow-up in: 6 months or sooner If stress test positive with Dr Oval Linsey. You will receive a reminder letter in the mail two months in advance.  If you don't receive a letter, please call our office to schedule the follow-up appointment.  If you need a refill on your cardiac medications before your next appointment, please call your pharmacy.   Arna Medici, [place]  02/14/2017 4:42 PM    [facility] [address], [place], [place]  [address] Phone: [phone]; Fax: [phone]  [address], [place], [place] [address] Phone: [phone]

## 2017-02-14 ENCOUNTER — Ambulatory Visit (INDEPENDENT_AMBULATORY_CARE_PROVIDER_SITE_OTHER): Payer: BLUE CROSS/BLUE SHIELD | Admitting: Student

## 2017-02-14 ENCOUNTER — Encounter: Payer: Self-pay | Admitting: Student

## 2017-02-14 VITALS — BP 120/83 | HR 77 | Ht 66.0 in | Wt 246.0 lb

## 2017-02-14 DIAGNOSIS — G4733 Obstructive sleep apnea (adult) (pediatric): Secondary | ICD-10-CM

## 2017-02-14 DIAGNOSIS — R079 Chest pain, unspecified: Secondary | ICD-10-CM | POA: Diagnosis not present

## 2017-02-14 DIAGNOSIS — M797 Fibromyalgia: Secondary | ICD-10-CM | POA: Diagnosis not present

## 2017-02-14 DIAGNOSIS — I10 Essential (primary) hypertension: Secondary | ICD-10-CM | POA: Diagnosis not present

## 2017-02-14 NOTE — Patient Instructions (Signed)
Your physician has requested that you have a lexiscan myoview. For further information please visit [email or web address]. Please follow instruction sheet, as given.   Your physician wants you to follow-up in: 6 months or sooner If stress test positive with Dr Oval Linsey. You will receive a reminder letter in the mail two months in advance. If you don't receive a letter, please call our office to schedule the follow-up appointment.   If you need a refill on your cardiac medications before your next appointment, please call your pharmacy.

## 2017-02-18 ENCOUNTER — Telehealth (HOSPITAL_COMMUNITY): Payer: Self-pay

## 2017-02-18 NOTE — Telephone Encounter (Signed)
Encounter complete. 

## 2017-02-19 ENCOUNTER — Other Ambulatory Visit: Payer: Self-pay | Admitting: Family Medicine

## 2017-02-19 ENCOUNTER — Telehealth (HOSPITAL_COMMUNITY): Payer: Self-pay

## 2017-02-19 NOTE — Telephone Encounter (Signed)
Encounter complete. 

## 2017-02-20 ENCOUNTER — Ambulatory Visit (HOSPITAL_COMMUNITY)
Admission: RE | Admit: 2017-02-20 | Discharge: 2017-02-20 | Disposition: A | Discharge status: Home / Self Care | Payer: BLUE CROSS/BLUE SHIELD | Source: Ambulatory Visit | Attending: Internal Medicine | Admitting: Internal Medicine

## 2017-02-20 DIAGNOSIS — G4733 Obstructive sleep apnea (adult) (pediatric): Secondary | ICD-10-CM | POA: Insufficient documentation

## 2017-02-20 DIAGNOSIS — E669 Obesity, unspecified: Secondary | ICD-10-CM | POA: Insufficient documentation

## 2017-02-20 DIAGNOSIS — Z6839 Body mass index (BMI) 39.0-39.9, adult: Secondary | ICD-10-CM | POA: Diagnosis not present

## 2017-02-20 DIAGNOSIS — R002 Palpitations: Secondary | ICD-10-CM | POA: Diagnosis not present

## 2017-02-20 DIAGNOSIS — R0609 Other forms of dyspnea: Secondary | ICD-10-CM | POA: Insufficient documentation

## 2017-02-20 DIAGNOSIS — R079 Chest pain, unspecified: Secondary | ICD-10-CM | POA: Diagnosis not present

## 2017-02-20 DIAGNOSIS — I10 Essential (primary) hypertension: Secondary | ICD-10-CM | POA: Insufficient documentation

## 2017-02-20 MED ORDER — REGADENOSON 0.4 MG/5ML IV SOLN
0.4000 mg | Freq: Once | INTRAVENOUS | Status: AC
  Administered 2017-02-20: 0.4 mg via INTRAVENOUS

## 2017-02-20 MED ORDER — AMINOPHYLLINE 25 MG/ML IV SOLN
75.0000 mg | Freq: Once | INTRAVENOUS | Status: AC
  Administered 2017-02-20: 75 mg via INTRAVENOUS

## 2017-02-20 MED ORDER — TECHNETIUM TC 99M TETROFOSMIN IV KIT
31.8000 | PACK | Freq: Once | INTRAVENOUS | Status: AC | PRN
Start: 2017-02-20 — End: 2017-02-20
  Administered 2017-02-20: 31.8 via INTRAVENOUS
  Filled 2017-02-20: qty 32

## 2017-02-21 ENCOUNTER — Ambulatory Visit (HOSPITAL_COMMUNITY)
Admission: RE | Admit: 2017-02-21 | Discharge: 2017-02-21 | Disposition: A | Discharge status: Home / Self Care | Payer: BLUE CROSS/BLUE SHIELD | Source: Ambulatory Visit | Attending: Cardiology | Admitting: Cardiology

## 2017-02-21 LAB — MYOCARDIAL PERFUSION IMAGING
CHL CUP RESTING HR STRESS: 62 {beats}/min
LVDIAVOL: 92 mL (ref 46–106)
LVSYSVOL: 42 mL
Peak HR: 106 {beats}/min
SDS: 3
SRS: 1
SSS: 4
TID: 0.97

## 2017-02-21 MED ORDER — TECHNETIUM TC 99M TETROFOSMIN IV KIT
31.1000 | PACK | Freq: Once | INTRAVENOUS | Status: AC | PRN
  Administered 2017-02-21: 31.1 via INTRAVENOUS

## 2017-02-26 ENCOUNTER — Other Ambulatory Visit: Payer: Self-pay | Admitting: Family Medicine

## 2017-02-27 ENCOUNTER — Other Ambulatory Visit: Payer: Self-pay | Admitting: Family Medicine

## 2017-02-27 NOTE — Telephone Encounter (Signed)
Last OV 02/03/17 Clonazepam last filled 10/08/16 #30 with 3

## 2017-02-27 NOTE — Telephone Encounter (Signed)
Medication filled to pharmacy as requested.   

## 2017-02-28 NOTE — Telephone Encounter (Signed)
Last OV 02/03/17 Clonazepam last filled 02/27/17 #30 with 0

## 2017-03-03 ENCOUNTER — Ambulatory Visit (INDEPENDENT_AMBULATORY_CARE_PROVIDER_SITE_OTHER): Payer: BLUE CROSS/BLUE SHIELD | Admitting: Family Medicine

## 2017-03-03 ENCOUNTER — Telehealth: Payer: Self-pay | Admitting: Family Medicine

## 2017-03-03 ENCOUNTER — Encounter: Payer: Self-pay | Admitting: Family Medicine

## 2017-03-03 VITALS — BP 128/86 | HR 74 | Temp 98.2°F | Resp 16 | Ht 66.0 in | Wt 248.4 lb

## 2017-03-03 DIAGNOSIS — I10 Essential (primary) hypertension: Secondary | ICD-10-CM | POA: Diagnosis not present

## 2017-03-03 DIAGNOSIS — G609 Hereditary and idiopathic neuropathy, unspecified: Secondary | ICD-10-CM | POA: Diagnosis not present

## 2017-03-03 LAB — CBC WITH DIFFERENTIAL/PLATELET
Basophils Absolute: 0 10*3/uL (ref 0.0–0.1)
Basophils Relative: 0.9 % (ref 0.0–3.0)
EOS PCT: 1.7 % (ref 0.0–5.0)
Eosinophils Absolute: 0.1 10*3/uL (ref 0.0–0.7)
HEMATOCRIT: 40.3 % (ref 36.0–46.0)
HEMOGLOBIN: 13 g/dL (ref 12.0–15.0)
LYMPHS PCT: 26.1 % (ref 12.0–46.0)
Lymphs Abs: 0.9 10*3/uL (ref 0.7–4.0)
MCHC: 32.2 g/dL (ref 30.0–36.0)
MCV: 92 fl (ref 78.0–100.0)
MONOS PCT: 6.8 % (ref 3.0–12.0)
Monocytes Absolute: 0.2 10*3/uL (ref 0.1–1.0)
Neutro Abs: 2.3 10*3/uL (ref 1.4–7.7)
Neutrophils Relative %: 64.5 % (ref 43.0–77.0)
Platelets: 221 10*3/uL (ref 150.0–400.0)
RBC: 4.38 Mil/uL (ref 3.87–5.11)
RDW: 12.9 % (ref 11.5–15.5)
WBC: 3.6 10*3/uL — AB (ref 4.0–10.5)

## 2017-03-03 LAB — IRON,TIBC AND FERRITIN PANEL
%SAT: 28 % (ref 11–50)
Ferritin: 90 ng/mL (ref 10–232)
Iron: 80 ug/dL (ref 45–160)
TIBC: 287 ug/dL (ref 250–450)

## 2017-03-03 LAB — TSH: TSH: 0.57 u[IU]/mL (ref 0.35–4.50)

## 2017-03-03 LAB — BASIC METABOLIC PANEL
BUN: 16 mg/dL (ref 6–23)
CO2: 32 mEq/L (ref 19–32)
Calcium: 9.7 mg/dL (ref 8.4–10.5)
Chloride: 107 mEq/L (ref 96–112)
Creatinine, Ser: 0.8 mg/dL (ref 0.40–1.20)
GFR: 94.61 mL/min (ref >60.00)
GLUCOSE: 147 mg/dL — AB (ref 70–99)
Potassium: 4 mEq/L (ref 3.5–5.1)
SODIUM: 145 meq/L (ref 135–145)

## 2017-03-03 LAB — T4, FREE: Free T4: 0.68 ng/dL (ref 0.60–1.60)

## 2017-03-03 LAB — T3, FREE: T3, Free: 3.2 pg/mL (ref 2.3–4.2)

## 2017-03-03 MED ORDER — LOSARTAN POTASSIUM-HCTZ 100-12.5 MG PO TABS: 1.0000 | ORAL_TABLET | Freq: Every day | ORAL | 3 refills | Status: DC

## 2017-03-03 MED ORDER — ALPRAZOLAM 0.5 MG PO TABS: 0.5000 mg | ORAL_TABLET | Freq: Two times a day (BID) | ORAL | 1 refills | Status: DC | PRN

## 2017-03-03 NOTE — Assessment & Plan Note (Signed)
Chronic problem.  Much better control since increasing the Losartan to 100mg  daily.  Continue the Losartan HCTZ daily.  Check labs.  No anticipated med changes.  Will continue to follow.

## 2017-03-03 NOTE — Patient Instructions (Signed)
Schedule your complete physical for November We'll notify you of your lab results and make any changes if needed YOU ARE RIGHT!  It's Losartan HCTZ 100/12.5mg - I sent the prescription in again Call with any questions or concerns Hang in there! Happy Spring!!!

## 2017-03-03 NOTE — Telephone Encounter (Signed)
Yes- she is taking the Clonazepam for RLS at night and the Alprazolam for anxiety as needed.  Ok to refill

## 2017-03-03 NOTE — Assessment & Plan Note (Signed)
Ongoing issue.  Will get labs that Dr Nelva Bush is requesting

## 2017-03-03 NOTE — Progress Notes (Signed)
Pre visit review using our clinic review tool, if applicable. No additional management support is needed unless otherwise documented below in the visit note. 

## 2017-03-03 NOTE — Telephone Encounter (Signed)
Alprazolam last filled 12/31/16 #60 with 0 Clonazepam last filled 02/27/17 #30 with 0  Please advise, if pt should be on both?

## 2017-03-03 NOTE — Telephone Encounter (Signed)
Patient states she is out of ALPRAZolam (XANAX) 0.5 MG tablet.  She wants to know if pcp wants to take this medication as well as the clonazePAM (KLONOPIN) 0.5 MG tablet.  If so, please send refill to phamarcy.  Patient states she is sorry that she forgot to ask about this during her ov this morning.  Pharamacy:  [facility] [place], [place] [phone] (Phone) [phone] (Fax)

## 2017-03-03 NOTE — Telephone Encounter (Signed)
Medication filled to pharmacy as requested.   

## 2017-03-03 NOTE — Progress Notes (Signed)
   Subjective:    Patient ID: Linda Buckley, female    DOB: Oct 17, 1956, 61 y.o.   MRN: [id number]  HPI HTN- chronic problem.  At last visit, Losartan was increased to 100mg  and HCTZ remained at 12.5mg .  No CP, SOB, HAs, visual changes, edema.  Pt reports home BPs have been 120s/80s.  Neuropathy- chronic problem.  Dr Nelva Bush requesting labs today.   Review of Systems For ROS see HPI     Objective:   Physical Exam  Constitutional: She is oriented to person, place, and time. She appears well-developed and well-nourished. No distress.  HENT:  Head: Normocephalic and atraumatic.  Eyes: Conjunctivae and EOM are normal. Pupils are equal, round, and reactive to light.  Neck: Normal range of motion. Neck supple. No thyromegaly present.  Cardiovascular: Normal rate, regular rhythm, normal heart sounds and intact distal pulses.   No murmur heard. Pulmonary/Chest: Effort normal and breath sounds normal. No respiratory distress.  Abdominal: Soft. She exhibits no distension. There is no tenderness.  Musculoskeletal: She exhibits no edema.  Lymphadenopathy:    She has no cervical adenopathy.  Neurological: She is alert and oriented to person, place, and time.  Skin: Skin is warm and dry.  Psychiatric: She has a normal mood and affect. Her behavior is normal.  Vitals reviewed.         Assessment & Plan:

## 2017-03-04 ENCOUNTER — Other Ambulatory Visit (INDEPENDENT_AMBULATORY_CARE_PROVIDER_SITE_OTHER): Payer: Self-pay

## 2017-03-04 DIAGNOSIS — R7309 Other abnormal glucose: Secondary | ICD-10-CM

## 2017-03-04 LAB — THYROGLOBULIN ANTIBODY: Thyroglobulin Ab: <1 IU/mL (ref <2)

## 2017-03-04 LAB — HEMOGLOBIN A1C: HEMOGLOBIN A1C: 5.9 % (ref 4.6–6.5)

## 2017-03-04 LAB — THYROID PEROXIDASE ANTIBODY: Thyroperoxidase Ab SerPl-aCnc: <1 IU/mL (ref <9)

## 2017-03-05 ENCOUNTER — Encounter: Payer: Self-pay | Admitting: General Practice

## 2017-03-05 ENCOUNTER — Telehealth: Payer: Self-pay | Admitting: Family Medicine

## 2017-03-05 DIAGNOSIS — M79671 Pain in right foot: Secondary | ICD-10-CM

## 2017-03-05 DIAGNOSIS — M79672 Pain in left foot: Principal | ICD-10-CM

## 2017-03-05 NOTE — Telephone Encounter (Signed)
Referral placed.

## 2017-03-05 NOTE — Telephone Encounter (Signed)
[place] for referral to podiatry for foot pain

## 2017-03-05 NOTE — Telephone Encounter (Signed)
Ok for referral?

## 2017-03-05 NOTE — Telephone Encounter (Signed)
Pt states that she forgot to ask for a referral when she can in to a foot doctor regarding corns on the sides of both big toes. They are staring to hurt her when she walks.

## 2017-03-06 ENCOUNTER — Telehealth: Payer: Self-pay | Admitting: Neurology

## 2017-03-06 NOTE — Telephone Encounter (Signed)
Done, thank you!

## 2017-03-06 NOTE — Telephone Encounter (Signed)
Please put patient on Dr. Edwena Felty schedule on 03-31-17 at 10:00 for new CPAP user which is rescheduled form 01-20-17.

## 2017-03-10 ENCOUNTER — Ambulatory Visit (INDEPENDENT_AMBULATORY_CARE_PROVIDER_SITE_OTHER): Payer: BLUE CROSS/BLUE SHIELD | Admitting: Neurology

## 2017-03-10 ENCOUNTER — Encounter: Payer: Self-pay | Admitting: Neurology

## 2017-03-10 ENCOUNTER — Ambulatory Visit: Payer: Self-pay | Admitting: Neurology

## 2017-03-10 VITALS — BP 126/87 | HR 66 | Ht 66.0 in | Wt 251.5 lb

## 2017-03-10 DIAGNOSIS — M797 Fibromyalgia: Secondary | ICD-10-CM | POA: Diagnosis not present

## 2017-03-10 MED ORDER — DULOXETINE HCL 60 MG PO CPEP: 60.0000 mg | ORAL_CAPSULE | Freq: Two times a day (BID) | ORAL | 6 refills | Status: DC

## 2017-03-10 MED ORDER — BUPROPION HCL ER (XL) 150 MG PO TB24: 150.0000 mg | ORAL_TABLET | Freq: Every day | ORAL | 5 refills | Status: DC

## 2017-03-10 NOTE — Progress Notes (Signed)
Reason for visit: Fibromyalgia  Linda Buckley is an 61 y.o. female  History of present illness:  Linda Buckley is a 61 year old right-handed black female with a history of fibromyalgia. The patient has ongoing discomfort in the neck, shoulders, upper chest, arms, and a history of chronic low back pain and leg discomfort. The patient is followed by Dr. Nelva Bush for this, the patient is being evaluated with a trial for a lumbar spinal stimulator. The patient has been seen by Dr. Fredna Dow for the elbow discomfort, she received shots on the right side and she has been placed on Celebrex, and her elbow discomfort has significantly improved over time. The patient remains relatively inactive, she walks with a cane. She does report a fall about 3 weeks ago. The patient returns to office today for an evaluation. The patient is on Lyrica and Cymbalta, she is tolerating these drugs fairly well.  Past Medical History:  Diagnosis Date  . Anxiety   . Arthritis   . Carpal tunnel syndrome of right wrist    RECURRENT  . Depression   . Environmental allergies    dust mites, pollen, roaches and other insects, mold  . Headache    migraines  . Hereditary and idiopathic peripheral neuropathy 04/03/2016  . History of adenomatous polyp of colon   . History of gastric ulcer   . History of kidney stones   . Hypertension   . Pneumonia   . Wears glasses     Past Surgical History:  Procedure Laterality Date  . CARPAL TUNNEL RELEASE Right 2007  . CARPAL TUNNEL RELEASE Right 07/05/2015   Procedure: RIGHT HAND REVISION CARPAL TUNNEL RELEASE;  Surgeon: Iran Planas, MD;  Location: [place];  Service: Orthopedics;  Laterality: Right;  . CARPAL TUNNEL RELEASE Left 09/2016  . CERVICAL DISC ARTHROPLASTY  03/2015  . COLONOSCOPY W/ POLYPECTOMY  04-15-2014  . CYSTO/  RIGHT RETROGRADE PYELOGRAM/ URETEROSCOPY STONE EXTRACTION/  STENT PLACEMENT  06-07-2010  . LAPAROSCOPIC ASSISTED VAGINAL HYSTERECTOMY  04-25-2004   ovaries  remain  . TUBAL LIGATION  1982    Family History  Problem Relation Age of Onset  . Asthma Father   . Arthritis Mother   . Hypertension Mother   . Diabetes Mother   . Cancer Mother   . Diabetes Brother   . Diabetes Brother   . Asthma Son   . Cancer Maternal Grandfather     colon  . Colon cancer Maternal Grandfather   . Cancer Maternal Aunt     breast  . Cancer Maternal Uncle     lung  . Colon cancer Maternal Uncle   . Cancer Maternal Grandmother     ovarian  . Diabetes Son     Social history:  reports that she has never smoked. She has never used smokeless tobacco. She reports that she drinks alcohol. She reports that she does not use drugs.    Allergies  Allergen Reactions  . Aspirin Swelling    Sweating/ swelling of hands and face   . Bee Venom Swelling    Swelling at site     Medications:  Prior to Admission medications   Medication Sig Start Date End Date Taking? Authorizing Provider  ALPRAZolam Duanne Moron) 0.5 MG tablet Take 1 tablet (0.5 mg total) by mouth 2 (two) times daily as needed. for anxiety 03/03/17  Yes Midge Minium, MD  beclomethasone (QVAR) 80 MCG/ACT inhaler Inhale 1 puff into the lungs 2 (two) times daily. 10/21/16  Yes Belenda Cruise  Wadie Lessen, MD  Beclomethasone Diprop HFA (QVAR REDIHALER) 80 MCG/ACT AERB Inhale 1 puff into the lungs 2 (two) times daily. 02/10/17  Yes Midge Minium, MD  celecoxib (CELEBREX) 200 MG capsule TAKE 1 CAPSULE BY MOUTH DAILY WITH FOOD 02/24/17  Yes Historical Provider, MD  Cholecalciferol (VITAMIN D PO) Take 1 tablet by mouth daily.   Yes Historical Provider, MD  clonazePAM (KLONOPIN) 0.5 MG tablet TAKE 1 TABLET BY MOUTH AT BEDTIME 02/27/17  Yes Midge Minium, MD  DULoxetine (CYMBALTA) 60 MG capsule Take 1 capsule (60 mg total) by mouth 2 (two) times daily. 03/10/17  Yes Kathrynn Ducking, MD  EPINEPHrine (EPIPEN) 0.3 mg/0.3 mL SOAJ injection Inject 0.3 mg into the muscle once.   Yes Historical Provider, MD    HYDROcodone-acetaminophen (NORCO/VICODIN) 5-325 MG tablet Take 2 tablets by mouth every 4 (four) hours as needed. 12/06/16  Yes Hollace Kinnier Sofia, PA-C  losartan-hydrochlorothiazide (HYZAAR) 100-12.5 MG tablet Take 1 tablet by mouth daily. 03/03/17  Yes Midge Minium, MD  pregabalin (LYRICA) 100 MG capsule One capsule the morning, 2 in the evening 01/07/17  Yes Kathrynn Ducking, MD  ranitidine (ZANTAC) 300 MG tablet TAKE 1 TABLET(300 MG) BY MOUTH AT BEDTIME 01/07/17  Yes Midge Minium, MD  VENTOLIN HFA 108 (90 Base) MCG/ACT inhaler INHALE 2 PUFFS BY MOUTH EVERY 4 HOURS AS NEEDED FOR SHORTNESS OF BREATH 02/10/17  Yes Midge Minium, MD  buPROPion (WELLBUTRIN XL) 150 MG 24 hr tablet Take 1 tablet (150 mg total) by mouth daily. 03/10/17   Kathrynn Ducking, MD    ROS:  Out of a complete 14 system review of symptoms, the patient complains only of the following symptoms, and all other reviewed systems are negative.  Unexpected weight change Neck pain Memory loss, headache, numbness, weakness  Blood pressure 126/87, pulse 66, height 5\' 6"  (1.676 m), weight 251 lb 8 oz (114.1 kg).  Physical Exam  General: The patient is alert and cooperative at the time of the examination. The patient is markedly obese.  Skin: No significant peripheral edema is noted.   Neurologic Exam  Mental status: The patient is alert and oriented x 3 at the time of the examination. The patient has apparent normal recent and remote memory, with an apparently normal attention span and concentration ability.   Cranial nerves: Facial symmetry is present. Speech is normal, no aphasia or dysarthria is noted. Extraocular movements are full. Visual fields are full.  Motor: The patient has good strength in all 4 extremities.  Sensory examination: Soft touch sensation is symmetric on the face, arms, and legs.  Coordination: The patient has good finger-nose-finger and heel-to-shin bilaterally.  Gait and station: The  patient has a slightly wide-based gait, the patient usually uses a cane for ambulation. Tandem gait is slightly unsteady. Romberg is negative. No drift is seen.  Reflexes: Deep tendon reflexes are symmetric.   Assessment/Plan:  1. Fibromyalgia  2. Chronic low back pain  The patient will be increased on the Cymbalta taking 60 mg twice daily. The Wellbutrin will be cut back to 150 mg daily. The patient is encouraged to remain active and to walk and exercise on a regular basis. She will follow-up in 6 months.  Jill Alexanders MD 03/10/2017 4:35 PM  [facility] [address] [place], [place] [address]  Phone [phone] Fax [phone]

## 2017-03-10 NOTE — Patient Instructions (Signed)
   We will go up on the Cymbalta to 60 mg twice a day and lower the dose of the Wellbutrin to 150 mg daily.

## 2017-03-13 ENCOUNTER — Other Ambulatory Visit: Payer: Self-pay | Admitting: Family Medicine

## 2017-03-17 DIAGNOSIS — Z0289 Encounter for other administrative examinations: Secondary | ICD-10-CM

## 2017-03-26 ENCOUNTER — Telehealth: Payer: Self-pay | Admitting: Neurology

## 2017-03-26 NOTE — Telephone Encounter (Signed)
Patient called office in reference to fibromyalgia.  Patient states both of her feet, hands, and body is tight and wants to be sure if this is common for fibromyalgia.  Patient took sleeping medications and it did not help her rest her entire body is tighter than normal.  Symptoms are progressing over a week.  Per patient she has not done anything unusual to cause this problem.  Please call

## 2017-03-26 NOTE — Telephone Encounter (Signed)
I called patient. The patient indicates that she has had swelling in the legs, feet, and hands and has had tightness associated with this. The patient is on Celebrex and Lyrica which can promote fluid retention. If the swelling is severe, I have recommended that she go see her primary care physician. The patient believes that it may be related to exposure to pollen allergies, she believes that the symptoms are improving at this point.

## 2017-03-31 ENCOUNTER — Ambulatory Visit: Payer: Self-pay | Admitting: Neurology

## 2017-04-01 ENCOUNTER — Telehealth: Payer: Self-pay

## 2017-04-01 NOTE — Telephone Encounter (Signed)
LM for patient to call back and r/s Sleep Consult appt with Dr. Brett Fairy. Appt cancelled due to bad weather.

## 2017-04-02 ENCOUNTER — Other Ambulatory Visit: Payer: Self-pay | Admitting: Family Medicine

## 2017-04-02 NOTE — Telephone Encounter (Signed)
Medication filled to pharmacy as requested.   

## 2017-04-02 NOTE — Telephone Encounter (Signed)
Last OV 03/03/17 Clonazepam last filled 02/27/17

## 2017-04-09 ENCOUNTER — Ambulatory Visit: Payer: Self-pay | Admitting: Family Medicine

## 2017-04-11 ENCOUNTER — Ambulatory Visit (INDEPENDENT_AMBULATORY_CARE_PROVIDER_SITE_OTHER): Payer: BLUE CROSS/BLUE SHIELD | Admitting: Family Medicine

## 2017-04-11 ENCOUNTER — Encounter: Payer: Self-pay | Admitting: Family Medicine

## 2017-04-11 VITALS — BP 122/86 | HR 76 | Temp 98.2°F | Resp 17 | Ht 66.0 in | Wt 250.4 lb

## 2017-04-11 DIAGNOSIS — R600 Localized edema: Secondary | ICD-10-CM | POA: Diagnosis not present

## 2017-04-11 LAB — CBC WITH DIFFERENTIAL/PLATELET
Basophils Absolute: 0 10*3/uL (ref 0.0–0.1)
Basophils Relative: 0.7 % (ref 0.0–3.0)
EOS PCT: 3.4 % (ref 0.0–5.0)
Eosinophils Absolute: 0.1 10*3/uL (ref 0.0–0.7)
HCT: 38.8 % (ref 36.0–46.0)
HEMOGLOBIN: 12.7 g/dL (ref 12.0–15.0)
Lymphocytes Relative: 23.9 % (ref 12.0–46.0)
Lymphs Abs: 1 10*3/uL (ref 0.7–4.0)
MCHC: 32.6 g/dL (ref 30.0–36.0)
MCV: 90.7 fl (ref 78.0–100.0)
MONO ABS: 0.4 10*3/uL (ref 0.1–1.0)
MONOS PCT: 9.4 % (ref 3.0–12.0)
Neutro Abs: 2.5 10*3/uL (ref 1.4–7.7)
Neutrophils Relative %: 62.6 % (ref 43.0–77.0)
Platelets: 225 10*3/uL (ref 150.0–400.0)
RBC: 4.28 Mil/uL (ref 3.87–5.11)
RDW: 13.9 % (ref 11.5–15.5)
WBC: 4.1 10*3/uL (ref 4.0–10.5)

## 2017-04-11 LAB — TSH: TSH: 1.28 u[IU]/mL (ref 0.35–4.50)

## 2017-04-11 LAB — BASIC METABOLIC PANEL
BUN: 18 mg/dL (ref 6–23)
CALCIUM: 9.7 mg/dL (ref 8.4–10.5)
CO2: 31 mEq/L (ref 19–32)
CREATININE: 0.84 mg/dL (ref 0.40–1.20)
Chloride: 105 mEq/L (ref 96–112)
GFR: 89.4 mL/min (ref >60.00)
Glucose, Bld: 102 mg/dL — ABNORMAL HIGH (ref 70–99)
Potassium: 3.8 mEq/L (ref 3.5–5.1)
Sodium: 142 mEq/L (ref 135–145)

## 2017-04-11 LAB — HEPATIC FUNCTION PANEL
ALBUMIN: 4.3 g/dL (ref 3.5–5.2)
ALT: 21 U/L (ref 0–35)
AST: 18 U/L (ref 0–37)
Alkaline Phosphatase: 74 U/L (ref 39–117)
Bilirubin, Direct: 0.1 mg/dL (ref 0.0–0.3)
Total Bilirubin: 0.6 mg/dL (ref 0.2–1.2)
Total Protein: 6.9 g/dL (ref 6.0–8.3)

## 2017-04-11 MED ORDER — FUROSEMIDE 20 MG PO TABS: 20.0000 mg | ORAL_TABLET | Freq: Every day | ORAL | 3 refills | Status: DC

## 2017-04-11 NOTE — Patient Instructions (Signed)
Follow up in 2 weeks to recheck swelling We'll notify you of your lab results and make any changes if needed Continue to work on healthy diet and regular exercise- I'm proud of you!!! Make sure you are drinking plenty of water Start the Furosemide (Lasix) daily to improve swelling Limit your salt intake, elevate your legs, and wear compression socks to improve swelling Call with any questions or concerns Have a great weekend!

## 2017-04-11 NOTE — Progress Notes (Signed)
   Subjective:    Patient ID: Linda Buckley, female    DOB: Jun 01, 1956, 61 y.o.   MRN: [id number]  HPI Swelling- pt reports she is having swelling in both feet that progressively worsens throughout the day.  Feet get tight and painful as they swell.  Swelling will extend just above the ankles bilaterally.  No change in hand swelling or SOB.  Swelling started 2.5 weeks ago.  Pt's Cymbalta was doubled on 3/26.  Walking more.  Denies dietary changes.   Review of Systems For ROS see HPI     Objective:   Physical Exam  Constitutional: She is oriented to person, place, and time. She appears well-developed and well-nourished. No distress.  HENT:  Head: Normocephalic and atraumatic.  Eyes: Conjunctivae and EOM are normal. Pupils are equal, round, and reactive to light.  Neck: Normal range of motion. Neck supple. No thyromegaly present.  Cardiovascular: Normal rate, regular rhythm, normal heart sounds and intact distal pulses.   No murmur heard. Pulmonary/Chest: Effort normal and breath sounds normal. No respiratory distress.  Abdominal: Soft. She exhibits no distension. There is no tenderness.  Musculoskeletal: She exhibits edema (1+ edema of feet bilaterally). She exhibits no tenderness.  Lymphadenopathy:    She has no cervical adenopathy.  Neurological: She is alert and oriented to person, place, and time.  Skin: Skin is warm and dry. No erythema.  Psychiatric: She has a normal mood and affect. Her behavior is normal.  Vitals reviewed.         Assessment & Plan:  Edema- new.  Suspect this is due to her recent doubling of Cymbalta but must r/o metabolic causes of swelling.  Check TSH, CBC, BMP, LFTs.  Reviewed need for increased fluids, low salt diet, elevation, compression hose.  Reviewed supportive care and red flags that should prompt return.  Pt expressed understanding and is in agreement w/ plan.

## 2017-04-11 NOTE — Progress Notes (Signed)
Pre visit review using our clinic review tool, if applicable. No additional management support is needed unless otherwise documented below in the visit note. 

## 2017-04-12 ENCOUNTER — Other Ambulatory Visit: Payer: Self-pay | Admitting: Family Medicine

## 2017-04-14 ENCOUNTER — Encounter: Payer: Self-pay | Admitting: General Practice

## 2017-04-25 ENCOUNTER — Ambulatory Visit (INDEPENDENT_AMBULATORY_CARE_PROVIDER_SITE_OTHER): Payer: BLUE CROSS/BLUE SHIELD | Admitting: Family Medicine

## 2017-04-25 ENCOUNTER — Encounter: Payer: Self-pay | Admitting: Family Medicine

## 2017-04-25 VITALS — BP 120/83 | HR 79 | Temp 98.3°F | Resp 16 | Ht 66.0 in | Wt 247.5 lb

## 2017-04-25 DIAGNOSIS — R600 Localized edema: Secondary | ICD-10-CM

## 2017-04-25 LAB — BASIC METABOLIC PANEL
BUN: 17 mg/dL (ref 6–23)
CHLORIDE: 107 meq/L (ref 96–112)
CO2: 29 mEq/L (ref 19–32)
CREATININE: 0.86 mg/dL (ref 0.40–1.20)
Calcium: 9.7 mg/dL (ref 8.4–10.5)
GFR: 86.99 mL/min (ref >60.00)
GLUCOSE: 141 mg/dL — AB (ref 70–99)
POTASSIUM: 3.8 meq/L (ref 3.5–5.1)
Sodium: 144 mEq/L (ref 135–145)

## 2017-04-25 NOTE — Progress Notes (Signed)
   Subjective:    Patient ID: Linda Buckley, female    DOB: 12-Aug-1956, 61 y.o.   MRN: [id number]  HPI Edema- started on Lasix 20mg  at last visit.  Pt has lost 3 lbs since last visit and pt is very pleased that feet and ankles are much less swollen.  BP is stable.  Denies CP, SOB, visual changes.   Review of Systems For ROS see HPI     Objective:   Physical Exam  Constitutional: She is oriented to person, place, and time. She appears well-developed and well-nourished. No distress.  HENT:  Head: Normocephalic and atraumatic.  Eyes: Conjunctivae and EOM are normal. Pupils are equal, round, and reactive to light.  Neck: Normal range of motion. Neck supple. No thyromegaly present.  Cardiovascular: Normal rate, regular rhythm, normal heart sounds and intact distal pulses.   No murmur heard. Pulmonary/Chest: Effort normal and breath sounds normal. No respiratory distress.  Abdominal: Soft. She exhibits no distension. There is no tenderness.  Musculoskeletal: She exhibits no edema.  Lymphadenopathy:    She has no cervical adenopathy.  Neurological: She is alert and oriented to person, place, and time.  Skin: Skin is warm and dry.  Psychiatric: She has a normal mood and affect. Her behavior is normal.  Vitals reviewed.         Assessment & Plan:  Edema- much improved since adding Lasix.  Check BMP and adjust meds prn.  Encouraged increased fluid and potassium intake in the setting of diuretics.  Pt expressed understanding and is in agreement w/ plan.

## 2017-04-25 NOTE — Patient Instructions (Signed)
Schedule your complete physical for November We'll notify you of your lab results and make any changes if needed Continue to work on healthy diet and regular exercise- you can do it!!! Call with any questions or concerns Happy Mother's Day!!!

## 2017-04-25 NOTE — Progress Notes (Signed)
Pre visit review using our clinic review tool, if applicable. No additional management support is needed unless otherwise documented below in the visit note. 

## 2017-04-28 ENCOUNTER — Ambulatory Visit: Payer: Self-pay | Admitting: Family Medicine

## 2017-04-28 ENCOUNTER — Other Ambulatory Visit: Payer: Self-pay | Admitting: Family Medicine

## 2017-04-28 ENCOUNTER — Encounter: Payer: Self-pay | Admitting: General Practice

## 2017-04-28 NOTE — Telephone Encounter (Signed)
Because the Cymbalta is being used at a maximum dose of 60 mg twice daily, we cut back on Wellbutrin to minimize risk of serotonin syndrome. I would keep her at the 150 mg dose. She should have plenty medication to continue this dose for the next 5 months.

## 2017-04-28 NOTE — Telephone Encounter (Signed)
Please advise on 03/10/17 Dr. Jannifer Franklin cut this back to 150mg ?

## 2017-04-28 NOTE — Telephone Encounter (Signed)
Will defer refills to Dr Jannifer Franklin as he has been adjusting meds

## 2017-05-14 ENCOUNTER — Other Ambulatory Visit: Payer: Self-pay | Admitting: Family Medicine

## 2017-05-14 NOTE — Telephone Encounter (Signed)
Last OV 04/25/17 Alprazolam last filled 03/03/17 #60 with 1

## 2017-05-14 NOTE — Telephone Encounter (Signed)
Medication filled to pharmacy as requested.   

## 2017-05-20 ENCOUNTER — Ambulatory Visit: Payer: Self-pay | Admitting: Neurology

## 2017-05-21 ENCOUNTER — Ambulatory Visit (INDEPENDENT_AMBULATORY_CARE_PROVIDER_SITE_OTHER): Payer: BLUE CROSS/BLUE SHIELD | Admitting: Adult Health

## 2017-05-21 ENCOUNTER — Encounter (INDEPENDENT_AMBULATORY_CARE_PROVIDER_SITE_OTHER): Payer: Self-pay

## 2017-05-21 ENCOUNTER — Encounter: Payer: Self-pay | Admitting: Neurology

## 2017-05-21 ENCOUNTER — Encounter: Payer: Self-pay | Admitting: Adult Health

## 2017-05-21 VITALS — BP 127/84 | HR 70 | Ht 66.0 in | Wt 216.6 lb

## 2017-05-21 DIAGNOSIS — M797 Fibromyalgia: Secondary | ICD-10-CM | POA: Diagnosis not present

## 2017-05-21 NOTE — Progress Notes (Signed)
I have read the note, and I agree with the clinical assessment and plan.  Oluwatosin Higginson KEITH   

## 2017-05-21 NOTE — Patient Instructions (Signed)
Continue Cymbalta and Lyrica  Referral to pain management  If your symptoms worsen or you develop new symptoms please let us know.

## 2017-05-21 NOTE — Progress Notes (Signed)
PATIENT: Linda Buckley DOB: 08-20-1956  REASON FOR VISIT: follow up-fibromyalgia HISTORY FROM: patient  HISTORY OF PRESENT ILLNESS: Linda Buckley is a 61 year old female with a history of fibromyalgia. She returns today for follow-up. She states initially the increase in Cymbalta was beneficial however her pain has returned. She reports burning in the feet bilaterally. She has sharp shooting pains in the top of the feet and in the legs. She has ongoing chronic low back pain. She reports discomfort in the neck and shoulders and upper chest. She is currently taking Lyrica as well. In the past she has been on Vicodin and the reports that it did not offer her any benefit. She is also tried physical therapy as well as water therapy and reports that the benefit is only temporary. She reports that she does try to stretch daily. Reports that she does have swelling in the lower extremities. She was placed on Lasix and told to wear compression stockings. She continues to have some swelling. She returns today for an evaluation.  HISTORY 03/10/17: Linda Buckley is a 61 year old right-handed black female with a history of fibromyalgia. The patient has ongoing discomfort in the neck, shoulders, upper chest, arms, and a history of chronic low back pain and leg discomfort. The patient is followed by Dr. Nelva Bush for this, the patient is being evaluated with a trial for a lumbar spinal stimulator. The patient has been seen by Dr. Fredna Dow for the elbow discomfort, she received shots on the right side and she has been placed on Celebrex, and her elbow discomfort has significantly improved over time. The patient remains relatively inactive, she walks with a cane. She does report a fall about 3 weeks ago. The patient returns to office today for an evaluation. The patient is on Lyrica and Cymbalta, she is tolerating these drugs fairly well.   REVIEW OF SYSTEMS: Out of a complete 14 system review of symptoms, the patient  complains only of the following symptoms, and all other reviewed systems are negative.  Shortness of breath, chest tightness, leg swelling, blurred vision, fatigue, joint pain, back pain, aching muscles, walking difficulty, neck pain, daytime sleepiness, snoring, sleep talking, acting out dreams, restless leg, rash, nervous/anxious, depression, confusion  ALLERGIES: Allergies  Allergen Reactions  . Aspirin Swelling    Sweating/ swelling of hands and face   . Bee Venom Swelling    Swelling at site     HOME MEDICATIONS: Outpatient Medications Prior to Visit  Medication Sig Dispense Refill  . ALPRAZolam (XANAX) 0.5 MG tablet TAKE 1 TABLET BY MOUTH TWICE DAILY AS NEEDED FOR ANXIETY 60 tablet 0  . beclomethasone (QVAR) 80 MCG/ACT inhaler Inhale 1 puff into the lungs 2 (two) times daily. 1 Inhaler 3  . Beclomethasone Diprop HFA (QVAR REDIHALER) 80 MCG/ACT AERB Inhale 1 puff into the lungs 2 (two) times daily. 1 Inhaler 3  . buPROPion (WELLBUTRIN XL) 150 MG 24 hr tablet Take 1 tablet (150 mg total) by mouth daily. 30 tablet 5  . celecoxib (CELEBREX) 200 MG capsule TAKE 1 CAPSULE BY MOUTH DAILY WITH FOOD    . Cholecalciferol (VITAMIN D PO) Take 1 tablet by mouth daily. 1000units    . DULoxetine (CYMBALTA) 60 MG capsule Take 1 capsule (60 mg total) by mouth 2 (two) times daily. 60 capsule 6  . EPINEPHrine (EPIPEN) 0.3 mg/0.3 mL SOAJ injection Inject 0.3 mg into the muscle once.    . furosemide (LASIX) 20 MG tablet Take 1 tablet (20 mg total)  by mouth daily. 30 tablet 3  . losartan-hydrochlorothiazide (HYZAAR) 100-12.5 MG tablet Take 1 tablet by mouth daily. 90 tablet 3  . pregabalin (LYRICA) 100 MG capsule One capsule the morning, 2 in the evening 270 capsule 1  . ranitidine (ZANTAC) 300 MG tablet TAKE 1 TABLET(300 MG) BY MOUTH AT BEDTIME 30 tablet 6  . VENTOLIN HFA 108 (90 Base) MCG/ACT inhaler INHALE 2 PUFFS BY MOUTH EVERY 4 HOURS AS NEEDED FOR SHORTNESS OF BREATH 36 g 3  .  HYDROcodone-acetaminophen (NORCO/VICODIN) 5-325 MG tablet Take 2 tablets by mouth every 4 (four) hours as needed. (Patient not taking: Reported on 05/21/2017) 12 tablet 0   No facility-administered medications prior to visit.     PAST MEDICAL HISTORY: Past Medical History:  Diagnosis Date  . Anxiety   . Arthritis   . Carpal tunnel syndrome of right wrist    RECURRENT  . Depression   . Environmental allergies    dust mites, pollen, roaches and other insects, mold  . Headache    migraines  . Hereditary and idiopathic peripheral neuropathy 04/03/2016  . History of adenomatous polyp of colon   . History of gastric ulcer   . History of kidney stones   . Hypertension   . Pneumonia   . Wears glasses     PAST SURGICAL HISTORY: Past Surgical History:  Procedure Laterality Date  . CARPAL TUNNEL RELEASE Right 2007  . CARPAL TUNNEL RELEASE Right 07/05/2015   Procedure: RIGHT HAND REVISION CARPAL TUNNEL RELEASE;  Surgeon: Iran Planas, MD;  Location: [place];  Service: Orthopedics;  Laterality: Right;  . CARPAL TUNNEL RELEASE Left 09/2016  . CERVICAL DISC ARTHROPLASTY  03/2015  . COLONOSCOPY W/ POLYPECTOMY  04-15-2014  . CYSTO/  RIGHT RETROGRADE PYELOGRAM/ URETEROSCOPY STONE EXTRACTION/  STENT PLACEMENT  06-07-2010  . LAPAROSCOPIC ASSISTED VAGINAL HYSTERECTOMY  04-25-2004   ovaries remain  . TUBAL LIGATION  1982    FAMILY HISTORY: Family History  Problem Relation Age of Onset  . Asthma Father   . Arthritis Mother   . Hypertension Mother   . Diabetes Mother   . Cancer Mother   . Diabetes Brother   . Diabetes Brother   . Asthma Son   . Cancer Maternal Grandfather        colon  . Colon cancer Maternal Grandfather   . Cancer Maternal Aunt        breast  . Cancer Maternal Uncle        lung  . Colon cancer Maternal Uncle   . Cancer Maternal Grandmother        ovarian  . Diabetes Son     SOCIAL HISTORY: Social History   Social History  . Marital status: Married    Spouse  name: N/A  . Number of children: 2  . Years of education: 2 yrs college   Occupational History  . [employer]     Dance movement psychotherapist   Social History Main Topics  . Smoking status: Never Smoker  . Smokeless tobacco: Never Used  . Alcohol use Yes     Comment: 2 glasses of wine weekly  . Drug use: No  . Sexual activity: Not on file   Other Topics Concern  . Not on file   Social History Narrative   Lives at home w/ her husband   Right-handed   Drinks 2 cups of coffee weekly    3 bottle water per day         PHYSICAL EXAM  Vitals:   05/21/17 1326  BP: 127/84  Pulse: 70  Weight: 216 lb 9.6 oz (98.2 kg)  Height: 5\' 6"  (1.676 m)   Body mass index is 34.96 kg/m.  Generalized: Well developed, in no acute distress   Neurological examination  Mentation: Alert oriented to time, place, history taking. Follows all commands speech and language fluent Cranial nerve II-XII: Pupils were equal round reactive to light. Extraocular movements were full, visual field were full on confrontational test. Facial sensation and strength were normal. Uvula tongue midline. Head turning and shoulder shrug  were normal and symmetric. Motor: The motor testing reveals 5 over 5 strength of all 4 extremities. Good symmetric motor tone is noted throughout.  Sensory: Sensory testing is intact to soft touch on all 4 extremities. No evidence of extinction is noted.  Coordination: Cerebellar testing reveals good finger-nose-finger and heel-to-shin bilaterally.  Gait and station: Patient uses a cane when ambulating. Gait is slightly unsteady. Tandem gait not attended.   DIAGNOSTIC DATA (LABS, IMAGING, TESTING) - I reviewed patient records, labs, notes, testing and imaging myself where available.  Lab Results  Component Value Date   WBC 4.1 04/11/2017   HGB 12.7 04/11/2017   HCT 38.8 04/11/2017   MCV 90.7 04/11/2017   PLT 225.0 04/11/2017      Component Value Date/Time   NA 144 04/25/2017 1125   K 3.8  04/25/2017 1125   CL 107 04/25/2017 1125   CO2 29 04/25/2017 1125   GLUCOSE 141 (H) 04/25/2017 1125   BUN 17 04/25/2017 1125   CREATININE 0.86 04/25/2017 1125   CREATININE 0.78 04/22/2016 1454   CALCIUM 9.7 04/25/2017 1125   PROT 6.9 04/11/2017 0919   PROT 6.6 04/03/2016 1018   ALBUMIN 4.3 04/11/2017 0919   AST 18 04/11/2017 0919   ALT 21 04/11/2017 0919   ALKPHOS 74 04/11/2017 0919   BILITOT 0.6 04/11/2017 0919   GFRNONAA >60 02/13/2016 0955   GFRAA >60 02/13/2016 0955   Lab Results  Component Value Date   CHOL 182 10/28/2016   HDL 66.40 10/28/2016   LDLCALC 94 10/28/2016   TRIG 109.0 10/28/2016   CHOLHDL 3 10/28/2016   Lab Results  Component Value Date   HGBA1C 5.9 03/04/2017   Lab Results  Component Value Date   VITAMINB12 293 04/03/2016   Lab Results  Component Value Date   TSH 1.28 04/11/2017      ASSESSMENT AND PLAN 61 y.o. year old female  has a past medical history of Anxiety; Arthritis; Carpal tunnel syndrome of right wrist; Depression; Environmental allergies; Headache; Hereditary and idiopathic peripheral neuropathy (04/03/2016); History of adenomatous polyp of colon; History of gastric ulcer; History of kidney stones; Hypertension; Pneumonia; and Wears glasses. here with:  1. Fibromyalgia  Patient is currently taking Cymbalta 60 mg twice a day as well as Lyrica 100 mg in the morning and 200 mg in the evening. The patient continues to have ongoing pain. She was just recently placed on a muscle relaxer. At this time I think the patient would benefit from pain management. She is amenable to this plan. I will place a referral today. She will continue Cymbalta and Lyrica. She will follow-up in 6 months or sooner if needed.  I spent 15 minutes with the patient 50% of this time was spent discussing pain management.    Ward Givens, MSN, NP-C 05/21/2017, 1:39 PM [facility] [address] [place],  [address] [phone]

## 2017-05-22 ENCOUNTER — Telehealth: Payer: Self-pay | Admitting: Family Medicine

## 2017-05-22 NOTE — Telephone Encounter (Signed)
Patient states she saw Dr. Jannifer Franklin' PA, [place] yesterday.  Due to the swelling in her legs even with wearing compression socks, Jinny Blossom suggested patient contact pcp to have the dosage of her fluid pill increased and to also add potassium to her medication regimen.  Pharmacy:  [facility] [place], [place] [phone] (Phone) [phone] (Fax)

## 2017-05-22 NOTE — Telephone Encounter (Signed)
Did you receive an office note on this?

## 2017-05-23 NOTE — Telephone Encounter (Signed)
After reviewing the Neuro note- they mention pain management referral but did not suggest changing fluid pill or anything about potassium (potassium was normal at last check)

## 2017-05-23 NOTE — Telephone Encounter (Signed)
Patient notified of PCP recommendations and is agreement and expresses an understanding.  

## 2017-05-23 NOTE — Telephone Encounter (Signed)
I did not make any changes to her Lasix. She mentioned in the office visit that she was still have swelling in the LE despite being put on lasix.  I advised that a side effect of Lyrica is swelling in extremeties. For that reason we would not increase it but rather get her to pain management. So sorry for any confusion this may have caused.

## 2017-05-23 NOTE — Telephone Encounter (Signed)
Please advise, per Dr. Birdie Riddle she did not see any mention of fluid pill changes?

## 2017-05-27 ENCOUNTER — Other Ambulatory Visit: Payer: Self-pay | Admitting: Family Medicine

## 2017-05-27 DIAGNOSIS — Z139 Encounter for screening, unspecified: Secondary | ICD-10-CM

## 2017-05-30 ENCOUNTER — Ambulatory Visit: Payer: Self-pay | Admitting: Physician Assistant

## 2017-06-02 ENCOUNTER — Encounter: Payer: Self-pay | Admitting: Family Medicine

## 2017-06-02 ENCOUNTER — Other Ambulatory Visit: Payer: Self-pay | Admitting: Family Medicine

## 2017-06-02 ENCOUNTER — Ambulatory Visit
Admission: RE | Admit: 2017-06-02 | Discharge: 2017-06-02 | Disposition: A | Discharge status: Home / Self Care | Payer: BLUE CROSS/BLUE SHIELD | Source: Ambulatory Visit | Attending: Family Medicine | Admitting: Family Medicine

## 2017-06-02 ENCOUNTER — Ambulatory Visit (INDEPENDENT_AMBULATORY_CARE_PROVIDER_SITE_OTHER): Payer: BLUE CROSS/BLUE SHIELD | Admitting: Family Medicine

## 2017-06-02 DIAGNOSIS — R21 Rash and other nonspecific skin eruption: Secondary | ICD-10-CM | POA: Diagnosis not present

## 2017-06-02 DIAGNOSIS — Z139 Encounter for screening, unspecified: Secondary | ICD-10-CM

## 2017-06-02 MED ORDER — CLOTRIMAZOLE-BETAMETHASONE 1-0.05 % EX CREA: 1.0000 "application " | TOPICAL_CREAM | Freq: Two times a day (BID) | CUTANEOUS | 1 refills | Status: DC

## 2017-06-02 NOTE — Progress Notes (Signed)
Pre visit review using our clinic review tool, if applicable. No additional management support is needed unless otherwise documented below in the visit note. 

## 2017-06-02 NOTE — Progress Notes (Signed)
   Subjective:    Patient ID: Linda Buckley, female    DOB: [birth year], 61 y.o.   MRN: [id number]  HPI Rash- L upper arm.  Pt reports it has been present for 2-3 months.  Doesn't itch but pt reports area is spreading.  Red, small bumps.  Not painful.  Husband w/o anything similar.  No new meds or lotions/soaps/perfumes/etc   Review of Systems For ROS see HPI     Objective:   Physical Exam  Constitutional: She is oriented to person, place, and time. She appears well-developed and well-nourished. No distress.  obese  HENT:  Head: Normocephalic and atraumatic.  Neurological: She is alert and oriented to person, place, and time.  Skin: Skin is warm and dry. There is erythema (well circumscribed erythematous macules (3) on L upper arm).  Psychiatric: She has a normal mood and affect. Her behavior is normal. Thought content normal.  Vitals reviewed.         Assessment & Plan:  Rash- new.  Areas are consistent w/ hives vs ringworm.  Start Lotrisone cream BID.  Reviewed supportive care and red flags that should prompt return.  Pt expressed understanding and is in agreement w/ plan.

## 2017-06-02 NOTE — Patient Instructions (Signed)
Follow up as needed/scheduled Start the Lotrisone cream twice daily Start daily Claritin or Zyrtec to help prevent/treat hives Call with any questions or concerns Hang in there!!!

## 2017-06-09 ENCOUNTER — Ambulatory Visit: Payer: BLUE CROSS/BLUE SHIELD | Admitting: Neurology

## 2017-06-09 MED FILL — OXYCOD/ACETAMINOPHEN 5-325M: 5-325 | 4 days supply | Qty: 24 | Fill #0

## 2017-06-26 ENCOUNTER — Other Ambulatory Visit: Payer: Self-pay | Admitting: General Practice

## 2017-06-26 MED ORDER — ALPRAZOLAM 0.5 MG PO TABS: 0.5000 mg | ORAL_TABLET | Freq: Two times a day (BID) | ORAL | 0 refills | Status: DC | PRN

## 2017-06-26 NOTE — Telephone Encounter (Signed)
Last OV 06/02/17  (rash) Alprazolam last filled 05/14/17

## 2017-06-26 NOTE — Telephone Encounter (Signed)
Medication filled to pharmacy as requested.   

## 2017-06-29 ENCOUNTER — Other Ambulatory Visit: Payer: Self-pay | Admitting: Family Medicine

## 2017-07-03 ENCOUNTER — Ambulatory Visit (INDEPENDENT_AMBULATORY_CARE_PROVIDER_SITE_OTHER): Payer: BLUE CROSS/BLUE SHIELD | Admitting: Family Medicine

## 2017-07-03 ENCOUNTER — Encounter: Payer: Self-pay | Admitting: Family Medicine

## 2017-07-03 VITALS — BP 121/81 | HR 68 | Temp 98.6°F | Resp 16 | Ht 66.0 in | Wt 250.5 lb

## 2017-07-03 DIAGNOSIS — G4700 Insomnia, unspecified: Secondary | ICD-10-CM

## 2017-07-03 DIAGNOSIS — R251 Tremor, unspecified: Secondary | ICD-10-CM | POA: Diagnosis not present

## 2017-07-03 DIAGNOSIS — R5383 Other fatigue: Secondary | ICD-10-CM | POA: Diagnosis not present

## 2017-07-03 LAB — CBC WITH DIFFERENTIAL/PLATELET
BASOS PCT: 0.5 % (ref 0.0–3.0)
Basophils Absolute: 0 10*3/uL (ref 0.0–0.1)
EOS ABS: 0.1 10*3/uL (ref 0.0–0.7)
Eosinophils Relative: 3.4 % (ref 0.0–5.0)
HCT: 38.8 % (ref 36.0–46.0)
HEMOGLOBIN: 12.4 g/dL (ref 12.0–15.0)
Lymphocytes Relative: 27.1 % (ref 12.0–46.0)
Lymphs Abs: 1 10*3/uL (ref 0.7–4.0)
MCHC: 31.9 g/dL (ref 30.0–36.0)
MCV: 92.5 fl (ref 78.0–100.0)
MONO ABS: 0.2 10*3/uL (ref 0.1–1.0)
Monocytes Relative: 5.9 % (ref 3.0–12.0)
NEUTROS PCT: 63.1 % (ref 43.0–77.0)
Neutro Abs: 2.3 10*3/uL (ref 1.4–7.7)
PLATELETS: 254 10*3/uL (ref 150.0–400.0)
RBC: 4.2 Mil/uL (ref 3.87–5.11)
RDW: 13.3 % (ref 11.5–15.5)
WBC: 3.7 10*3/uL — AB (ref 4.0–10.5)

## 2017-07-03 LAB — IBC PANEL
IRON: 59 ug/dL (ref 42–145)
Saturation Ratios: 17.6 % — ABNORMAL LOW (ref 20.0–50.0)
TRANSFERRIN: 240 mg/dL (ref 212.0–360.0)

## 2017-07-03 LAB — B12 AND FOLATE PANEL
Folate: 10.6 ng/mL (ref >5.9)
Vitamin B-12: 427 pg/mL (ref 211–911)

## 2017-07-03 LAB — TSH: TSH: 0.75 u[IU]/mL (ref 0.35–4.50)

## 2017-07-03 LAB — BASIC METABOLIC PANEL
BUN: 18 mg/dL (ref 6–23)
CALCIUM: 9.5 mg/dL (ref 8.4–10.5)
CO2: 30 mEq/L (ref 19–32)
Chloride: 110 mEq/L (ref 96–112)
Creatinine, Ser: 0.89 mg/dL (ref 0.40–1.20)
GFR: 83.56 mL/min (ref >60.00)
GLUCOSE: 142 mg/dL — AB (ref 70–99)
Potassium: 3.9 mEq/L (ref 3.5–5.1)
SODIUM: 147 meq/L — AB (ref 135–145)

## 2017-07-03 LAB — VITAMIN D 25 HYDROXY (VIT D DEFICIENCY, FRACTURES): VITD: 26.98 ng/mL — ABNORMAL LOW (ref 30.00–100.00)

## 2017-07-03 MED ORDER — TRAZODONE HCL 50 MG PO TABS: 25.0000 mg | ORAL_TABLET | Freq: Every evening | ORAL | 3 refills | Status: DC | PRN

## 2017-07-03 NOTE — Progress Notes (Signed)
Pre visit review using our clinic review tool, if applicable. No additional management support is needed unless otherwise documented below in the visit note. 

## 2017-07-03 NOTE — Progress Notes (Signed)
   Subjective:    Patient ID: Linda Buckley, female    DOB: 05/21/1956, 61 y.o.   MRN: [id number]  HPI Fatigue- 'i've been having the shakes.  My head hurts.  This morning I had a stomach ache'.  sxs started 1-2 weeks ago.  'shakes' come and go- mostly in hands/feet and particularly when holding things.  Pt reports appetite is 'funny'  Pt is not sleeping well at night- using CPAP but can't get comfortable.  Pt reports trouble is both falling and staying asleep.   Review of Systems For ROS see HPI     Objective:   Physical Exam  Constitutional: She is oriented to person, place, and time. She appears well-developed and well-nourished. No distress.  obese  HENT:  Head: Normocephalic and atraumatic.  Eyes: Pupils are equal, round, and reactive to light. Conjunctivae and EOM are normal.  Neck: Normal range of motion. Neck supple. No thyromegaly present.  Cardiovascular: Normal rate, regular rhythm, normal heart sounds and intact distal pulses.   No murmur heard. Pulmonary/Chest: Effort normal and breath sounds normal. No respiratory distress.  Abdominal: Soft. She exhibits no distension. There is no tenderness.  Musculoskeletal: She exhibits no edema.  Lymphadenopathy:    She has no cervical adenopathy.  Neurological: She is alert and oriented to person, place, and time.  Fine tremor of R hand  Skin: Skin is warm and dry.  Psychiatric: She has a normal mood and affect. Her behavior is normal.  Vitals reviewed.         Assessment & Plan:  Fatigue- recurrent problem for pt.  She admits to poor sleep- both difficulty falling and staying asleep.  Using CPAP regularly.  Start Trazodone and monitor for improvement.  Check labs to r/o metabolic cause or vitamin deficiency.  Will follow.  Tremor- fine tremor of R hand visible in office today.  May be due to her fatigue or may be essential tremor.  Encouraged her to discuss w/ Dr Jannifer Franklin if sxs persist  Insomnia- new.  Pt having  difficulty both falling asleep and staying asleep.  Start Trazodone as this is not controlled and not habit forming.  Reviewed supportive care and red flags that should prompt return.  Pt expressed understanding and is in agreement w/ plan.

## 2017-07-03 NOTE — Patient Instructions (Signed)
Follow up as needed/scheduled We'll notify you of your lab results and make any changes if needed Start Trazodone nightly for sleep- start w/ 1/2 tab and increase to 1 tab if needed If the tremor continues, please discuss with Dr Jannifer Franklin Drink plenty of fluids and make sure you are eating regularly, even when you don't feel like it Call with any questions or concerns Hang in there!!!

## 2017-07-04 ENCOUNTER — Other Ambulatory Visit: Payer: Self-pay | Admitting: General Practice

## 2017-07-04 ENCOUNTER — Other Ambulatory Visit (INDEPENDENT_AMBULATORY_CARE_PROVIDER_SITE_OTHER): Payer: BLUE CROSS/BLUE SHIELD

## 2017-07-04 DIAGNOSIS — R7309 Other abnormal glucose: Secondary | ICD-10-CM

## 2017-07-04 LAB — HEMOGLOBIN A1C: HEMOGLOBIN A1C: 6 % (ref 4.6–6.5)

## 2017-07-04 MED ORDER — VITAMIN D (ERGOCALCIFEROL) 1.25 MG (50000 UNIT) PO CAPS: 50000.0000 [IU] | ORAL_CAPSULE | ORAL | 0 refills | Status: DC

## 2017-07-04 NOTE — Progress Notes (Signed)
Called pt and lmovm to return call.

## 2017-07-08 ENCOUNTER — Telehealth: Payer: Self-pay | Admitting: Family Medicine

## 2017-07-08 NOTE — Telephone Encounter (Signed)
Please advise 

## 2017-07-08 NOTE — Telephone Encounter (Signed)
Patient states she seen pain management doctor in [facility] today.  He has advised her she can no longer take ALPRAZolam (XANAX) 0.5 MG tablet in addition to her pain meds he is prescribing.  She is requesting an alternate medication for anxiety.  Pharmacy:  [facility] [place], [place] [phone] (Phone) [phone] (Fax)

## 2017-07-09 NOTE — Telephone Encounter (Signed)
Pt is taking the medication once in the morning (every day) and occasionally at night. Please advise?

## 2017-07-09 NOTE — Telephone Encounter (Signed)
Please ask pt how frequently she is taking this as I don't want to stop abruptly and cause withdrawal sxs

## 2017-07-09 NOTE — Telephone Encounter (Signed)
Called pt to find out how often she is taking her medication. Left a detailed message to return call.

## 2017-07-09 NOTE — Telephone Encounter (Signed)
Decrease the pill to 1/2 tab in the AM and try and not take it at night.  After 1-2 weeks of 1/2 tab daily, try stopping the medication

## 2017-07-09 NOTE — Telephone Encounter (Signed)
Called pt and left a detailed message as requested.

## 2017-08-04 ENCOUNTER — Other Ambulatory Visit: Payer: Self-pay | Admitting: Family Medicine

## 2017-08-04 NOTE — Telephone Encounter (Signed)
Last OV 07/03/17 Alprazolam last filled 06/26/17 #60 with 0

## 2017-08-05 NOTE — Telephone Encounter (Signed)
Medication filled to pharmacy as requested.   

## 2017-08-08 ENCOUNTER — Ambulatory Visit (INDEPENDENT_AMBULATORY_CARE_PROVIDER_SITE_OTHER): Payer: BLUE CROSS/BLUE SHIELD | Admitting: Family Medicine

## 2017-08-08 ENCOUNTER — Encounter: Payer: Self-pay | Admitting: Family Medicine

## 2017-08-08 VITALS — BP 126/86 | HR 79 | Temp 98.2°F | Resp 16 | Ht 66.0 in | Wt 256.5 lb

## 2017-08-08 DIAGNOSIS — F4323 Adjustment disorder with mixed anxiety and depressed mood: Secondary | ICD-10-CM | POA: Diagnosis not present

## 2017-08-08 MED ORDER — BUPROPION HCL ER (XL) 300 MG PO TB24: 300.0000 mg | ORAL_TABLET | Freq: Every day | ORAL | 3 refills | Status: DC

## 2017-08-08 NOTE — Patient Instructions (Signed)
Follow up in 3-4 weeks to recheck mood Increase the Bupropion (Wellbutrin) to 300mg  daily.  This is 2 of what you have at home and 1 of the new prescription (at the pharmacy for you) Continue your Cymbalta Please consider counseling to help you through this difficult time- it would be hard for anyone! Call with any questions or concerns Hang in there!  You've got this!

## 2017-08-08 NOTE — Assessment & Plan Note (Signed)
Deteriorated.  Pt reports that she has lost hope, she cries 'all the time'.  She reports that sxs have been worsening since her Wellbutrin was decreased in March and then she came off her xanax and things further worsened.  Increase Wellbutrin back to 300mg  and follow closely.  If no improvement, will add Buspar.  Pt expressed understanding and is in agreement w/ plan.

## 2017-08-08 NOTE — Progress Notes (Signed)
Pre visit review using our clinic review tool, if applicable. No additional management support is needed unless otherwise documented below in the visit note. 

## 2017-08-08 NOTE — Progress Notes (Signed)
   Subjective:    Patient ID: Linda Buckley, female    DOB: 1956/05/23, 61 y.o.   MRN: [id number]  HPI Anxiety- ongoing issue for pt and worsening since pt weaned off alprazolam.  On Wellbutrin (but pt doesn't remember this) and cymbalta.  Pt reports she 'cries all the time.  i'm very sensitive'.  Pt reports her hope is gone, 'everything is negative'. Pt was weaned from 300mg  to 150mg  of Wellbutrin by Dr Jannifer Franklin.    Review of Systems For ROS see HPI     Objective:   Physical Exam  Constitutional: She is oriented to person, place, and time. She appears well-developed and well-nourished. No distress.  Neurological: She is alert and oriented to person, place, and time.  Skin: Skin is warm and dry.  Psychiatric: Her behavior is normal. Thought content normal.  Flat affect.  Withdrawn today  Vitals reviewed.         Assessment & Plan:

## 2017-08-13 ENCOUNTER — Other Ambulatory Visit: Payer: Self-pay | Admitting: Family Medicine

## 2017-08-14 ENCOUNTER — Encounter: Payer: Self-pay | Admitting: Adult Health

## 2017-08-14 ENCOUNTER — Ambulatory Visit (INDEPENDENT_AMBULATORY_CARE_PROVIDER_SITE_OTHER): Payer: BLUE CROSS/BLUE SHIELD | Admitting: Adult Health

## 2017-08-14 VITALS — BP 123/88 | HR 88 | Wt 254.0 lb

## 2017-08-14 DIAGNOSIS — M797 Fibromyalgia: Secondary | ICD-10-CM | POA: Diagnosis not present

## 2017-08-14 NOTE — Patient Instructions (Addendum)
Your Plan:  Continue follow-up with pain management Consider exercise program- referral to [facility] Continue Lyrica and Cymbalta If your symptoms worsen or you develop new symptoms please let us know.   Thank you for coming to see [name] at [facility]. I hope we have been able to provide you high quality care today.  You may receive a patient satisfaction survey over the next few weeks. We would appreciate your feedback and comments so that we may continue to improve ourselves and the health of our patients.

## 2017-08-14 NOTE — Progress Notes (Signed)
PATIENT: Linda Buckley DOB: 02-20-1956  REASON FOR VISIT: follow up- fibromyalgia HISTORY FROM: patient  HISTORY OF PRESENT ILLNESS: Linda Buckley is a 61 year old female with a history of fibromyalgia. She returns today for follow-up. She reports that she did see a pain specialist in [place]. She was placed on Percocet. She reports that Percocet is helpful although it does make her feel confused and drowsy. She ultimately does not want to be on this medication long-term. She remains on Lyrica and Cymbalta. She states that she has pain all over. Going down  both arms and down the back. She has seen a spine specialist at [facility] who recommended a spinal stimulator however she is still trying to get this approved. She states because of her increased pain, she is having more depression. Her primary care has recommended that she see a Social worker. She states in the past she has tried water aerobics but is unsure that it made her pain better. [facility] and did find this beneficial although she needs a new prescription in order for insurance to cover this. She returns today for an evaluation.   HISTORY 05/21/17: Linda Buckley is a 61 year old female with a history of fibromyalgia. She returns today for follow-up. She states initially the increase in Cymbalta was beneficial however her pain has returned. She reports burning in the feet bilaterally. She has sharp shooting pains in the top of the feet and in the legs. She has ongoing chronic low back pain. She reports discomfort in the neck and shoulders and upper chest. She is currently taking Lyrica as well. In the past she has been on Vicodin and the reports that it did not offer her any benefit. She is also tried physical therapy as well as water therapy and reports that the benefit is only temporary. She reports that she does try to stretch daily. Reports that she does have swelling in the lower extremities. She was placed on  Lasix and told to wear compression stockings. She continues to have some swelling. She returns today for an evaluation.  REVIEW OF SYSTEMS: Out of a complete 14 system review of symptoms, the patient complains only of the following symptoms, and all other reviewed systems are negative.  Facial swelling, eye pain, swollen abdomen, difficulty urinating, joint pain, joint swelling, neck pain, nervous/anxious, weakness, swollen lymph nodes, daytime  ALLERGIES: Allergies  Allergen Reactions  . Aspirin Swelling    Sweating/ swelling of hands and face   . Bee Venom Swelling    Swelling at site     HOME MEDICATIONS: Outpatient Medications Prior to Visit  Medication Sig Dispense Refill  . buPROPion (WELLBUTRIN XL) 300 MG 24 hr tablet Take 1 tablet (300 mg total) by mouth daily. 30 tablet 3  . Cholecalciferol (VITAMIN D PO) Take 1 tablet by mouth daily. 1000units    . clotrimazole-betamethasone (LOTRISONE) cream Apply 1 application topically 2 (two) times daily. 45 g 1  . DULoxetine (CYMBALTA) 60 MG capsule Take 1 capsule (60 mg total) by mouth 2 (two) times daily. 60 capsule 6  . EPINEPHrine (EPIPEN) 0.3 mg/0.3 mL SOAJ injection Inject 0.3 mg into the muscle once.    . furosemide (LASIX) 20 MG tablet TAKE 1 TABLET(20 MG) BY MOUTH DAILY 30 tablet 3  . losartan-hydrochlorothiazide (HYZAAR) 100-12.5 MG tablet TAKE 1 TABLET BY MOUTH DAILY 30 tablet 6  . oxyCODONE-acetaminophen (PERCOCET/ROXICET) 5-325 MG tablet   0  . pregabalin (LYRICA) 100 MG capsule One  capsule the morning, 2 in the evening 270 capsule 1  . QVAR REDIHALER 80 MCG/ACT inhaler INHALE 1 PUFF BY MOUTH TWICE DAILY 10.6 g 3  . ranitidine (ZANTAC) 300 MG tablet TAKE 1 TABLET(300 MG) BY MOUTH AT BEDTIME 30 tablet 6  . traZODone (DESYREL) 50 MG tablet Take 0.5-1 tablets (25-50 mg total) by mouth at bedtime as needed for sleep. 30 tablet 3  . VENTOLIN HFA 108 (90 Base) MCG/ACT inhaler INHALE 2 PUFFS BY MOUTH EVERY 4 HOURS AS NEEDED FOR  SHORTNESS OF BREATH 36 g 3   No facility-administered medications prior to visit.     PAST MEDICAL HISTORY: Past Medical History:  Diagnosis Date  . Anxiety   . Arthritis   . Carpal tunnel syndrome of right wrist    RECURRENT  . Depression   . Environmental allergies    dust mites, pollen, roaches and other insects, mold  . Headache    migraines  . Hereditary and idiopathic peripheral neuropathy 04/03/2016  . History of adenomatous polyp of colon   . History of gastric ulcer   . History of kidney stones   . Hypertension   . Pneumonia   . Wears glasses     PAST SURGICAL HISTORY: Past Surgical History:  Procedure Laterality Date  . CARPAL TUNNEL RELEASE Right 2007  . CARPAL TUNNEL RELEASE Right 07/05/2015   Procedure: RIGHT HAND REVISION CARPAL TUNNEL RELEASE;  Surgeon: Iran Planas, MD;  Location: [facility];  Service: Orthopedics;  Laterality: Right;  . CARPAL TUNNEL RELEASE Left 09/2016  . CERVICAL DISC ARTHROPLASTY  03/2015  . COLONOSCOPY W/ POLYPECTOMY  04-15-2014  . CYSTO/  RIGHT RETROGRADE PYELOGRAM/ URETEROSCOPY STONE EXTRACTION/  STENT PLACEMENT  06-07-2010  . LAPAROSCOPIC ASSISTED VAGINAL HYSTERECTOMY  04-25-2004   ovaries remain  . TUBAL LIGATION  1982    FAMILY HISTORY: Family History  Problem Relation Age of Onset  . Asthma Father   . Arthritis Mother   . Hypertension Mother   . Diabetes Mother   . Cancer Mother   . Diabetes Brother   . Diabetes Brother   . Asthma Son   . Cancer Maternal Grandfather        colon  . Colon cancer Maternal Grandfather   . Cancer Maternal Aunt        breast  . Cancer Maternal Uncle        lung  . Colon cancer Maternal Uncle   . Cancer Maternal Grandmother        ovarian  . Diabetes Son     SOCIAL HISTORY: Social History   Social History  . Marital status: Married    Spouse name: N/A  . Number of children: 2  . Years of education: 2 yrs college   Occupational History  . [employer]     Dance movement psychotherapist   Social  History Main Topics  . Smoking status: Never Smoker  . Smokeless tobacco: Never Used  . Alcohol use Yes     Comment: 2 glasses of wine weekly  . Drug use: No  . Sexual activity: Not on file   Other Topics Concern  . Not on file   Social History Narrative   Lives at home w/ her husband   Right-handed   Drinks 2 cups of coffee weekly    3 bottle water per day         PHYSICAL EXAM  Vitals:   08/14/17 0719  BP: 123/88  Pulse: 88  Weight: 254 lb (  115.2 kg)   Body mass index is 41 kg/m.  Generalized: Well developed, in no acute distress   Neurological examination  Mentation: Alert oriented to time, place, history taking. Follows all commands speech and language fluent Cranial nerve II-XII: Pupils were equal round reactive to light. Extraocular movements were full, visual field were full on confrontational test. Facial sensation and strength were normal. Uvula tongue midline. Head turning and shoulder shrug  were normal and symmetric. Motor: The motor testing reveals 5 over 5 strength of all 4 extremities. Good symmetric motor tone is noted throughout.  Sensory: Sensory testing is intact to soft touch on all 4 extremities. No evidence of extinction is noted.  Coordination: Cerebellar testing reveals good finger-nose-finger and heel-to-shin bilaterally.  Gait and station: Gait is slightly unsteady. Tandem gait not attempted. Reflexes: Deep tendon reflexes are symmetric and normal bilaterally.   DIAGNOSTIC DATA (LABS, IMAGING, TESTING) - I reviewed patient records, labs, notes, testing and imaging myself where available.  Lab Results  Component Value Date   WBC 3.7 (L) 07/03/2017   HGB 12.4 07/03/2017   HCT 38.8 07/03/2017   MCV 92.5 07/03/2017   PLT 254.0 07/03/2017      Component Value Date/Time   NA 147 (H) 07/03/2017 1009   K 3.9 07/03/2017 1009   CL 110 07/03/2017 1009   CO2 30 07/03/2017 1009   GLUCOSE 142 (H) 07/03/2017 1009   BUN 18 07/03/2017 1009    CREATININE 0.89 07/03/2017 1009   CREATININE 0.78 04/22/2016 1454   CALCIUM 9.5 07/03/2017 1009   PROT 6.9 04/11/2017 0919   PROT 6.6 04/03/2016 1018   ALBUMIN 4.3 04/11/2017 0919   AST 18 04/11/2017 0919   ALT 21 04/11/2017 0919   ALKPHOS 74 04/11/2017 0919   BILITOT 0.6 04/11/2017 0919   GFRNONAA >60 02/13/2016 0955   GFRAA >60 02/13/2016 0955   Lab Results  Component Value Date   CHOL 182 10/28/2016   HDL 66.40 10/28/2016   LDLCALC 94 10/28/2016   TRIG 109.0 10/28/2016   CHOLHDL 3 10/28/2016   Lab Results  Component Value Date   HGBA1C 6.0 07/04/2017   Lab Results  Component Value Date   VITAMINB12 427 07/03/2017   Lab Results  Component Value Date   TSH 0.75 07/03/2017      ASSESSMENT AND PLAN 61 y.o. year old female  has a past medical history of Anxiety; Arthritis; Carpal tunnel syndrome of right wrist; Depression; Environmental allergies; Headache; Hereditary and idiopathic peripheral neuropathy (04/03/2016); History of adenomatous polyp of colon; History of gastric ulcer; History of kidney stones; Hypertension; Pneumonia; and Wears glasses. here with;  1. Fibromyalgia  I advised the patient that I was unsure that adding any additional medication to her regimen would offer her any benefit. She verbalized understanding. Also, she does not want to be on any additional medication. I advised that with fibromyalgia, exercising usually is good therapy. I will write a prescription for [facility] where she [facility] benefit before. Advised that she should keep keep regular follow-ups with her pain specialist. She will continue on Lyrica and Cymbalta at this time. She will follow-up in 6 months with Dr. Jannifer Franklin.  I spent 25 minutes with the patient. 50% of this time was spent Discussing treatment options for fibromyalgia.    Ward Givens, MSN, NP-C 08/14/2017, 7:27 AM [facility] [address], [place],  [place] [address] [phone]

## 2017-08-19 ENCOUNTER — Telehealth: Payer: Self-pay | Admitting: *Deleted

## 2017-08-19 ENCOUNTER — Telehealth: Payer: Self-pay | Admitting: Family Medicine

## 2017-08-19 NOTE — Telephone Encounter (Signed)
Have you seen this note?

## 2017-08-19 NOTE — Telephone Encounter (Signed)
Went to [facility] last week(08/13/17) and was seen by Dr. Deetta Perla.  He should have sent ov note toDr. Birdie Riddle. She wants to make sure Dr. Birdie Riddle sent note to her pain management doctor in [facility].

## 2017-08-19 NOTE — Telephone Encounter (Signed)
Received fax that pt has appt scheduled with [facility] 08-29-17 at 1600. [phone], fx [phone].

## 2017-08-20 NOTE — Telephone Encounter (Signed)
There is an 'initial consult note' in [place] that we are able to view.  However, since this is not my note, I don't feel comfortable printing and sending to another provider

## 2017-08-20 NOTE — Telephone Encounter (Signed)
Noted  

## 2017-08-21 NOTE — Progress Notes (Signed)
I have reviewed and agreed above plan. 

## 2017-08-25 ENCOUNTER — Other Ambulatory Visit: Payer: Self-pay | Admitting: Adult Health

## 2017-08-25 MED ORDER — PREGABALIN 100 MG PO CAPS: ORAL_CAPSULE | ORAL | 0 refills | Status: DC

## 2017-08-25 NOTE — Telephone Encounter (Signed)
Prescription printed and signed

## 2017-08-25 NOTE — Addendum Note (Signed)
Addended by: Trudie Buckler on: 08/25/2017 04:24 PM   Modules accepted: Orders

## 2017-08-25 NOTE — Telephone Encounter (Signed)
Patient called office requesting refill for pregabalin (LYRICA) 100 MG capsule.  Pharmacy- [facility] [place].

## 2017-08-25 NOTE — Telephone Encounter (Signed)
Tramadol refill Rx faxed to [facility], [place].

## 2017-08-25 NOTE — Telephone Encounter (Signed)
Pt called back, she's been out of the medication for 5 days and is starting to feel a crawling sensation. Please call her when RX has been sent in.

## 2017-09-01 ENCOUNTER — Encounter: Payer: Self-pay | Admitting: Neurology

## 2017-09-01 ENCOUNTER — Ambulatory Visit: Payer: Self-pay | Admitting: Family Medicine

## 2017-09-02 ENCOUNTER — Ambulatory Visit (INDEPENDENT_AMBULATORY_CARE_PROVIDER_SITE_OTHER): Payer: BLUE CROSS/BLUE SHIELD | Admitting: Cardiovascular Disease

## 2017-09-02 ENCOUNTER — Encounter: Payer: Self-pay | Admitting: Cardiovascular Disease

## 2017-09-02 VITALS — BP 120/82 | HR 71 | Ht 65.0 in | Wt 251.4 lb

## 2017-09-02 DIAGNOSIS — R1310 Dysphagia, unspecified: Secondary | ICD-10-CM | POA: Diagnosis not present

## 2017-09-02 DIAGNOSIS — R0789 Other chest pain: Secondary | ICD-10-CM | POA: Diagnosis not present

## 2017-09-02 DIAGNOSIS — I10 Essential (primary) hypertension: Secondary | ICD-10-CM | POA: Diagnosis not present

## 2017-09-02 DIAGNOSIS — R1319 Other dysphagia: Secondary | ICD-10-CM

## 2017-09-02 DIAGNOSIS — R609 Edema, unspecified: Secondary | ICD-10-CM

## 2017-09-02 HISTORY — DX: Dysphagia, unspecified: R13.10

## 2017-09-02 HISTORY — DX: Edema, unspecified: R60.9

## 2017-09-02 NOTE — Patient Instructions (Addendum)
Medication Instructions:  Your physician recommends that you continue on your current medications as directed. Please refer to the Current Medication list given to you today.  Labwork: none  Testing/Procedures: none  Follow-Up: As needed   You have been referred to [facility]. If you do not hear from the office you may call them directly at Phone: [phone]  Address: [address], [place], [place] [address]   If you need a refill on your cardiac medications before your next appointment, please call your pharmacy.

## 2017-09-02 NOTE — Progress Notes (Signed)
Cardiology Office Note   Date:  09/02/2017   ID:  [name], DOB 05/22/1956, MRN [id number]  PCP:  Midge Minium, MD  Cardiologist:   Skeet Latch, MD   No chief complaint on file.    History of Present Illness: Linda Buckley is a 61 y.o. female with hypertension, anxiety, depression, OSA on CPAP, and fibromyalgia who presents for follow up.  She saw [place] 02/2017 with a complaint of chest pain.  At that appointment she also reported exertional dyspnea. His thought to be somewhat atypical. However she does have a family history of CAD. She was referred for [facility] 02/21/17 that revealed LVEF 55% and no ischemia.  She reports that it sometimes feels like food gets stuck.  This causes similar chest pain.  However she also has the chest pain when she is not eating.  It occurs 1-2 times per week.  It is sharp pain either under her sternum or in her R>L breast.  There is associated shortness of breath and nausea but no diaphoresis.  She has been experiencing hot flashes, but this is separate from her chest pain. She has been taking ranitidine for over a year without any improvement in her symptoms. Therefore she stopped the medication 2 weeks ago. She notes some lower extremity edema that improves with elevation.  She denies orthopnea or PND. [name] walks for exercise around [place]. She is afraid to walk for further distances because her knees sometimes give out.  She is scheduled to start physical therapy soon.  Past Medical History:  Diagnosis Date  . Anxiety   . Arthritis   . Carpal tunnel syndrome of right wrist    RECURRENT  . Depression   . Dysphagia 09/02/2017  . Edema 09/02/2017  . Environmental allergies    dust mites, pollen, roaches and other insects, mold  . Headache    migraines  . Hereditary and idiopathic peripheral neuropathy 04/03/2016  . History of adenomatous polyp of colon   . History of gastric ulcer   .  History of kidney stones   . Hypertension   . Pneumonia   . Wears glasses     Past Surgical History:  Procedure Laterality Date  . CARPAL TUNNEL RELEASE Right 2007  . CARPAL TUNNEL RELEASE Right 07/05/2015   Procedure: RIGHT HAND REVISION CARPAL TUNNEL RELEASE;  Surgeon: Iran Planas, MD;  Location: [facility];  Service: Orthopedics;  Laterality: Right;  . CARPAL TUNNEL RELEASE Left 09/2016  . CERVICAL DISC ARTHROPLASTY  03/2015  . COLONOSCOPY W/ POLYPECTOMY  04-15-2014  . CYSTO/  RIGHT RETROGRADE PYELOGRAM/ URETEROSCOPY STONE EXTRACTION/  STENT PLACEMENT  06-07-2010  . LAPAROSCOPIC ASSISTED VAGINAL HYSTERECTOMY  04-25-2004   ovaries remain  . TUBAL LIGATION  1982     Current Outpatient Prescriptions  Medication Sig Dispense Refill  . buPROPion (WELLBUTRIN XL) 300 MG 24 hr tablet Take 1 tablet (300 mg total) by mouth daily. 30 tablet 3  . Cholecalciferol (VITAMIN D PO) Take 1 tablet by mouth daily. 1000units    . clotrimazole-betamethasone (LOTRISONE) cream Apply 1 application topically 2 (two) times daily. 45 g 1  . DULoxetine (CYMBALTA) 60 MG capsule Take 1 capsule (60 mg total) by mouth 2 (two) times daily. 60 capsule 6  . EPINEPHrine (EPIPEN) 0.3 mg/0.3 mL SOAJ injection Inject 0.3 mg into the muscle once.    . furosemide (LASIX) 20 MG tablet TAKE 1 TABLET(20 MG) BY MOUTH DAILY 30  tablet 3  . losartan-hydrochlorothiazide (HYZAAR) 100-12.5 MG tablet TAKE 1 TABLET BY MOUTH DAILY 30 tablet 6  . oxyCODONE-acetaminophen (PERCOCET/ROXICET) 5-325 MG tablet   0  . pregabalin (LYRICA) 100 MG capsule One capsule the morning, 2 in the evening 270 capsule 0  . QVAR REDIHALER 80 MCG/ACT inhaler INHALE 1 PUFF BY MOUTH TWICE DAILY 10.6 g 3  . ranitidine (ZANTAC) 300 MG tablet TAKE 1 TABLET(300 MG) BY MOUTH AT BEDTIME 30 tablet 6  . traZODone (DESYREL) 50 MG tablet Take 0.5-1 tablets (25-50 mg total) by mouth at bedtime as needed for sleep. 30 tablet 3  . VENTOLIN HFA 108 (90 Base) MCG/ACT inhaler  INHALE 2 PUFFS BY MOUTH EVERY 4 HOURS AS NEEDED FOR SHORTNESS OF BREATH 36 g 3   No current facility-administered medications for this visit.     Allergies:   Aspirin and Bee venom    Social History:  The patient  reports that she has never smoked. She has never used smokeless tobacco. She reports that she drinks alcohol. She reports that she does not use drugs.   Family History:  The patient's family history includes Arthritis in her mother; Asthma in her father and son; Cancer in her maternal aunt, maternal grandfather, maternal grandmother, maternal uncle, and mother; Colon cancer in her maternal grandfather and maternal uncle; Diabetes in her brother, brother, father, mother, and son; Hypertension in her mother; Stomach cancer in her mother.    ROS:  Please see the history of present illness.   Otherwise, review of systems are positive for none.   All other systems are reviewed and negative.    PHYSICAL EXAM: VS:  BP 120/82   Pulse 71   Ht 5\' 5"  (1.651 m)   Wt 114 kg (251 lb 6.4 oz)   BMI 41.84 kg/m  , BMI Body mass index is 41.84 kg/m. GENERAL:  Well appearing HEENT:  Pupils equal round and reactive, fundi not visualized, oral mucosa unremarkable NECK:  No jugular venous distention, waveform within normal limits, carotid upstroke brisk and symmetric, no bruits, no thyromegaly LYMPHATICS:  No cervical adenopathy LUNGS:  Clear to auscultation bilaterally HEART:  RRR.  PMI not displaced or sustained,S1 and S2 within normal limits, no S3, no S4, no clicks, no rubs, no murmurs ABD:  Flat, positive bowel sounds normal in frequency in pitch, no bruits, no rebound, no guarding, no midline pulsatile mass, no hepatomegaly, no splenomegaly EXT:  2 plus pulses throughout, no edema, no cyanosis no clubbing SKIN:  No rashes no nodules NEURO:  Cranial nerves II through XII grossly intact, motor grossly intact throughout PSYCH:  Cognitively intact, oriented to person place and time    EKG:   EKG is ordered today. The ekg ordered today demonstrates this rhythm. Rate 71 bpm.  LAFB.     Lexiscan Myoview 02/21/17: The left ventricular ejection fraction is normal (55-65%).  Nuclear stress EF: 55%.  There was no ST segment deviation noted during stress.  No T wave inversion was noted during stress.  The study is normal.  This is a low risk study.   Recent Labs: 04/11/2017: ALT 21 07/03/2017: BUN 18; Creatinine, Ser 0.89; Hemoglobin 12.4; Platelets 254.0; Potassium 3.9; Sodium 147; TSH 0.75    Lipid Panel    Component Value Date/Time   CHOL 182 10/28/2016 1443   TRIG 109.0 10/28/2016 1443   HDL 66.40 10/28/2016 1443   CHOLHDL 3 10/28/2016 1443   VLDL 21.8 10/28/2016 1443   LDLCALC 94 10/28/2016  1443      Wt Readings from Last 3 Encounters:  09/02/17 114 kg (251 lb 6.4 oz)  08/14/17 115.2 kg (254 lb)  08/08/17 116.3 kg (256 lb 8 oz)      ASSESSMENT AND PLAN:  # Atypical chest pain: Lexiscan Myoview it sounds as though there is also a component of dysphagia. Refer her to GI for consideration of an EGD. Her symptoms are not cardiac. They have not improved with ranitidine.  # Edema: No edema on exam today and she does not have heart failure symptoms. Is more related to obesity and venous insufficiency.    Current medicines are reviewed at length with the patient today.  The patient does not have concerns regarding medicines.  The following changes have been made:  no change  Labs/ tests ordered today include:   Orders Placed This Encounter  Procedures  . Ambulatory referral to Gastroenterology  . EKG 12-Lead     Disposition:   FU with Javonta Gronau C. Oval Linsey, MD, [facility] as needed.     This note was written with the assistance of speech recognition software.  Please excuse any transcriptional errors.  Signed, Ziggy Chanthavong C. Oval Linsey, MD, [facility]  09/02/2017 12:30 PM    [facility]

## 2017-09-03 ENCOUNTER — Ambulatory Visit (INDEPENDENT_AMBULATORY_CARE_PROVIDER_SITE_OTHER): Payer: BLUE CROSS/BLUE SHIELD | Admitting: Neurology

## 2017-09-03 ENCOUNTER — Encounter: Payer: Self-pay | Admitting: Neurology

## 2017-09-03 VITALS — BP 116/81 | HR 72 | Ht 65.0 in | Wt 254.0 lb

## 2017-09-03 DIAGNOSIS — G4733 Obstructive sleep apnea (adult) (pediatric): Secondary | ICD-10-CM

## 2017-09-03 DIAGNOSIS — F329 Major depressive disorder, single episode, unspecified: Secondary | ICD-10-CM | POA: Diagnosis not present

## 2017-09-03 DIAGNOSIS — Z9989 Dependence on other enabling machines and devices: Secondary | ICD-10-CM | POA: Diagnosis not present

## 2017-09-03 NOTE — Patient Instructions (Signed)

## 2017-09-03 NOTE — Progress Notes (Signed)
SLEEP MEDICINE CLINIC   Provider:  Larey Seat, M D  Referring Provider: Midge Minium, MD Primary Care Physician:  Midge Minium, MD    Interval history from 09/03/2017, Linda Buckley is established as a patient of Dr. Floyde Parkins and has seen Ward Givens in the last 2 visits with our office. She was referred by Dr. Birdie Riddle for a sleep study which I had ordered a split-night protocol. The study was performed on 11/03/2016 after the patient had endorsed an Epworth sleepiness score of 20 points. BMI at the time was 40 neck circumference however was only 14 inches. The patient presented with an AHI of 25.5 did not have hypoxemia, periodic limb movements, or abnormal tachybradycardia responses. Her sleep latency was 88.5 minutes indicating that it was hard for her to go to sleep and her sleep efficiency was only 58% indicating that she had both forms of insomnia. She was titrated to CPAP at a pressure of 6.0 cm water and this was enough to completely alleviate all apnea. In the meantime she states that she is sleeping better, no longer has nocturia and why she still has aches and pains her sleep has become more sound and less fragmented. She endorsed no the Epworth sleepiness score at 13 points and the fatigue severity score at 55 points. I'm also able today to see the compliance with CPAP, is 90% compliance bedtime EPR is 3 cm on a 6 cm setting, this is a very low pressure but her AHI has been reduced to 2.6 and she rarely has air leaks. Given the clinical benefits, the patient should continue CPAP at exactly the same settings she's on now.  Bedtime is 9-10 PM, she rises at 6 AM.  She lost weight without dieting.   HPI:  Linda Buckley is a female, afro -[place], right handed 61 y.o. female , seen here as a referral  from Dr. Jannifer Franklin for a sleep evaluation and possible sleep study,  " I wake up -  because of my body hurting " Linda Buckley reports that she his snoring, wheezing,  weight gain, fatigue, swelling in the lower extremities, a tendency to bruise easily, feels excessively sleepy, has restless legs, not enough sleep depression and anxiety, change in that site, is interested in some activities that used to give her pleasure, slurring of speech headaches confusion. She endorsed migraine depression anxiety and fibromyalgia in her past medical history as well as back surgeries she had a fusion anterior fusion cervical spine 12/04/2015, carpal tunnel, hysterectomy.  He was first told couple of years ago that she snored and was surprised. In the meantime her snoring has much increased in volume but this is one of the reasons why she is here.  Sleep habits are as follows:She goes to bed usually at 9:30 but watches TV in bed, usually watches TV news and roughly by 10:30 he will be asleep. She will switch the TV off and the bedroom will be call, quiet and dark. She is very restless and has no favorite sleep position, she also has a lot of discomfort in various positions. She uses 2 pillows between the legs, one under the right arm and 1 for neck and head support. About 1 AM she will wake and feel discomfort, not the urge to urinate. She rises in the morning at 6:45 AM, and still feels fatigued. She often has to do some stretching sitting for while. She may get 6 hours of sleep at the most overnight.  Sleep medical history and family sleep history:  Brother has insomnia, PTSD. OSA.   Social history: Mrs. Hoggard was married to a [name] service member and stationed in various continence and countries. She also attended [employer]. The patient has never used tobacco, she seldomly drinks wine, no hard liquor. She drinks coffee daily and she takes one cup in the morning and one cup at night, she reports she experiences a paradox  reaction to caffeine  "Dr Jannifer Franklin patient - last note: Linda Buckley is a 61 year old right-handed black female with a history of fibromyalgia, and a prior  history of cervical spine surgery. The patient has ongoing discomfort at the base of the neck and shoulders. The patient has significant neuromuscular pain throughout. She is having exercise intolerance, she has difficulty walking, she may feel as if her knees will collapse. The patient may have sharp pains on the top and bottom of the feet, she has had a mild peripheral neuropathy documented by nerve conduction studies. She has had carpal tunnel syndrome previously. She gets somewhat short winded when she tries to walk. She reports a lot of excessive daytime drowsiness, she is unable to drive longer distances because she gets so drowsy, she will fall asleep if she is inactive. She has significant foot pain with walking. She is on Lyrica and Cymbalta. These medications are not controlling her symptoms. She has had neuromuscular pain for over 5 years."   Review of Systems: Out of a complete 14 system review, the patient complains of only the following symptoms, and all other reviewed systems are negative.   Epworth score  13 from 20 , Fatigue severity score  55 from 63 points  since on CPAP  , depression score 3/15 .    Social History   Social History  . Marital status: Married    Spouse name: N/A  . Number of children: 2  . Years of education: 2 yrs college   Occupational History  . [employer]     Dance movement psychotherapist   Social History Main Topics  . Smoking status: Never Smoker  . Smokeless tobacco: Never Used  . Alcohol use Yes     Comment: 2 glasses of wine weekly  . Drug use: No  . Sexual activity: Not on file   Other Topics Concern  . Not on file   Social History Narrative   Lives at home w/ her husband   Right-handed   Drinks 2 cups of coffee weekly    3 bottle water per day       Family History  Problem Relation Age of Onset  . Asthma Father   . Diabetes Father   . Arthritis Mother   . Hypertension Mother   . Diabetes Mother   . Cancer Mother   . Stomach cancer Mother     . Diabetes Brother   . Diabetes Brother   . Asthma Son   . Cancer Maternal Grandfather        colon  . Colon cancer Maternal Grandfather   . Cancer Maternal Aunt        breast  . Cancer Maternal Uncle        lung  . Colon cancer Maternal Uncle   . Cancer Maternal Grandmother        ovarian  . Diabetes Son     Past Medical History:  Diagnosis Date  . Anxiety   . Arthritis   . Carpal tunnel syndrome of right wrist  RECURRENT  . Depression   . Dysphagia 09/02/2017  . Edema 09/02/2017  . Environmental allergies    dust mites, pollen, roaches and other insects, mold  . Headache    migraines  . Hereditary and idiopathic peripheral neuropathy 04/03/2016  . History of adenomatous polyp of colon   . History of gastric ulcer   . History of kidney stones   . Hypertension   . Pneumonia   . Wears glasses     Past Surgical History:  Procedure Laterality Date  . CARPAL TUNNEL RELEASE Right 2007  . CARPAL TUNNEL RELEASE Right 07/05/2015   Procedure: RIGHT HAND REVISION CARPAL TUNNEL RELEASE;  Surgeon: Iran Planas, MD;  Location: [facility];  Service: Orthopedics;  Laterality: Right;  . CARPAL TUNNEL RELEASE Left 09/2016  . CERVICAL DISC ARTHROPLASTY  03/2015  . COLONOSCOPY W/ POLYPECTOMY  04-15-2014  . CYSTO/  RIGHT RETROGRADE PYELOGRAM/ URETEROSCOPY STONE EXTRACTION/  STENT PLACEMENT  06-07-2010  . LAPAROSCOPIC ASSISTED VAGINAL HYSTERECTOMY  04-25-2004   ovaries remain  . TUBAL LIGATION  1982    Current Outpatient Prescriptions  Medication Sig Dispense Refill  . buPROPion (WELLBUTRIN XL) 300 MG 24 hr tablet Take 1 tablet (300 mg total) by mouth daily. 30 tablet 3  . Cholecalciferol (VITAMIN D PO) Take 1 tablet by mouth daily. 1000units    . clotrimazole-betamethasone (LOTRISONE) cream Apply 1 application topically 2 (two) times daily. 45 g 1  . DULoxetine (CYMBALTA) 60 MG capsule Take 1 capsule (60 mg total) by mouth 2 (two) times daily. 60 capsule 6  . EPINEPHrine (EPIPEN) 0.3  mg/0.3 mL SOAJ injection Inject 0.3 mg into the muscle once.    . furosemide (LASIX) 20 MG tablet TAKE 1 TABLET(20 MG) BY MOUTH DAILY 30 tablet 3  . losartan-hydrochlorothiazide (HYZAAR) 100-12.5 MG tablet TAKE 1 TABLET BY MOUTH DAILY 30 tablet 6  . oxyCODONE-acetaminophen (PERCOCET/ROXICET) 5-325 MG tablet   0  . pregabalin (LYRICA) 100 MG capsule One capsule the morning, 2 in the evening 270 capsule 0  . QVAR REDIHALER 80 MCG/ACT inhaler INHALE 1 PUFF BY MOUTH TWICE DAILY 10.6 g 3  . ranitidine (ZANTAC) 300 MG tablet TAKE 1 TABLET(300 MG) BY MOUTH AT BEDTIME 30 tablet 6  . traZODone (DESYREL) 50 MG tablet Take 0.5-1 tablets (25-50 mg total) by mouth at bedtime as needed for sleep. 30 tablet 3  . VENTOLIN HFA 108 (90 Base) MCG/ACT inhaler INHALE 2 PUFFS BY MOUTH EVERY 4 HOURS AS NEEDED FOR SHORTNESS OF BREATH 36 g 3   No current facility-administered medications for this visit.     Allergies as of 09/03/2017 - Review Complete 09/03/2017  Allergen Reaction Noted  . Aspirin Swelling 10/11/2013  . Bee venom Swelling 07/04/2015    Vitals: BP 116/81   Pulse 72   Ht 5\' 5"  (1.651 m)   Wt 254 lb (115.2 kg)   BMI 42.27 kg/m  Last Weight:  Wt Readings from Last 1 Encounters:  09/03/17 254 lb (115.2 kg)   GBT:DVVO mass index is 42.27 kg/m.     Last Height:   Ht Readings from Last 1 Encounters:  09/03/17 5\' 5"  (1.651 m)    Physical exam:  General: The patient is awake, alert and appears not in acute distress. The patient is well groomed. Head: Normocephalic, atraumatic. Neck is supple. Mallampati 3 ,  neck circumference:14. Nasal airflow patent , TMJ is evident . Retrognathia is seen.  Cardiovascular:  Regular rate and rhythm, Respiratory: Lungs are clear to auscultation.  Neurologic exam : The patient is awake and alert, oriented to place and time.   Memory subjective  described as intact.   Attention span & concentration ability appears normal.  Speech is fluent,   Without  dysarthria, dysphonia or aphasia.  Mood and affect are appropriate.  Cranial nerves: Pupils are equal and briskly reactive to light. Facial motor strength is symmetric and tongue and uvula move midline. Shoulder shrug was symmetrical.   Motor exam:  Normal tone, muscle bulk and symmetric strength in all extremities.  The patient was advised of the nature of the diagnosed sleep disorder , the treatment options and risks for general a health and wellness arising from not treating the condition.  I spent more than 30  minutes of face to face time with the patient. Greater than 50% of time was spent in counseling and coordination of care. We have discussed the diagnosis and differential and I answered the patient's questions.     Assessment:  After physical and neurologic examination, review of laboratory studies,  Personal review of imaging studies, reports of other /same  Imaging studies ,  Results of polysomnography/ neurophysiology testing and pre-existing records as far as provided in visit., my assessment is   1) I suspect strongly that the patient's underlying pain disorder continues to interfere with her sleep. She has benefited form using CPAP, is less sleepy in daytime and a compliant user. I will see her yearly alternating with Ward Givens, NP     Larey Seat MD  09/03/2017   CC: . Floyde Parkins MD and Ward Givens , NP    Midge Minium, Md [address] [name] [address] [place], [place] [address]

## 2017-09-05 ENCOUNTER — Ambulatory Visit: Payer: Self-pay | Admitting: Family Medicine

## 2017-09-08 ENCOUNTER — Telehealth: Payer: Self-pay | Admitting: *Deleted

## 2017-09-08 ENCOUNTER — Encounter: Payer: Self-pay | Admitting: Family Medicine

## 2017-09-08 ENCOUNTER — Ambulatory Visit (INDEPENDENT_AMBULATORY_CARE_PROVIDER_SITE_OTHER): Payer: BLUE CROSS/BLUE SHIELD | Admitting: Family Medicine

## 2017-09-08 VITALS — BP 120/78 | HR 80 | Temp 98.3°F | Resp 16 | Ht 65.0 in | Wt 252.1 lb

## 2017-09-08 DIAGNOSIS — Z23 Encounter for immunization: Secondary | ICD-10-CM

## 2017-09-08 DIAGNOSIS — F4323 Adjustment disorder with mixed anxiety and depressed mood: Secondary | ICD-10-CM

## 2017-09-08 DIAGNOSIS — M797 Fibromyalgia: Secondary | ICD-10-CM

## 2017-09-08 MED ORDER — DICLOFENAC SODIUM 1 % TD GEL: 4.0000 g | Freq: Four times a day (QID) | TRANSDERMAL | 3 refills | Status: DC

## 2017-09-08 NOTE — Assessment & Plan Note (Signed)
Add Voltaren gel for symptomatic relief of joint pain and inflammation.  Pt expressed understanding and is in agreement w/ plan.

## 2017-09-08 NOTE — Telephone Encounter (Signed)
PA Started through cover my meds for Diclofenac.  Awaiting response from insurance.  KEY: [id number]

## 2017-09-08 NOTE — Patient Instructions (Signed)
Follow up as scheduled in November Continue the Wellbutrin daily- it's working! Add the Voltaren gel as needed for joint pain Ask Dr Brett Fairy or Dr Jannifer Franklin about the Requip Call with any questions or concerns Keep up the good work!!!

## 2017-09-08 NOTE — Progress Notes (Signed)
   Subjective:    Patient ID: Linda Buckley, female    DOB: 09-04-1956, 61 y.o.   MRN: [id number]  HPI Anxiety/depression- chronic problem.  Wellbutrin was increased to 300mg  at last visit due to ongoing sxs.  Pt feels this has been helpful.  Feeling less mopy and down.  Less tearful.  Joint pain- pt is asking for Voltaren gel for ongoing joint pains.  Review of Systems For ROS see HPI     Objective:   Physical Exam  Constitutional: She is oriented to person, place, and time. She appears well-developed and well-nourished. No distress.  obese  HENT:  Head: Normocephalic and atraumatic.  Neurological: She is alert and oriented to person, place, and time.  Skin: Skin is warm and dry.  Psychiatric: She has a normal mood and affect. Her behavior is normal. Thought content normal.  Vitals reviewed.         Assessment & Plan:

## 2017-09-08 NOTE — Progress Notes (Signed)
Pre visit review using our clinic review tool, if applicable. No additional management support is needed unless otherwise documented below in the visit note. 

## 2017-09-08 NOTE — Assessment & Plan Note (Signed)
Improved since increasing Wellbutrin to 300mg  daily.  Pt is doing much better.  No med changes at this time.  Will follow.

## 2017-09-09 ENCOUNTER — Other Ambulatory Visit: Payer: Self-pay | Admitting: General Practice

## 2017-09-09 MED ORDER — BECLOMETHASONE DIPROP HFA 80 MCG/ACT IN AERB: 1.0000 | INHALATION_SPRAY | Freq: Two times a day (BID) | RESPIRATORY_TRACT | 3 refills | Status: DC

## 2017-09-09 NOTE — Telephone Encounter (Signed)
Can we try again b/c pt is intolerant/allergic to ASA so Voltaren gel is the only anti-inflammatory she can use.

## 2017-09-09 NOTE — Telephone Encounter (Signed)
Insurance denied RX - did not list any approved meds.    Routed to provider to advise.

## 2017-09-09 NOTE — Telephone Encounter (Signed)
Resubmitted to insurance with the note below.   Medication approved 09/09/17-12/15/2038.   Pharmacy notified.

## 2017-09-10 ENCOUNTER — Telehealth: Payer: Self-pay | Admitting: Neurology

## 2017-09-10 ENCOUNTER — Encounter: Payer: Self-pay | Admitting: Adult Health

## 2017-09-10 ENCOUNTER — Telehealth: Payer: Self-pay | Admitting: Adult Health

## 2017-09-10 NOTE — Telephone Encounter (Signed)
  Mrs. Zeiser just called me back, confirming that she has never been seen here for restless legs, nor having been prescribed medication for this condition. She reports that her primary care physician did not want to prescribe medication and asked her to call our office. established fibromyalgia patient , has seen NP millikan and Dr Jannifer Franklin. She was given an October 1 st appointment at 8 AM.

## 2017-09-10 NOTE — Telephone Encounter (Signed)
Patient calling to get a Rx for Requip called to [facility] on [address] of [address]. She needs this for RLS>

## 2017-09-10 NOTE — Telephone Encounter (Signed)
I did not see Requip on her medication list. I called her and asked her to call me back.

## 2017-09-15 ENCOUNTER — Ambulatory Visit: Payer: Self-pay | Admitting: Adult Health

## 2017-09-15 ENCOUNTER — Ambulatory Visit: Payer: BLUE CROSS/BLUE SHIELD | Admitting: Adult Health

## 2017-09-20 ENCOUNTER — Other Ambulatory Visit: Payer: Self-pay | Admitting: Family Medicine

## 2017-09-24 ENCOUNTER — Other Ambulatory Visit: Payer: Self-pay | Admitting: Family Medicine

## 2017-10-23 ENCOUNTER — Other Ambulatory Visit: Payer: Self-pay

## 2017-10-23 ENCOUNTER — Other Ambulatory Visit: Payer: Self-pay | Admitting: Family Medicine

## 2017-10-23 ENCOUNTER — Emergency Department (HOSPITAL_COMMUNITY)
Admission: EM | Admit: 2017-10-23 | Discharge: 2017-10-23 | Disposition: A | Discharge status: Home / Self Care | Payer: BLUE CROSS/BLUE SHIELD | Attending: Emergency Medicine | Admitting: Emergency Medicine

## 2017-10-23 ENCOUNTER — Emergency Department (HOSPITAL_COMMUNITY): Payer: BLUE CROSS/BLUE SHIELD

## 2017-10-23 ENCOUNTER — Encounter (HOSPITAL_COMMUNITY): Payer: Self-pay | Admitting: Emergency Medicine

## 2017-10-23 ENCOUNTER — Telehealth: Payer: Self-pay | Admitting: Family Medicine

## 2017-10-23 DIAGNOSIS — Z79899 Other long term (current) drug therapy: Secondary | ICD-10-CM | POA: Diagnosis not present

## 2017-10-23 DIAGNOSIS — R42 Dizziness and giddiness: Secondary | ICD-10-CM | POA: Diagnosis present

## 2017-10-23 DIAGNOSIS — I10 Essential (primary) hypertension: Secondary | ICD-10-CM | POA: Diagnosis not present

## 2017-10-23 LAB — BASIC METABOLIC PANEL
Anion gap: 10 (ref 5–15)
BUN: 14 mg/dL (ref 6–20)
CO2: 29 mmol/L (ref 22–32)
Calcium: 9.8 mg/dL (ref 8.9–10.3)
Chloride: 102 mmol/L (ref 101–111)
Creatinine, Ser: 0.99 mg/dL (ref 0.44–1.00)
GFR calc Af Amer: >60 mL/min (ref >60)
GLUCOSE: 106 mg/dL — AB (ref 65–99)
POTASSIUM: 3.6 mmol/L (ref 3.5–5.1)
Sodium: 141 mmol/L (ref 135–145)

## 2017-10-23 LAB — URINALYSIS, ROUTINE W REFLEX MICROSCOPIC
BILIRUBIN URINE: NEGATIVE
Glucose, UA: NEGATIVE mg/dL
Hgb urine dipstick: NEGATIVE
Ketones, ur: NEGATIVE mg/dL
LEUKOCYTES UA: NEGATIVE
NITRITE: NEGATIVE
PH: 6 (ref 5.0–8.0)
Protein, ur: NEGATIVE mg/dL
SPECIFIC GRAVITY, URINE: 1.019 (ref 1.005–1.030)

## 2017-10-23 LAB — CBG MONITORING, ED: Glucose-Capillary: 107 mg/dL — ABNORMAL HIGH (ref 65–99)

## 2017-10-23 LAB — CBC
HCT: 39.6 % (ref 36.0–46.0)
Hemoglobin: 12.6 g/dL (ref 12.0–15.0)
MCH: 29.8 pg (ref 26.0–34.0)
MCHC: 31.8 g/dL (ref 30.0–36.0)
MCV: 93.6 fL (ref 78.0–100.0)
Platelets: 242 10*3/uL (ref 150–400)
RBC: 4.23 MIL/uL (ref 3.87–5.11)
RDW: 12.9 % (ref 11.5–15.5)
WBC: 4.4 10*3/uL (ref 4.0–10.5)

## 2017-10-23 MED ORDER — MECLIZINE HCL 25 MG PO TABS: 25.0000 mg | ORAL_TABLET | Freq: Three times a day (TID) | ORAL | 0 refills | Status: AC

## 2017-10-23 NOTE — ED Provider Notes (Signed)
[facility] DEPT Provider Note   CSN: [id number] Arrival date & time: 10/23/17  1744     History   Chief Complaint Chief Complaint  Patient presents with  . Dizziness    HPI Linda Buckley is a 61 y.o. female.  HPI Patient presents with concern of dizziness, unsteadiness. Patient acknowledges multiple medical issues including fibromyalgia, but states that she was generally well until 3 days ago. That morning she woke, and immediately felt unsteady, and lightheaded, when she sat on the edge of her bed. During the day she had multiple episodes of gait difficulty, lightheadedness, confusion, but no fall, no trauma. Symptoms persisted for 2 days, though the dizziness has abated somewhat. Currently, at rest, she states that she feels fine. However, just prior to ED arrival the patient called her primary care office, and was referred here for evaluation.  Past Medical History:  Diagnosis Date  . Anxiety   . Arthritis   . Carpal tunnel syndrome of right wrist    RECURRENT  . Depression   . Dysphagia 09/02/2017  . Edema 09/02/2017  . Environmental allergies    dust mites, pollen, roaches and other insects, mold  . Headache    migraines  . Hereditary and idiopathic peripheral neuropathy 04/03/2016  . History of adenomatous polyp of colon   . History of gastric ulcer   . History of kidney stones   . Hypertension   . Pneumonia   . Wears glasses     Patient Active Problem List   Diagnosis Date Noted  . Dysphagia 09/02/2017  . Rash 06/02/2017  . OSA (obstructive sleep apnea) 02/14/2017  . Multiple falls 09/04/2016  . Hereditary and idiopathic peripheral neuropathy 04/03/2016  . Chest pain 02/15/2016  . Exertional shortness of breath 01/26/2016  . Left leg pain 01/26/2016  . Trochanteric bursitis of both hips 12/19/2015  . Depression 08/28/2015  . Fibromyalgia 07/28/2015  . Right wrist pain 04/05/2015  . Adjustment disorder with mixed  anxiety and depressed mood 04/05/2015  . Routine general medical examination at a health care facility 03/01/2014  . HTN (hypertension) 01/12/2014  . Obesity (BMI 30-39.9) 01/12/2014  . Pre-diabetes 01/12/2014  . Family history of lupus erythematosus 01/12/2014  . Cough due to bronchospasm 01/12/2014  . Cough 11/19/2013  . Abdominal pain 10/17/2013  . Fatigue 10/17/2013  . CAP (community acquired pneumonia) 10/11/2013    Past Surgical History:  Procedure Laterality Date  . CARPAL TUNNEL RELEASE Right 2007  . CARPAL TUNNEL RELEASE Left 09/2016  . CERVICAL DISC ARTHROPLASTY  03/2015  . COLONOSCOPY W/ POLYPECTOMY  04-15-2014  . CYSTO/  RIGHT RETROGRADE PYELOGRAM/ URETEROSCOPY STONE EXTRACTION/  STENT PLACEMENT  06-07-2010  . LAPAROSCOPIC ASSISTED VAGINAL HYSTERECTOMY  04-25-2004   ovaries remain  . TUBAL LIGATION  1982    OB History    No data available       Home Medications    Prior to Admission medications   Medication Sig Start Date End Date Taking? Authorizing Provider  beclomethasone (QVAR REDIHALER) 80 MCG/ACT inhaler Inhale 1 puff into the lungs 2 (two) times daily. 09/09/17   Midge Minium, MD  buPROPion (WELLBUTRIN XL) 300 MG 24 hr tablet Take 1 tablet (300 mg total) by mouth daily. 08/08/17   Midge Minium, MD  Cholecalciferol (VITAMIN D PO) Take 1 tablet by mouth daily. 1000units    [provider]  clotrimazole-betamethasone (LOTRISONE) cream Apply 1 application topically 2 (two) times daily. 06/02/17   Birdie Riddle,  Aundra Millet, MD  diclofenac sodium (VOLTAREN) 1 % GEL Apply 4 g topically 4 (four) times daily. 09/08/17   Midge Minium, MD  DULoxetine (CYMBALTA) 60 MG capsule Take 1 capsule (60 mg total) by mouth 2 (two) times daily. 03/10/17   Kathrynn Ducking, MD  EPINEPHrine (EPIPEN) 0.3 mg/0.3 mL SOAJ injection Inject 0.3 mg into the muscle once.    [provider]  furosemide (LASIX) 20 MG tablet TAKE 1 TABLET(20 MG) BY MOUTH DAILY  08/13/17   Midge Minium, MD  losartan-hydrochlorothiazide [facility]) 100-12.5 MG tablet TAKE 1 TABLET BY MOUTH DAILY 05/27/17   Midge Minium, MD  Oxycodone HCl 10 MG TABS TK 1 T PO Q 8 H PRN P 09/05/17   [provider]  pregabalin (LYRICA) 100 MG capsule One capsule the morning, 2 in the evening 08/25/17   Ward Givens, NP  ranitidine (ZANTAC) 300 MG tablet TAKE 1 TABLET(300 MG) BY MOUTH AT BEDTIME 01/07/17   Midge Minium, MD  traZODone (DESYREL) 50 MG tablet Take 0.5-1 tablets (25-50 mg total) by mouth at bedtime as needed for sleep. 07/03/17   Midge Minium, MD  VENTOLIN HFA 108 [phone] Base) MCG/ACT inhaler INHALE 2 PUFFS BY MOUTH EVERY 4 HOURS AS NEEDED FOR SHORTNESS OF BREATH 10/23/17   Midge Minium, MD    Family History Family History  Problem Relation Age of Onset  . Asthma Father   . Diabetes Father   . Arthritis Mother   . Hypertension Mother   . Diabetes Mother   . Cancer Mother   . Stomach cancer Mother   . Diabetes Brother   . Diabetes Brother   . Asthma Son   . Cancer Maternal Grandfather        colon  . Colon cancer Maternal Grandfather   . Cancer Maternal Aunt        breast  . Cancer Maternal Uncle        lung  . Colon cancer Maternal Uncle   . Cancer Maternal Grandmother        ovarian  . Diabetes Son     Social History Social History   Tobacco Use  . Smoking status: Never Smoker  . Smokeless tobacco: Never Used  Substance Use Topics  . Alcohol use: Yes    Comment: 2 glasses of wine weekly  . Drug use: No     Allergies   Aspirin and Bee venom   Review of Systems Review of Systems  Constitutional:       Per HPI, otherwise negative  HENT:       Per HPI, otherwise negative  Respiratory:       Per HPI, otherwise negative  Cardiovascular:       Per HPI, otherwise negative  Gastrointestinal: Negative for vomiting.  Endocrine:       Negative aside from HPI  Genitourinary:       Neg aside from HPI     Musculoskeletal:       Per HPI, otherwise negative  Skin: Negative.   Neurological: Positive for dizziness and weakness. Negative for syncope and headaches.     Physical Exam Updated Vital Signs BP 132/83 (BP Location: Left Arm)   Pulse 74   Temp 97.9 F (36.6 C) (Oral)   Resp 16   Ht 5\' 6"  (1.676 m)   Wt 114.8 kg (253 lb)   SpO2 100%   BMI 40.84 kg/m   Physical Exam  Constitutional: She appears well-developed  and well-nourished. No distress.  HENT:  Head: Normocephalic and atraumatic.  Eyes: Conjunctivae are normal.  Neck: Neck supple.  Cardiovascular: Normal rate and regular rhythm.  No murmur heard. Pulmonary/Chest: Effort normal and breath sounds normal. No respiratory distress.  Abdominal: Soft. There is no tenderness.  Musculoskeletal: She exhibits no edema.  Neurological: She is alert. She displays atrophy. She displays no tremor. No cranial nerve deficit. She exhibits normal muscle tone. She displays no seizure activity. Coordination normal.  Skin: Skin is warm and dry.  Psychiatric: She has a normal mood and affect.  Nursing note and vitals reviewed.    ED Treatments / Results  Labs (all labs ordered are listed, but only abnormal results are displayed) Labs Reviewed  BASIC METABOLIC PANEL - Abnormal; Notable for the following components:      Result Value   Glucose, Bld 106 (*)    All other components within normal limits  CBG MONITORING, ED - Abnormal; Notable for the following components:   Glucose-Capillary 107 (*)    All other components within normal limits  CBC  URINALYSIS, ROUTINE W REFLEX MICROSCOPIC    EKG  EKG Interpretation  Date/Time:  Thursday October 23 2017 18:01:13 EST Ventricular Rate:  69 PR Interval:    QRS Duration: 100 QT Interval:  410 QTC Calculation: 440 R Axis:   -12 Text Interpretation:  Sinus rhythm T wave abnormality Artifact Abnormal ekg Confirmed by Carmin Muskrat [phone]) on 10/23/2017 8:24:07 PM        Radiology Mr Brain Wo Contrast (neuro Protocol)  Result Date: 10/23/2017 CLINICAL DATA:  Ataxia. Suspect stroke. History of hypertension, migraines. EXAM: MRI HEAD WITHOUT CONTRAST TECHNIQUE: Multiplanar, multiecho pulse sequences of the brain and surrounding structures were obtained without intravenous contrast. COMPARISON:  None. FINDINGS: BRAIN: No reduced diffusion to suggest acute ischemia. No susceptibility artifact to suggest hemorrhage. The ventricles and sulci are normal for patient's age. A few scattered nonspecific supratentorial white matter FLAIR T2 hyperintensities, normal for age. No suspicious parenchymal signal, mass or mass effect. No abnormal extra-axial fluid collections. VASCULAR: Normal major intracranial vascular flow voids present at skull base. SKULL AND UPPER CERVICAL SPINE: No abnormal sellar expansion. No suspicious calvarial bone marrow signal. Craniocervical junction maintained. SINUSES/ORBITS: The mastoid air-cells and included paranasal sinuses are well-aerated. The included ocular globes and orbital contents are non-suspicious. OTHER: None. IMPRESSION: Negative noncontrast MRI of the head for age. Electronically Signed   By: Elon Alas M.D.   On: 10/23/2017 22:04    Procedures Procedures (including critical care time)   Initial Impression / Assessment and Plan / ED Course  I have reviewed the triage vital signs and the nursing notes.  Pertinent labs & imaging results that were available during my care of the patient were reviewed by me and considered in my medical decision making (see chart for details).  Patient with multiple medical issues presents with worsening dizziness, confusion, visual disturbance. Here the patient is awake alert, and the physical exam is generally reassuring, but given the new description of dizziness and unsteadiness as well as vision changes, stroke is a consideration. Given the passage of time since onset, hemorrhage is less  likely, an MRI was performed rather than noncontrast head CT. MRI reassuring, labs reassuring. On repeat exam, patient felt better. With reassuring findings, no evidence for stroke patient started on medication for symptom control, discharged in stable condition to follow-up with primary care. Final Clinical Impressions(s) / ED Diagnoses   Final diagnoses:  Dizziness  Carmin Muskrat, MD 10/23/17 2235

## 2017-10-23 NOTE — ED Notes (Signed)
ED Provider at bedside. 

## 2017-10-23 NOTE — Telephone Encounter (Signed)
Agree w/ advice given- ER evaluation is warranted

## 2017-10-23 NOTE — Discharge Instructions (Signed)
As discussed, your evaluation today has been largely reassuring.  But, it is important that you monitor your condition carefully, and do not hesitate to return to the ED if you develop new, or concerning changes in your condition. ? ?Otherwise, please follow-up with your physician for appropriate ongoing care. ? ?

## 2017-10-23 NOTE — Telephone Encounter (Signed)
Spoke with patient regarding symptoms. Patient states since Tuesday (10/21/17), she has experienced dizziness, confusion, headaches and visual disturbances. She reports having to use the walls to hold herself up while walking. Denies any type of injury/fall. She states she is feeling somewhat better today, denying headaches and visual disturbance. Advised patient to go to the Emergency Room for evaluation and treatment. Patient verbalized understanding, will have her husband take her to [facility] ED.

## 2017-10-23 NOTE — ED Notes (Signed)
CBG 107 mg/dl

## 2017-10-23 NOTE — Telephone Encounter (Signed)
[facility]  Patient Name: Linda Buckley  DOB: [birth year]    Initial Comment Caller states she has been dizzy.    Nurse Assessment  Nurse: Rock Nephew, RN, Juliann Pulse Date/Time (Eastern Time): 10/23/2017 10:39:54 AM  Confirm and document reason for call. If symptomatic, describe symptoms. ---Caller states she had been dizzy since Monday . She feels better today but still feels " wobbly" when she walks. She reported having an irregular heartbeat on Monday but not today .  Does the patient have any new or worsening symptoms? ---Yes  Will a triage be completed? ---Yes  Related visit to physician within the last 2 weeks? ---No  Does the PT have any chronic conditions? (i.e. diabetes, asthma, etc.) ---Yes  List chronic conditions. ---Fibromyalgia  Is this a behavioral health or substance abuse call? ---No     Guidelines    Guideline Title Affirmed Question Affirmed Notes  Dizziness - Lightheadedness [1] MODERATE dizziness (e.g., interferes with normal activities) AND [2] has NOT been evaluated by physician for this (Exception: dizziness caused by heat exposure, sudden standing, or poor fluid intake)    Final Disposition User   See Physician within 24 Hours Wells, RN, Juliann Pulse    Comments  Appointment scheduled with Sandria Manly for 9am 10/24/2017 per Donnie Aho RN . Patient agreed with above appt time .   Referrals  REFERRED TO PCP OFFICE

## 2017-10-23 NOTE — Telephone Encounter (Signed)
Can you call and make sure pt does not need to be seen sooner?

## 2017-10-23 NOTE — ED Notes (Signed)
Patient transported to MRI 

## 2017-10-23 NOTE — ED Triage Notes (Addendum)
"  Spoke with patient regarding symptoms. Patient states since Tuesday (10/21/17), she has experienced dizziness, confusion, headaches and visual disturbances. She reports having to use the walls to hold herself up while walking. Denies any type of injury/fall. She states she is feeling somewhat better today, denying headaches and visual disturbance. Advised patient to go to the Emergency Room for evaluation and treatment. Patient verbalized understanding, will have her husband take her to [facility] ED."  Above noted from PCP. With triage pt verbalizes left earache, dizziness, confusion while driving, unsteady gait, and weakness. Pt denies numbness and/or unilateral weakness.

## 2017-10-24 ENCOUNTER — Ambulatory Visit: Payer: Self-pay | Admitting: Physician Assistant

## 2017-11-02 ENCOUNTER — Other Ambulatory Visit: Payer: Self-pay | Admitting: Family Medicine

## 2017-11-03 ENCOUNTER — Encounter: Payer: Self-pay | Admitting: Family Medicine

## 2017-11-03 ENCOUNTER — Ambulatory Visit: Payer: Self-pay | Admitting: Gastroenterology

## 2017-11-03 NOTE — Telephone Encounter (Signed)
Last OV 09/08/17 Trazodone last filled 07/03/17 #30 with 3  Please advise, pt no showed her CPE today.

## 2017-11-03 NOTE — Telephone Encounter (Signed)
[place] for #30 but needs to reschedule appt

## 2017-11-04 ENCOUNTER — Encounter: Payer: Self-pay | Admitting: General Practice

## 2017-11-04 NOTE — Telephone Encounter (Signed)
Pt on schedule for 11/05/17.

## 2017-11-05 ENCOUNTER — Other Ambulatory Visit: Payer: Self-pay

## 2017-11-05 ENCOUNTER — Encounter: Payer: Self-pay | Admitting: Family Medicine

## 2017-11-05 ENCOUNTER — Ambulatory Visit (INDEPENDENT_AMBULATORY_CARE_PROVIDER_SITE_OTHER): Payer: BLUE CROSS/BLUE SHIELD | Admitting: Family Medicine

## 2017-11-05 VITALS — BP 123/78 | HR 72 | Temp 97.9°F | Resp 17 | Ht 66.0 in | Wt 250.2 lb

## 2017-11-05 DIAGNOSIS — Z0000 Encounter for general adult medical examination without abnormal findings: Secondary | ICD-10-CM

## 2017-11-05 DIAGNOSIS — M25531 Pain in right wrist: Secondary | ICD-10-CM

## 2017-11-05 DIAGNOSIS — Z23 Encounter for immunization: Secondary | ICD-10-CM

## 2017-11-05 DIAGNOSIS — E559 Vitamin D deficiency, unspecified: Secondary | ICD-10-CM

## 2017-11-05 LAB — CBC WITH DIFFERENTIAL/PLATELET
BASOS PCT: 0.8 % (ref 0.0–3.0)
Basophils Absolute: 0 10*3/uL (ref 0.0–0.1)
EOS ABS: 0.1 10*3/uL (ref 0.0–0.7)
Eosinophils Relative: 3.5 % (ref 0.0–5.0)
HEMATOCRIT: 36.7 % (ref 36.0–46.0)
HEMOGLOBIN: 12.1 g/dL (ref 12.0–15.0)
LYMPHS PCT: 23.8 % (ref 12.0–46.0)
Lymphs Abs: 0.9 10*3/uL (ref 0.7–4.0)
MCHC: 32.8 g/dL (ref 30.0–36.0)
MCV: 92.1 fl (ref 78.0–100.0)
MONOS PCT: 7.5 % (ref 3.0–12.0)
Monocytes Absolute: 0.3 10*3/uL (ref 0.1–1.0)
Neutro Abs: 2.4 10*3/uL (ref 1.4–7.7)
Neutrophils Relative %: 64.4 % (ref 43.0–77.0)
Platelets: 220 10*3/uL (ref 150.0–400.0)
RBC: 3.98 Mil/uL (ref 3.87–5.11)
RDW: 13.3 % (ref 11.5–15.5)
WBC: 3.8 10*3/uL — AB (ref 4.0–10.5)

## 2017-11-05 LAB — BASIC METABOLIC PANEL
BUN: 19 mg/dL (ref 6–23)
CALCIUM: 9.7 mg/dL (ref 8.4–10.5)
CO2: 34 mEq/L — ABNORMAL HIGH (ref 19–32)
CREATININE: 1.02 mg/dL (ref 0.40–1.20)
Chloride: 105 mEq/L (ref 96–112)
GFR: 71.31 mL/min (ref >60.00)
Glucose, Bld: 100 mg/dL — ABNORMAL HIGH (ref 70–99)
POTASSIUM: 3.6 meq/L (ref 3.5–5.1)
Sodium: 144 mEq/L (ref 135–145)

## 2017-11-05 LAB — LIPID PANEL
CHOL/HDL RATIO: 3
Cholesterol: 169 mg/dL (ref 0–200)
HDL: 49.4 mg/dL (ref >39.00)
LDL CALC: 103 mg/dL — AB (ref 0–99)
NonHDL: 120.07
TRIGLYCERIDES: 85 mg/dL (ref 0.0–149.0)
VLDL: 17 mg/dL (ref 0.0–40.0)

## 2017-11-05 LAB — HEPATIC FUNCTION PANEL
ALT: 9 U/L (ref 0–35)
AST: 12 U/L (ref 0–37)
Albumin: 3.8 g/dL (ref 3.5–5.2)
Alkaline Phosphatase: 69 U/L (ref 39–117)
BILIRUBIN DIRECT: 0.1 mg/dL (ref 0.0–0.3)
BILIRUBIN TOTAL: 0.4 mg/dL (ref 0.2–1.2)
Total Protein: 6.2 g/dL (ref 6.0–8.3)

## 2017-11-05 LAB — HEMOGLOBIN A1C: Hgb A1c MFr Bld: 6 % (ref 4.6–6.5)

## 2017-11-05 LAB — VITAMIN D 25 HYDROXY (VIT D DEFICIENCY, FRACTURES): VITD: 34.53 ng/mL (ref 30.00–100.00)

## 2017-11-05 LAB — TSH: TSH: 1.89 u[IU]/mL (ref 0.35–4.50)

## 2017-11-05 MED ORDER — CETIRIZINE HCL 10 MG PO TABS: 10.0000 mg | ORAL_TABLET | Freq: Every day | ORAL | 11 refills | Status: DC

## 2017-11-05 NOTE — Assessment & Plan Note (Signed)
Ongoing wrist and hand pain.  Asking for referral to new specialist as the one she has been seeing tod her 'there was nothing he could do'.  Referral placed.

## 2017-11-05 NOTE — Progress Notes (Signed)
   Subjective:    Patient ID: Linda Buckley, female    DOB: [birth year], 61 y.o.   MRN: [id number]  HPI CPE- UTD on mammo, colonoscopy.  No need for pap due to hysterectomy.  UTD on flu, due for Tdap.     Review of Systems Patient reports no vision/ hearing changes, adenopathy,fever, weight change,  persistant/recurrent hoarseness, chest pain, palpitations, edema, persistant/recurrent cough, hemoptysis, dyspnea (rest/exertional/paroxysmal nocturnal), gastrointestinal bleeding (melena, rectal bleeding), abdominal pain, significant heartburn, bowel changes, GU symptoms (dysuria, hematuria, incontinence), Gyn symptoms (abnormal  bleeding, pain),  syncope, focal weakness, memory loss, skin/hair/nail changes, abnormal bruising or bleeding, anxiety, or depression.   + dysphagia- seeing Dr Ardis Hughs + chronic numbness of hands    Objective:   Physical Exam General Appearance:    Alert, cooperative, no distress, appears stated age, obese  Head:    Normocephalic, without obvious abnormality, atraumatic  Eyes:    PERRL, conjunctiva/corneas clear, EOM's intact, fundi    benign, both eyes  Ears:    Normal TM's and external ear canals, both ears  Nose:   Nares normal, septum midline, mucosa normal, turbinate edema  Throat:   Lips, mucosa, and tongue normal; teeth and gums normal, PND  Neck:   Supple, symmetrical, trachea midline, no adenopathy;    Thyroid: no enlargement/tenderness/nodules  Back:     Symmetric, no curvature, ROM normal, no CVA tenderness  Lungs:     Clear to auscultation bilaterally, respirations unlabored  Chest Wall:    No tenderness or deformity   Heart:    Regular rate and rhythm, S1 and S2 normal, no murmur, rub   or gallop  Breast Exam:    Deferred to mammo  Abdomen:     Soft, non-tender, bowel sounds active all four quadrants,    no masses, no organomegaly  Genitalia:    Deferred to GYN  Rectal:    Extremities:   Extremities normal, atraumatic, no cyanosis or edema    Pulses:   2+ and symmetric all extremities  Skin:   Skin color, texture, turgor normal, no rashes or lesions  Lymph nodes:   Cervical, supraclavicular, and axillary nodes normal  Neurologic:   CNII-XII intact, normal strength, sensation and reflexes    throughout          Assessment & Plan:

## 2017-11-05 NOTE — Addendum Note (Signed)
Addended by: Davis Gourd on: 11/05/2017 08:59 AM   Modules accepted: Orders

## 2017-11-05 NOTE — Assessment & Plan Note (Signed)
Pt's PE WNL w/ exception of obesity.  UTD on mammo, colonoscopy.  Tdap given.  UTD on flu.  Check labs.  Anticipatory guidance provided.

## 2017-11-05 NOTE — Patient Instructions (Signed)
Follow up in 6 months to recheck BP and weight loss progress We'll notify you of your lab results and make any changes if needed Continue to work on healthy diet and regular exercise- you can do it! We'll call you with your Hand Surgery appt You are now up to date on tetanus- yay! Call with any questions or concerns Happy Thanksgiving!!!

## 2017-11-05 NOTE — Assessment & Plan Note (Signed)
Check labs.  Replete prn. 

## 2017-11-05 NOTE — Assessment & Plan Note (Signed)
Ongoing issue.  Pt has lost 3 lbs since last visit.  Applauded her efforts.  Check labs to risk stratify.  Stressed need for healthy diet and regular exercise.  Will follow.

## 2017-11-09 ENCOUNTER — Other Ambulatory Visit: Payer: Self-pay | Admitting: Family Medicine

## 2017-11-10 ENCOUNTER — Encounter: Payer: Self-pay | Admitting: General Practice

## 2017-11-10 ENCOUNTER — Encounter (INDEPENDENT_AMBULATORY_CARE_PROVIDER_SITE_OTHER): Payer: Self-pay

## 2017-11-10 ENCOUNTER — Ambulatory Visit: Payer: BLUE CROSS/BLUE SHIELD | Admitting: Gastroenterology

## 2017-11-10 ENCOUNTER — Encounter: Payer: Self-pay | Admitting: Gastroenterology

## 2017-11-10 VITALS — BP 122/76 | HR 68 | Ht 66.0 in | Wt 254.5 lb

## 2017-11-10 DIAGNOSIS — R1013 Epigastric pain: Secondary | ICD-10-CM | POA: Diagnosis not present

## 2017-11-10 DIAGNOSIS — R1310 Dysphagia, unspecified: Secondary | ICD-10-CM

## 2017-11-10 DIAGNOSIS — R1011 Right upper quadrant pain: Secondary | ICD-10-CM

## 2017-11-10 MED ORDER — OMEPRAZOLE 40 MG PO CPDR: 40.0000 mg | DELAYED_RELEASE_CAPSULE | Freq: Every day | ORAL | 3 refills | Status: DC

## 2017-11-10 NOTE — Progress Notes (Signed)
Review of pertinent gastrointestinal problems: 1. PRecancerous colon polyp: 04/2014 screening colonoscopy found a single subCM adenoma, otherwise normal. Recommended repeat colonoscopy at 5 years for surveillance.  HPI: This is a very pleasant 61 year old woman who was referred to me by Midge Minium, MD  to evaluate dysphasia.    Chief complaint is dysphasia, also right upper quadrant, epigastric pain   Food hangs in mid esophagus, has been going on for about a year.  Occurs with every meal.  Just solids.   Drinking water will usually help. Has regurg at times.   Overall weight is stable.  Also a sharp RUQ pain. Has been going on for years.  Occurs 2-3 times per week.  Will last a few minutes.  No relation to eating or BMs.  + nausea at same time.  The pain can sometimes be in her epigastrium.  Was put on h2 blocker a year ago, no help with the RUQ pain. Stopped it a week ago.  She did not notice if this helped her dysphagia    Review of systems: Pertinent positive and negative review of systems were noted in the above HPI section. All other review negative.   Past Medical History:  Diagnosis Date  . Anxiety   . Arthritis   . Carpal tunnel syndrome of right wrist    RECURRENT  . Depression   . Dysphagia 09/02/2017  . Edema 09/02/2017  . Environmental allergies    dust mites, pollen, roaches and other insects, mold  . Headache    migraines  . Hereditary and idiopathic peripheral neuropathy 04/03/2016  . History of adenomatous polyp of colon   . History of gastric ulcer   . History of kidney stones   . Hypertension   . Pneumonia   . Wears glasses     Past Surgical History:  Procedure Laterality Date  . CARPAL TUNNEL RELEASE Right 2007  . CARPAL TUNNEL RELEASE Right 07/05/2015   Procedure: RIGHT HAND REVISION CARPAL TUNNEL RELEASE;  Surgeon: Iran Planas, MD;  Location: [facility];  Service: Orthopedics;  Laterality: Right;  . CARPAL TUNNEL RELEASE Left 09/2016  .  CERVICAL DISC ARTHROPLASTY  03/2015  . COLONOSCOPY W/ POLYPECTOMY  04-15-2014  . CYSTO/  RIGHT RETROGRADE PYELOGRAM/ URETEROSCOPY STONE EXTRACTION/  STENT PLACEMENT  06-07-2010  . LAPAROSCOPIC ASSISTED VAGINAL HYSTERECTOMY  04-25-2004   ovaries remain  . TUBAL LIGATION  1982    Current Outpatient Medications  Medication Sig Dispense Refill  . beclomethasone (QVAR REDIHALER) 80 MCG/ACT inhaler Inhale 1 puff into the lungs 2 (two) times daily. 10.6 g 3  . buPROPion (WELLBUTRIN XL) 300 MG 24 hr tablet Take 1 tablet (300 mg total) by mouth daily. 30 tablet 3  . cetirizine (ZYRTEC) 10 MG tablet Take 1 tablet (10 mg total) by mouth daily. 30 tablet 11  . Cholecalciferol (VITAMIN D PO) Take 1 tablet by mouth daily. 1000units    . clotrimazole-betamethasone (LOTRISONE) cream Apply 1 application topically 2 (two) times daily. 45 g 1  . diclofenac sodium (VOLTAREN) 1 % GEL Apply 4 g topically 4 (four) times daily. 100 g 3  . DULoxetine (CYMBALTA) 60 MG capsule Take 1 capsule (60 mg total) by mouth 2 (two) times daily. 60 capsule 6  . EPINEPHrine (EPIPEN) 0.3 mg/0.3 mL SOAJ injection Inject 0.3 mg into the muscle once.    . furosemide (LASIX) 20 MG tablet TAKE 1 TABLET(20 MG) BY MOUTH DAILY 30 tablet 3  . losartan-hydrochlorothiazide (HYZAAR) 50-12.5 MG tablet TAKE  1 TABLET BY MOUTH EVERY DAY 90 tablet 0  . Oxycodone HCl 10 MG TABS TK 1 T PO Q 8 H PRN P  0  . pregabalin (LYRICA) 100 MG capsule One capsule the morning, 2 in the evening 270 capsule 0  . ranitidine (ZANTAC) 300 MG tablet TAKE 1 TABLET(300 MG) BY MOUTH AT BEDTIME 30 tablet 6  . traZODone (DESYREL) 50 MG tablet TAKE 1/2 TO 1 TABLET(25 TO 50 MG) BY MOUTH AT BEDTIME AS NEEDED FOR SLEEP 30 tablet 0  . VENTOLIN HFA 108 (90 Base) MCG/ACT inhaler INHALE 2 PUFFS BY MOUTH EVERY 4 HOURS AS NEEDED FOR SHORTNESS OF BREATH 36 g 0   No current facility-administered medications for this visit.     Allergies as of 11/10/2017 - Review Complete  11/10/2017  Allergen Reaction Noted  . Aspirin Swelling 10/11/2013  . Bee venom Swelling 07/04/2015    Family History  Problem Relation Age of Onset  . Asthma Father   . Diabetes Father   . Arthritis Mother   . Hypertension Mother   . Diabetes Mother   . Cancer Mother   . Stomach cancer Mother   . Diabetes Brother   . Diabetes Brother   . Asthma Son   . Cancer Maternal Grandfather        colon  . Colon cancer Maternal Grandfather   . Cancer Maternal Aunt        breast  . Cancer Maternal Uncle        lung  . Colon cancer Maternal Uncle   . Cancer Maternal Grandmother        ovarian  . Diabetes Son     Social History   Socioeconomic History  . Marital status: Married    Spouse name: Not on file  . Number of children: 2  . Years of education: 2 yrs college  . Highest education level: Not on file  Social Needs  . Financial resource strain: Not on file  . Food insecurity - worry: Not on file  . Food insecurity - inability: Not on file  . Transportation needs - medical: Not on file  . Transportation needs - non-medical: Not on file  Occupational History  . 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 Smoking status: Never Smoker  . Smokeless tobacco: Never Used  Substance and Sexual Activity  . Alcohol use: Yes    Comment: 2 glasses of wine weekly  . Drug use: No  . Sexual activity: Not on file  Other Topics Concern  . Not on file  Social History Narrative   Lives at home w/ her husband   Right-handed   Drinks 2 cups of coffee weekly    3 bottle water per day     Physical Exam: BP 122/76   Pulse 68   Ht 5\' 6"  (1.676 m)   Wt 254 lb 8 oz (115.4 kg)   BMI 41.08 kg/m  Constitutional: generally well-appearing Psychiatric: alert and oriented x3 Eyes: extraocular movements intact Mouth: oral pharynx moist, no lesions Neck: supple no lymphadenopathy Cardiovascular: heart regular rate and rhythm Lungs: clear to auscultation  bilaterally Abdomen: soft, nontender, nondistended, no obvious ascites, no peritoneal signs, normal bowel sounds Extremities: no lower extremity edema bilaterally Skin: no lesions on visible extremities   Assessment and plan: 61 y.o. female with mildly progressive solid food only dysphagia without weight loss, intermittent biliary-like pains  First her right upper quadrant epigastric pains seem very  possibly to represent biliary colic.  Recommending an ultrasound and if gallstones are noted I will likely refer her for general surgery consultation.  She also has nearly daily dysphasia to solid foods only.  I think this might be peptic related.  I am recommending that she try proton pump inhibitor once every morning 20-30 minutes before breakfast and we will arrange upper endoscopy to check for stricture requiring dilation, neoplasm which I think is unlikely.   Please see the "Patient Instructions" section for addition details about the plan.   Owens Loffler, MD [facility] 11/10/2017, 10:53 AM  Cc: Midge Minium, MD

## 2017-11-10 NOTE — Patient Instructions (Addendum)
[name] for RUQ, epigastric pains.  You have been scheduled for an abdominal ultrasound at [facility] (1st floor of hospital) on 11/13/17 at 1130 am. Please arrive 15 minutes prior to your appointment for registration. Make certain not to have anything to eat or drink 6 hours prior to your appointment. Should you need to reschedule your appointment, please contact radiology at [phone]. This test typically takes about 30 minutes to perform.  You will be set up for an upper endoscopy for dysphagia.  Omeprazole 40mg  pill, one pill 20-30 min before breakfast meal daily, disp 30 with 3 refills.  Normal BMI (Body Mass Index- based on height and weight) is between 19 and 25. Your BMI today is Body mass index is 41.08 kg/m. Marland Kitchen Please consider follow up  regarding your BMI with your Primary Care Provider.

## 2017-11-12 ENCOUNTER — Other Ambulatory Visit: Payer: Self-pay

## 2017-11-12 ENCOUNTER — Ambulatory Visit: Payer: BLUE CROSS/BLUE SHIELD | Admitting: Family Medicine

## 2017-11-12 ENCOUNTER — Encounter: Payer: Self-pay | Admitting: Family Medicine

## 2017-11-12 VITALS — BP 118/82 | HR 81 | Temp 98.6°F | Resp 17 | Ht 66.0 in | Wt 252.4 lb

## 2017-11-12 DIAGNOSIS — J029 Acute pharyngitis, unspecified: Secondary | ICD-10-CM

## 2017-11-12 LAB — POCT RAPID STREP A (OFFICE): RAPID STREP A SCREEN: NEGATIVE

## 2017-11-12 MED ORDER — PROMETHAZINE-DM 6.25-15 MG/5ML PO SYRP: 5.0000 mL | ORAL_SOLUTION | Freq: Four times a day (QID) | ORAL | 0 refills | Status: DC | PRN

## 2017-11-12 NOTE — Patient Instructions (Addendum)
Thankfully this appears to be a viral illness and should improve w/ time Strep test was negative!  No need for antibiotics Drink plenty of fluids REST! Add Tylenol/ibuprofen as needed for body aches Use the Promethazine cough syrup as needed- may cause drowsiness Call with any questions or concerns Hang in there!!!

## 2017-11-12 NOTE — Progress Notes (Signed)
   Subjective:    Patient ID: Linda Buckley, female    DOB: 25-Dec-1955, 61 y.o.   MRN: [id number]  HPI Sore throat/body aches- + sweats, chills, no documented fever.  Diarrhea started this AM.  Sore throat and body aches started 2 days ago.  No known sick contacts.  + HA.  No sinus pain/pressure.  Painful to swallow.  Intermittent cough.   Review of Systems For ROS see HPI     Objective:   Physical Exam  Constitutional: She is oriented to person, place, and time. She appears well-developed and well-nourished. No distress.  HENT:  Head: Normocephalic and atraumatic.  TMs normal bilaterally Mild nasal congestion Throat w/out erythema, edema, or exudate  Eyes: Conjunctivae and EOM are normal. Pupils are equal, round, and reactive to light.  Neck: Normal range of motion. Neck supple.  Cardiovascular: Normal rate, regular rhythm, normal heart sounds and intact distal pulses.  No murmur heard. Pulmonary/Chest: Effort normal and breath sounds normal. No respiratory distress. She has no wheezes.  + hacking cough  Lymphadenopathy:    She has no cervical adenopathy.  Neurological: She is alert and oriented to person, place, and time.  Skin: Skin is warm and dry.  Psychiatric: She has a normal mood and affect. Her behavior is normal. Thought content normal.  Vitals reviewed.         Assessment & Plan:  Sore throat- new. Pt's sxs are not consistent w/ strep- + cough, lack of fever.  Most likely a viral illness.  Start cough meds prn.  Reviewed supportive care and red flags that should prompt return.  Pt expressed understanding and is in agreement w/ plan.

## 2017-11-13 ENCOUNTER — Ambulatory Visit (HOSPITAL_COMMUNITY): Admission: RE | Admit: 2017-11-13 | Payer: BLUE CROSS/BLUE SHIELD | Source: Ambulatory Visit

## 2017-11-20 ENCOUNTER — Ambulatory Visit (HOSPITAL_COMMUNITY)
Admission: RE | Admit: 2017-11-20 | Discharge: 2017-11-20 | Disposition: A | Discharge status: Home / Self Care | Payer: BLUE CROSS/BLUE SHIELD | Source: Ambulatory Visit | Attending: Gastroenterology | Admitting: Gastroenterology

## 2017-11-20 DIAGNOSIS — R1011 Right upper quadrant pain: Secondary | ICD-10-CM | POA: Insufficient documentation

## 2017-11-20 DIAGNOSIS — R1013 Epigastric pain: Secondary | ICD-10-CM | POA: Insufficient documentation

## 2017-11-20 DIAGNOSIS — R1310 Dysphagia, unspecified: Secondary | ICD-10-CM | POA: Insufficient documentation

## 2017-11-22 ENCOUNTER — Other Ambulatory Visit: Payer: Self-pay | Admitting: Neurology

## 2017-11-22 ENCOUNTER — Other Ambulatory Visit: Payer: Self-pay | Admitting: Family Medicine

## 2017-11-24 ENCOUNTER — Telehealth: Payer: Self-pay | Admitting: *Deleted

## 2017-11-24 ENCOUNTER — Other Ambulatory Visit: Payer: Self-pay | Admitting: Adult Health

## 2017-11-24 ENCOUNTER — Encounter: Payer: Self-pay | Admitting: Gastroenterology

## 2017-11-24 NOTE — Telephone Encounter (Signed)
Called and LVM for pt to call back Wednesday to r/s appt that is cx for tomorrow d/t inclement weather. Gave [facility] phone number.

## 2017-11-25 ENCOUNTER — Ambulatory Visit: Payer: BLUE CROSS/BLUE SHIELD | Admitting: Neurology

## 2017-11-26 ENCOUNTER — Telehealth: Payer: Self-pay | Admitting: Neurology

## 2017-11-26 ENCOUNTER — Other Ambulatory Visit: Payer: Self-pay | Admitting: Neurology

## 2017-11-26 MED ORDER — PREGABALIN 100 MG PO CAPS: ORAL_CAPSULE | ORAL | 0 refills | Status: DC

## 2017-11-26 NOTE — Telephone Encounter (Signed)
Pt calling for a refill of pregabalin (LYRICA) 100 MG capsule [facility] [place], [place] [phone] (Phone)

## 2017-11-29 ENCOUNTER — Other Ambulatory Visit: Payer: Self-pay | Admitting: Family Medicine

## 2017-11-30 ENCOUNTER — Other Ambulatory Visit: Payer: Self-pay | Admitting: Family Medicine

## 2017-12-01 ENCOUNTER — Encounter: Payer: Self-pay | Admitting: Neurology

## 2017-12-01 ENCOUNTER — Ambulatory Visit: Payer: BLUE CROSS/BLUE SHIELD | Admitting: Neurology

## 2017-12-01 VITALS — BP 112/80 | HR 84 | Ht 66.0 in | Wt 251.0 lb

## 2017-12-01 DIAGNOSIS — M797 Fibromyalgia: Secondary | ICD-10-CM | POA: Diagnosis not present

## 2017-12-01 MED ORDER — PREGABALIN 200 MG PO CAPS: 200.0000 mg | ORAL_CAPSULE | Freq: Two times a day (BID) | ORAL | 1 refills | Status: DC

## 2017-12-01 NOTE — Patient Instructions (Signed)
   We will go up on the Lyrica taking 200 mg twice a day.  Lyrica (pregabalin) may cause drowsiness, gait instability, ankle swelling, or cognitive slowing as well as possible dizziness. If any significant side effects occur associated with this medication, please contact our office.

## 2017-12-01 NOTE — Progress Notes (Signed)
Reason for visit: Fibromyalgia  Linda Buckley is an 61 y.o. female  History of present illness:  Linda Buckley is a 61 year old right-handed black female with a history of fibromyalgia.  The patient is followed through the [facility], she gets Lyrica and Cymbalta through this office.  The patient is still having quite a bit of pain, she went to the emergency room around 23 October 2017 with episodes of dizziness.  The episodes have improved but still do occur on occasion.  She was given some meclizine to take.  MRI of the brain that was done on that date was unremarkable.  The patient has gone through some physical therapy, she has some exercises that she will perform on her own.  She walks with a cane, she denies any falls.  She returns to the office today for an evaluation.  Past Medical History:  Diagnosis Date  . Anxiety   . Arthritis   . Carpal tunnel syndrome of right wrist    RECURRENT  . Depression   . Dysphagia 09/02/2017  . Edema 09/02/2017  . Environmental allergies    dust mites, pollen, roaches and other insects, mold  . Headache    migraines  . Hereditary and idiopathic peripheral neuropathy 04/03/2016  . History of adenomatous polyp of colon   . History of gastric ulcer   . History of kidney stones   . Hypertension   . Pneumonia   . Wears glasses     Past Surgical History:  Procedure Laterality Date  . CARPAL TUNNEL RELEASE Right 2007  . CARPAL TUNNEL RELEASE Right 07/05/2015   Procedure: RIGHT HAND REVISION CARPAL TUNNEL RELEASE;  Surgeon: Iran Planas, MD;  Location: [facility];  Service: Orthopedics;  Laterality: Right;  . CARPAL TUNNEL RELEASE Left 09/2016  . CERVICAL DISC ARTHROPLASTY  03/2015  . COLONOSCOPY W/ POLYPECTOMY  04-15-2014  . CYSTO/  RIGHT RETROGRADE PYELOGRAM/ URETEROSCOPY STONE EXTRACTION/  STENT PLACEMENT  06-07-2010  . LAPAROSCOPIC ASSISTED VAGINAL HYSTERECTOMY  04-25-2004   ovaries remain  . TUBAL LIGATION  1982    Family  History  Problem Relation Age of Onset  . Asthma Father   . Diabetes Father   . Arthritis Mother   . Hypertension Mother   . Diabetes Mother   . Cancer Mother   . Stomach cancer Mother   . Diabetes Brother   . Diabetes Brother   . Asthma Son   . Cancer Maternal Grandfather        colon  . Colon cancer Maternal Grandfather   . Cancer Maternal Aunt        breast  . Cancer Maternal Uncle        lung  . Colon cancer Maternal Uncle   . Cancer Maternal Grandmother        ovarian  . Diabetes Son     Social history:  reports that  has never smoked. she has never used smokeless tobacco. She reports that she drinks alcohol. She reports that she does not use drugs.    Allergies  Allergen Reactions  . Aspirin Swelling    Sweating/ swelling of hands and face   . Bee Venom Swelling    Swelling at site     Medications:  Prior to Admission medications   Medication Sig Start Date End Date Taking? Authorizing Provider  beclomethasone (QVAR REDIHALER) 80 MCG/ACT inhaler Inhale 1 puff into the lungs 2 (two) times daily. 09/09/17   Midge Minium, MD  buPROPion (WELLBUTRIN XL) 300 MG 24 hr tablet TAKE 1 TABLET(300 MG) BY MOUTH DAILY 12/01/17   Midge Minium, MD  cetirizine (ZYRTEC) 10 MG tablet Take 1 tablet (10 mg total) by mouth daily. 11/05/17   Midge Minium, MD  Cholecalciferol (VITAMIN D PO) Take 1 tablet by mouth daily. 1000units    [provider]  clotrimazole-betamethasone (LOTRISONE) cream Apply 1 application topically 2 (two) times daily. 06/02/17   Midge Minium, MD  diclofenac sodium (VOLTAREN) 1 % GEL Apply 4 g topically 4 (four) times daily. 09/08/17   Midge Minium, MD  DULoxetine (CYMBALTA) 60 MG capsule TAKE 1 CAPSULE(60 MG) BY MOUTH TWICE DAILY 11/23/17   Ward Givens, NP  EPINEPHrine (EPIPEN) 0.3 mg/0.3 mL SOAJ injection Inject 0.3 mg into the muscle once.    [provider]  furosemide (LASIX) 20 MG tablet TAKE 1 TABLET(20  MG) BY MOUTH DAILY 08/13/17   Midge Minium, MD  losartan-hydrochlorothiazide [facility]) 50-12.5 MG tablet TAKE 1 TABLET BY MOUTH EVERY DAY 11/10/17   Midge Minium, MD  omeprazole (PRILOSEC) 40 MG capsule Take 1 capsule (40 mg total) by mouth daily. 11/10/17 12/10/17  Milus Banister, MD  Oxycodone HCl 10 MG TABS TK 1 T PO Q 8 H PRN P 09/05/17   [provider]  pregabalin (LYRICA) 100 MG capsule One capsule the morning, 2 in the evening 11/26/17   Melvenia Beam, MD  promethazine-dextromethorphan (PROMETHAZINE-DM) 6.25-15 MG/5ML syrup Take 5 mLs by mouth 4 (four) times daily as needed. 11/12/17   Midge Minium, MD  ranitidine (ZANTAC) 300 MG tablet TAKE 1 TABLET(300 MG) BY MOUTH AT BEDTIME 01/07/17   Midge Minium, MD  traZODone (DESYREL) 50 MG tablet TAKE 1/2 TO 1 TABLET(25 TO 50 MG) BY MOUTH AT BEDTIME AS NEEDED FOR SLEEP 12/01/17   Midge Minium, MD  VENTOLIN HFA 108 (90 Base) MCG/ACT inhaler INHALE 2 PUFFS BY MOUTH EVERY 4 HOURS AS NEEDED FOR SHORTNESS OF BREATH 11/26/17   Midge Minium, MD    ROS:  Out of a complete 14 system review of symptoms, the patient complains only of the following symptoms, and all other reviewed systems are negative.   Shortness of breath,decreased activity, fatigue, excessive sweating Blurred vision Leg swelling Abdominal pain Daytime sleepiness Back pain, aching muscles, walking difficulty, neck pain Bruising easily Memory loss, dizziness, headache, numbness, speech difficulty, weakness, tremors Confusion  Blood pressure 120/80, height 5\' 6"  (1.676 m), weight 251 lb (113.9 kg).  Physical Exam  General: The patient is alert and cooperative at the time of the examination.  The patient is moderately obese.  Neuromuscular: The patient is able to flex to about 100 degrees with the low back, full range of movement with the neck is noted.  Skin: No significant peripheral edema is noted.   Neurologic  Exam  Mental status: The patient is alert and oriented x 3 at the time of the examination. The patient has apparent normal recent and remote memory, with an apparently normal attention span and concentration ability.   Cranial nerves: Facial symmetry is present. Speech is normal, no aphasia or dysarthria is noted. Extraocular movements are full. Visual fields are full.  Motor: The patient has good strength in all 4 extremities.  Sensory examination: Soft touch sensation is symmetric on the face, arms, and legs.  Coordination: The patient has good finger-nose-finger and heel-to-shin bilaterally.  Gait and station: The patient has a normal gait, the patient  usually uses a cane for ambulation. Tandem gait is slightly unsteady. Romberg is negative. No drift is seen.  Reflexes: Deep tendon reflexes are symmetric.   MRI brain 10/23/17:  IMPRESSION: Negative noncontrast MRI of the head for age.  * MRI scan images were reviewed online. I agree with the written report.   Assessment/Plan:  1.  Fibromyalgia, chronic pain syndrome  The patient will go up on the Lyrica taking 200 mg twice daily, she will remain on the Cymbalta.  She will follow-up through this office in about 6 months.  The patient is encouraged to continue neuromuscular exercises.  Jill Alexanders MD 12/01/2017 4:06 PM  [facility] [address] [name], [place] [address]  Phone [phone] Fax [phone]

## 2017-12-01 NOTE — Progress Notes (Signed)
Faxed printed/signed rx pregabalin to [employer] at [phone]. Received fax confirmation.

## 2017-12-08 ENCOUNTER — Ambulatory Visit (AMBULATORY_SURGERY_CENTER): Payer: BLUE CROSS/BLUE SHIELD | Admitting: Gastroenterology

## 2017-12-08 ENCOUNTER — Encounter: Payer: Self-pay | Admitting: Gastroenterology

## 2017-12-08 ENCOUNTER — Other Ambulatory Visit: Payer: Self-pay

## 2017-12-08 VITALS — BP 112/75 | HR 67 | Temp 95.9°F | Resp 16 | Ht 66.0 in | Wt 254.0 lb

## 2017-12-08 DIAGNOSIS — K222 Esophageal obstruction: Secondary | ICD-10-CM

## 2017-12-08 DIAGNOSIS — R1310 Dysphagia, unspecified: Secondary | ICD-10-CM

## 2017-12-08 HISTORY — PX: UPPER GASTROINTESTINAL ENDOSCOPY: SHX188

## 2017-12-08 MED ORDER — SODIUM CHLORIDE 0.9 % IV SOLN: 500.0000 mL | Freq: Once | INTRAVENOUS | Status: DC

## 2017-12-08 NOTE — Patient Instructions (Signed)
YOU HAD AN ENDOSCOPIC PROCEDURE TODAY AT [facility]:   Refer to the procedure report that was given to you for any specific questions about what was found during the examination.  If the procedure report does not answer your questions, please call your gastroenterologist to clarify.  If you requested that your care partner not be given the details of your procedure findings, then the procedure report has been included in a sealed envelope for you to review at your convenience later.  YOU SHOULD EXPECT: Some feelings of bloating in the abdomen. Passage of more gas than usual.  Walking can help get rid of the air that was put into your GI tract during the procedure and reduce the bloating.   Please Note:  You might notice some irritation and congestion in your nose or some drainage.  This is from the oxygen used during your procedure.  There is no need for concern and it should clear up in a day or so.  SYMPTOMS TO REPORT IMMEDIATELY:  Following upper endoscopy (EGD)  Vomiting of blood or coffee ground material  New chest pain or pain under the shoulder blades  Painful or persistently difficult swallowing  New shortness of breath  Fever of 100F or higher  Black, tarry-looking stools  For urgent or emergent issues, a gastroenterologist can be reached at any hour by calling [phone].   DIET: Dilation diet- see handout.  Drink plenty of fluids but you should avoid alcoholic beverages for 24 hours.  ACTIVITY:  You should plan to take it easy for the rest of today and you should NOT DRIVE or use heavy machinery until tomorrow (because of the sedation medicines used during the test).    FOLLOW UP: Our staff will call the number listed on your records the next business day following your procedure to check on you and address any questions or concerns that you may have regarding the information given to you following your procedure. If we do not reach you, we will leave a  message.  However, if you are feeling well and you are not experiencing any problems, there is no need to return our call.  We will assume that you have returned to your regular daily activities without incident.  SIGNATURES/CONFIDENTIALITY: You and/or your care partner have signed paperwork which will be entered into your electronic medical record.  These signatures attest to the fact that that the information above on your After Visit Summary has been reviewed and is understood.  Full responsibility of the confidentiality of this discharge information lies with you and/or your care-partner.  FOLLOW DILATION DIET TODAY- SEE HANDOUT  Resume normal medications  Please read over handouts about hiatal hernias and strictures  Follow up with Dr. Ardis Hughs as needed

## 2017-12-08 NOTE — Op Note (Addendum)
[facility] Patient Name: Linda Buckley Procedure Date: 12/08/2017 7:59 AM MRN: [id number] Endoscopist: Milus Banister , MD Age: 61 Referring MD:  Date of Birth: 1956/04/20 Gender: Female Account #: [id number] Procedure:                Upper GI endoscopy Indications:              Dysphagia Medicines:                Monitored Anesthesia Care Procedure:                Pre-Anesthesia Assessment:                           - Prior to the procedure, a History and Physical                            was performed, and patient medications and                            allergies were reviewed. The patient's tolerance of                            previous anesthesia was also reviewed. The risks                            and benefits of the procedure and the sedation                            options and risks were discussed with the patient.                            All questions were answered, and informed consent                            was obtained. Prior Anticoagulants: The patient has                            taken no previous anticoagulant or antiplatelet                            agents. ASA Grade Assessment: II - A patient with                            mild systemic disease. After reviewing the risks                            and benefits, the patient was deemed in                            satisfactory condition to undergo the procedure.                           After obtaining informed consent, the endoscope was  passed under direct vision. Throughout the                            procedure, the patient's blood pressure, pulse, and                            oxygen saturations were monitored continuously. The                            Endoscope was introduced through the mouth, and                            advanced to the second part of duodenum. The upper                            GI endoscopy was accomplished without  difficulty.                            The patient tolerated the procedure well. Scope In: Scope Out: Findings:                 One mild benign-appearing, intrinsic stenosis was                            found at the gastroesophageal junction (focal                            peptic appearing stricture). A TTS dilator was                            passed through the scope. Dilation with an 18-19-20                            mm balloon dilator was performed to 20 mm.                           A small hiatal hernia was present.                           The examined duodenum was normal. Complications:            No immediate complications. Estimated blood loss:                            None. Estimated Blood Loss:     Estimated blood loss: none. Impression:               - Benign (peptic appearing) esophageal stenosis.                            Dilated.                           - Small hiatal hernia.                           -  Normal examined duodenum. Recommendation:           - Patient has a contact number available for                            emergencies. The signs and symptoms of potential                            delayed complications were discussed with the                            patient. Return to normal activities tomorrow.                            Written discharge instructions were provided to the                            patient.                           - Resume previous diet.                           - Continue present medications.                           - Repeat upper endoscopy PRN for retreatment. Milus Banister, MD 12/08/2017 8:14:41 AM This report has been signed electronically.

## 2017-12-08 NOTE — Progress Notes (Signed)
Report to PACU, RN, vss, BBS= Clear.  

## 2017-12-08 NOTE — Progress Notes (Signed)
Called to room to assist during endoscopic procedure.  Patient ID and intended procedure confirmed with present staff. Received instructions for my participation in the procedure from the performing physician.  

## 2017-12-11 ENCOUNTER — Telehealth: Payer: Self-pay

## 2017-12-11 NOTE — Telephone Encounter (Signed)
No name or number identifier, left a message x 2.

## 2017-12-11 NOTE — Telephone Encounter (Signed)
No name or number identifier, left a message.

## 2017-12-12 ENCOUNTER — Other Ambulatory Visit: Payer: Self-pay | Admitting: Family Medicine

## 2018-01-30 ENCOUNTER — Ambulatory Visit: Payer: BLUE CROSS/BLUE SHIELD | Admitting: Neurology

## 2018-02-06 ENCOUNTER — Other Ambulatory Visit: Payer: Self-pay | Admitting: Family Medicine

## 2018-02-19 ENCOUNTER — Other Ambulatory Visit: Payer: Self-pay | Admitting: Family Medicine

## 2018-02-20 ENCOUNTER — Other Ambulatory Visit: Payer: Self-pay | Admitting: Adult Health

## 2018-02-25 HISTORY — PX: SPINAL CORD STIMULATOR IMPLANT: SHX2422

## 2018-04-08 ENCOUNTER — Telehealth: Payer: Self-pay | Admitting: Neurology

## 2018-04-08 ENCOUNTER — Telehealth: Payer: Self-pay | Admitting: Family Medicine

## 2018-04-08 NOTE — Telephone Encounter (Signed)
Pt has called to inform that due to cost of her pregabalin (LYRICA) 200 MG capsule being so high the pharmacy suggested that she contact Dr Tobey Grim office and ask if he has any coupons.  Please call

## 2018-04-08 NOTE — Telephone Encounter (Addendum)
I called [facility] [place]. They ran prescription through insurance and it is showing no charge for prescription. She had a deductible previously to meet w/ insurance which now looks she has met. They will get prescription ready for pt.   I called pt and relayed above information. She verbalized understanding and appreciation for call.

## 2018-04-08 NOTE — Telephone Encounter (Signed)
Copied from [facility]. Topic: Quick Communication - See Telephone Encounter >> Apr 08, 2018  3:13 PM Bea Graff, NT wrote: CRM for notification. See Telephone encounter for: 04/08/18. Pt calling to see if the office has any Lyrica coupons? She is unable to afford her medication at this time.   Pt calling back and states her neurologist took care of previous message.

## 2018-04-08 NOTE — Telephone Encounter (Signed)
error 

## 2018-04-08 NOTE — Telephone Encounter (Signed)
Message was also sent to Neurology as the sent in the RX for this.  They have documented on this for patient.

## 2018-04-10 ENCOUNTER — Other Ambulatory Visit: Payer: Self-pay | Admitting: Family Medicine

## 2018-04-13 NOTE — Telephone Encounter (Signed)
Last OV 11/12/17 Clonazepam last filled 04/02/17 #30 with 3

## 2018-04-29 ENCOUNTER — Telehealth: Payer: Self-pay | Admitting: Family Medicine

## 2018-04-29 NOTE — Telephone Encounter (Signed)
Can you please schedule pt an appt. I have papers at my desk.

## 2018-04-29 NOTE — Telephone Encounter (Signed)
Pt will need appt to discuss weaning benzos as requested by pain management

## 2018-04-29 NOTE — Telephone Encounter (Signed)
Received documents from [facility]. Asked that the documents be reviewed, signed and faxed back. Placed in front bin with charge sheet.

## 2018-04-29 NOTE — Telephone Encounter (Signed)
Paperwork given to PCP.  

## 2018-04-30 NOTE — Telephone Encounter (Signed)
Pt's appoitnment on 5/20 changed to 30 min, ok per tabori.

## 2018-05-01 ENCOUNTER — Other Ambulatory Visit: Payer: Self-pay | Admitting: Family Medicine

## 2018-05-01 DIAGNOSIS — Z1231 Encounter for screening mammogram for malignant neoplasm of breast: Secondary | ICD-10-CM

## 2018-05-04 ENCOUNTER — Encounter: Payer: Self-pay | Admitting: Family Medicine

## 2018-05-04 ENCOUNTER — Other Ambulatory Visit: Payer: Self-pay

## 2018-05-04 ENCOUNTER — Emergency Department (HOSPITAL_COMMUNITY)
Admission: EM | Admit: 2018-05-04 | Discharge: 2018-05-04 | Disposition: A | Discharge status: Other Acute Inpatient Hospital | Payer: BLUE CROSS/BLUE SHIELD | Attending: Emergency Medicine | Admitting: Emergency Medicine

## 2018-05-04 ENCOUNTER — Inpatient Hospital Stay (HOSPITAL_COMMUNITY)
Admission: AD | Admit: 2018-05-04 | Discharge: 2018-05-08 | DRG: 885 | Disposition: A | Discharge status: Home / Self Care | Payer: BLUE CROSS/BLUE SHIELD | Source: Intra-hospital | Attending: Psychiatry | Admitting: Psychiatry

## 2018-05-04 ENCOUNTER — Ambulatory Visit (INDEPENDENT_AMBULATORY_CARE_PROVIDER_SITE_OTHER): Payer: BLUE CROSS/BLUE SHIELD | Admitting: Family Medicine

## 2018-05-04 ENCOUNTER — Encounter (HOSPITAL_COMMUNITY): Payer: Self-pay

## 2018-05-04 VITALS — BP 128/82 | HR 80 | Temp 98.6°F | Resp 16 | Ht 66.75 in | Wt 256.6 lb

## 2018-05-04 DIAGNOSIS — E559 Vitamin D deficiency, unspecified: Secondary | ICD-10-CM | POA: Diagnosis present

## 2018-05-04 DIAGNOSIS — Z79899 Other long term (current) drug therapy: Secondary | ICD-10-CM | POA: Insufficient documentation

## 2018-05-04 DIAGNOSIS — Z801 Family history of malignant neoplasm of trachea, bronchus and lung: Secondary | ICD-10-CM

## 2018-05-04 DIAGNOSIS — F329 Major depressive disorder, single episode, unspecified: Secondary | ICD-10-CM | POA: Diagnosis present

## 2018-05-04 DIAGNOSIS — Z800 Family history of malignant neoplasm of digestive organs: Secondary | ICD-10-CM

## 2018-05-04 DIAGNOSIS — Z91030 Bee allergy status: Secondary | ICD-10-CM | POA: Diagnosis not present

## 2018-05-04 DIAGNOSIS — Z886 Allergy status to analgesic agent status: Secondary | ICD-10-CM | POA: Diagnosis not present

## 2018-05-04 DIAGNOSIS — M797 Fibromyalgia: Secondary | ICD-10-CM | POA: Diagnosis present

## 2018-05-04 DIAGNOSIS — Z90710 Acquired absence of both cervix and uterus: Secondary | ICD-10-CM

## 2018-05-04 DIAGNOSIS — Z8261 Family history of arthritis: Secondary | ICD-10-CM | POA: Diagnosis not present

## 2018-05-04 DIAGNOSIS — G4733 Obstructive sleep apnea (adult) (pediatric): Secondary | ICD-10-CM | POA: Diagnosis present

## 2018-05-04 DIAGNOSIS — F4310 Post-traumatic stress disorder, unspecified: Secondary | ICD-10-CM | POA: Diagnosis present

## 2018-05-04 DIAGNOSIS — R45851 Suicidal ideations: Secondary | ICD-10-CM | POA: Diagnosis present

## 2018-05-04 DIAGNOSIS — Z8041 Family history of malignant neoplasm of ovary: Secondary | ICD-10-CM | POA: Diagnosis not present

## 2018-05-04 DIAGNOSIS — Z008 Encounter for other general examination: Secondary | ICD-10-CM | POA: Insufficient documentation

## 2018-05-04 DIAGNOSIS — Z803 Family history of malignant neoplasm of breast: Secondary | ICD-10-CM

## 2018-05-04 DIAGNOSIS — Z833 Family history of diabetes mellitus: Secondary | ICD-10-CM

## 2018-05-04 DIAGNOSIS — I10 Essential (primary) hypertension: Secondary | ICD-10-CM | POA: Diagnosis present

## 2018-05-04 DIAGNOSIS — F419 Anxiety disorder, unspecified: Secondary | ICD-10-CM | POA: Insufficient documentation

## 2018-05-04 DIAGNOSIS — G609 Hereditary and idiopathic neuropathy, unspecified: Secondary | ICD-10-CM | POA: Diagnosis present

## 2018-05-04 DIAGNOSIS — F331 Major depressive disorder, recurrent, moderate: Secondary | ICD-10-CM | POA: Diagnosis present

## 2018-05-04 DIAGNOSIS — Z62820 Parent-biological child conflict: Secondary | ICD-10-CM | POA: Diagnosis not present

## 2018-05-04 DIAGNOSIS — Z825 Family history of asthma and other chronic lower respiratory diseases: Secondary | ICD-10-CM | POA: Diagnosis not present

## 2018-05-04 DIAGNOSIS — Z720 Tobacco use: Secondary | ICD-10-CM

## 2018-05-04 DIAGNOSIS — Z818 Family history of other mental and behavioral disorders: Secondary | ICD-10-CM | POA: Diagnosis not present

## 2018-05-04 DIAGNOSIS — F332 Major depressive disorder, recurrent severe without psychotic features: Secondary | ICD-10-CM | POA: Diagnosis present

## 2018-05-04 LAB — COMPREHENSIVE METABOLIC PANEL
ALBUMIN: 3.8 g/dL (ref 3.5–5.0)
ALT: 24 U/L (ref 14–54)
ANION GAP: 10 (ref 5–15)
AST: 25 U/L (ref 15–41)
Alkaline Phosphatase: 83 U/L (ref 38–126)
BILIRUBIN TOTAL: 0.6 mg/dL (ref 0.3–1.2)
BUN: 14 mg/dL (ref 6–20)
CHLORIDE: 105 mmol/L (ref 101–111)
CO2: 24 mmol/L (ref 22–32)
Calcium: 9.5 mg/dL (ref 8.9–10.3)
Creatinine, Ser: 0.98 mg/dL (ref 0.44–1.00)
GFR calc Af Amer: >60 mL/min (ref >60)
GLUCOSE: 105 mg/dL — AB (ref 65–99)
POTASSIUM: 3.9 mmol/L (ref 3.5–5.1)
Sodium: 139 mmol/L (ref 135–145)
TOTAL PROTEIN: 7.3 g/dL (ref 6.5–8.1)

## 2018-05-04 LAB — ETHANOL

## 2018-05-04 LAB — CBC
HCT: 38.9 % (ref 36.0–46.0)
Hemoglobin: 12.4 g/dL (ref 12.0–15.0)
MCH: 29.5 pg (ref 26.0–34.0)
MCHC: 31.9 g/dL (ref 30.0–36.0)
MCV: 92.6 fL (ref 78.0–100.0)
PLATELETS: 234 10*3/uL (ref 150–400)
RBC: 4.2 MIL/uL (ref 3.87–5.11)
RDW: 13.3 % (ref 11.5–15.5)
WBC: 4.5 10*3/uL (ref 4.0–10.5)

## 2018-05-04 LAB — ACETAMINOPHEN LEVEL: Acetaminophen (Tylenol), Serum: <10 ug/mL — ABNORMAL LOW (ref 10–30)

## 2018-05-04 LAB — SALICYLATE LEVEL: Salicylate Lvl: <7 mg/dL (ref 2.8–30.0)

## 2018-05-04 MED ORDER — BECLOMETHASONE DIPROP HFA 80 MCG/ACT IN AERB: 1.0000 | INHALATION_SPRAY | Freq: Two times a day (BID) | RESPIRATORY_TRACT | Status: DC

## 2018-05-04 MED ORDER — BUPROPION HCL ER (XL) 150 MG PO TB24
300.0000 mg | ORAL_TABLET | Freq: Every day | ORAL | Status: DC
  Administered 2018-05-04: 300 mg via ORAL
  Filled 2018-05-04: qty 2

## 2018-05-04 MED ORDER — HYDROCHLOROTHIAZIDE 12.5 MG PO CAPS
12.5000 mg | ORAL_CAPSULE | Freq: Every day | ORAL | Status: DC
  Administered 2018-05-04: 12.5 mg via ORAL
  Filled 2018-05-04: qty 1

## 2018-05-04 MED ORDER — ONDANSETRON HCL 4 MG PO TABS: 4.0000 mg | ORAL_TABLET | Freq: Three times a day (TID) | ORAL | Status: DC | PRN

## 2018-05-04 MED ORDER — VITAMIN D3 25 MCG (1000 UNIT) PO TABS
1000.0000 [IU] | ORAL_TABLET | Freq: Every day | ORAL | Status: DC
  Administered 2018-05-04: 1000 [IU] via ORAL
  Filled 2018-05-04: qty 1

## 2018-05-04 MED ORDER — OXYCODONE HCL 5 MG PO TABS
10.0000 mg | ORAL_TABLET | Freq: Two times a day (BID) | ORAL | Status: DC | PRN
Start: 2018-05-04 — End: 2018-05-04
  Administered 2018-05-04: 10 mg via ORAL
  Filled 2018-05-04: qty 2

## 2018-05-04 MED ORDER — CLOTRIMAZOLE-BETAMETHASONE 1-0.05 % EX CREA
1.0000 "application " | TOPICAL_CREAM | Freq: Two times a day (BID) | CUTANEOUS | Status: DC | PRN
  Filled 2018-05-04: qty 15

## 2018-05-04 MED ORDER — BUDESONIDE 0.25 MG/2ML IN SUSP
0.2500 mg | Freq: Two times a day (BID) | RESPIRATORY_TRACT | Status: DC
  Filled 2018-05-04 (×2): qty 2

## 2018-05-04 MED ORDER — ALUM & MAG HYDROXIDE-SIMETH 200-200-20 MG/5ML PO SUSP: 30.0000 mL | Freq: Four times a day (QID) | ORAL | Status: DC | PRN

## 2018-05-04 MED ORDER — NICOTINE 21 MG/24HR TD PT24: 21.0000 mg | MEDICATED_PATCH | Freq: Every day | TRANSDERMAL | Status: DC

## 2018-05-04 MED ORDER — BECLOMETHASONE DIPROP HFA 80 MCG/ACT IN AERB
1.0000 | INHALATION_SPRAY | Freq: Two times a day (BID) | RESPIRATORY_TRACT | Status: DC
Start: 2018-05-04 — End: 2018-05-04

## 2018-05-04 MED ORDER — PREGABALIN 50 MG PO CAPS
200.0000 mg | ORAL_CAPSULE | Freq: Two times a day (BID) | ORAL | Status: DC
  Administered 2018-05-04: 200 mg via ORAL
  Filled 2018-05-04: qty 4

## 2018-05-04 MED ORDER — ACETAMINOPHEN 325 MG PO TABS: 650.0000 mg | ORAL_TABLET | ORAL | Status: DC | PRN

## 2018-05-04 MED ORDER — LOSARTAN POTASSIUM-HCTZ 50-12.5 MG PO TABS: 1.0000 | ORAL_TABLET | Freq: Every day | ORAL | Status: DC

## 2018-05-04 MED ORDER — TRAZODONE HCL 50 MG PO TABS: 25.0000 mg | ORAL_TABLET | Freq: Every evening | ORAL | Status: DC | PRN

## 2018-05-04 MED ORDER — DULOXETINE HCL 30 MG PO CPEP
60.0000 mg | ORAL_CAPSULE | Freq: Two times a day (BID) | ORAL | Status: DC
  Administered 2018-05-04: 60 mg via ORAL
  Filled 2018-05-04: qty 2

## 2018-05-04 MED ORDER — LOSARTAN POTASSIUM 50 MG PO TABS
50.0000 mg | ORAL_TABLET | Freq: Every day | ORAL | Status: DC
  Administered 2018-05-04: 50 mg via ORAL
  Filled 2018-05-04: qty 1

## 2018-05-04 MED ORDER — VITAMIN E 45 MG (100 UNIT) PO CAPS
1000.0000 [IU] | ORAL_CAPSULE | Freq: Every day | ORAL | Status: DC
  Administered 2018-05-04: 1000 [IU] via ORAL
  Filled 2018-05-04: qty 2

## 2018-05-04 MED ORDER — ALBUTEROL SULFATE HFA 108 (90 BASE) MCG/ACT IN AERS: 1.0000 | INHALATION_SPRAY | RESPIRATORY_TRACT | Status: DC | PRN

## 2018-05-04 MED ORDER — LORATADINE 10 MG PO TABS
10.0000 mg | ORAL_TABLET | Freq: Every day | ORAL | Status: DC
  Administered 2018-05-04: 10 mg via ORAL
  Filled 2018-05-04: qty 1

## 2018-05-04 MED ORDER — FUROSEMIDE 20 MG PO TABS
20.0000 mg | ORAL_TABLET | Freq: Every day | ORAL | Status: DC
  Filled 2018-05-04: qty 1

## 2018-05-04 MED ORDER — DICLOFENAC SODIUM 1 % TD GEL
4.0000 g | Freq: Four times a day (QID) | TRANSDERMAL | Status: DC
  Administered 2018-05-04: 4 g via TOPICAL
  Filled 2018-05-04: qty 100

## 2018-05-04 NOTE — Assessment & Plan Note (Signed)
Ongoing issue for pt.  Not able to discuss or assess today due to mental health crisis.  Will follow at a more appropriate time

## 2018-05-04 NOTE — Progress Notes (Signed)
   Subjective:    Patient ID: Linda Buckley, female    DOB: 12-Dec-1956, 62 y.o.   MRN: [id number]  HPI HTN- chronic problem, on Losartan HCTZ 50/12.5mg  daily, Lasix 20mg  daily w/ good control.  Pt is having some chest tightness w/ anxiety.  Nuclear stress last year was normal.  + HAs, no edema.  Obesity- pt's BMI is now 40.5  No regular exercise.  Not eating regularly.  'if I have lunch, that's all that I eat all day'.  Depression- chronic problem, on Wellbutrin 300mg  daily, Cymbalta 60mg  BID.  Lost her house in November, lost her car in March- 'but I managed to get that back'.  Pt is not able to work due to chronic back issues and wrist/hand pain.  'more depressed than before'.  Recently resumed going to church.  'Not interested in being around people'.  'it seems like nothing's working'.  Son is back on drugs.  Admits to shutting out her family.   Review of Systems For ROS see HPI     Objective:   Physical Exam  Constitutional: She appears well-developed and well-nourished. She appears distressed.  obese  Skin: Skin is warm and dry.  Psychiatric: Her behavior is normal.  Tearful- sobbing at times Anxious + SI Unable to contract for safety Not able to tell me if she has a plan or not  Vitals reviewed.         Assessment & Plan:

## 2018-05-04 NOTE — Assessment & Plan Note (Signed)
Deteriorated.  Pt is severely depressed and now having thoughts of harming herself.  Unable to contract for safety.  Not able to tell me if she has a plan.  Has been shutting out her family.  Lives alone.  I do not feel it is safe to send her home.  She is sobbing- uncontrollably at time- and even if she was safe to return home she is not safe to drive.  Pt gave me permission to call her sister and explain the situation.  I recommend Behavioral Health admission and explained the medical clearance process at [facility] first.  Pt is agreeable to wait for family member.  Had staff sit with her until family arrived and called [facility] charge Therapist, sports.  Pt en route to [facility] w/ sister.

## 2018-05-04 NOTE — ED Notes (Signed)
Patient pleasant and cooperative with care at this time. Patient with no distress noted and she is able to verbally contract for safety.

## 2018-05-04 NOTE — BH Assessment (Signed)
[facility] Assessment Progress Note Case was staffed with Romilda Garret FNP who recommended a impatent admission to assist with stabilization as appropriate bed placement is investigated.

## 2018-05-04 NOTE — ED Notes (Signed)
Coming from PCP-was seen there for HTN-states patient was voicing SI-sending her here for eval

## 2018-05-04 NOTE — Progress Notes (Signed)
Patient ID: KYLANI WIRES, female   DOB: 1956/03/14, 62 y.o.   MRN: [id number]   [name] is a 62 year old that come in tonight after going to her doctor today and making some statements about being better off dead. She contracted with her physician to go to the ED. She reports increased depression for which she relates to all her medical and financial problems. Does admit to [facility] but no plan. Reports a chronic history of fibromyalgia and back issues. Reports going to a pain clinic in [facility]) for this. Reports that she had a spinal cord stimulator implanted recently but doesn't have the controller for it here. She receives Lyrica and Oxy IR from pain clinic. She also has HTN, Asthma, and sleep apnea. Multiple surgeries in her history along with multiple visits to physician's offices in recent years according to records. She is married with children grown. No substance abuse history. She reports unable to work and disability has not gone through yet. She reports losing her house and now has to live in an apartment. Does report having a gun in the home and says husband is locking up. This is her first inpatient admission here. Cooperative with admission process.

## 2018-05-04 NOTE — Assessment & Plan Note (Signed)
Adequate control.  Unable to get labs or fully assess today due to her mental health crisis.  Will follow closely.

## 2018-05-04 NOTE — ED Provider Notes (Signed)
[facility] DEPT Provider Note   CSN: [id number] Arrival date & time: 05/04/18  1221     History   Chief Complaint Chief Complaint  Patient presents with  . Suicidal    HPI Linda Buckley is a 62 y.o. female with a PMHx of fibromyalgia, depression, migraines, HTN, and other conditions listed below, who presents to the ED with complaints of suicidal ideations without a plan.  Patient states that she has had some overwhelming stuff going on in her life lately and she has been feeling more depressed.  She also states that she feels anxious.  She denies HI, AVH, or illicit drug use.  She drinks a glass of wine yesterday, but that was the first time that she drink alcohol in 6 months, she drinks very infrequently.  She recently started smoking cigarettes because of the stress in her life.  She is compliant with all of her medications, although she did not take her medications this morning because she had an appointment with her PCP and did not want to be drowsy for the appointment.  Specifically she takes Wellbutrin 300 mg daily and Cymbalta 60 mg twice daily for depression and fibromyalgia, both of which she took yesterday.  She also takes trazodone 25 to 50 mg at night as needed for sleep.  She denies any acute medical complaints at this time and is here voluntarily.  The history is provided by the patient and medical records. No language interpreter was used.    Past Medical History:  Diagnosis Date  . Allergy   . Anxiety   . Arthritis   . Carpal tunnel syndrome of right wrist    RECURRENT  . Depression   . Dysphagia 09/02/2017  . Edema 09/02/2017  . Environmental allergies    dust mites, pollen, roaches and other insects, mold  . Headache    migraines  . Hereditary and idiopathic peripheral neuropathy 04/03/2016  . History of adenomatous polyp of colon   . History of gastric ulcer   . History of kidney stones   . Hypertension   . Pneumonia   .  Sleep apnea    wears CPAP  . Wears glasses     Patient Active Problem List   Diagnosis Date Noted  . Vitamin D deficiency 11/05/2017  . Dysphagia 09/02/2017  . OSA (obstructive sleep apnea) 02/14/2017  . Multiple falls 09/04/2016  . Hereditary and idiopathic peripheral neuropathy 04/03/2016  . Exertional shortness of breath 01/26/2016  . Left leg pain 01/26/2016  . Trochanteric bursitis of both hips 12/19/2015  . Depression 08/28/2015  . Fibromyalgia 07/28/2015  . Right wrist pain 04/05/2015  . Routine general medical examination at a health care facility 03/01/2014  . HTN (hypertension) 01/12/2014  . Morbid obesity ([place]) 01/12/2014  . Pre-diabetes 01/12/2014  . Family history of lupus erythematosus 01/12/2014  . Fatigue 10/17/2013    Past Surgical History:  Procedure Laterality Date  . CARPAL TUNNEL RELEASE Right 2007  . CARPAL TUNNEL RELEASE Right 07/05/2015   Procedure: RIGHT HAND REVISION CARPAL TUNNEL RELEASE;  Surgeon: Iran Planas, MD;  Location: [facility];  Service: Orthopedics;  Laterality: Right;  . CARPAL TUNNEL RELEASE Left 09/2016  . CERVICAL DISC ARTHROPLASTY  03/2015  . COLONOSCOPY W/ POLYPECTOMY  04-15-2014  . CYSTO/  RIGHT RETROGRADE PYELOGRAM/ URETEROSCOPY STONE EXTRACTION/  STENT PLACEMENT  06-07-2010  . LAPAROSCOPIC ASSISTED VAGINAL HYSTERECTOMY  04-25-2004   ovaries remain  . [place]  OB History   None      Home Medications    Prior to Admission medications   Medication Sig Start Date End Date Taking? Authorizing Provider  beclomethasone (QVAR REDIHALER) 80 MCG/ACT inhaler Inhale 1 puff into the lungs 2 (two) times daily. 09/09/17   Midge Minium, MD  buPROPion (WELLBUTRIN XL) 300 MG 24 hr tablet TAKE 1 TABLET(300 MG) BY MOUTH DAILY 12/01/17   Midge Minium, MD  cetirizine (ZYRTEC) 10 MG tablet Take 1 tablet (10 mg total) by mouth daily. 11/05/17   Midge Minium, MD  Cholecalciferol (VITAMIN D PO) Take 1 tablet by  mouth daily. 1000units    [provider]  clonazePAM (KLONOPIN) 0.5 MG tablet TAKE 1 TABLET BY MOUTH AT BEDTIME 04/13/18   Midge Minium, MD  clotrimazole-betamethasone (LOTRISONE) cream Apply 1 application topically 2 (two) times daily. 06/02/17   Midge Minium, MD  diclofenac sodium (VOLTAREN) 1 % GEL Apply 4 g topically 4 (four) times daily. 09/08/17   Midge Minium, MD  DULoxetine (CYMBALTA) 60 MG capsule TAKE ONE CAPSULE BY MOUTH TWICE DAILY 02/20/18   Kathrynn Ducking, MD  EPINEPHrine (EPIPEN) 0.3 mg/0.3 mL SOAJ injection Inject 0.3 mg into the muscle once.    [provider]  furosemide (LASIX) 20 MG tablet TAKE 1 TABLET(20 MG) BY MOUTH DAILY 02/19/18   Midge Minium, MD  losartan-hydrochlorothiazide [facility]) 50-12.5 MG tablet TAKE 1 TABLET BY MOUTH EVERY DAY 02/06/18   Midge Minium, MD  omeprazole (PRILOSEC) 40 MG capsule Take 1 capsule (40 mg total) by mouth daily. 11/10/17 12/10/17  Milus Banister, MD  omeprazole (PRILOSEC) 40 MG capsule  02/15/18   [provider]  Oxycodone HCl 10 MG TABS TK 1 T PO Q 8 H PRN P 09/05/17   [provider]  pregabalin (LYRICA) 200 MG capsule Take 1 capsule (200 mg total) by mouth 2 (two) times daily. 12/01/17   Kathrynn Ducking, MD  ranitidine (ZANTAC) 300 MG tablet TAKE 1 TABLET(300 MG) BY MOUTH AT BEDTIME 01/07/17   Midge Minium, MD  traZODone (DESYREL) 50 MG tablet TAKE 1/2 TO 1 TABLET(25 TO 50 MG) BY MOUTH AT BEDTIME AS NEEDED FOR SLEEP Patient not taking: Reported on 05/04/2018 12/01/17   Midge Minium, MD  VENTOLIN HFA 108 [phone] Base) MCG/ACT inhaler INHALE 2 PUFFS BY MOUTH EVERY 4 HOURS AS NEEDED FOR SHORTNESS OF BREATH Patient not taking: Reported on 05/04/2018 11/26/17   Midge Minium, MD  vitamin E 1000 UNIT capsule Take by mouth.    [provider]    Family History Family History  Problem Relation Age of Onset  . Asthma Father   . Diabetes Father   .  Arthritis Mother   . Hypertension Mother   . Diabetes Mother   . Cancer Mother   . Stomach cancer Mother   . Diabetes Brother   . Diabetes Brother   . Asthma Son   . Cancer Maternal Grandfather        colon  . Colon cancer Maternal Grandfather   . Cancer Maternal Aunt        breast  . Cancer Maternal Uncle        lung  . Colon cancer Maternal Uncle   . Cancer Maternal Grandmother        ovarian  . Diabetes Son     Social History Social History   Tobacco Use  . Smoking status: Never Smoker  .  Smokeless tobacco: Never Used  Substance Use Topics  . Alcohol use: Yes    Comment: 2 glasses of wine weekly  . Drug use: No     Allergies   Aspirin and Bee venom   Review of Systems Review of Systems  Constitutional: Negative for chills and fever.  Respiratory: Negative for shortness of breath.   Cardiovascular: Negative for chest pain.  Gastrointestinal: Negative for abdominal pain, constipation, diarrhea, nausea and vomiting.  Genitourinary: Negative for dysuria and hematuria.  Musculoskeletal: Negative for arthralgias and myalgias (chronic from fibromyalgia, nothing acute).  Skin: Negative for color change.  Allergic/Immunologic: Negative for immunocompromised state.  Neurological: Negative for weakness and numbness.  Psychiatric/Behavioral: Positive for suicidal ideas. Negative for confusion and hallucinations. The patient is nervous/anxious.    All other systems reviewed and are negative for acute change except as noted in the HPI.    Physical Exam Updated Vital Signs BP (!) 148/91 (BP Location: Left Arm)   Pulse 86   Temp 98 F (36.7 C) (Oral)   Resp 18   SpO2 98%   Physical Exam  Constitutional: She is oriented to person, place, and time. Vital signs are normal. She appears well-developed and well-nourished.  Non-toxic appearance. No distress.  Afebrile, nontoxic, NAD  HENT:  Head: Normocephalic and atraumatic.  Mouth/Throat: Oropharynx is clear and moist  and mucous membranes are normal.  Eyes: Conjunctivae and EOM are normal. Right eye exhibits no discharge. Left eye exhibits no discharge.  Neck: Normal range of motion. Neck supple.  Cardiovascular: Normal rate, regular rhythm, normal heart sounds and intact distal pulses. Exam reveals no gallop and no friction rub.  No murmur heard. Pulmonary/Chest: Effort normal and breath sounds normal. No respiratory distress. She has no decreased breath sounds. She has no wheezes. She has no rhonchi. She has no rales.  Abdominal: Soft. Normal appearance and bowel sounds are normal. She exhibits no distension. There is no tenderness. There is no rigidity, no rebound, no guarding, no CVA tenderness, no tenderness at McBurney's point and negative Murphy's sign.  Musculoskeletal: Normal range of motion.  Neurological: She is alert and oriented to person, place, and time. She has normal strength. No sensory deficit.  Skin: Skin is warm, dry and intact. No rash noted.  Psychiatric: She is not actively hallucinating. She exhibits a depressed mood. She expresses suicidal ideation. She expresses no homicidal ideation. She expresses no suicidal plans and no homicidal plans.  Depressed affect, but pleasant and cooperative. Endorsing SI without a plan, denies HI or AVH, doesn't seem to be responding to internal stimuli.   Nursing note and vitals reviewed.    ED Treatments / Results  Labs (all labs ordered are listed, but only abnormal results are displayed) Labs Reviewed  COMPREHENSIVE METABOLIC PANEL - Abnormal; Notable for the following components:      Result Value   Glucose, Bld 105 (*)    All other components within normal limits  ACETAMINOPHEN LEVEL - Abnormal; Notable for the following components:   Acetaminophen (Tylenol), Serum <10 (*)    All other components within normal limits  ETHANOL  SALICYLATE LEVEL  CBC  RAPID URINE DRUG SCREEN, HOSP PERFORMED    EKG None  Radiology No results  found.  Procedures Procedures (including critical care time)  Medications Ordered in ED Medications  beclomethasone (QVAR) 80 MCG/ACT inhaler 1 puff (has no administration in time range)  buPROPion (WELLBUTRIN XL) 24 hr tablet 300 mg (has no administration in time range)  loratadine (  CLARITIN) tablet 10 mg (has no administration in time range)  cholecalciferol (VITAMIN D) tablet 1,000 Units (has no administration in time range)  clotrimazole-betamethasone (LOTRISONE) cream 1 application (has no administration in time range)  diclofenac sodium (VOLTAREN) 1 % transdermal gel 4 g (has no administration in time range)  DULoxetine (CYMBALTA) DR capsule 60 mg (has no administration in time range)  furosemide (LASIX) tablet 20 mg (has no administration in time range)  losartan-hydrochlorothiazide (HYZAAR) 50-12.5 MG per tablet 1 tablet (has no administration in time range)  Oxycodone HCl TABS 10 mg (has no administration in time range)  pregabalin (LYRICA) capsule 200 mg (has no administration in time range)  traZODone (DESYREL) tablet 25-50 mg (has no administration in time range)  albuterol (PROVENTIL HFA;VENTOLIN HFA) 108 (90 Base) MCG/ACT inhaler 1-2 puff (has no administration in time range)  vitamin E capsule 1,000 Units (has no administration in time range)  acetaminophen (TYLENOL) tablet 650 mg (has no administration in time range)  ondansetron (ZOFRAN) tablet 4 mg (has no administration in time range)  alum & mag hydroxide-simeth (MAALOX/MYLANTA) 200-200-20 MG/5ML suspension 30 mL (has no administration in time range)  nicotine (NICODERM CQ - dosed in mg/24 hours) patch 21 mg (has no administration in time range)     Initial Impression / Assessment and Plan / ED Course  I have reviewed the triage vital signs and the nursing notes.  Pertinent labs & imaging results that were available during my care of the patient were reviewed by me and considered in my medical decision making (see  chart for details).     62 y.o. female here with SI without a plan, states she's been overwhelmed with things in her life recently. Reports anxiety as well. Denies HI/AVH, no illicit drug use. Just started smoking cigarettes recently, smoking cessation was strongly encouraged. Drank a glass of wine yesterday but doesn't drink normally (this was the first time in 65mos). Compliant with her meds, although she didn't take them this AM because she had an appt with her PCP. No acute medical complaints at this time, here voluntarily. Physical exam benign aside from depressed mood. Will get psych clearance labs and TTS consult, then reassess.   4:08 PM CBC WNL. CMP WNL. EtOH level undetectable. Salicylate and acetaminophen levels WNL. UDS pending, but does not interfere with med clearance. Pt medically cleared at this time. Psych hold orders and home med orders placed. Please see TTS notes for further documentation of care/dispo. PLEASE NOTE THAT PT IS HERE VOLUNTARILY AT THIS TIME, IF PT TRIES TO LEAVE THEY WOULD NEED IVC PAPERWORK TAKEN OUT. Pt stable at time of med clearance.     Final Clinical Impressions(s) / ED Diagnoses   Final diagnoses:  Suicidal ideation  Medical clearance for psychiatric admission  Tobacco user  Anxiety    ED Discharge Orders    [address] [place], [place] 05/04/18 1609    Davonna Belling, MD 05/04/18 1630

## 2018-05-04 NOTE — ED Notes (Signed)
Pt is calm and cooperative.  Pt was oriented to room and unit.  Bed was moved to help with patients back pain.  Pt is resting quietly and monitored for safety.

## 2018-05-04 NOTE — ED Triage Notes (Signed)
Pt comes from her PCP with complaints of suicidal ideation with no plan. Pt is tearful during assessment and states that this last month has been rough and states "I just can't do it anymore". Pt has a family member at the bedside and she stated the pt has been having a lot of pain with her fibromyalgia and has been stressed out about her son's well-being.

## 2018-05-04 NOTE — BH Assessment (Addendum)
Assessment Note  Linda Buckley is an 62 y.o. female that presents this date with thoughts of self harm. Patient reports she currently has intent but no plan. Patient is observed to be very tearful and speaks in a low soft voice. Patient is hesitant to render history and when asked if she had previous attempts at self harm stated "I would rather not say." Patient does report feelings associated with self harm that have worsened over the last six months due to loss of her home and other financial issues. Patient states she currently cannot contact for safety and is "afraid of what she might do if she went home." Patient denies any previous inpatient admissions associated with any psychiatric issues. Patient reports she was diagnosed with depression at [facility] over 3 years ago by her current provider Tarboi PA who assists with medication management. Patient also reports she receives services from the [facility] in [place] who prescribes medications for symptoms associated with Fibromyalgia. Patient reports that her depression has worsened over the past six months with symptoms to include: excessive sadness, family issues, anhedonia and feeling "useless." Patient states her current stressors have also increased her pain management issues and reports she has not "slept more than 3 hours a night" for the past two weeks. Patient also states that she feels her current medications for depression are "no longer working." Patient denies any SA history and UDS is pending. Patient denies any H/I or AVH and is time/place oriented. Patient has limited history per chart review. Per notes, patient comes from her PCP with complaints of suicidal ideation with no plan. Patient is tearful during assessment and states that this last month has been rough and states "I just can't do it anymore". Patient has a family member at the bedside and she stated the patient has been having a lot of pain with her  fibromyalgia and has been stressed out about her son's well-being. Patient reports that she lost her home in November. Patient is not able to work due to chronic back issues and wrist/hand pain. Patient reports she "has never been this depressed before." Patient reports, "it seems like nothing's working". Patient is requesting a voluntary admission to assist with symptoms. Case was staffed with Romilda Garret FNP who recommended a inpatent admission to assist with stabilization as appropriate bed placement is investigated.      Diagnosis: F33.2  MDD recurrent severe without psychotic features.  Past Medical History:  Past Medical History:  Diagnosis Date  . Allergy   . Anxiety   . Arthritis   . Carpal tunnel syndrome of right wrist    RECURRENT  . Depression   . Dysphagia 09/02/2017  . Edema 09/02/2017  . Environmental allergies    dust mites, pollen, roaches and other insects, mold  . Headache    migraines  . Hereditary and idiopathic peripheral neuropathy 04/03/2016  . History of adenomatous polyp of colon   . History of gastric ulcer   . History of kidney stones   . Hypertension   . Pneumonia   . Sleep apnea    wears CPAP  . Wears glasses     Past Surgical History:  Procedure Laterality Date  . CARPAL TUNNEL RELEASE Right 2007  . CARPAL TUNNEL RELEASE Right 07/05/2015   Procedure: RIGHT HAND REVISION CARPAL TUNNEL RELEASE;  Surgeon: Iran Planas, MD;  Location: [place];  Service: Orthopedics;  Laterality: Right;  . CARPAL TUNNEL RELEASE Left 09/2016  . CERVICAL  [place] ARTHROPLASTY  03/2015  . COLONOSCOPY W/ POLYPECTOMY  04-15-2014  . CYSTO/  RIGHT RETROGRADE PYELOGRAM/ URETEROSCOPY STONE EXTRACTION/  STENT PLACEMENT  06-07-2010  . LAPAROSCOPIC ASSISTED VAGINAL HYSTERECTOMY  04-25-2004   ovaries remain  . TUBAL LIGATION  1982    Family History:  Family History  Problem Relation Age of Onset  . Asthma Father   . Diabetes Father   . Arthritis Mother   . Hypertension Mother   .  Diabetes Mother   . Cancer Mother   . Stomach cancer Mother   . Diabetes Brother   . Diabetes Brother   . Asthma Son   . Cancer Maternal Grandfather        colon  . Colon cancer Maternal Grandfather   . Cancer Maternal Aunt        breast  . Cancer Maternal Uncle        lung  . Colon cancer Maternal Uncle   . Cancer Maternal Grandmother        ovarian  . Diabetes Son     Social History:  reports that she has never smoked. She has never used smokeless tobacco. She reports that she drinks alcohol. She reports that she does not use drugs.  Additional Social History:  Alcohol / Drug Use Pain Medications: See MAR Prescriptions: See MAR Over the Counter: See MAR History of alcohol / drug use?: No history of alcohol / drug abuse Longest period of sobriety (when/how long): UTA Negative Consequences of Use: (Denies) Withdrawal Symptoms: (Denies)  CIWA: CIWA-Ar BP: (!) 148/91 Pulse Rate: 86 COWS:    Allergies:  Allergies  Allergen Reactions  . Aspirin Swelling    Sweating/ swelling of hands and face   . Bee Venom Swelling    Swelling at site     Home Medications:  (Not in a hospital admission)  OB/GYN Status:  No LMP recorded. Patient has had a hysterectomy.  General Assessment Data Location of Assessment: [facility] TTS Assessment: In system Is this a Tele or Face-to-Face Assessment?: Face-to-Face Is this an Initial Assessment or a Re-assessment for this encounter?: Initial Assessment Marital status: Married [place] name: NA Is patient pregnant?: No Pregnancy Status: No Living Arrangements: Spouse/significant other Can pt return to current living arrangement?: Yes Admission Status: Voluntary Is patient capable of signing voluntary admission?: Yes Referral Source: Self/Family/Friend Insurance type: Systems developer Exam ([place]) Medical Exam completed: Yes  Crisis Care Plan Living Arrangements: Spouse/significant other Legal Guardian: (NA) Name  of Psychiatrist: None Name of Therapist: None  Education Status Is patient currently in school?: No Is the patient employed, unemployed or receiving disability?: Unemployed  Risk to self with the past 6 months Suicidal Ideation: Yes-Currently Present Has patient been a risk to self within the past 6 months prior to admission? : Yes Suicidal Intent: Yes-Currently Present Has patient had any suicidal intent within the past 6 months prior to admission? : Yes Is patient at risk for suicide?: Yes Suicidal Plan?: No Has patient had any suicidal plan within the past 6 months prior to admission? : No Access to Means: No What has been your use of drugs/alcohol within the last 12 months?: Denies Previous Attempts/Gestures: (Pt declines to answer) How many times?: (Pt declines to answer) Other Self Harm Risks: NA Triggers for Past Attempts: Unknown Intentional Self Injurious Behavior: None Family Suicide History: No Recent stressful life event(s): Other (Comment)(Loss of house, property  ) Persecutory voices/beliefs?: No Depression: Yes Depression Symptoms: Feeling worthless/self  pity Substance abuse history and/or treatment for substance abuse?: No Suicide prevention information given to non-admitted patients: Not applicable  Risk to Others within the past 6 months Homicidal Ideation: No Does patient have any lifetime risk of violence toward others beyond the six months prior to admission? : No Thoughts of Harm to Others: No Current Homicidal Intent: No Current Homicidal Plan: No Access to Homicidal Means: No Identified Victim: NA History of harm to others?: No Assessment of Violence: None Noted Violent Behavior Description: NA Does patient have access to weapons?: No Criminal Charges Pending?: No Does patient have a court date: No Is patient on probation?: No  Psychosis Hallucinations: None noted Delusions: None noted  Mental Status Report Appearance/Hygiene: In scrubs Eye  Contact: Fair Motor Activity: Freedom of movement Speech: Soft, Slow Level of Consciousness: Crying Mood: Depressed Affect: Anxious Anxiety Level: Moderate Thought Processes: Coherent, Relevant Judgement: Unimpaired Orientation: Person, Place, Time Obsessive Compulsive Thoughts/Behaviors: None  Cognitive Functioning Concentration: Good Memory: Recent Intact, Remote Intact Is patient IDD: No Is patient DD?: No Insight: Fair Impulse Control: Fair Appetite: Fair Have you had any weight changes? : No Change Sleep: Decreased Total Hours of Sleep: 4 Vegetative Symptoms: None  ADLScreening [facility] Assessment Services) Patient's cognitive ability adequate to safely complete daily activities?: Yes Patient able to express need for assistance with ADLs?: Yes Independently performs ADLs?: Yes (appropriate for developmental age)  Prior Inpatient Therapy Prior Inpatient Therapy: No  Prior Outpatient Therapy Prior Outpatient Therapy: No Does patient have an ACCT team?: No Does patient have Intensive In-House Services?  : No Does patient have [facility] services? : No Does patient have P4CC services?: No  ADL Screening (condition at time of admission) Patient's cognitive ability adequate to safely complete daily activities?: Yes Is the patient deaf or have difficulty hearing?: No Does the patient have difficulty seeing, even when wearing glasses/contacts?: No Does the patient have difficulty concentrating, remembering, or making decisions?: No Patient able to express need for assistance with ADLs?: Yes Does the patient have difficulty dressing or bathing?: No Independently performs ADLs?: Yes (appropriate for developmental age) Does the patient have difficulty walking or climbing stairs?: No Weakness of Legs: None Weakness of Arms/Hands: None  Home Assistive Devices/Equipment Home Assistive Devices/Equipment: None  Therapy Consults (therapy consults require a physician order) PT  Evaluation Needed: No OT Evalulation Needed: No SLP Evaluation Needed: No Abuse/Neglect Assessment (Assessment to be complete while patient is alone) Physical Abuse: Denies Verbal Abuse: Denies Sexual Abuse: Denies Exploitation of patient/patient's resources: Denies Self-Neglect: Denies Values / Beliefs Cultural Requests During Hospitalization: None Spiritual Requests During Hospitalization: None Consults Spiritual Care Consult Needed: No Social Work Consult Needed: No Regulatory affairs officer (For Healthcare) Does Patient Have a Medical Advance Directive?: No Would patient like information on creating a medical advance directive?: No - Patient declined    Additional Information 1:1 In Past 12 Months?: No CIRT Risk: No Elopement Risk: No Does patient have medical clearance?: Yes     Disposition: Case was staffed with Romilda Garret FNP who recommended a impatent admission to assist with stabilization as appropriate bed placement is investigated.     Disposition Initial Assessment Completed for this Encounter: Yes Disposition of Patient: Admit Type of inpatient treatment program: Adult Patient refused recommended treatment: No Mode of transportation if patient is discharged?: Car  On Site Evaluation by:   Reviewed with Physician:    Mamie Nick 05/04/2018 3:45 PM

## 2018-05-04 NOTE — BH Assessment (Signed)
[place] Assessment Progress Note Per AC, patient has been accepted to [facility]2 at 1930 hours. Patient is sleeping voluntary paperwork needs to be completed prior to transfer.

## 2018-05-04 NOTE — Tx Team (Signed)
Initial Treatment Plan 05/04/2018 11:32 PM LARRISA CRAVEY OVF:[id number]    PATIENT STRESSORS: Financial difficulties Health problems Loss of home   PATIENT STRENGTHS: Ability for insight Average or above average intelligence Capable of independent living Communication skills Supportive family/friends   PATIENT IDENTIFIED PROBLEMS: " I was feeling like I didn't want to live anymore"  " My health has been causing the depression"                   DISCHARGE CRITERIA:  Ability to meet basic life and health needs Adequate post-discharge living arrangements Improved stabilization in mood, thinking, and/or behavior Reduction of life-threatening or endangering symptoms to within safe limits  PRELIMINARY DISCHARGE PLAN: Outpatient therapy Return to previous living arrangement  PATIENT/FAMILY INVOLVEMENT: This treatment plan has been presented to and reviewed with the patient, RHYAN WOLTERS, and/or family member, .  The patient and family have been given the opportunity to ask questions and make suggestions.  Franciso Bend, RN 05/04/2018, 11:32 PM

## 2018-05-04 NOTE — ED Notes (Signed)
Bed: [facility] Expected date:  Expected time:  Means of arrival:  Comments: Hold for room 26

## 2018-05-05 DIAGNOSIS — Z62820 Parent-biological child conflict: Secondary | ICD-10-CM

## 2018-05-05 DIAGNOSIS — Z818 Family history of other mental and behavioral disorders: Secondary | ICD-10-CM

## 2018-05-05 DIAGNOSIS — F4310 Post-traumatic stress disorder, unspecified: Secondary | ICD-10-CM

## 2018-05-05 DIAGNOSIS — F331 Major depressive disorder, recurrent, moderate: Secondary | ICD-10-CM | POA: Diagnosis present

## 2018-05-05 DIAGNOSIS — Z79899 Other long term (current) drug therapy: Secondary | ICD-10-CM

## 2018-05-05 DIAGNOSIS — F332 Major depressive disorder, recurrent severe without psychotic features: Principal | ICD-10-CM

## 2018-05-05 MED ORDER — OXYCODONE HCL 5 MG PO TABS
10.0000 mg | ORAL_TABLET | Freq: Three times a day (TID) | ORAL | Status: DC | PRN
  Administered 2018-05-05 – 2018-05-07 (×3): 10 mg via ORAL
  Filled 2018-05-05 (×3): qty 2

## 2018-05-05 MED ORDER — ALBUTEROL SULFATE HFA 108 (90 BASE) MCG/ACT IN AERS
1.0000 | INHALATION_SPRAY | Freq: Four times a day (QID) | RESPIRATORY_TRACT | Status: DC | PRN
  Administered 2018-05-05: 2 via RESPIRATORY_TRACT
  Filled 2018-05-05: qty 6.7

## 2018-05-05 MED ORDER — PANTOPRAZOLE SODIUM 40 MG PO TBEC
40.0000 mg | DELAYED_RELEASE_TABLET | Freq: Every day | ORAL | Status: DC
  Administered 2018-05-05 – 2018-05-08 (×4): 40 mg via ORAL
  Filled 2018-05-05 (×7): qty 1

## 2018-05-05 MED ORDER — LORATADINE 10 MG PO TABS
10.0000 mg | ORAL_TABLET | Freq: Every day | ORAL | Status: DC
  Administered 2018-05-05 – 2018-05-08 (×4): 10 mg via ORAL
  Filled 2018-05-05 (×7): qty 1

## 2018-05-05 MED ORDER — CLONAZEPAM 0.5 MG PO TABS
0.5000 mg | ORAL_TABLET | Freq: Every day | ORAL | Status: DC
  Administered 2018-05-05 – 2018-05-07 (×3): 0.5 mg via ORAL
  Filled 2018-05-05 (×3): qty 1

## 2018-05-05 MED ORDER — DULOXETINE HCL 60 MG PO CPEP
60.0000 mg | ORAL_CAPSULE | Freq: Two times a day (BID) | ORAL | Status: DC
  Administered 2018-05-05 – 2018-05-08 (×6): 60 mg via ORAL
  Filled 2018-05-05 (×12): qty 1

## 2018-05-05 MED ORDER — LOSARTAN POTASSIUM-HCTZ 50-12.5 MG PO TABS: 1.0000 | ORAL_TABLET | Freq: Every day | ORAL | Status: DC

## 2018-05-05 MED ORDER — ACETAMINOPHEN 325 MG PO TABS
650.0000 mg | ORAL_TABLET | Freq: Four times a day (QID) | ORAL | Status: DC | PRN
  Administered 2018-05-05: 650 mg via ORAL
  Filled 2018-05-05: qty 2

## 2018-05-05 MED ORDER — BECLOMETHASONE DIPROP HFA 80 MCG/ACT IN AERB
1.0000 | INHALATION_SPRAY | Freq: Two times a day (BID) | RESPIRATORY_TRACT | Status: DC
  Administered 2018-05-05 – 2018-05-08 (×6): 1 via RESPIRATORY_TRACT
  Filled 2018-05-05: qty 10.6

## 2018-05-05 MED ORDER — MAGNESIUM HYDROXIDE 400 MG/5ML PO SUSP: 30.0000 mL | Freq: Every day | ORAL | Status: DC | PRN

## 2018-05-05 MED ORDER — HYDROCHLOROTHIAZIDE 12.5 MG PO CAPS
12.5000 mg | ORAL_CAPSULE | Freq: Every day | ORAL | Status: DC
  Administered 2018-05-05 – 2018-05-08 (×4): 12.5 mg via ORAL
  Filled 2018-05-05 (×8): qty 1

## 2018-05-05 MED ORDER — ALUM & MAG HYDROXIDE-SIMETH 200-200-20 MG/5ML PO SUSP: 30.0000 mL | ORAL | Status: DC | PRN

## 2018-05-05 MED ORDER — LOSARTAN POTASSIUM 50 MG PO TABS
50.0000 mg | ORAL_TABLET | Freq: Every day | ORAL | Status: DC
  Administered 2018-05-05 – 2018-05-08 (×4): 50 mg via ORAL
  Filled 2018-05-05 (×7): qty 1

## 2018-05-05 MED ORDER — PREGABALIN 100 MG PO CAPS
200.0000 mg | ORAL_CAPSULE | Freq: Two times a day (BID) | ORAL | Status: DC
  Administered 2018-05-05 – 2018-05-08 (×6): 200 mg via ORAL
  Filled 2018-05-05 (×6): qty 2

## 2018-05-05 MED ORDER — BUPROPION HCL ER (XL) 150 MG PO TB24
150.0000 mg | ORAL_TABLET | Freq: Every day | ORAL | Status: DC
  Administered 2018-05-05 – 2018-05-06 (×2): 150 mg via ORAL
  Filled 2018-05-05 (×4): qty 1

## 2018-05-05 MED ORDER — TRAZODONE HCL 50 MG PO TABS
50.0000 mg | ORAL_TABLET | Freq: Every evening | ORAL | Status: DC | PRN
  Administered 2018-05-05 – 2018-05-07 (×3): 50 mg via ORAL
  Filled 2018-05-05: qty 4
  Filled 2018-05-05 (×2): qty 1

## 2018-05-05 MED ORDER — VITAMIN D3 25 MCG (1000 UNIT) PO TABS
1000.0000 [IU] | ORAL_TABLET | Freq: Every day | ORAL | Status: DC
  Administered 2018-05-05 – 2018-05-08 (×4): 1000 [IU] via ORAL
  Filled 2018-05-05 (×7): qty 1
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
## 2018-05-05 NOTE — BHH Suicide Risk Assessment (Signed)
[facility] Admission Suicide Risk Assessment   Nursing information obtained from:  Patient Demographic factors:  Access to firearms, Unemployed, Low socioeconomic status Current Mental Status:  Self-harm thoughts Loss Factors:  Decline in physical health, Financial problems / change in socioeconomic status Historical Factors:  Family history of mental illness or substance abuse Risk Reduction Factors:  Living with another person, especially a relative, Sense of responsibility to family, Positive therapeutic relationship  Total Time spent with patient: 45 minutes Principal Problem: <principal problem not specified> Diagnosis:   Patient Active Problem List   Diagnosis Date Noted  . MDD (major depressive disorder), recurrent episode, severe ([place]) [F33.2] 05/05/2018  . Vitamin D deficiency [E55.9] 11/05/2017  . Dysphagia [R13.10] 09/02/2017  . OSA (obstructive sleep apnea) [G47.33] 02/14/2017  . Multiple falls [R29.6] 09/04/2016  . Hereditary and idiopathic peripheral neuropathy [G60.9] 04/03/2016  . Exertional shortness of breath [R06.02] 01/26/2016  . Left leg pain [M79.605] 01/26/2016  . Trochanteric bursitis of both hips [M70.61, M70.62] 12/19/2015  . Depression [F32.9] 08/28/2015  . Fibromyalgia [M79.7] 07/28/2015  . Right wrist pain [M25.531] 04/05/2015  . Routine general medical examination at a health care facility [Z00.00] 03/01/2014  . HTN (hypertension) [I10] 01/12/2014  . Morbid obesity ([place]) [E66.01] 01/12/2014  . Pre-diabetes [R73.03] 01/12/2014  . Family history of lupus erythematosus [Z84.0] 01/12/2014  . Fatigue [R53.83] 10/17/2013   Subjective Data: Patient is seen and examined.  Patient is a 62 year old female with a reported past psychiatric history significant for depression and posttraumatic stress disorder who presented to the [facility] emergency department yesterday with suicidal ideation.  The patient stated she had seen her primary care provider yesterday, and had  expressed some suicidal ideation.  The primary care provider referred her to the emergency department.  She was seen in the emergency department the decision was made to admit her to the hospital for depression and suicidal ideation.  She has a multiyear history of depression.  She has been on Cymbalta for fibromyalgia as well as depression.  She last saw a psychiatrist approximately 1 year ago.  She stated that she was suffering from helplessness, hopelessness, worthlessness.  She admitted to fatigue, poor sleep, and suicidal ideation without plan.  She was tearful throughout the interview.  She had no previous psychiatric admissions.  She stated the biggest new stressor was her son being on drugs, and having to kick him out of the house approximately 2 weeks ago.  She was admitted for evaluation and stabilization.  Continued Clinical Symptoms:  Alcohol Use Disorder Identification Test Final Score (AUDIT): 1 The "Alcohol Use Disorders Identification Test", Guidelines for Use in Primary Care, Second Edition.  World Pharmacologist [facility]). Score between 0-7:  no or low risk or alcohol related problems. Score between 8-15:  moderate risk of alcohol related problems. Score between 16-19:  high risk of alcohol related problems. Score 20 or above:  warrants further diagnostic evaluation for alcohol dependence and treatment.   CLINICAL FACTORS:   Depression:   Anhedonia Hopelessness Impulsivity Insomnia   Musculoskeletal: Strength & Muscle Tone: decreased Gait & Station: broad based Patient leans: N/A  Psychiatric Specialty Exam: Physical Exam  Nursing note and vitals reviewed. Constitutional: She is oriented to person, place, and time. She appears well-developed and well-nourished.  HENT:  Head: Normocephalic and atraumatic.  Respiratory: Effort normal.  Neurological: She is alert and oriented to person, place, and time.    ROS  Blood pressure (!) 120/102, pulse 85, temperature 98.7 F  (37.1  C), temperature source Oral, resp. rate 18, height 5' 6.75" (1.695 m), weight 116.1 kg (256 lb).Body mass index is 40.4 kg/m.  General Appearance: Disheveled  Eye Contact:  Poor  Speech:  Slow  Volume:  Decreased  Mood:  Depressed  Affect:  Congruent  Thought Process:  Coherent  Orientation:  Full (Time, Place, and Person)  Thought Content:  Logical  Suicidal Thoughts:  Yes.  without intent/plan  Homicidal Thoughts:  No  Memory:  Immediate;   Fair  Judgement:  Intact  Insight:  Fair  Psychomotor Activity:  Decreased  Concentration:  Concentration: Fair  Recall:  [employer] of Knowledge:  Fair  Language:  Fair  Akathisia:  Negative  Handed:  Right  AIMS (if indicated):     Assets:  Desire for Improvement Housing Resilience  ADL's:  Intact  Cognition:  WNL  Sleep:  Number of Hours: 6.5      COGNITIVE FEATURES THAT CONTRIBUTE TO RISK:  None    SUICIDE RISK:   Mild:  Suicidal ideation of limited frequency, intensity, duration, and specificity.  There are no identifiable plans, no associated intent, mild dysphoria and related symptoms, good self-control (both objective and subjective assessment), few other risk factors, and identifiable protective factors, including available and accessible social support.  PLAN OF CARE: Patient is seen and examined.  Patient is a 62 year old female with the above-stated past psychiatric history who presented to the [facility] emergency department with suicidal ideation.  She is endorsed symptoms of sadness, anhedonia, poor sleep, helplessness and hopelessness.  She was admitted to the hospital for evaluation and stabilization.  She is currently on Cymbalta 60 mg p.o. twice daily.  We will add Wellbutrin XL 150 mg p.o. daily.  She has no history of seizures.  She will be integrated into the milieu.  She will be encouraged to attend groups.  Her diastolic blood pressure appears to be elevated, and we will monitor that and treated  accordingly.  She is currently already on hydrochlorothiazide 12.5 mg p.o. daily as well as losartan 50 mg p.o. daily.  I certify that inpatient services furnished can reasonably be expected to improve the patient's condition.   Sharma Covert, MD 05/05/2018, 8:11 AM

## 2018-05-05 NOTE — Progress Notes (Signed)
Patient denies SI, HI and AVH this shift.  Patient has attended groups and engaged in unit activities.    Assess patient for safety, offer medications as prescribed, engage patient in 1:1 staff talks.   Patient able to contract for safety, continue to monitor as planned.

## 2018-05-05 NOTE — [facility] Group Notes (Signed)
LCSW Group Therapy Note 05/05/2018 2:32 PM  Type of Therapy/Topic: Group Therapy: Feelings about Diagnosis  Participation Level: Did Not Attend   Description of Group:  This group will allow patients to explore their thoughts and feelings about diagnoses they have received. Patients will be guided to explore their level of understanding and acceptance of these diagnoses. Facilitator will encourage patients to process their thoughts and feelings about the reactions of others to their diagnosis and will guide patients in identifying ways to discuss their diagnosis with significant others in their lives. This group will be process-oriented, with patients participating in exploration of their own experiences, giving and receiving support, and processing challenge from other group members.  Therapeutic Goals: 1. Patient will demonstrate understanding of diagnosis as evidenced by identifying two or more symptoms of the disorder 2. Patient will be able to express two feelings regarding the diagnosis 3. Patient will demonstrate their ability to communicate their needs through discussion and/or role play  Summary of Patient Progress:  Invited, chose not to attend.     Therapeutic Modalities:  Cognitive Behavioral Therapy Brief Therapy Feelings Identification    [place] Clinical Social Worker

## 2018-05-05 NOTE — H&P (Signed)
Psychiatric Admission Assessment Adult  Patient Identification: Linda Buckley MRN:  [id number] Date of Evaluation:  05/05/2018 Chief Complaint:  MDD recurrent severe without psychosis  Principal Diagnosis: <principal problem not specified> Diagnosis:   Patient Active Problem List   Diagnosis Date Noted  . MDD (major depressive disorder), recurrent episode, severe ([place]) [F33.2] 05/05/2018  . Vitamin D deficiency [E55.9] 11/05/2017  . Dysphagia [R13.10] 09/02/2017  . OSA (obstructive sleep apnea) [G47.33] 02/14/2017  . Multiple falls [R29.6] 09/04/2016  . Hereditary and idiopathic peripheral neuropathy [G60.9] 04/03/2016  . Exertional shortness of breath [R06.02] 01/26/2016  . Left leg pain [M79.605] 01/26/2016  . Trochanteric bursitis of both hips [M70.61, M70.62] 12/19/2015  . Depression [F32.9] 08/28/2015  . Fibromyalgia [M79.7] 07/28/2015  . Right wrist pain [M25.531] 04/05/2015  . Routine general medical examination at a health care facility [Z00.00] 03/01/2014  . HTN (hypertension) [I10] 01/12/2014  . Morbid obesity ([place]) [E66.01] 01/12/2014  . Pre-diabetes [R73.03] 01/12/2014  . Family history of lupus erythematosus [Z84.0] 01/12/2014  . Fatigue [R53.83] 10/17/2013   History of Present Illness: Patient is seen and examined.  Patient is a 62 year old female with a reported past psychiatric history significant for depression and posttraumatic stress disorder who presented to the [facility] emergency department yesterday with suicidal ideation.  The patient stated she had seen her primary care provider earlier in the day, and expressed some suicidal ideation.  The primary care provider referred her to the emergency department.  She was seen in the emergency department and the decision was made to admit her to the hospital for depression and suicidal ideation.  She has a multiyear history of depression.  She has been on Cymbalta for fibromyalgia as well as depression.  She last saw a  psychiatrist approximately 1 year ago.  She stated she was suffering from helplessness, hopelessness and worthlessness.  She admitted the fatigue, poor sleep and suicidal ideation without plan.  She was tearful throughout the interview.  She had no previous psychiatric admissions.  She stated that the biggest new stressors she had in her life was that she had to kick her son out of the house because of his drug use.  She was admitted to the hospital for evaluation and stabilization. Associated Signs/Symptoms: Depression Symptoms:  depressed mood, anhedonia, insomnia, psychomotor agitation, fatigue, feelings of worthlessness/guilt, difficulty concentrating, hopelessness, suicidal thoughts without plan, anxiety, loss of energy/fatigue, disturbed sleep, (Hypo) Manic Symptoms:  Impulsivity, Anxiety Symptoms:  Excessive Worry, Psychotic Symptoms:  Denied PTSD Symptoms: Had a traumatic exposure:  In the past Total Time spent with patient: 1 hour  Past Psychiatric History: Patient has no previous admissions.  She is been treated with multiple antidepressants in the past.  She is on Cymbalta 60 mg p.o. twice daily currently.  Is the patient at risk to self? Yes.    Has the patient been a risk to self in the past 6 months? No.  Has the patient been a risk to self within the distant past? No.  Is the patient a risk to others? No.  Has the patient been a risk to others in the past 6 months? No.  Has the patient been a risk to others within the distant past? No.   Prior Inpatient Therapy:   Prior Outpatient Therapy:    Alcohol Screening: 1. How often do you have a drink containing alcohol?: Monthly or less 2. How many drinks containing alcohol do you have on a typical day when you are drinking?: 1 or  2 3. How often do you have six or more drinks on one occasion?: Never AUDIT-C Score: 1 4. How often during the last year have you found that you were not able to stop drinking once you had  started?: Never 5. How often during the last year have you failed to do what was normally expected from you becasue of drinking?: Never 6. How often during the last year have you needed a first drink in the morning to get yourself going after a heavy drinking session?: Never 7. How often during the last year have you had a feeling of guilt of remorse after drinking?: Never 8. How often during the last year have you been unable to remember what happened the night before because you had been drinking?: Never 9. Have you or someone else been injured as a result of your drinking?: No 10. Has a relative or friend or a doctor or another health worker been concerned about your drinking or suggested you cut down?: No Alcohol Use Disorder Identification Test Final Score (AUDIT): 1 Intervention/Follow-up: Patient Refused(denies issues with alcohol) Substance Abuse History in the last 12 months:  No. Consequences of Substance Abuse: Negative Previous Psychotropic Medications: Yes  Psychological Evaluations: Yes  Past Medical History:  Past Medical History:  Diagnosis Date  . Allergy   . Anxiety   . Arthritis   . Carpal tunnel syndrome of right wrist    RECURRENT  . Depression   . Dysphagia 09/02/2017  . Edema 09/02/2017  . Environmental allergies    dust mites, pollen, roaches and other insects, mold  . Headache    migraines  . Hereditary and idiopathic peripheral neuropathy 04/03/2016  . History of adenomatous polyp of colon   . History of gastric ulcer   . History of kidney stones   . Hypertension   . Pneumonia   . Sleep apnea    wears CPAP  . Wears glasses     Past Surgical History:  Procedure Laterality Date  . CARPAL TUNNEL RELEASE Right 2007  . CARPAL TUNNEL RELEASE Right 07/05/2015   Procedure: RIGHT HAND REVISION CARPAL TUNNEL RELEASE;  Surgeon: Iran Planas, MD;  Location: [facility];  Service: Orthopedics;  Laterality: Right;  . CARPAL TUNNEL RELEASE Left 09/2016  . CERVICAL DISC  ARTHROPLASTY  03/2015  . COLONOSCOPY W/ POLYPECTOMY  04-15-2014  . CYSTO/  RIGHT RETROGRADE PYELOGRAM/ URETEROSCOPY STONE EXTRACTION/  STENT PLACEMENT  06-07-2010  . LAPAROSCOPIC ASSISTED VAGINAL HYSTERECTOMY  04-25-2004   ovaries remain  . TUBAL LIGATION  1982   Family History:  Family History  Problem Relation Age of Onset  . Asthma Father   . Diabetes Father   . Arthritis Mother   . Hypertension Mother   . Diabetes Mother   . Cancer Mother   . Stomach cancer Mother   . Diabetes Brother   . Diabetes Brother   . Asthma Son   . Cancer Maternal Grandfather        colon  . Colon cancer Maternal Grandfather   . Cancer Maternal Aunt        breast  . Cancer Maternal Uncle        lung  . Colon cancer Maternal Uncle   . Cancer Maternal Grandmother        ovarian  . Diabetes Son    Family Psychiatric  History: Several family members with depression Tobacco Screening: Have you used any form of tobacco in the last 30 days? (Cigarettes, Smokeless Tobacco, Cigars, and/or  Pipes): No Social History:  Social History   Substance and Sexual Activity  Alcohol Use Yes   Comment: 2 glasses of wine weekly     Social History   Substance and Sexual Activity  Drug Use No   Comment: prescription for klonopin and oxy IR    Additional Social History:                           Allergies:   Allergies  Allergen Reactions  . Aspirin Swelling    Sweating/ swelling of hands and face   . Bee Venom Swelling    Swelling at site    Lab Results:  Results for orders placed or performed during the hospital encounter of 05/04/18 (from the past 48 hour(s))  Comprehensive metabolic panel     Status: Abnormal   Collection Time: 05/04/18  2:28 PM  Result Value Ref Range   Sodium 139 135 - 145 mmol/L   Potassium 3.9 3.5 - 5.1 mmol/L   Chloride 105 101 - 111 mmol/L   CO2 24 22 - 32 mmol/L   Glucose, Bld 105 (H) 65 - 99 mg/dL   BUN 14 6 - 20 mg/dL   Creatinine, Ser 0.98 0.44 - 1.00  mg/dL   Calcium 9.5 8.9 - 10.3 mg/dL   Total Protein 7.3 6.5 - 8.1 g/dL   Albumin 3.8 3.5 - 5.0 g/dL   AST 25 15 - 41 U/L   ALT 24 14 - 54 U/L   Alkaline Phosphatase 83 38 - 126 U/L   Total Bilirubin 0.6 0.3 - 1.2 mg/dL   GFR calc non Af Amer >60 >60 mL/min   GFR calc Af Amer >60 >60 mL/min    Comment: (NOTE) The eGFR has been calculated using the CKD EPI equation. This calculation has not been validated in all clinical situations. eGFR's persistently <60 mL/min signify possible Chronic Kidney Disease.    Anion gap 10 5 - 15    Comment: Performed at [facility], [place] [address], [place], [place] [address]  Ethanol     Status: None   Collection Time: 05/04/18  2:28 PM  Result Value Ref Range   Alcohol, Ethyl (B) <10 <10 mg/dL    Comment: (NOTE) Lowest detectable limit for serum alcohol is 10 mg/dL. For medical purposes only. Performed at [facility], [place] [address] [place], [place] [address]   Salicylate level     Status: None   Collection Time: 05/04/18  2:28 PM  Result Value Ref Range   Salicylate Lvl <3.0 2.8 - 30.0 mg/dL    Comment: Performed at [facility], [place] [address], [place], [place] [address]  Acetaminophen level     Status: Abnormal   Collection Time: 05/04/18  2:28 PM  Result Value Ref Range   Acetaminophen (Tylenol), Serum <10 (L) 10 - 30 ug/mL    Comment: (NOTE) Therapeutic concentrations vary significantly. A range of 10-30 ug/mL  may be an effective concentration for many patients. However, some  are best treated at concentrations outside of this range. Acetaminophen concentrations >150 ug/mL at 4 hours after ingestion  and >50 ug/mL at 12 hours after ingestion are often associated with  toxic reactions. Performed at [facility], [place] [address] [place], [place] [address]   cbc     Status: None   Collection Time: 05/04/18  2:28 PM  Result Value Ref Range   WBC 4.5 4.0 -  10.5 K/uL   RBC 4.20 3.87 - 5.11 MIL/uL   Hemoglobin 12.4 12.0 - 15.0 g/dL   HCT 38.9 36.0 - 46.0 %   MCV 92.6 78.0 - 100.0 fL   MCH 29.5 26.0 - 34.0 pg   MCHC 31.9 30.0 - 36.0 g/dL   RDW 13.3 11.5 - 15.5 %   Platelets 234 150 - 400 K/uL    Comment: Performed at [facility], [place] [address] [place], [place] [address]    Blood Alcohol level:  Lab Results  Component Value Date   ETH <10 62/94/7654    Metabolic Disorder Labs:  Lab Results  Component Value Date   HGBA1C 6.0 11/05/2017   No results found for: PROLACTIN Lab Results  Component Value Date   CHOL 169 11/05/2017   TRIG 85.0 11/05/2017   HDL 49.40 11/05/2017   CHOLHDL 3 11/05/2017   VLDL 17.0 11/05/2017   LDLCALC 103 (H) 11/05/2017   LDLCALC 94 10/28/2016    Current Medications: Current Facility-Administered Medications  Medication Dose Route Frequency Provider Last Rate Last Dose  . acetaminophen (TYLENOL) tablet 650 mg  650 mg Oral Q6H PRN Laverle Hobby, PA-C   650 mg at 05/05/18 6503  . albuterol (PROVENTIL HFA;VENTOLIN HFA) 108 (90 Base) MCG/ACT inhaler 1-2 puff  1-2 puff Inhalation Q6H PRN Patriciaann Clan E, PA-C   2 puff at 05/05/18 1157  . alum & mag hydroxide-simeth (MAALOX/MYLANTA) 200-200-20 MG/5ML suspension 30 mL  30 mL Oral Q4H PRN Patriciaann Clan E, PA-C      . beclomethasone (QVAR) 80 MCG/ACT inhaler 1 puff  1 puff Inhalation BID Laverle Hobby, PA-C   1 puff at 05/05/18 0836  . buPROPion (WELLBUTRIN XL) 24 hr tablet 150 mg  150 mg Oral Daily Sharma Covert, MD   150 mg at 05/05/18 0839  . cholecalciferol (VITAMIN D) tablet 1,000 Units  1,000 Units Oral Daily Laverle Hobby, PA-C   1,000 Units at 05/05/18 5465  . clonazePAM (KLONOPIN) tablet 0.5 mg  0.5 mg Oral QHS Simon, Spencer E, PA-C      . DULoxetine (CYMBALTA) DR capsule 60 mg  60 mg Oral BID Laverle Hobby, PA-C   60 mg at 05/05/18 6812  . hydrochlorothiazide (MICROZIDE) capsule 12.5 mg  12.5 mg Oral Daily Cobos,  Myer Peer, MD   12.5 mg at 05/05/18 0839  . loratadine (CLARITIN) tablet 10 mg  10 mg Oral Daily Laverle Hobby, PA-C   10 mg at 05/05/18 0836  . losartan (COZAAR) tablet 50 mg  50 mg Oral Daily Cobos, Myer Peer, MD   50 mg at 05/05/18 0835  . magnesium hydroxide (MILK OF MAGNESIA) suspension 30 mL  30 mL Oral Daily PRN Patriciaann Clan E, PA-C      . oxyCODONE (Oxy IR/ROXICODONE) immediate release tablet 10 mg  10 mg Oral TID PRN Laverle Hobby, PA-C      . pantoprazole (PROTONIX) EC tablet 40 mg  40 mg Oral Daily Patriciaann Clan E, PA-C   40 mg at 05/05/18 0835  . pregabalin (LYRICA) capsule 200 mg  200 mg Oral BID Laverle Hobby, PA-C   200 mg at 05/05/18 7517  . traZODone (DESYREL) tablet 50 mg  50 mg Oral QHS PRN Laverle Hobby, PA-C       PTA Medications: Facility-Administered Medications Prior to Admission  Medication Dose Route Frequency Provider Last Rate Last Dose  . 0.9 %  sodium chloride infusion  500  mL Intravenous Once Milus Banister, MD       Medications Prior to Admission  Medication Sig Dispense Refill Last Dose  . beclomethasone (QVAR REDIHALER) 80 MCG/ACT inhaler Inhale 1 puff into the lungs 2 (two) times daily. 10.6 g 3 05/03/2018 at Unknown time  . buPROPion (WELLBUTRIN XL) 300 MG 24 hr tablet TAKE 1 TABLET(300 MG) BY MOUTH DAILY 30 tablet 3 05/03/2018 at Unknown time  . cetirizine (ZYRTEC) 10 MG tablet Take 1 tablet (10 mg total) by mouth daily. 30 tablet 11 05/03/2018 at Unknown time  . Cholecalciferol (VITAMIN D PO) Take 1 tablet by mouth daily. 1000units   05/03/2018 at Unknown time  . clonazePAM (KLONOPIN) 0.5 MG tablet TAKE 1 TABLET BY MOUTH AT BEDTIME 30 tablet 0 05/03/2018 at Unknown time  . clotrimazole-betamethasone (LOTRISONE) cream Apply 1 application topically 2 (two) times daily. 45 g 1 Past Month at Unknown time  . diclofenac sodium (VOLTAREN) 1 % GEL Apply 4 g topically 4 (four) times daily. 100 g 3 Past Week at Unknown time  . DULoxetine (CYMBALTA) 60  MG capsule TAKE ONE CAPSULE BY MOUTH TWICE DAILY 180 capsule 1 05/03/2018 at Unknown time  . EPINEPHrine (EPIPEN) 0.3 mg/0.3 mL SOAJ injection Inject 0.3 mg into the muscle once.   Not Taking at Unknown time  . furosemide (LASIX) 20 MG tablet TAKE 1 TABLET(20 MG) BY MOUTH DAILY 30 tablet 3 05/03/2018 at Unknown time  . losartan-hydrochlorothiazide (HYZAAR) 50-12.5 MG tablet TAKE 1 TABLET BY MOUTH EVERY DAY 90 tablet 0 05/03/2018 at Unknown time  . omeprazole (PRILOSEC) 40 MG capsule Take 1 capsule (40 mg total) by mouth daily. 30 capsule 3 Past Month at Unknown time  . omeprazole (PRILOSEC) 40 MG capsule   3 Not Taking at Unknown time  . Oxycodone HCl 10 MG TABS TK 1 T PO Q 8 H PRN P  0 05/03/2018 at Unknown time  . pregabalin (LYRICA) 200 MG capsule Take 1 capsule (200 mg total) by mouth 2 (two) times daily. 180 capsule 1 05/03/2018 at Unknown time  . ranitidine (ZANTAC) 300 MG tablet TAKE 1 TABLET(300 MG) BY MOUTH AT BEDTIME (Patient not taking: Reported on 05/04/2018) 30 tablet 6 Not Taking at Unknown time  . traZODone (DESYREL) 50 MG tablet TAKE 1/2 TO 1 TABLET(25 TO 50 MG) BY MOUTH AT BEDTIME AS NEEDED FOR SLEEP 30 tablet 3 Past Month at Unknown time  . VENTOLIN HFA 108 (90 Base) MCG/ACT inhaler INHALE 2 PUFFS BY MOUTH EVERY 4 HOURS AS NEEDED FOR SHORTNESS OF BREATH 36 g 0 05/04/2018 at Unknown time  . vitamin E 1000 UNIT capsule Take by mouth.   Past Week at Unknown time    Musculoskeletal: Strength & Muscle Tone: within normal limits Gait & Station: normal Patient leans: N/A  Psychiatric Specialty Exam: Physical Exam  Constitutional: She is oriented to person, place, and time. She appears well-developed and well-nourished.  HENT:  Head: Normocephalic and atraumatic.  Respiratory: Effort normal.  Neurological: She is alert and oriented to person, place, and time.    ROS  Blood pressure (!) 120/102, pulse 85, temperature 98.7 F (37.1 C), temperature source Oral, resp. rate 18, height 5'  6.75" (1.695 m), weight 116.1 kg (256 lb).Body mass index is 40.4 kg/m.  General Appearance: Casual  Eye Contact:  Fair  Speech:  Normal Rate  Volume:  Normal  Mood:  Depressed  Affect:  Congruent  Thought Process:  Coherent  Orientation:  Full (Time, Place, and  Person)  Thought Content:  Logical  Suicidal Thoughts:  Yes.  without intent/plan  Homicidal Thoughts:  No  Memory:  Immediate;   Fair  Judgement:  Fair  Insight:  Fair  Psychomotor Activity:  Increased  Concentration:  Concentration: Fair  Recall:  Good  Fund of Knowledge:  Fair  Language:  Good  Akathisia:  Negative  Handed:  Right  AIMS (if indicated):     Assets:  Communication Skills Desire for Improvement Housing  ADL's:  Intact  Cognition:  WNL  Sleep:  Number of Hours: 6.5    Treatment Plan Summary: Daily contact with patient to assess and evaluate symptoms and progress in treatment, Medication management and Plan Patient is seen and examined.  Patient is a 62 year old female with the above-stated past psychiatric history was admitted due to suicidal ideation.  She will be integrated into the milieu.  She will be seen by social work.  She will have individual sessions as well as group sessions.  We will concentrate on coping skills.  She has been treated with several medications in the past were not effective.  She is already on Cymbalta 60 mg twice a day which is the maximum.  I am going to add Wellbutrin XL 150 mg p.o. daily.  Hopefully this will augment the Cymbalta in a way that assist in her depression.  Observation Level/Precautions:  15 minute checks  Laboratory:  Chemistry Profile  Psychotherapy:    Medications:    Consultations:    Discharge Concerns:    Estimated LOS:  Other:     Physician Treatment Plan for Primary Diagnosis: <principal problem not specified> Long Term Goal(s): Improvement in symptoms so as ready for discharge  Short Term Goals: Ability to identify changes in lifestyle to  reduce recurrence of condition will improve, Ability to verbalize feelings will improve, Ability to disclose and discuss suicidal ideas, Ability to demonstrate self-control will improve, Ability to identify and develop effective coping behaviors will improve, Ability to maintain clinical measurements within normal limits will improve and Compliance with prescribed medications will improve  Physician Treatment Plan for Secondary Diagnosis: Active Problems:   MDD (major depressive disorder), recurrent episode, severe ([place])  Long Term Goal(s): Improvement in symptoms so as ready for discharge  Short Term Goals: Ability to identify changes in lifestyle to reduce recurrence of condition will improve, Ability to verbalize feelings will improve, Ability to disclose and discuss suicidal ideas, Ability to demonstrate self-control will improve, Ability to identify and develop effective coping behaviors will improve, Ability to maintain clinical measurements within normal limits will improve and Compliance with prescribed medications will improve  I certify that inpatient services furnished can reasonably be expected to improve the patient's condition.    Sharma Covert, MD 5/21/20191:22 PM

## 2018-05-06 MED ORDER — BUPROPION HCL ER (XL) 300 MG PO TB24
300.0000 mg | ORAL_TABLET | Freq: Every day | ORAL | Status: DC
  Administered 2018-05-07 – 2018-05-08 (×2): 300 mg via ORAL
  Filled 2018-05-06 (×5): qty 1

## 2018-05-06 NOTE — Plan of Care (Signed)
Patient self inventory: patient slept fair last night (better because of C-PAP machine). Appetite has been fair, energy level is normal, concentration is good. Depression is rated 6 out of 10, hopelessness 5 out of 10, and anxiety 5 out of 10. Denies SI/HI/AVH. Patient has headache and pain in arms and legs of 6 out of 10. Patient was given medications and claims it has been helpful. Patient's goal today is to "relax. I overdid it yesterday, and read; by slowing down." Patient is compliant with medications being prescribed per MD. Safety maintained with 15 minute checks. Support and encouragement provided.  Problem: Education: Goal: Emotional status will improve Outcome: Progressing  Patient was able to communicate with staff about emotions.

## 2018-05-06 NOTE — Progress Notes (Signed)
[facility] MD Progress Note  05/06/2018 1:08 PM Linda Buckley  MRN:  [id number] Subjective: Patient is seen and examined.  Patient is a 62 year old female with a reported past psychiatric history significant for depression and posttraumatic stress disorder who seen in follow-up.  She states she feels a little bit better today.  Unfortunately she has been "counseling" other patients and we discussed the importance of focusing on herself instead of others.  She understood this.  She denied any side effects to the addition of the Wellbutrin to her medication regimen.  She stated she slept well.  She stated her suicidal ideation has decreased and her mood is slightly better.  Her vital signs are stable, she is afebrile. Principal Problem: <principal problem not specified> Diagnosis:   Patient Active Problem List   Diagnosis Date Noted  . MDD (major depressive disorder), recurrent episode, severe ([place]) [F33.2] 05/05/2018  . Vitamin D deficiency [E55.9] 11/05/2017  . Dysphagia [R13.10] 09/02/2017  . OSA (obstructive sleep apnea) [G47.33] 02/14/2017  . Multiple falls [R29.6] 09/04/2016  . Hereditary and idiopathic peripheral neuropathy [G60.9] 04/03/2016  . Exertional shortness of breath [R06.02] 01/26/2016  . Left leg pain [M79.605] 01/26/2016  . Trochanteric bursitis of both hips [M70.61, M70.62] 12/19/2015  . Depression [F32.9] 08/28/2015  . Fibromyalgia [M79.7] 07/28/2015  . Right wrist pain [M25.531] 04/05/2015  . Routine general medical examination at a health care facility [Z00.00] 03/01/2014  . HTN (hypertension) [I10] 01/12/2014  . Morbid obesity ([place]) [E66.01] 01/12/2014  . Pre-diabetes [R73.03] 01/12/2014  . Family history of lupus erythematosus [Z84.0] 01/12/2014  . Fatigue [R53.83] 10/17/2013   Total Time spent with patient: 20 minutes  Past Psychiatric History: See admission H&P  Past Medical History:  Past Medical History:  Diagnosis Date  . Allergy   . Anxiety   . Arthritis    . Carpal tunnel syndrome of right wrist    RECURRENT  . Depression   . Dysphagia 09/02/2017  . Edema 09/02/2017  . Environmental allergies    dust mites, pollen, roaches and other insects, mold  . Headache    migraines  . Hereditary and idiopathic peripheral neuropathy 04/03/2016  . History of adenomatous polyp of colon   . History of gastric ulcer   . History of kidney stones   . Hypertension   . Pneumonia   . Sleep apnea    wears CPAP  . Wears glasses     Past Surgical History:  Procedure Laterality Date  . CARPAL TUNNEL RELEASE Right 2007  . CARPAL TUNNEL RELEASE Right 07/05/2015   Procedure: RIGHT HAND REVISION CARPAL TUNNEL RELEASE;  Surgeon: Iran Planas, MD;  Location: [facility];  Service: Orthopedics;  Laterality: Right;  . CARPAL TUNNEL RELEASE Left 09/2016  . CERVICAL DISC ARTHROPLASTY  03/2015  . COLONOSCOPY W/ POLYPECTOMY  04-15-2014  . CYSTO/  RIGHT RETROGRADE PYELOGRAM/ URETEROSCOPY STONE EXTRACTION/  STENT PLACEMENT  06-07-2010  . LAPAROSCOPIC ASSISTED VAGINAL HYSTERECTOMY  04-25-2004   ovaries remain  . TUBAL LIGATION  1982   Family History:  Family History  Problem Relation Age of Onset  . Asthma Father   . Diabetes Father   . Arthritis Mother   . Hypertension Mother   . Diabetes Mother   . Cancer Mother   . Stomach cancer Mother   . Diabetes Brother   . Diabetes Brother   . Asthma Son   . Cancer Maternal Grandfather        colon  . Colon cancer Maternal Grandfather   .  Cancer Maternal Aunt        breast  . Cancer Maternal Uncle        lung  . Colon cancer Maternal Uncle   . Cancer Maternal Grandmother        ovarian  . Diabetes Son    Family Psychiatric  History: See admission H&P Social History:  Social History   Substance and Sexual Activity  Alcohol Use Yes   Comment: 2 glasses of wine weekly     Social History   Substance and Sexual Activity  Drug Use No   Comment: prescription for klonopin and oxy IR    Social History    Socioeconomic History  . Marital status: Married    Spouse name: Not on file  . Number of children: 2  . Years of education: 2 yrs college  . Highest education level: Not on file  Occupational History  . 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 Financial resource strain: Not on file  . Food insecurity:    Worry: Not on file    Inability: Not on file  . Transportation needs:    Medical: Not on file    Non-medical: Not on file  Tobacco Use  . Smoking status: Never Smoker  . Smokeless tobacco: Never Used  Substance and Sexual Activity  . Alcohol use: Yes    Comment: 2 glasses of wine weekly  . Drug use: No    Comment: prescription for klonopin and oxy IR  . Sexual activity: Yes    Comment: partial hysterectomy  Lifestyle  . Physical activity:    Days per week: Not on file    Minutes per session: Not on file  . Stress: Not on file  Relationships  . Social connections:    Talks on phone: Not on file    Gets together: Not on file    Attends religious service: Not on file    Active member of club or organization: Not on file    Attends meetings of clubs or organizations: Not on file    Relationship status: Not on file  Other Topics Concern  . Not on file  Social History Narrative   Lives at home w/ her husband   Right-handed   Drinks 2 cups of coffee weekly    3 bottle water per day   Additional Social History:                         Sleep: Fair  Appetite:  Fair  Current Medications: Current Facility-Administered Medications  Medication Dose Route Frequency Provider Last Rate Last Dose  . acetaminophen (TYLENOL) tablet 650 mg  650 mg Oral Q6H PRN Laverle Hobby, PA-C   650 mg at 05/05/18 9702  . albuterol (PROVENTIL HFA;VENTOLIN HFA) 108 (90 Base) MCG/ACT inhaler 1-2 puff  1-2 puff Inhalation Q6H PRN Patriciaann Clan E, PA-C   2 puff at 05/05/18 1157  . alum & mag hydroxide-simeth (MAALOX/MYLANTA) 200-200-20 MG/5ML suspension  30 mL  30 mL Oral Q4H PRN Patriciaann Clan E, PA-C      . beclomethasone (QVAR) 80 MCG/ACT inhaler 1 puff  1 puff Inhalation BID Laverle Hobby, PA-C   1 puff at 05/06/18 0804  . buPROPion (WELLBUTRIN XL) 24 hr tablet 150 mg  150 mg Oral Daily Sharma Covert, MD   150 mg at 05/06/18 0806  . cholecalciferol (VITAMIN D) tablet 1,000 Units  1,000 Units  Oral Daily Laverle Hobby, PA-C   1,000 Units at 05/06/18 0254  . clonazePAM (KLONOPIN) tablet 0.5 mg  0.5 mg Oral QHS Patriciaann Clan E, PA-C   0.5 mg at 05/05/18 2113  . DULoxetine (CYMBALTA) DR capsule 60 mg  60 mg Oral BID Laverle Hobby, PA-C   60 mg at 05/06/18 2706  . hydrochlorothiazide (MICROZIDE) capsule 12.5 mg  12.5 mg Oral Daily Cobos, Myer Peer, MD   12.5 mg at 05/06/18 0805  . loratadine (CLARITIN) tablet 10 mg  10 mg Oral Daily Laverle Hobby, PA-C   10 mg at 05/06/18 2376  . losartan (COZAAR) tablet 50 mg  50 mg Oral Daily Cobos, Myer Peer, MD   50 mg at 05/06/18 0806  . magnesium hydroxide (MILK OF MAGNESIA) suspension 30 mL  30 mL Oral Daily PRN Patriciaann Clan E, PA-C      . oxyCODONE (Oxy IR/ROXICODONE) immediate release tablet 10 mg  10 mg Oral TID PRN Laverle Hobby, PA-C   10 mg at 05/05/18 2113  . pantoprazole (PROTONIX) EC tablet 40 mg  40 mg Oral Daily Laverle Hobby, PA-C   40 mg at 05/06/18 0805  . pregabalin (LYRICA) capsule 200 mg  200 mg Oral BID Laverle Hobby, PA-C   200 mg at 05/06/18 0805  . traZODone (DESYREL) tablet 50 mg  50 mg Oral QHS PRN Laverle Hobby, PA-C   50 mg at 05/05/18 2113    Lab Results:  Results for orders placed or performed during the hospital encounter of 05/04/18 (from the past 48 hour(s))  Comprehensive metabolic panel     Status: Abnormal   Collection Time: 05/04/18  2:28 PM  Result Value Ref Range   Sodium 139 135 - 145 mmol/L   Potassium 3.9 3.5 - 5.1 mmol/L   Chloride 105 101 - 111 mmol/L   CO2 24 22 - 32 mmol/L   Glucose, Bld 105 (H) 65 - 99 mg/dL   BUN 14 6 - 20  mg/dL   Creatinine, Ser 0.98 0.44 - 1.00 mg/dL   Calcium 9.5 8.9 - 10.3 mg/dL   Total Protein 7.3 6.5 - 8.1 g/dL   Albumin 3.8 3.5 - 5.0 g/dL   AST 25 15 - 41 U/L   ALT 24 14 - 54 U/L   Alkaline Phosphatase 83 38 - 126 U/L   Total Bilirubin 0.6 0.3 - 1.2 mg/dL   GFR calc non Af Amer >60 >60 mL/min   GFR calc Af Amer >60 >60 mL/min    Comment: (NOTE) The eGFR has been calculated using the CKD EPI equation. This calculation has not been validated in all clinical situations. eGFR's persistently <60 mL/min signify possible Chronic Kidney Disease.    Anion gap 10 5 - 15    Comment: Performed at [facility], [place] [address] [place], [place] [address]  Ethanol     Status: None   Collection Time: 05/04/18  2:28 PM  Result Value Ref Range   Alcohol, Ethyl (B) <10 <10 mg/dL    Comment: (NOTE) Lowest detectable limit for serum alcohol is 10 mg/dL. For medical purposes only. Performed at [facility], [place] [address] [place],  [address]   Salicylate level     Status: None   Collection Time: 05/04/18  2:28 PM  Result Value Ref Range   Salicylate Lvl <0.7 2.8 - 30.0 mg/dL    Comment: Performed at [facility], [address] [place], [place] [address]  Acetaminophen level     Status: Abnormal   Collection Time: 05/04/18  2:28 PM  Result Value Ref Range   Acetaminophen (Tylenol), Serum <10 (L) 10 - 30 ug/mL    Comment: (NOTE) Therapeutic concentrations vary significantly. A range of 10-30 ug/mL  may be an effective concentration for many patients. However, some  are best treated at concentrations outside of this range. Acetaminophen concentrations >150 ug/mL at 4 hours after ingestion  and >50 ug/mL at 12 hours after ingestion are often associated with  toxic reactions. Performed at [facility], [place] [address], [place], [place] [address]   cbc     Status: None   Collection Time: 05/04/18  2:28 PM   Result Value Ref Range   WBC 4.5 4.0 - 10.5 K/uL   RBC 4.20 3.87 - 5.11 MIL/uL   Hemoglobin 12.4 12.0 - 15.0 g/dL   HCT 38.9 36.0 - 46.0 %   MCV 92.6 78.0 - 100.0 fL   MCH 29.5 26.0 - 34.0 pg   MCHC 31.9 30.0 - 36.0 g/dL   RDW 13.3 11.5 - 15.5 %   Platelets 234 150 - 400 K/uL    Comment: Performed at [facility], [place] [address] [place], [place] [address]    Blood Alcohol level:  Lab Results  Component Value Date   ETH <10 28/20/6015    Metabolic Disorder Labs: Lab Results  Component Value Date   HGBA1C 6.0 11/05/2017   No results found for: PROLACTIN Lab Results  Component Value Date   CHOL 169 11/05/2017   TRIG 85.0 11/05/2017   HDL 49.40 11/05/2017   CHOLHDL 3 11/05/2017   VLDL 17.0 11/05/2017   LDLCALC 103 (H) 11/05/2017   LDLCALC 94 10/28/2016    Physical Findings: AIMS: Facial and Oral Movements Muscles of Facial Expression: None, normal Lips and Perioral Area: None, normal Jaw: None, normal Tongue: None, normal,Extremity Movements Upper (arms, wrists, hands, fingers): None, normal Lower (legs, knees, ankles, toes): None, normal, Trunk Movements Neck, shoulders, hips: None, normal, Overall Severity Severity of abnormal movements (highest score from questions above): None, normal Incapacitation due to abnormal movements: None, normal Patient's awareness of abnormal movements (rate only patient's report): No Awareness, Dental Status Current problems with teeth and/or dentures?: No Does patient usually wear dentures?: No  CIWA:    COWS:     Musculoskeletal: Strength & Muscle Tone: decreased Gait & Station: unsteady Patient leans: N/A  Psychiatric Specialty Exam: Physical Exam  Nursing note and vitals reviewed. Constitutional: She is oriented to person, place, and time. She appears well-developed and well-nourished.  HENT:  Head: Normocephalic and atraumatic.  Respiratory: Effort normal.  Neurological: She is alert and oriented  to person, place, and time.    ROS  Blood pressure 117/75, pulse (!) 111, temperature 98.2 F (36.8 C), temperature source Oral, resp. rate 16, height 5' 6.75" (1.695 m), weight 116.1 kg (256 lb).Body mass index is 40.4 kg/m.  General Appearance: Casual  Eye Contact:  Good  Speech:  Normal Rate  Volume:  Normal  Mood:  Depressed  Affect:  Congruent  Thought Process:  Coherent  Orientation:  Full (Time, Place, and Person)  Thought Content:  Logical  Suicidal Thoughts:  No  Homicidal Thoughts:  No  Memory:  Immediate;   Fair  Judgement:  Intact  Insight:  Fair  Psychomotor Activity:  Decreased  Concentration:  Concentration: Fair  Recall:  [employer] of Knowledge:  Fair  Language:  Fair  Akathisia:  Negative  Handed:  Right  AIMS (if indicated):     Assets:  Communication Skills Desire for Improvement Resilience Social Support  ADL's:  Intact  Cognition:  WNL  Sleep:  Number of Hours: 6.75     Treatment Plan Summary: Daily contact with patient to assess and evaluate symptoms and progress in treatment, Medication management and Plan Patient is seen and examined.  Patient is a 62 year old female with the above-stated past psychiatric history seen in follow-up.  She is slightly better today.  I have asked her to concentrate on some of her own issues instead of being distracted into others.  She denied any side effects from having the Wellbutrin started at 150 mg p.o. daily.  This will be increased to 300 mg p.o. daily.  We will continue her Klonopin, duloxetine and pregabalin as previously prescribed.  Her laboratories were all stable.  Her vital signs are stable.  No other changes to her medications.  Sharma Covert, MD 05/06/2018, 1:08 PM

## 2018-05-06 NOTE — [facility] Group Notes (Signed)
[facility] Group Therapy 05/06/2018 1:15pm  Type of Therapy: Mental Health [facility] Presentation  Participation Level: Active  Participation Quality: Attentive  Affect: Appropriate  Cognitive: Oriented  Insight: Developing/Improving  Engagement in Therapy: Engaged  Modes of Intervention: Discussion, Education and Socialization  Summary of Progress/Problems: [place] ([place]) Speaker came to talk about his personal journey with mental health. The pt processed ways by which to relate to the speaker. [place] speaker provided handouts and educational information pertaining to groups and services offered by the [facility]. Pt was engaged in speaker's presentation and was receptive to resources provided.    [employer], LCSW 05/06/2018 10:43 AM

## 2018-05-06 NOTE — Progress Notes (Signed)
Pt observed ambulating on the unit in a W/C. Pt with a pained affect endorsed moderate anxiety and depression; "my depression and anxiety are both still there." Pt complained about severe chronic back pain. Pt denied SI/HI or AVH. Medications offered as prescribed. All patient's questions and concerns addressed. Support, encouragement, and safe environment provided. Pt was med compliant.

## 2018-05-06 NOTE — Tx Team (Signed)
Interdisciplinary Treatment and Diagnostic Plan Update  05/06/2018 Time of Session: 0933 Linda Buckley MRN: [id number]  Principal Diagnosis: <principal problem not specified>  Secondary Diagnoses: Active Problems:   MDD (major depressive disorder), recurrent episode, severe (HCC)   Current Medications:  Current Facility-Administered Medications  Medication Dose Route Frequency Provider Last Rate Last Dose  . acetaminophen (TYLENOL) tablet 650 mg  650 mg Oral Q6H PRN Laverle Hobby, PA-C   650 mg at 05/05/18 4696  . albuterol (PROVENTIL HFA;VENTOLIN HFA) 108 (90 Base) MCG/ACT inhaler 1-2 puff  1-2 puff Inhalation Q6H PRN Patriciaann Clan E, PA-C   2 puff at 05/05/18 1157  . alum & mag hydroxide-simeth (MAALOX/MYLANTA) 200-200-20 MG/5ML suspension 30 mL  30 mL Oral Q4H PRN Patriciaann Clan E, PA-C      . beclomethasone (QVAR) 80 MCG/ACT inhaler 1 puff  1 puff Inhalation BID Laverle Hobby, PA-C   1 puff at 05/06/18 0804  . buPROPion (WELLBUTRIN XL) 24 hr tablet 150 mg  150 mg Oral Daily Sharma Covert, MD   150 mg at 05/06/18 0806  . cholecalciferol (VITAMIN D) tablet 1,000 Units  1,000 Units Oral Daily Laverle Hobby, PA-C   1,000 Units at 05/06/18 2952  . clonazePAM (KLONOPIN) tablet 0.5 mg  0.5 mg Oral QHS Patriciaann Clan E, PA-C   0.5 mg at 05/05/18 2113  . DULoxetine (CYMBALTA) DR capsule 60 mg  60 mg Oral BID Laverle Hobby, PA-C   60 mg at 05/06/18 8413  . hydrochlorothiazide (MICROZIDE) capsule 12.5 mg  12.5 mg Oral Daily Cobos, Myer Peer, MD   12.5 mg at 05/06/18 0805  . loratadine (CLARITIN) tablet 10 mg  10 mg Oral Daily Laverle Hobby, PA-C   10 mg at 05/06/18 2440  . losartan (COZAAR) tablet 50 mg  50 mg Oral Daily Cobos, Myer Peer, MD   50 mg at 05/06/18 0806  . magnesium hydroxide (MILK OF MAGNESIA) suspension 30 mL  30 mL Oral Daily PRN Patriciaann Clan E, PA-C      . oxyCODONE (Oxy IR/ROXICODONE) immediate release tablet 10 mg  10 mg Oral TID PRN Laverle Hobby,  PA-C   10 mg at 05/05/18 2113  . pantoprazole (PROTONIX) EC tablet 40 mg  40 mg Oral Daily Laverle Hobby, PA-C   40 mg at 05/06/18 0805  . pregabalin (LYRICA) capsule 200 mg  200 mg Oral BID Laverle Hobby, PA-C   200 mg at 05/06/18 0805  . traZODone (DESYREL) tablet 50 mg  50 mg Oral QHS PRN Laverle Hobby, PA-C   50 mg at 05/05/18 2113   PTA Medications: Facility-Administered Medications Prior to Admission  Medication Dose Route Frequency Provider Last Rate Last Dose  . 0.9 %  sodium chloride infusion  500 mL Intravenous Once Milus Banister, MD       Medications Prior to Admission  Medication Sig Dispense Refill Last Dose  . beclomethasone (QVAR REDIHALER) 80 MCG/ACT inhaler Inhale 1 puff into the lungs 2 (two) times daily. 10.6 g 3 05/03/2018 at Unknown time  . buPROPion (WELLBUTRIN XL) 300 MG 24 hr tablet TAKE 1 TABLET(300 MG) BY MOUTH DAILY 30 tablet 3 05/03/2018 at Unknown time  . cetirizine (ZYRTEC) 10 MG tablet Take 1 tablet (10 mg total) by mouth daily. 30 tablet 11 05/03/2018 at Unknown time  . Cholecalciferol (VITAMIN D PO) Take 1 tablet by mouth daily. 1000units   05/03/2018 at Unknown time  . clonazePAM (KLONOPIN) 0.5  MG tablet TAKE 1 TABLET BY MOUTH AT BEDTIME 30 tablet 0 05/03/2018 at Unknown time  . clotrimazole-betamethasone (LOTRISONE) cream Apply 1 application topically 2 (two) times daily. 45 g 1 Past Month at Unknown time  . diclofenac sodium (VOLTAREN) 1 % GEL Apply 4 g topically 4 (four) times daily. 100 g 3 Past Week at Unknown time  . DULoxetine (CYMBALTA) 60 MG capsule TAKE ONE CAPSULE BY MOUTH TWICE DAILY 180 capsule 1 05/03/2018 at Unknown time  . EPINEPHrine (EPIPEN) 0.3 mg/0.3 mL SOAJ injection Inject 0.3 mg into the muscle once.   Not Taking at Unknown time  . furosemide (LASIX) 20 MG tablet TAKE 1 TABLET(20 MG) BY MOUTH DAILY 30 tablet 3 05/03/2018 at Unknown time  . losartan-hydrochlorothiazide (HYZAAR) 50-12.5 MG tablet TAKE 1 TABLET BY MOUTH EVERY DAY 90  tablet 0 05/03/2018 at Unknown time  . omeprazole (PRILOSEC) 40 MG capsule Take 1 capsule (40 mg total) by mouth daily. 30 capsule 3 Past Month at Unknown time  . omeprazole (PRILOSEC) 40 MG capsule   3 Not Taking at Unknown time  . Oxycodone HCl 10 MG TABS TK 1 T PO Q 8 H PRN P  0 05/03/2018 at Unknown time  . pregabalin (LYRICA) 200 MG capsule Take 1 capsule (200 mg total) by mouth 2 (two) times daily. 180 capsule 1 05/03/2018 at Unknown time  . ranitidine (ZANTAC) 300 MG tablet TAKE 1 TABLET(300 MG) BY MOUTH AT BEDTIME (Patient not taking: Reported on 05/04/2018) 30 tablet 6 Not Taking at Unknown time  . traZODone (DESYREL) 50 MG tablet TAKE 1/2 TO 1 TABLET(25 TO 50 MG) BY MOUTH AT BEDTIME AS NEEDED FOR SLEEP 30 tablet 3 Past Month at Unknown time  . VENTOLIN HFA 108 (90 Base) MCG/ACT inhaler INHALE 2 PUFFS BY MOUTH EVERY 4 HOURS AS NEEDED FOR SHORTNESS OF BREATH 36 g 0 05/04/2018 at Unknown time  . vitamin E 1000 UNIT capsule Take by mouth.   Past Week at Unknown time    Patient Stressors: Financial difficulties Health problems Loss of home  Patient Strengths: Ability for insight Average or above average intelligence Capable of independent living Communication skills Supportive family/friends  Treatment Modalities: Medication Management, Group therapy, Case management,  1 to 1 session with clinician, Psychoeducation, Recreational therapy.   Physician Treatment Plan for Primary Diagnosis: <principal problem not specified> Long Term Goal(s): Improvement in symptoms so as ready for discharge Improvement in symptoms so as ready for discharge   Short Term Goals: Ability to identify changes in lifestyle to reduce recurrence of condition will improve Ability to verbalize feelings will improve Ability to disclose and discuss suicidal ideas Ability to demonstrate self-control will improve Ability to identify and develop effective coping behaviors will improve Ability to maintain clinical  measurements within normal limits will improve Compliance with prescribed medications will improve Ability to identify changes in lifestyle to reduce recurrence of condition will improve Ability to verbalize feelings will improve Ability to disclose and discuss suicidal ideas Ability to demonstrate self-control will improve Ability to identify and develop effective coping behaviors will improve Ability to maintain clinical measurements within normal limits will improve Compliance with prescribed medications will improve  Medication Management: Evaluate patient's response, side effects, and tolerance of medication regimen.  Therapeutic Interventions: 1 to 1 sessions, Unit Group sessions and Medication administration.  Evaluation of Outcomes: Progressing  Physician Treatment Plan for Secondary Diagnosis: Active Problems:   MDD (major depressive disorder), recurrent episode, severe ([place])  Long Term Goal(s): Improvement  in symptoms so as ready for discharge Improvement in symptoms so as ready for discharge   Short Term Goals: Ability to identify changes in lifestyle to reduce recurrence of condition will improve Ability to verbalize feelings will improve Ability to disclose and discuss suicidal ideas Ability to demonstrate self-control will improve Ability to identify and develop effective coping behaviors will improve Ability to maintain clinical measurements within normal limits will improve Compliance with prescribed medications will improve Ability to identify changes in lifestyle to reduce recurrence of condition will improve Ability to verbalize feelings will improve Ability to disclose and discuss suicidal ideas Ability to demonstrate self-control will improve Ability to identify and develop effective coping behaviors will improve Ability to maintain clinical measurements within normal limits will improve Compliance with prescribed medications will improve     Medication  Management: Evaluate patient's response, side effects, and tolerance of medication regimen.  Therapeutic Interventions: 1 to 1 sessions, Unit Group sessions and Medication administration.  Evaluation of Outcomes: Progressing   RN Treatment Plan for Primary Diagnosis: <principal problem not specified> Long Term Goal(s): Knowledge of disease and therapeutic regimen to maintain health will improve  Short Term Goals: Ability to identify and develop effective coping behaviors will improve and Compliance with prescribed medications will improve  Medication Management: RN will administer medications as ordered by provider, will assess and evaluate patient's response and provide education to patient for prescribed medication. RN will report any adverse and/or side effects to prescribing provider.  Therapeutic Interventions: 1 on 1 counseling sessions, Psychoeducation, Medication administration, Evaluate responses to treatment, Monitor vital signs and CBGs as ordered, Perform/monitor CIWA, COWS, AIMS and Fall Risk screenings as ordered, Perform wound care treatments as ordered.  Evaluation of Outcomes: Progressing   LCSW Treatment Plan for Primary Diagnosis: <principal problem not specified> Long Term Goal(s): Safe transition to appropriate next level of care at discharge, Engage patient in therapeutic group addressing interpersonal concerns.  Short Term Goals: Engage patient in aftercare planning with referrals and resources, Increase social support and Increase skills for wellness and recovery  Therapeutic Interventions: Assess for all discharge needs, 1 to 1 time with Social worker, Explore available resources and support systems, Assess for adequacy in community support network, Educate family and significant other(s) on suicide prevention, Complete Psychosocial Assessment, Interpersonal group therapy.  Evaluation of Outcomes: Progressing   Progress in Treatment: Attending groups:  No. Participating in groups: No. Taking medication as prescribed: Yes. Toleration medication: Yes. Family/Significant other contact made: No, will contact:  sister Patient understands diagnosis: Yes. Discussing patient identified problems/goals with staff: Yes. Medical problems stabilized or resolved: Yes. Denies suicidal/homicidal ideation: Yes. Issues/concerns per patient self-inventory: No. Other: none  New problem(s) identified: No, Describe:  none  New Short Term/Long Term Goal(s):Pt goal: "to learn how to calm myself."  Discharge Plan or Barriers:   Reason for Continuation of Hospitalization: Depression Medication stabilization  Estimated Length of Stay: 2-4 days.  Attendees: Patient: Linda Buckley 05/06/2018   Physician: Dr Mallie Darting, MD 05/06/2018   Nursing: Megan Mans, RN 05/06/2018   RN Care Manager: 05/06/2018   Social Worker: Lurline Idol, LCSW 05/06/2018   Recreational Therapist:  05/06/2018   Other:  05/06/2018   Other:  05/06/2018  Other: 05/06/2018        Scribe for Treatment Team: Joanne Chars, [place] 05/06/2018 11:09 AM

## 2018-05-06 NOTE — [facility] Suicide Risk Assessment (Signed)
[name] INPATIENT:  Family/Significant Other Suicide Prevention Education  Suicide Prevention Education:  Education Completed; Linda Buckley, sister, [phone], has been identified by the patient as the family member/significant other with whom the patient will be residing, and identified as the person(s) who will aid the patient in the event of a mental health crisis (suicidal ideations/suicide attempt).  With written consent from the patient, the family member/significant other has been provided the following suicide prevention education, prior to the and/or following the discharge of the patient.  The suicide prevention education provided includes the following:  Suicide risk factors  Suicide prevention and interventions  National Suicide Hotline telephone number  [facility] assessment telephone number  [facility] Emergency Assistance [place] and/or Residential Mobile Crisis Unit telephone number  Request made of family/significant other to:  Remove weapons (e.g., guns, rifles, knives), all items previously/currently identified as safety concern.  Sister not aware of any guns.  Remove drugs/medications (over-the-counter, prescriptions, illicit drugs), all items previously/currently identified as a safety concern.  The family member/significant other verbalizes understanding of the suicide prevention education information provided.  The family member/significant other agrees to remove the items of safety concern listed above.  Sister reports that there are a lot of current stressors: pain/fibromyalgia, unable to work, financial stress, child is struggling with drugs.  Sister does check in with her regularly and will continue to do so.  Some concerns that pt was not given her lyrica by RN last night.  Plan is for pt to stay with her mother for a few days after discharge so that she is not alone during the day while her husband works.  Pt has good family  support.    Joanne Chars, LCSW 05/06/2018, 12:56 PM

## 2018-05-06 NOTE — Progress Notes (Signed)
Adult Psychoeducational Group Note  Date:  05/06/2018 Time:  9:26 PM  Group Topic/Focus:  Wrap-Up Group:   The focus of this group is to help patients review their daily goal of treatment and discuss progress on daily workbooks.  Participation Level:  Active  Participation Quality:  Appropriate  Affect:  Appropriate  Cognitive:  Alert and Oriented  Insight: Improving  Engagement in Group:  Developing/Improving  Modes of Intervention:  Exploration and Support  Additional Comments:  Pt verbalized that she had a lot of good classes. Pt verbalized something positive is that her family came to visit and that she was able to go outside. Pt rated her day a 9. Pt verbalized that she will utilize meditation.  Linda Buckley, [name] 05/06/2018, 9:26 PM

## 2018-05-06 NOTE — [facility] Group Notes (Signed)
Adult Psychoeducational Group Note  Date:  05/06/2018 Time:  4:00 PM  Group Topic/Focus: Personal Development Self Care:   The focus of this group is to help patients understand the importance of self-care in order to improve or restore emotional, physical, spiritual, interpersonal, and financial health.  Participation Level:  Did Not Attend  Participation Quality:    Affect:    Cognitive:    Insight:   Engagement in Group:    Modes of Intervention:    Additional Comments:  Patient was invited but did not attend group.  [place] 05/06/2018, 5:34 PM

## 2018-05-07 NOTE — Plan of Care (Signed)
  Problem: Education: Goal: Knowledge of [facility] Education information/materials will improve Outcome: Progressing Goal: Emotional status will improve Outcome: Progressing Goal: Mental status will improve Outcome: Progressing   Problem: Safety: Goal: Periods of time without injury will increase Outcome: Progressing   Problem: Activity: Goal: Interest or engagement in leisure activities will improve Outcome: Progressing   Problem: Health Behavior/Discharge Planning: Goal: Ability to make decisions will improve Outcome: Progressing

## 2018-05-07 NOTE — Progress Notes (Signed)
Patient states that she initially had a rough start to her day due to her fibromyalgia but is now feeling better. She states that on a more positive note that she worked on her discharge plans with her physician and Education officer, [employer]. Her goal for tomorrow is to get discharged and to begin working on me.

## 2018-05-07 NOTE — Therapy (Signed)
Adult Psychoeducational Group Note  Date:  05/07/2018 Time:  8:59 AM  Group Topic/Focus:  Stress Management  Participation Level:  Active  Participation Quality:  Appropriate  Affect:  Appropriate  Cognitive:  Appropriate  Insight: Improving  Engagement in Group:  Engaged  Modes of Intervention:  Education  Additional Comments:    S: "I sometimes use deep breathing" O: Stress management group completed to use as productive coping strategy, to help mitigate maladaptive coping to integrate in functional BADL/IADL. Education given on the definition of stress and its cognitive, behavioral, emotional, and physical effects on the body. Stress symptom checklist completed. Stress management tool worksheet discussed to educate on unhealthy vs healthy coping skills to manage stress to improve community integration. Self control circle activity completed to identify areas of control and areas not within personal control. Education given on use of progressive muscle relaxation. PMR script delivered with relaxing music to facilitate relaxation response to help increase ability to engage in BADL. A: Pt presents engaged in group, flat initially but brighter and smiling and laughing/making jokes with other group members. Pt completed stress checklist showing high signs of stress. Pt engaged in stress management worksheet with improving insight of coping skills. Pt identified she cannot control how others peak to her but can control her reactions. Pt thoroughly enjoyed PMR and wishes to continue this practice not only to elicit relaxation, but 2/2 to her physical pains at [place] as well. P: OT will continue to follow up with stress management skills for successful implementation into daily life.   Zenovia Jarred, MSOT, OTR/L  [place] 05/07/2018, 8:59 AM

## 2018-05-07 NOTE — Progress Notes (Signed)
[facility] MD Progress Note  05/07/2018 11:12 AM AYDIN CAVALIERI  MRN:  [id number] Subjective: Patient is seen and examined.  Patient's 62 year old female with a reported past psychiatric history significant for depression and posttraumatic stress disorder seen in follow-up.  She continues to slowly improve.  She had a good visit with her family last night, and she feels like there is supportive.  We discussed coping skills training, and focusing on herself instead of "counseling others".  Her Wellbutrin was increased yesterday to 300 mg p.o. daily, and she is tolerating that well.  She denied any side effects.  She stated she was sleeping well.  She denied any suicidal ideation. Principal Problem: <principal problem not specified> Diagnosis:   Patient Active Problem List   Diagnosis Date Noted  . MDD (major depressive disorder), recurrent episode, severe ([place]) [F33.2] 05/05/2018  . Vitamin D deficiency [E55.9] 11/05/2017  . Dysphagia [R13.10] 09/02/2017  . OSA (obstructive sleep apnea) [G47.33] 02/14/2017  . Multiple falls [R29.6] 09/04/2016  . Hereditary and idiopathic peripheral neuropathy [G60.9] 04/03/2016  . Exertional shortness of breath [R06.02] 01/26/2016  . Left leg pain [M79.605] 01/26/2016  . Trochanteric bursitis of both hips [M70.61, M70.62] 12/19/2015  . Depression [F32.9] 08/28/2015  . Fibromyalgia [M79.7] 07/28/2015  . Right wrist pain [M25.531] 04/05/2015  . Routine general medical examination at a health care facility [Z00.00] 03/01/2014  . HTN (hypertension) [I10] 01/12/2014  . Morbid obesity ([place]) [E66.01] 01/12/2014  . Pre-diabetes [R73.03] 01/12/2014  . Family history of lupus erythematosus [Z84.0] 01/12/2014  . Fatigue [R53.83] 10/17/2013   Total Time spent with patient: 20 minutes  Past Psychiatric History: See admission H&P  Past Medical History:  Past Medical History:  Diagnosis Date  . Allergy   . Anxiety   . Arthritis   . Carpal tunnel syndrome of right  wrist    RECURRENT  . Depression   . Dysphagia 09/02/2017  . Edema 09/02/2017  . Environmental allergies    dust mites, pollen, roaches and other insects, mold  . Headache    migraines  . Hereditary and idiopathic peripheral neuropathy 04/03/2016  . History of adenomatous polyp of colon   . History of gastric ulcer   . History of kidney stones   . Hypertension   . Pneumonia   . Sleep apnea    wears CPAP  . Wears glasses     Past Surgical History:  Procedure Laterality Date  . CARPAL TUNNEL RELEASE Right 2007  . CARPAL TUNNEL RELEASE Right 07/05/2015   Procedure: RIGHT HAND REVISION CARPAL TUNNEL RELEASE;  Surgeon: Iran Planas, MD;  Location: [facility];  Service: Orthopedics;  Laterality: Right;  . CARPAL TUNNEL RELEASE Left 09/2016  . CERVICAL DISC ARTHROPLASTY  03/2015  . COLONOSCOPY W/ POLYPECTOMY  04-15-2014  . CYSTO/  RIGHT RETROGRADE PYELOGRAM/ URETEROSCOPY STONE EXTRACTION/  STENT PLACEMENT  06-07-2010  . LAPAROSCOPIC ASSISTED VAGINAL HYSTERECTOMY  04-25-2004   ovaries remain  . TUBAL LIGATION  1982   Family History:  Family History  Problem Relation Age of Onset  . Asthma Father   . Diabetes Father   . Arthritis Mother   . Hypertension Mother   . Diabetes Mother   . Cancer Mother   . Stomach cancer Mother   . Diabetes Brother   . Diabetes Brother   . Asthma Son   . Cancer Maternal Grandfather        colon  . Colon cancer Maternal Grandfather   . Cancer Maternal Aunt  breast  . Cancer Maternal Uncle        lung  . Colon cancer Maternal Uncle   . Cancer Maternal Grandmother        ovarian  . Diabetes Son    Family Psychiatric  History: See admission H&P Social History:  Social History   Substance and Sexual Activity  Alcohol Use Yes   Comment: 2 glasses of wine weekly     Social History   Substance and Sexual Activity  Drug Use No   Comment: prescription for klonopin and oxy IR    Social History   Socioeconomic History  . Marital status:  Married    Spouse name: Not on file  . Number of children: 2  . Years of education: 2 yrs college  . Highest education level: Not on file  Occupational History  . 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 Financial resource strain: Not on file  . Food insecurity:    Worry: Not on file    Inability: Not on file  . Transportation needs:    Medical: Not on file    Non-medical: Not on file  Tobacco Use  . Smoking status: Never Smoker  . Smokeless tobacco: Never Used  Substance and Sexual Activity  . Alcohol use: Yes    Comment: 2 glasses of wine weekly  . Drug use: No    Comment: prescription for klonopin and oxy IR  . Sexual activity: Yes    Comment: partial hysterectomy  Lifestyle  . Physical activity:    Days per week: Not on file    Minutes per session: Not on file  . Stress: Not on file  Relationships  . Social connections:    Talks on phone: Not on file    Gets together: Not on file    Attends religious service: Not on file    Active member of club or organization: Not on file    Attends meetings of clubs or organizations: Not on file    Relationship status: Not on file  Other Topics Concern  . Not on file  Social History Narrative   Lives at home w/ her husband   Right-handed   Drinks 2 cups of coffee weekly    3 bottle water per day   Additional Social History:                         Sleep: Good  Appetite:  Fair  Current Medications: Current Facility-Administered Medications  Medication Dose Route Frequency Provider Last Rate Last Dose  . acetaminophen (TYLENOL) tablet 650 mg  650 mg Oral Q6H PRN Laverle Hobby, PA-C   650 mg at 05/05/18 5188  . albuterol (PROVENTIL HFA;VENTOLIN HFA) 108 (90 Base) MCG/ACT inhaler 1-2 puff  1-2 puff Inhalation Q6H PRN Patriciaann Clan E, PA-C   2 puff at 05/05/18 1157  . alum & mag hydroxide-simeth (MAALOX/MYLANTA) 200-200-20 MG/5ML suspension 30 mL  30 mL Oral Q4H PRN Patriciaann Clan E,  PA-C      . beclomethasone (QVAR) 80 MCG/ACT inhaler 1 puff  1 puff Inhalation BID Laverle Hobby, PA-C   1 puff at 05/07/18 0843  . buPROPion (WELLBUTRIN XL) 24 hr tablet 300 mg  300 mg Oral Daily Sharma Covert, MD   300 mg at 05/07/18 [id number]  . cholecalciferol (VITAMIN D) tablet 1,000 Units  1,000 Units Oral Daily Patriciaann Clan E, PA-C   1,000 Units  at 05/07/18 8315  . clonazePAM (KLONOPIN) tablet 0.5 mg  0.5 mg Oral QHS Patriciaann Clan E, PA-C   0.5 mg at 05/06/18 2114  . DULoxetine (CYMBALTA) DR capsule 60 mg  60 mg Oral BID Laverle Hobby, PA-C   60 mg at 05/07/18 1761  . hydrochlorothiazide (MICROZIDE) capsule 12.5 mg  12.5 mg Oral Daily Cobos, Myer Peer, MD   12.5 mg at 05/07/18 6073  . loratadine (CLARITIN) tablet 10 mg  10 mg Oral Daily Laverle Hobby, PA-C   10 mg at 05/07/18 7106  . losartan (COZAAR) tablet 50 mg  50 mg Oral Daily Cobos, Myer Peer, MD   50 mg at 05/07/18 2694  . magnesium hydroxide (MILK OF MAGNESIA) suspension 30 mL  30 mL Oral Daily PRN Patriciaann Clan E, PA-C      . oxyCODONE (Oxy IR/ROXICODONE) immediate release tablet 10 mg  10 mg Oral TID PRN Patriciaann Clan E, PA-C   10 mg at 05/07/18 0837  . pantoprazole (PROTONIX) EC tablet 40 mg  40 mg Oral Daily Laverle Hobby, PA-C   40 mg at 05/07/18 8546  . pregabalin (LYRICA) capsule 200 mg  200 mg Oral BID Laverle Hobby, PA-C   200 mg at 05/07/18 2703  . traZODone (DESYREL) tablet 50 mg  50 mg Oral QHS PRN Laverle Hobby, PA-C   50 mg at 05/06/18 2114    Lab Results: No results found for this or any previous visit (from the past 48 hour(s)).  Blood Alcohol level:  Lab Results  Component Value Date   ETH <10 50/08/3817    Metabolic Disorder Labs: Lab Results  Component Value Date   HGBA1C 6.0 11/05/2017   No results found for: PROLACTIN Lab Results  Component Value Date   CHOL 169 11/05/2017   TRIG 85.0 11/05/2017   HDL 49.40 11/05/2017   CHOLHDL 3 11/05/2017   VLDL 17.0 11/05/2017    LDLCALC 103 (H) 11/05/2017   LDLCALC 94 10/28/2016    Physical Findings: AIMS: Facial and Oral Movements Muscles of Facial Expression: None, normal Lips and Perioral Area: None, normal Jaw: None, normal Tongue: None, normal,Extremity Movements Upper (arms, wrists, hands, fingers): None, normal Lower (legs, knees, ankles, toes): None, normal, Trunk Movements Neck, shoulders, hips: None, normal, Overall Severity Severity of abnormal movements (highest score from questions above): None, normal Incapacitation due to abnormal movements: None, normal Patient's awareness of abnormal movements (rate only patient's report): No Awareness, Dental Status Current problems with teeth and/or dentures?: No Does patient usually wear dentures?: No  CIWA:    COWS:     Musculoskeletal: Strength & Muscle Tone: decreased Gait & Station: unsteady Patient leans: N/A  Psychiatric Specialty Exam: Physical Exam  Constitutional: She is oriented to person, place, and time. She appears well-developed and well-nourished.  HENT:  Head: Normocephalic and atraumatic.  Neurological: She is alert and oriented to person, place, and time.    ROS  Blood pressure 114/74, pulse 84, temperature 98.2 F (36.8 C), temperature source Oral, resp. rate 16, height 5' 6.75" (1.695 m), weight 116.1 kg (256 lb).Body mass index is 40.4 kg/m.  General Appearance: Casual  Eye Contact:  Good  Speech:  Clear and Coherent  Volume:  Normal  Mood:  Euthymic  Affect:  Congruent  Thought Process:  Coherent  Orientation:  Full (Time, Place, and Person)  Thought Content:  Logical  Suicidal Thoughts:  No  Homicidal Thoughts:  No  Memory:  Immediate;   Fair  Judgement:  Intact  Insight:  Fair  Psychomotor Activity:  Normal  Concentration:  Concentration: Fair  Recall:  [place] of Knowledge:  Good  Language:  Good  Akathisia:  Negative  Handed:  Right  AIMS (if indicated):     Assets:  Communication Skills Desire for  Improvement Financial Resources/Insurance Housing Intimacy Social Support  ADL's:  Intact  Cognition:  WNL  Sleep:  Number of Hours: 6.5     Treatment Plan Summary: Daily contact with patient to assess and evaluate symptoms and progress in treatment, Medication management and Plan Patient is seen and examined.  Patient is a 62 year old female with the above-stated past psychiatric history was seen in follow-up.  She continues to slowly improve.  She is tolerating the Wellbutrin with the Cymbalta.  I am not going to change either of their dosages today.  If she continues to improve we will plan on discharge in 1 to 2 days.  Sharma Covert, MD 05/07/2018, 11:12 AM

## 2018-05-07 NOTE — BHH Group Notes (Signed)
[place] Group Notes:  Nursing Education  Date:  05/07/2018  Time:  4:00 PM  Type of Therapy:  Nurse Education  Participation Level:  Active  Participation Quality:  Appropriate, Attentive, Sharing and Supportive  Affect:  Appropriate  Cognitive:  Alert and Oriented  Insight:  Appropriate and Improving  Engagement in Group:  Developing/Improving  Modes of Intervention:  Discussion and Education  Summary of Progress/Problems: Patient contributed positively to group, was supportive of peers and appropriate. Patient reports she is learning to accept the things she cannot change, such as not working anymore, and is trying to think more positively about life.  Linda Buckley 05/07/2018, 6:35 PM

## 2018-05-07 NOTE — Progress Notes (Signed)
D: Patient reports she slept "on and off" last night.  She presents with brighter affect today.  She denies any thoughts of self harm.  Her main concern was her pain this morning.  She also states that her husband needs to bring a "stimulator" in that uses for her pain issues.  Informed patient that if MD approves it and writes and order, she will be able to use it.  She rates her depression as a 5 today; she denies any hopelessness or anxiety.  Her goal today is to "work on positive things, like reading, planning for my relief."  She continues to report chronic pain from her fibromyalgia.  She is ambulating with a walker.    A: Continue to monitor medication management and MD orders.  Safety checks completed every 15 minutes per protocol.  Offer support and encouragement as needed.  R: Patient is receptive to staff; her behavior is appropriate.

## 2018-05-07 NOTE — BHH Group Notes (Signed)
Date: 05/07/18, 1315  Type: group therapy  Participate Level: active  Description: CSW conducted a check in group with the five participants who attended.  Discussion topics included managing conflict with family, boundaries, and seeking support for yourself.  Summary of progress: good participation.  Pt shared about previous work stress and looking for support from others when she had often been the support person in the past.

## 2018-05-08 MED ORDER — PANTOPRAZOLE SODIUM 40 MG PO TBEC: 40.0000 mg | DELAYED_RELEASE_TABLET | Freq: Every day | ORAL | 0 refills | Status: DC

## 2018-05-08 MED ORDER — BUPROPION HCL ER (XL) 300 MG PO TB24: 300.0000 mg | ORAL_TABLET | Freq: Every day | ORAL | 0 refills | Status: DC

## 2018-05-08 MED ORDER — DULOXETINE HCL 60 MG PO CPEP: 60.0000 mg | ORAL_CAPSULE | Freq: Two times a day (BID) | ORAL | 0 refills | Status: DC

## 2018-05-08 MED ORDER — TRAZODONE HCL 50 MG PO TABS: 50.0000 mg | ORAL_TABLET | Freq: Every evening | ORAL | 0 refills | Status: DC | PRN

## 2018-05-08 MED FILL — traZODone HCL 50 MG TABS: 50 | 30 days supply | Qty: 30 | Fill #0

## 2018-05-08 MED FILL — PANTOPRAZOLE SOD DR 40 MG T: 40 | 30 days supply | Qty: 30 | Fill #0

## 2018-05-08 MED FILL — DULoxetine HCL 60 MG CPEP: 60 | 30 days supply | Qty: 60 | Fill #0

## 2018-05-08 NOTE — BHH Suicide Risk Assessment (Signed)
[facility] Discharge Suicide Risk Assessment   Principal Problem: <principal problem not specified> Discharge Diagnoses:  Patient Active Problem List   Diagnosis Date Noted  . MDD (major depressive disorder), recurrent episode, severe ([place]) [F33.2] 05/05/2018  . Vitamin D deficiency [E55.9] 11/05/2017  . Dysphagia [R13.10] 09/02/2017  . OSA (obstructive sleep apnea) [G47.33] 02/14/2017  . Multiple falls [R29.6] 09/04/2016  . Hereditary and idiopathic peripheral neuropathy [G60.9] 04/03/2016  . Exertional shortness of breath [R06.02] 01/26/2016  . Left leg pain [M79.605] 01/26/2016  . Trochanteric bursitis of both hips [M70.61, M70.62] 12/19/2015  . Depression [F32.9] 08/28/2015  . Fibromyalgia [M79.7] 07/28/2015  . Right wrist pain [M25.531] 04/05/2015  . Routine general medical examination at a health care facility [Z00.00] 03/01/2014  . HTN (hypertension) [I10] 01/12/2014  . Morbid obesity ([place]) [E66.01] 01/12/2014  . Pre-diabetes [R73.03] 01/12/2014  . Family history of lupus erythematosus [Z84.0] 01/12/2014  . Fatigue [R53.83] 10/17/2013    Total Time spent with patient: 30 minutes  Musculoskeletal: Strength & Muscle Tone: decreased Gait & Station: shuffle Patient leans: N/A  Psychiatric Specialty Exam: Review of Systems  All other systems reviewed and are negative.   Blood pressure 102/68, pulse 99, temperature (!) 97.5 F (36.4 C), temperature source Oral, resp. rate 16, height 5' 6.75" (1.695 m), weight 116.1 kg (256 lb).Body mass index is 40.4 kg/m.  General Appearance: Casual  Eye Contact::  Good  Speech:  Normal Rate409  Volume:  Normal  Mood:  Euthymic  Affect:  Congruent  Thought Process:  Coherent  Orientation:  Full (Time, Place, and Person)  Thought Content:  Logical  Suicidal Thoughts:  No  Homicidal Thoughts:  No  Memory:  Immediate;   Fair  Judgement:  Intact  Insight:  Fair  Psychomotor Activity:  Normal  Concentration:  Good  Recall:  [place]  of Knowledge:Good  Language: Good  Akathisia:  Negative  Handed:  Right  AIMS (if indicated):     Assets:  Communication Skills Desire for Improvement Financial Resources/Insurance Housing Resilience  Sleep:  Number of Hours: 6.75  Cognition: WNL  ADL's:  Intact   Mental Status Per Nursing Assessment::   On Admission:  Self-harm thoughts  Demographic Factors:  Unemployed  Loss Factors: NA  Historical Factors: Impulsivity  Risk Reduction Factors:   Sense of responsibility to family, Living with another person, especially a relative, Positive social support and Positive coping skills or problem solving skills  Continued Clinical Symptoms:  Previous Psychiatric Diagnoses and Treatments  Cognitive Features That Contribute To Risk:  None    Suicide Risk:  Minimal: No identifiable suicidal ideation.  Patients presenting with no risk factors but with morbid ruminations; may be classified as minimal risk based on the severity of the depressive symptoms  [facility]. Go on 05/21/2018.   Why:  Please attend your therapy appt with Alexia Freestone on Thursday, 05/21/18, at 1:00pm.  Please contact the office within 24 hours of discharge to confirm this appt and pay the $20 deposit. Contact information: [place] [address] [phone]        [facility]. Go on 05/26/2018.   Why:  Please attend your medication appt with Kerrie Pleasure on Tuesday, 05/26/18, at 10:30am.  Please request that subsequent medication appts be scheduled in [place]. Contact information: [address]  [place], [place] [address] P: [phone] F: [phone]        [facility]. Go on 05/12/2018.   Specialty:  Behavioral Health Why:  Please attend your intake appt for the Partial Hospitalization Program on Tuesday, 05/12/18, at 1:30pm. Contact information: [place] [phone]          Plan Of Care/Follow-up recommendations:  Activity:  ad lib  Sharma Covert, MD 05/08/2018, 7:51 AM

## 2018-05-08 NOTE — Discharge Summary (Signed)
Physician Discharge Summary Note  Patient:  Linda Buckley is an 62 y.o., female MRN:  [id number] DOB:  [birth year] Patient phone:  [phone] (home)  Patient address:   [address] [place] [address],  Total Time spent with patient: 45 minutes  Date of Admission:  05/04/2018 Date of Discharge: 05/08/2018  Reason for Admission:  Patient is seen and examined.  Patient is a 62 year old female with a reported past psychiatric history significant for depression and posttraumatic stress disorder who presented to the [facility] emergency department yesterday with suicidal ideation.  The patient stated she had seen her primary care provider earlier in the day, and expressed some suicidal ideation.  The primary care provider referred her to the emergency department.  She was seen in the emergency department and the decision was made to admit her to the hospital for depression and suicidal ideation.  She has a multiyear history of depression.  She has been on Cymbalta for fibromyalgia as well as depression.  She last saw a psychiatrist approximately 1 year ago.  She stated she was suffering from helplessness, hopelessness and worthlessness.  She admitted the fatigue, poor sleep and suicidal ideation without plan.  She was tearful throughout the interview.  She had no previous psychiatric admissions.  She stated that the biggest new stressors she had in her life was that she had to kick her son out of the house because of his drug use.  She was admitted to the hospital for evaluation and stabilization. Associated Signs/Symptoms: Depression Symptoms:  depressed mood, anhedonia, insomnia, psychomotor agitation, fatigue, feelings of worthlessness/guilt, difficulty concentrating, hopelessness, suicidal thoughts without plan, anxiety, loss of energy/fatigue, disturbed sleep, (Hypo) Manic Symptoms:  Impulsivity, Anxiety Symptoms:  Excessive Worry, Psychotic Symptoms:  Denied PTSD  Symptoms: Had a traumatic exposure:  In the past  Past Psychiatric History: Patient has no previous admissions.  She is been treated with multiple antidepressants in the past.  She is on Cymbalta 60 mg p.o. twice daily currently.  Principal Problem: MDD (major depressive disorder), recurrent episode, severe [facility]) Discharge Diagnoses: Patient Active Problem List   Diagnosis Date Noted  . MDD (major depressive disorder), recurrent episode, severe ([place]) [F33.2] 05/05/2018  . Vitamin D deficiency [E55.9] 11/05/2017  . Dysphagia [R13.10] 09/02/2017  . OSA (obstructive sleep apnea) [G47.33] 02/14/2017  . Multiple falls [R29.6] 09/04/2016  . Hereditary and idiopathic peripheral neuropathy [G60.9] 04/03/2016  . Exertional shortness of breath [R06.02] 01/26/2016  . Left leg pain [M79.605] 01/26/2016  . Trochanteric bursitis of both hips [M70.61, M70.62] 12/19/2015  . Depression [F32.9] 08/28/2015  . Fibromyalgia [M79.7] 07/28/2015  . Right wrist pain [M25.531] 04/05/2015  . Routine general medical examination at a health care facility [Z00.00] 03/01/2014  . HTN (hypertension) [I10] 01/12/2014  . Morbid obesity ([place]) [E66.01] 01/12/2014  . Pre-diabetes [R73.03] 01/12/2014  . Family history of lupus erythematosus [Z84.0] 01/12/2014  . Fatigue [R53.83] 10/17/2013    Past Medical History:  Past Medical History:  Diagnosis Date  . Allergy   . Anxiety   . Arthritis   . Carpal tunnel syndrome of right wrist    RECURRENT  . Depression   . Dysphagia 09/02/2017  . Edema 09/02/2017  . Environmental allergies    dust mites, pollen, roaches and other insects, mold  . Headache    migraines  . Hereditary and idiopathic peripheral neuropathy 04/03/2016  . History of adenomatous polyp of colon   . History of gastric ulcer   . History of  kidney stones   . Hypertension   . Pneumonia   . Sleep apnea    wears CPAP  . Wears glasses     Past Surgical History:  Procedure Laterality Date  .  CARPAL TUNNEL RELEASE Right 2007  . CARPAL TUNNEL RELEASE Right 07/05/2015   Procedure: RIGHT HAND REVISION CARPAL TUNNEL RELEASE;  Surgeon: Iran Planas, MD;  Location: [facility];  Service: Orthopedics;  Laterality: Right;  . CARPAL TUNNEL RELEASE Left 09/2016  . CERVICAL DISC ARTHROPLASTY  03/2015  . COLONOSCOPY W/ POLYPECTOMY  04-15-2014  . CYSTO/  RIGHT RETROGRADE PYELOGRAM/ URETEROSCOPY STONE EXTRACTION/  STENT PLACEMENT  06-07-2010  . LAPAROSCOPIC ASSISTED VAGINAL HYSTERECTOMY  04-25-2004   ovaries remain  . TUBAL LIGATION  1982   Family History:  Family History  Problem Relation Age of Onset  . Asthma Father   . Diabetes Father   . Arthritis Mother   . Hypertension Mother   . Diabetes Mother   . Cancer Mother   . Stomach cancer Mother   . Diabetes Brother   . Diabetes Brother   . Asthma Son   . Cancer Maternal Grandfather        colon  . Colon cancer Maternal Grandfather   . Cancer Maternal Aunt        breast  . Cancer Maternal Uncle        lung  . Colon cancer Maternal Uncle   . Cancer Maternal Grandmother        ovarian  . Diabetes Son    Family Psychiatric  History: Several family members with depression Social History:  Social History   Substance and Sexual Activity  Alcohol Use Yes   Comment: 2 glasses of wine weekly     Social History   Substance and Sexual Activity  Drug Use No   Comment: prescription for klonopin and oxy IR    Social History   Socioeconomic History  . Marital status: Married    Spouse name: Not on file  . Number of children: 2  . Years of education: 2 yrs college  . Highest education level: Not on file  Occupational History  . 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 Financial resource strain: Not on file  . Food insecurity:    Worry: Not on file    Inability: Not on file  . Transportation needs:    Medical: Not on file    Non-medical: Not on file  Tobacco Use  . Smoking status: Never Smoker  .  Smokeless tobacco: Never Used  Substance and Sexual Activity  . Alcohol use: Yes    Comment: 2 glasses of wine weekly  . Drug use: No    Comment: prescription for klonopin and oxy IR  . Sexual activity: Yes    Comment: partial hysterectomy  Lifestyle  . Physical activity:    Days per week: Not on file    Minutes per session: Not on file  . Stress: Not on file  Relationships  . Social connections:    Talks on phone: Not on file    Gets together: Not on file    Attends religious service: Not on file    Active member of club or organization: Not on file    Attends meetings of clubs or organizations: Not on file    Relationship status: Not on file  Other Topics Concern  . Not on file  Social History Narrative   Lives at home  w/ her husband   Right-handed   Drinks 2 cups of coffee weekly    3 bottle water per day    Hospital Course: ANNALYSIA WILLENBRING was admitted for MDD (major depressive disorder), recurrent episode, severe ([place]) and crisis management. She was treated with the following medications Wellbutrin XL 150mg , Clonazepam 0.5mg  po qhs, Cymbalta 60mg  po BID, Trazodone 50 mg po qhs.  [name] was discharged with current medication and was instructed on how to take medications as prescribed; (details listed below under Medication List).  Medical problems were identified and treated as needed.  Home medications were restarted as appropriate.Labs are normal.   Improvement was monitored by observation and [name] daily report of symptom reduction.  Emotional and mental status was monitored by daily self-inventory reports completed by [name] and clinical staff.         MCKYNZI CAMMON was evaluated by the treatment team for stability and plans for continued recovery upon discharge.  LEONIA HEATHERLY motivation was an integral factor for scheduling further treatment.  Employment, transportation, bed availability, health status, family support, and any pending  legal issues were also considered during her hospital stay.  She was offered further treatment options upon discharge including but not limited to Residential, Intensive Outpatient, and Outpatient treatment.  RAZAN SILER will follow up with the services as listed below under Follow Up Information.     Upon completion of this admission the ALVERIA MCGLAUGHLIN was both mentally and medically stable for discharge denying suicidal/homicidal ideation, auditory/visual/tactile hallucinations, delusional thoughts and paranoia.      Physical Findings: AIMS: Facial and Oral Movements Muscles of Facial Expression: None, normal Lips and Perioral Area: None, normal Jaw: None, normal Tongue: None, normal,Extremity Movements Upper (arms, wrists, hands, fingers): None, normal Lower (legs, knees, ankles, toes): None, normal, Trunk Movements Neck, shoulders, hips: None, normal, Overall Severity Severity of abnormal movements (highest score from questions above): None, normal Incapacitation due to abnormal movements: None, normal Patient's awareness of abnormal movements (rate only patient's report): No Awareness, Dental Status Current problems with teeth and/or dentures?: No Does patient usually wear dentures?: No  CIWA:    COWS:     Musculoskeletal: Strength & Muscle Tone: within normal limits Gait & Station: normal Patient leans: N/A  Psychiatric Specialty Exam: See MD SRA Physical Exam  ROS  Blood pressure 102/68, pulse 99, temperature (!) 97.5 F (36.4 C), temperature source Oral, resp. rate 16, height 5' 6.75" (1.695 m), weight 116.1 kg (256 lb).Body mass index is 40.4 kg/m.  Sleep:  Number of Hours: 6.75     Have you used any form of tobacco in the last 30 days? (Cigarettes, Smokeless Tobacco, Cigars, and/or Pipes): No  Has this patient used any form of tobacco in the last 30 days? (Cigarettes, Smokeless Tobacco, Cigars, and/or Pipes)  No  Blood Alcohol level:  Lab Results  Component  Value Date   ETH <10 65/78/4696    Metabolic Disorder Labs:  Lab Results  Component Value Date   HGBA1C 6.0 11/05/2017   No results found for: PROLACTIN Lab Results  Component Value Date   CHOL 169 11/05/2017   TRIG 85.0 11/05/2017   HDL 49.40 11/05/2017   CHOLHDL 3 11/05/2017   VLDL 17.0 11/05/2017   LDLCALC 103 (H) 11/05/2017   [place] 94 10/28/2016    See Psychiatric Specialty Exam and Suicide Risk Assessment completed by Attending Physician prior to discharge.  Discharge destination:  Home  Is patient on multiple antipsychotic therapies at discharge:  No   Has Patient had three or more failed trials of antipsychotic monotherapy by history:  No  Recommended Plan for Multiple Antipsychotic Therapies: NA  Discharge Instructions    Discharge instructions   Complete by:  As directed    Please continue to take medications as directed. If your symptoms return, worsen, or persist please call your 911, report to local ER, or contact crisis hotline. Please do not drink alcohol or use any illegal substances while taking prescription medications.     Allergies as of 05/08/2018      Reactions   Aspirin Swelling   Sweating/ swelling of hands and face    Bee Venom Swelling   Swelling at site       Medication List    STOP taking these medications   clotrimazole-betamethasone cream Commonly known as:  LOTRISONE   diclofenac sodium 1 % Gel Commonly known as:  VOLTAREN   omeprazole 40 MG capsule Commonly known as:  PRILOSEC Replaced by:  pantoprazole 40 MG tablet     TAKE these medications     Indication  beclomethasone 80 MCG/ACT inhaler Commonly known as:  QVAR REDIHALER Inhale 1 puff into the lungs 2 (two) times daily.  Indication:  Asthma   buPROPion 300 MG 24 hr tablet Commonly known as:  WELLBUTRIN XL Take 1 tablet (300 mg total) by mouth daily. Start taking on:  05/09/2018 What changed:  See the new instructions.  Indication:  Attention Deficit  Hyperactivity Disorder, Major Depressive Disorder   cetirizine 10 MG tablet Commonly known as:  ZYRTEC Take 1 tablet (10 mg total) by mouth daily.  Indication:  Upper Respiratory Tract Allergy   clonazePAM 0.5 MG tablet Commonly known as:  KLONOPIN TAKE 1 TABLET BY MOUTH AT BEDTIME  Indication:  Panic Disorder   DULoxetine 60 MG capsule Commonly known as:  CYMBALTA Take 1 capsule (60 mg total) by mouth 2 (two) times daily.  Indication:  Major Depressive Disorder   EPIPEN 0.3 mg/0.3 mL Soaj injection Generic drug:  EPINEPHrine Inject 0.3 mg into the muscle once.  Indication:  Life-Threatening Hypersensitivity Reaction   furosemide 20 MG tablet Commonly known as:  LASIX TAKE 1 TABLET(20 MG) BY MOUTH DAILY  Indication:  Edema, High Blood Pressure Disorder   losartan-hydrochlorothiazide 50-12.5 MG tablet Commonly known as:  HYZAAR TAKE 1 TABLET BY MOUTH EVERY DAY  Indication:  High Blood Pressure Disorder   Oxycodone HCl 10 MG Tabs TK 1 T PO Q 8 H PRN P  Indication:  Chronic Pain   pantoprazole 40 MG tablet Commonly known as:  PROTONIX Take 1 tablet (40 mg total) by mouth daily. Start taking on:  05/09/2018 Replaces:  omeprazole 40 MG capsule  Indication:  Gastroesophageal Reflux Disease   pregabalin 200 MG capsule Commonly known as:  LYRICA Take 1 capsule (200 mg total) by mouth 2 (two) times daily.  Indication:  Generalized Anxiety Disorder, Neuropathic Pain   ranitidine 300 MG tablet Commonly known as:  ZANTAC TAKE 1 TABLET(300 MG) BY MOUTH AT BEDTIME  Indication:  Gastroesophageal Reflux Disease   traZODone 50 MG tablet Commonly known as:  DESYREL Take 1 tablet (50 mg total) by mouth at bedtime as needed for sleep. What changed:  See the new instructions.  Indication:  Trouble Sleeping   VENTOLIN HFA 108 (90 Base) MCG/ACT inhaler Generic drug:  albuterol INHALE 2 PUFFS BY MOUTH EVERY 4 HOURS AS NEEDED FOR SHORTNESS OF BREATH  Indication:  Asthma    VITAMIN D PO Take 1 tablet by mouth daily. 1000units  Indication:  VITAMIN D DEFICEINCY   vitamin E 1000 UNIT capsule Take by mouth.  Indication:  VITAMIN D [place]. Go on 05/21/2018.   Why:  Please attend your therapy appt with Alexia Freestone on Thursday, 05/21/18, at 1:00pm.  Please contact the office within 24 hours of discharge to confirm this appt and pay the $20 deposit. Contact information: [place] [address] [phone]        [facility]. Go on 05/26/2018.   Why:  Please attend your medication appt with Kerrie Pleasure on Tuesday, 05/26/18, at 10:30am.  Please request that subsequent medication appts be scheduled in [place]. Contact information: [address]  [place], [place] [address] P: [phone] F: [phone]        [facility]. Go on 05/12/2018.   Specialty:  Behavioral Health Why:  Please attend your intake appt for the Partial Hospitalization Program on Tuesday, 05/12/18, at 1:30pm. Contact information: [place] [phone]          Follow-up recommendations:  Activity:  Increase activity as tolerated. Diet:  Routine house diet as discussed. Tests:  Routine test as suggested by outpatient psychiatrist.  All labs obtained while inpatient have been reviewed and assessed and determined to be within normal. Other:  Even if you began to feel better continue taking her medication until otherwise stated by psychiatrist.   Signed: Nanci Pina, [place] 05/08/2018, 9:27 AM

## 2018-05-08 NOTE — Progress Notes (Signed)
D: pt presented to the medication window, pleasant and smiling. Pt stated she slept well last night. Pt denies any depression/hopelessness/anxiety a 0/10. Pt denies any si/hi/ah/vh and verbally contracts to approach staff if these become apparent. Pt stated her goal for today is to continue to work on her reading and planning my routine for classes next week and to attend them. Pt will achieve this by continuing to attend classes and continue to work on her plans. Pt acknowledges staff and their hard work.  A: pt given support and encouragement. Pt given medication per protocol and standing orders. q6m checks implemented and continued. R: pt safe on the unit. Will continue to monitor.

## 2018-05-08 NOTE — Plan of Care (Signed)
Patient verbalizes readiness for discharge. Follow up plan explained, AVS, Transition record and SRA given. Prescriptions and teaching provided. Belongings returned and signed for. Suicide safety plan completed and signed. Patient verbalizes understanding. Patient denies SI/HI and assures this Probation officer he will seek assistance should that change. Patient discharged to lobby.  Problem: Education: Goal: Knowledge of [facility] Education information/materials will improve Outcome: Adequate for Discharge Goal: Emotional status will improve 05/08/2018 1437 by Baron Sane, RN Outcome: Adequate for Discharge 05/08/2018 1100 by Baron Sane, RN Outcome: Progressing Goal: Mental status will improve 05/08/2018 1437 by Baron Sane, RN Outcome: Adequate for Discharge 05/08/2018 1100 by Baron Sane, RN Outcome: Progressing Goal: Verbalization of understanding the information provided will improve 05/08/2018 1437 by Baron Sane, RN Outcome: Adequate for Discharge 05/08/2018 1100 by Baron Sane, RN Outcome: Progressing   Problem: Activity: Goal: Interest or engagement in activities will improve 05/08/2018 1437 by Baron Sane, RN Outcome: Adequate for Discharge 05/08/2018 1100 by Baron Sane, RN Outcome: Progressing Goal: Sleeping patterns will improve 05/08/2018 1437 by Baron Sane, RN Outcome: Adequate for Discharge 05/08/2018 1100 by Baron Sane, RN Outcome: Progressing   Problem: Coping: Goal: Ability to verbalize frustrations and anger appropriately will improve Outcome: Adequate for Discharge Goal: Ability to demonstrate self-control will improve 05/08/2018 1437 by Baron Sane, RN Outcome: Adequate for Discharge 05/08/2018 1100 by Baron Sane, RN Outcome: Progressing   Problem: Health Behavior/Discharge Planning: Goal: Identification of resources available to assist in meeting health care needs will  improve Outcome: Adequate for Discharge Goal: Compliance with treatment plan for underlying cause of condition will improve Outcome: Adequate for Discharge   Problem: Physical Regulation: Goal: Ability to maintain clinical measurements within normal limits will improve Outcome: Adequate for Discharge   Problem: Safety: Goal: Periods of time without injury will increase 05/08/2018 1437 by Baron Sane, RN Outcome: Adequate for Discharge 05/08/2018 1100 by Baron Sane, RN Outcome: Progressing   Problem: Education: Goal: Utilization of techniques to improve thought processes will improve Outcome: Adequate for Discharge Goal: Knowledge of the prescribed therapeutic regimen will improve 05/08/2018 1437 by Baron Sane, RN Outcome: Adequate for Discharge 05/08/2018 1100 by Baron Sane, RN Outcome: Progressing   Problem: Activity: Goal: Interest or engagement in leisure activities will improve Outcome: Adequate for Discharge Goal: Imbalance in normal sleep/wake cycle will improve Outcome: Adequate for Discharge   Problem: Coping: Goal: Coping ability will improve 05/08/2018 1437 by Baron Sane, RN Outcome: Adequate for Discharge 05/08/2018 1100 by Baron Sane, RN Outcome: Progressing Goal: Will verbalize feelings 05/08/2018 1437 by Baron Sane, RN Outcome: Adequate for Discharge 05/08/2018 1100 by Baron Sane, RN Outcome: Progressing   Problem: Health Behavior/Discharge Planning: Goal: Ability to make decisions will improve Outcome: Adequate for Discharge Goal: Compliance with therapeutic regimen will improve 05/08/2018 1437 by Baron Sane, RN Outcome: Adequate for Discharge 05/08/2018 1100 by Baron Sane, RN Outcome: Progressing   Problem: Role Relationship: Goal: Will demonstrate positive changes in social behaviors and relationships Outcome: Adequate for Discharge   Problem: Safety: Goal: Ability to  disclose and discuss suicidal ideas will improve 05/08/2018 1437 by Baron Sane, RN Outcome: Adequate for Discharge 05/08/2018 1100 by Baron Sane, RN Outcome: Progressing Goal: Ability to identify and utilize support systems that promote safety will improve Outcome: Adequate for Discharge   Problem: Self-Concept: Goal: Will verbalize positive feelings about self Outcome: Adequate for  Discharge Goal: Level of anxiety will decrease 05/08/2018 1437 by Baron Sane, RN Outcome: Adequate for Discharge 05/08/2018 1100 by Baron Sane, RN Outcome: Progressing   Problem: Education: Goal: Ability to make informed decisions regarding treatment will improve Outcome: Adequate for Discharge   Problem: Coping: Goal: Coping ability will improve 05/08/2018 1437 by Baron Sane, RN Outcome: Adequate for Discharge 05/08/2018 1100 by Baron Sane, RN Outcome: Progressing   Problem: Health Behavior/Discharge Planning: Goal: Identification of resources available to assist in meeting health care needs will improve Outcome: Adequate for Discharge   Problem: Medication: Goal: Compliance with prescribed medication regimen will improve 05/08/2018 1437 by Baron Sane, RN Outcome: Adequate for Discharge 05/08/2018 1100 by Baron Sane, RN Outcome: Progressing   Problem: Self-Concept: Goal: Ability to disclose and discuss suicidal ideas will improve Outcome: Adequate for Discharge Goal: Will verbalize positive feelings about self Outcome: Adequate for Discharge   Problem: Consults Goal: Concurrent Medical Patient Education Description (See Patient Education Module for education specifics) Outcome: Adequate for Discharge   Problem: [facility] Concurrent Medical Problem Goal: LTG-Pt will be physically stable and he/significant other Description (Patient will be physically stable and he/significant other will be able to verbalize understanding of follow-up  care and symptoms that would warrant further treatment) Outcome: Adequate for Discharge Goal: STG-Vital signs will be within defined limits or stabilized Description (STG- Vital signs will be within defined limits or stabilized for individual) Outcome: Adequate for Discharge Goal: STG-Compliance with medication and/or treatment as ordered Description (STG-Compliance with medication and/or treatment as ordered by MD) Outcome: Adequate for Discharge Goal: STG-Verbalize two symptoms that would warrant further Description (STG-Verbalize two symptoms that would warrant further treatment) Outcome: Adequate for Discharge Goal: STG-Patient will participate in management/stabilization Description (STG-Patient will participate in management/stabilization of medical condition) Outcome: Adequate for Discharge Goal: STG-Other (Specify): Description STG-Other Concurrent Medical (Specify): Outcome: Adequate for Discharge

## 2018-05-08 NOTE — Plan of Care (Signed)
Pt progressing in the following metrics. Pt denies any si/hi/ah/vh and verbally agrees to approach staff if these become apparent. Pt denies any depression/hopelessness/anxiety. Q63m checks implemented and continued. Will continue to monitor.  Problem: Education: Goal: Emotional status will improve Outcome: Progressing Goal: Mental status will improve Outcome: Progressing Goal: Verbalization of understanding the information provided will improve Outcome: Progressing   Problem: Activity: Goal: Interest or engagement in activities will improve Outcome: Progressing Goal: Sleeping patterns will improve Outcome: Progressing   Problem: Coping: Goal: Ability to demonstrate self-control will improve Outcome: Progressing   Problem: Safety: Goal: Periods of time without injury will increase Outcome: Progressing   Problem: Education: Goal: Knowledge of the prescribed therapeutic regimen will improve Outcome: Progressing   Problem: Coping: Goal: Coping ability will improve Outcome: Progressing Goal: Will verbalize feelings Outcome: Progressing   Problem: Health Behavior/Discharge Planning: Goal: Compliance with therapeutic regimen will improve Outcome: Progressing   Problem: Safety: Goal: Ability to disclose and discuss suicidal ideas will improve Outcome: Progressing   Problem: Self-Concept: Goal: Level of anxiety will decrease Outcome: Progressing   Problem: Coping: Goal: Coping ability will improve Outcome: Progressing   Problem: Medication: Goal: Compliance with prescribed medication regimen will improve Outcome: Progressing

## 2018-05-08 NOTE — Progress Notes (Signed)
Patient verbalizes readiness for discharge. Follow up plan explained, AVS, Transition record and SRA given. Prescriptions and teaching provided. Belongings returned and signed for. Suicide safety plan completed and signed. Patient verbalizes understanding. Patient denies SI/HI and assures this Probation officer he will seek assistance should that change. Patient discharged to lobby.

## 2018-05-08 NOTE — Progress Notes (Signed)
Patient ID: Linda Buckley, female   DOB: 1956/10/07, 62 y.o.   MRN: [id number] DAR Note: Pt observed ambulating on the unit with the help of a walker. Pt with pained affect denied any anxiety, depression SI/HI or AVH; "the doctor said if everything went well tonight, I may go home tomorrow." Pt complained about severe chronic back pain. Medications offered as prescribed. All patient's questions and concerns addressed. Support, encouragement, and safe environment provided. Pt was med compliant.

## 2018-05-08 NOTE — Progress Notes (Signed)
  [facility] Discharge Plan :  Will you be returning to the same living situation after discharge:  Yes,  own home At discharge, do you have transportation home?: Yes,  sister Do you have the ability to pay for your medications: Yes,  BCBS  Release of information consent forms completed and in the chart;  Patient's signature needed at discharge.  Patient to Follow up at: Follow-up [facility]. Go on 05/21/2018.   Why:  Please attend your therapy appt with Alexia Freestone on Thursday, 05/21/18, at 1:00pm.  Please contact the office within 24 hours of discharge to confirm this appt and pay the $20 deposit. Contact information: [place] [address] [phone]        [facility]. Go on 05/26/2018.   Why:  Please attend your medication appt with Kerrie Pleasure on Tuesday, 05/26/18, at 10:30am.  Please request that subsequent medication appts be scheduled in [place]. Contact information: [address],  [place], [place] [address] P: [phone] F: [phone]        [facility]. Go on 05/12/2018.   Specialty:  Behavioral Health Why:  Please attend your intake appt for the Partial Hospitalization Program on Tuesday, 05/12/18, at 1:30pm. Contact information: [place] [phone]          Next level of care provider has access to [place] and Suicide Prevention discussed: Yes,  with sister  Have you used any form of tobacco in the last 30 days? (Cigarettes, Smokeless Tobacco, Cigars, and/or Pipes): No  Has patient been referred to the Quitline?: N/A patient is not a smoker  Patient has been referred for addiction treatment: N/A  Joanne Chars, LCSW 05/08/2018, 8:51 AM

## 2018-05-09 ENCOUNTER — Other Ambulatory Visit: Payer: Self-pay | Admitting: Family Medicine

## 2018-05-12 ENCOUNTER — Other Ambulatory Visit (HOSPITAL_COMMUNITY): Payer: BLUE CROSS/BLUE SHIELD | Admitting: Licensed Clinical Social Worker

## 2018-05-12 DIAGNOSIS — F332 Major depressive disorder, recurrent severe without psychotic features: Secondary | ICD-10-CM

## 2018-05-12 NOTE — Telephone Encounter (Signed)
Last OV 05/04/2018, No future OV at this time  I do not see this medication on the current or past medication list.  Please advise if okay to fill?

## 2018-05-13 ENCOUNTER — Encounter (HOSPITAL_COMMUNITY): Payer: Self-pay | Admitting: Family

## 2018-05-13 ENCOUNTER — Other Ambulatory Visit (HOSPITAL_COMMUNITY): Payer: BLUE CROSS/BLUE SHIELD | Attending: Psychiatry | Admitting: Licensed Clinical Social Worker

## 2018-05-13 DIAGNOSIS — F331 Major depressive disorder, recurrent, moderate: Secondary | ICD-10-CM | POA: Diagnosis present

## 2018-05-13 NOTE — Psych (Signed)
[facility] PHP THERAPIST PROGRESS NOTE  Linda Buckley [id number]  Session Time: 9:00 - 10:15  Participation Level: Active  Behavioral Response: CasualAlertDepressed  Type of Therapy: Group Therapy; psychotherapy  Treatment Goals addressed: Coping  Interventions: CBT, DBT, Solution Focused, Supportive and Reframing  Summary: . Clinician led check-in regarding current stressors and situation, and review of patient completed daily inventory. Clinician utilized active listening and empathetic response and validated patient emotions. Clinician facilitated processing group on pertinent issues.    Therapist Response: Linda Buckley is a 62 y.o. female who presents with depression symptoms. Patient arrived within time allowed and reports that she is feeling "relieved." Patient rates her mood at a 7 on a scale of 1-10 with 10 being great. Pt reports her family has been supportive and she spent a lot of time worrying they would reject her. Pt reports feeling hopeful about treatment and "ready to start." Patient reports needing to work on priorities. Patient engaged in discussion.       Session Time: 10:15 - 11:00  Participation Level: Active  Behavioral Response: CasualAlertDepressed  Type of Therapy: Group Therapy, Psychoeducation; Psychotherapy  Treatment Goals addressed: Coping  Interventions: CBT; Solution focused; Supportive; Reframing  Summary: Clinician introduced topic of "Positive Psychology." Group watched "The Happiness Advantage" TED talk and discussed how the "lens" through which they view life affects the way they feel. Pts identified a strategy they would be willing to try to change their "lens."    Therapist Response:  Pt engaged in discussion regarding ways to train your mind to scan for the positive. Pt reports willingness to try positive journaling as a way to practice.         Session Time: 11:00 -12:15  Participation Level:  Active  Behavioral Response: CasualAlertDepressed  Type of Therapy: Group Therapy, psychotherapy  Treatment Goals addressed: Coping  Interventions: Strengths based, reframing, Supportive,   Summary:  Spiritual Care group  Therapist Response: Patient engaged in group. See chaplain note.         Session Time: 12:15 - 1:00  Participation Level: Minimal  Behavioral Response: CasualAlertDepressed  Type of Therapy: Group Therapy, Activity Therapy  Treatment Goals addressed: Coping  Interventions: Systems analyst, Supportive  Summary:  Reflection Group: Patients encouraged to practice skills and interpersonal techniques or work on mindfulness and relaxation techniques. The importance of self-care and making skills part of a routine to increase usage were stressed   Therapist Response: Patient engaged and participated appropriately.        Session Time: 1:00- 2:00  Participation Level: Active  Behavioral Response: CasualAlertDepressed  Type of Therapy: Group Therapy, Psychoeducation, Activity therapy  Treatment Goals addressed: Coping  Interventions: relaxation training; Supportive; Reframing  Summary: 12:45 - 1:50: Relaxation group: Cln led group focused on retraining the body's response to stress.   1:50 -2:00 Clinician led check-out. Clinician assessed for immediate needs, medication compliance and efficacy, and safety concerns   Therapist Response: Patient engaged activity and discussion. At [place], patient rates her mood at a 8 on a scale of 1-10 with 10 being great. Patient reports that she is going to rest and work on organizing her bathroom this afternoon. Patient demonstrates some progress as evidenced by engaging in first session. Patient denies SI/HI/self-harm thoughts at the end of group.      Suicidal/Homicidal: Nowithout intent/plan   Plan: Pt will continue in PHP and work to decrease depression  symptoms and increase ability to self manage symptoms as they arise.  Diagnosis: Moderate episode of recurrent major depressive disorder (HCC) [F33.1]    1. Moderate episode of recurrent major depressive disorder ([place])       [name], LCSW 05/13/2018
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
## 2018-05-13 NOTE — Psych (Signed)
Behavioral Health Partial Program Assessment Note  Date: 05/13/2018 Name: Linda Buckley MRN: [id number]    Subjective:  Worsening Depression  HPI: Patient is a 62 y.o. African American female presents with suicidal ideation and depression.  Reports she was recently treated and evaluated inpatient at [facility] behavioral health.  Reports she felt overwhelmed with mood irritability for the past few months.  Reports she had thoughts of driving her car off the bridge into water.  Reports she is been struggling with depression for a while states she has been taking Wellbutrin for her mood and states she does not feel her medications was helping her. States she often is confused and states it due to the medications.   Reports her medications as prescribed by her primary care provider.  Denies that she is followed by psychiatry or therapist at this time.   Patient reports multiple medical concerns with pain, neuropathy and carpal tunnel. 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  States her depression worsened after the loss of her house as they were in the process renting to  Own. They ( she and her husband of 7 years)  then learned that the house is in foreclosure.  States they had 45 days to move out. States moving to an apartment had provided her with her challenges an additional stress  due to young neighbors. State she noticed little things started to bother her and she became increasingly  Irritable with mood changes.  Support encouragement reassurance was provided.  Primary complaints include: concern about health problems, depression worse, feeling suicidal, financial problems and increased irritability.  Onset of symptoms was gradual with gradually worsening course since that time. Psychosocial Stressors include the following: family, financial and health. patient was enrolled in partial psychiatric program on 05/13/18.  I have reviewed the  following documentation dated 05/13/2018: past psychiatric history, past medical history and past social and family history  Complaints of Pain: none and location- generalized  Past Psychiatric History:  Previous inpatient admissions  Currently in treatment with Wellbutrin XL 300 mg and Cymbalta 60 mg BID, Klonopin 0.5mg  PRN   Substance Abuse History: alcohol Use of Alcohol: occasional, social use reports drinking wine and this was the first time in a years time that she had a few drinks, due to medications  Use of Caffeine:    Use of over the counter:   Past Surgical History:  Procedure Laterality Date  . CARPAL TUNNEL RELEASE Right 2007  . CARPAL TUNNEL RELEASE Right 07/05/2015   Procedure: RIGHT HAND REVISION CARPAL TUNNEL RELEASE;  Surgeon: Iran Planas, MD;  Location: [place];  Service: Orthopedics;  Laterality: Right;  . CARPAL TUNNEL RELEASE Left 09/2016  . CERVICAL DISC ARTHROPLASTY  03/2015  . COLONOSCOPY W/ POLYPECTOMY  04-15-2014  . CYSTO/  RIGHT RETROGRADE PYELOGRAM/ URETEROSCOPY STONE EXTRACTION/  STENT PLACEMENT  06-07-2010  . LAPAROSCOPIC ASSISTED VAGINAL HYSTERECTOMY  04-25-2004   ovaries remain  . TUBAL LIGATION  1982    Past Medical History:  Diagnosis Date  . Allergy   . Anxiety   . Arthritis   . Carpal tunnel syndrome of right wrist    RECURRENT  . Depression   . Dysphagia 09/02/2017  . Edema 09/02/2017  . Environmental allergies    dust mites, pollen, roaches and other insects, mold  . Headache    migraines  . Hereditary and idiopathic peripheral neuropathy 04/03/2016  . History of adenomatous polyp of colon   .  History of gastric ulcer   . History of kidney stones   . Hypertension   . Pneumonia   . Sleep apnea    wears CPAP  . Wears glasses    Outpatient Encounter Medications as of 05/13/2018  Medication Sig Note  . beclomethasone (QVAR REDIHALER) 80 MCG/ACT inhaler Inhale 1 puff into the lungs 2 (two) times daily.   Marland Kitchen buPROPion (WELLBUTRIN XL) 300  MG 24 hr tablet Take 1 tablet (300 mg total) by mouth daily.   . cetirizine (ZYRTEC) 10 MG tablet Take 1 tablet (10 mg total) by mouth daily.   . Cholecalciferol (VITAMIN D PO) Take 1 tablet by mouth daily. 1000units   . clonazePAM (KLONOPIN) 0.5 MG tablet TAKE 1 TABLET BY MOUTH AT BEDTIME   . diclofenac sodium (VOLTAREN) 1 % GEL APPLY 4 GRAMS EXTERNALLY TO THE AFFECTED AREA FOUR TIMES DAILY.   . DULoxetine (CYMBALTA) 60 MG capsule Take 1 capsule (60 mg total) by mouth 2 (two) times daily.   Marland Kitchen EPINEPHrine (EPIPEN) 0.3 mg/0.3 mL SOAJ injection Inject 0.3 mg into the muscle once.   . furosemide (LASIX) 20 MG tablet TAKE 1 TABLET(20 MG) BY MOUTH DAILY   . losartan-hydrochlorothiazide (HYZAAR) 50-12.5 MG tablet TAKE 1 TABLET BY MOUTH EVERY DAY   . Oxycodone HCl 10 MG TABS TK 1 T PO Q 8 H PRN P   . pantoprazole (PROTONIX) 40 MG tablet Take 1 tablet (40 mg total) by mouth daily.   . pregabalin (LYRICA) 200 MG capsule Take 1 capsule (200 mg total) by mouth 2 (two) times daily. 12/01/2017: Faxed printed/signed rx pregabalin to [facility] [address] at [phone]. Received fax confirmation.   . ranitidine (ZANTAC) 300 MG tablet TAKE 1 TABLET(300 MG) BY MOUTH AT BEDTIME (Patient not taking: Reported on 05/04/2018)   . traZODone (DESYREL) 50 MG tablet Take 1 tablet (50 mg total) by mouth at bedtime as needed for sleep.   . VENTOLIN HFA 108 (90 Base) MCG/ACT inhaler INHALE 2 PUFFS BY MOUTH EVERY 4 HOURS AS NEEDED FOR SHORTNESS OF BREATH   . vitamin E 1000 UNIT capsule Take by mouth.    No facility-administered encounter medications on file as of 05/13/2018.    Allergies  Allergen Reactions  . Aspirin Swelling    Sweating/ swelling of hands and face   . Bee Venom Swelling    Swelling at site     Social History   Tobacco Use  . Smoking status: Never Smoker  . Smokeless tobacco: Never Used  Substance Use Topics  . Alcohol use: Yes    Comment: 2 glasses of wine weekly   Functioning  Relationships: good relationship with spouse or significant other and alone & isolated Education: Other Pertinent History:  Family History  Problem Relation Age of Onset  . Asthma Father   . Diabetes Father   . Arthritis Mother   . Hypertension Mother   . Diabetes Mother   . Cancer Mother   . Stomach cancer Mother   . Diabetes Brother   . Diabetes Brother   . Asthma Son   . Cancer Maternal Grandfather        colon  . Colon cancer Maternal Grandfather   . Cancer Maternal Aunt        breast  . Cancer Maternal Uncle        lung  . Colon cancer Maternal Uncle   . Cancer Maternal Grandmother        ovarian  . Diabetes  Son      Review of Systems Constitutional: negative  Objective:  There were no vitals filed for this visit.  Physical Exam:   Mental Status Exam: Appearance:  Casually dressed Psychomotor::  Within Normal Limits Attention span and concentration: Normal Behavior: calm, cooperative and adequate rapport can be established Speech:  normal pitch Mood:  depressed Affect:  normal and mood-congruent Thought Process:  Coherent Thought Content:  Hallucinations: None Orientation:  person, place and time/date Cognition:  grossly intact Insight:  Intact Judgment:  Intact Estimate of Intelligence: Average Fund of knowledge: Aware of current events Memory: Recent and remote intact Abnormal movements: None Gait and station: Unsteady, patient use cane for ambulation assistance's   Assessment:  Diagnosis: Moderate episode of recurrent major depressive disorder ([place]) [F33.1] 1. Moderate episode of recurrent major depressive disorder ([place])     Indications for admission: inpatient care required if not in partial hospital program  Plan: patient enrolled in Partial Hospitalization Program Orders placed for OT occupational therapy  patient's current medications are to be continued, the following medications are being prescribed Wellbutrin 300 mg , Cymbalta 60 mg  and Klonopin 0.5mg   and  and a comprehensive treatment plan will be developed   Patient to bring old prdx Wellburtin bottle in for review/ medication adherences    Treatment options and alternatives reviewed with patient Brylan Seubert  And N.P Myrle Sheng  patient understands the above plan and her need for group services       Derrill Center, NP

## 2018-05-13 NOTE — Psych (Deleted)
[facility] BH PHP THERAPIST PROGRESS NOTE  Linda Buckley [id number]  Session Time: 9:00 - 10:15  Participation Level: Active  Behavioral Response: CasualAlertDepressed  Type of Therapy: Group Therapy; Psychotherapy  Treatment Goals addressed: Coping  Interventions: CBT, DBT, Solution Focused, Supportive and Reframing  Summary: Linda Buckley is a 62 y.o. female who presents with depression symptoms. Clinician led check-in regarding current stressors and situation, and review of patient completed daily inventory. Clinician utilized active listening and empathetic response and validated patient emotions. Clinician facilitated processing group on pertinent issues.   Therapist Response: Patient arrived within time allowed and reports that she is feeling "relieved." Patient rates her mood at a 7 on a scale of 1-10 with 10 being great. Pt reports her family has been supportive and she spent a lot of time worrying they would reject her. Pt reports feeling hopeful about treatment and "ready to start." Patient reports needing to work on priorities. Patient engaged in discussion.     Session Time: 10:15 - 11:00  Participation Level: Active  Behavioral Response: CasualAlertDepressed  Type of Therapy: Group Therapy, Psychoeducation; Psychotherapy  Treatment Goals addressed: Coping  Interventions: CBT; Solution focused; Supportive; Reframing  Summary: Clinician introduced topic of "Positive Psychology." Group watched "The Happiness Advantage" TED talk and discussed how the "lens" through which they view life affects the way they feel. Pts identified a strategy they would be willing to try to change their "lens."    Therapist Response:  Pt engaged in discussion regarding ways to train your mind to scan for the positive. Pt reports willingness to try positive journaling as a way to practice.         Session Time: 11:00 -12:15  Participation Level: Active  Behavioral Response:  CasualAlertDepressed  Type of Therapy: Group Therapy, psychotherapy  Treatment Goals addressed: Coping  Interventions: Strengths based, reframing, Supportive,   Summary:  Spiritual Care group  Therapist Response: Patient engaged in group. See chaplain note.         Session Time: 12:15 - 1:00  Participation Level: Minimal  Behavioral Response: CasualAlertDepressed  Type of Therapy: Group Therapy, Activity Therapy  Treatment Goals addressed: Coping  Interventions: Systems analyst, Supportive  Summary:  Reflection Group: Patients encouraged to practice skills and interpersonal techniques or work on mindfulness and relaxation techniques. The importance of self-care and making skills part of a routine to increase usage were stressed   Therapist Response: Patient engaged and participated appropriately.        Session Time: 1:00- 2:00  Participation Level: Active  Behavioral Response: CasualAlertDepressed  Type of Therapy: Group Therapy, Psychoeducation, Activity therapy  Treatment Goals addressed: Coping  Interventions: relaxation training; Supportive; Reframing  Summary: 12:45 - 1:50: Relaxation group: Cln led group focused on retraining the body's response to stress.   1:50 -2:00 Clinician led check-out. Clinician assessed for immediate needs, medication compliance and efficacy, and safety concerns   Therapist Response: Patient engaged activity and discussion. At [place], patient rates her mood at a 8 on a scale of 1-10 with 10 being great. Patient reports that she is going to rest and work on organizing her bathroom this afternoon. Patient demonstrates some progress as evidenced by engaging in first sesison. Patient denies SI/HI/self-harm thoughts at the end of group.    Suicidal/Homicidal: Nowithout intent/plan   Plan: Pt will continue in PHP and work to decrease depression symptoms and increase ability to self manage symptoms as they arise.     Diagnosis: Severe episode of recurrent  major depressive disorder, without psychotic features ([place]) [F33.2]    1. Severe episode of recurrent major depressive disorder, without psychotic features ([place])       Linda Glass, LCSW 05/13/2018

## 2018-05-14 ENCOUNTER — Encounter (HOSPITAL_COMMUNITY): Payer: Self-pay | Admitting: Occupational Therapy

## 2018-05-14 ENCOUNTER — Other Ambulatory Visit: Payer: Self-pay

## 2018-05-14 ENCOUNTER — Other Ambulatory Visit (HOSPITAL_COMMUNITY): Payer: BLUE CROSS/BLUE SHIELD | Admitting: Occupational Therapy

## 2018-05-14 ENCOUNTER — Other Ambulatory Visit (HOSPITAL_COMMUNITY): Payer: BLUE CROSS/BLUE SHIELD | Admitting: Licensed Clinical Social Worker

## 2018-05-14 DIAGNOSIS — R4589 Other symptoms and signs involving emotional state: Secondary | ICD-10-CM

## 2018-05-14 DIAGNOSIS — F331 Major depressive disorder, recurrent, moderate: Secondary | ICD-10-CM | POA: Diagnosis not present

## 2018-05-14 DIAGNOSIS — F332 Major depressive disorder, recurrent severe without psychotic features: Secondary | ICD-10-CM

## 2018-05-14 DIAGNOSIS — F079 Unspecified personality and behavioral disorder due to known physiological condition: Secondary | ICD-10-CM

## 2018-05-14 NOTE — Therapy (Addendum)
[address] [place], [place], [address] Phone: [phone]   Fax:  [phone]  Occupational Therapy Evaluation  Patient Details  Name: Linda Buckley MRN: [id number] Date of Birth: [birth year] Referring Provider: Ricky Ala, NP   Encounter Date: 05/14/2018  OT End of Session - 05/14/18 1510    Visit Number  1    Number of Visits  16    Date for OT Re-Evaluation  06/11/18    OT Start Time  1300    OT Stop Time  1430    OT Time Calculation (min)  90 min    Activity Tolerance  Patient tolerated treatment well    Behavior During Therapy  [facility] for tasks assessed/performed       Past Medical History:  Diagnosis Date  . Allergy   . Anxiety   . Arthritis   . Carpal tunnel syndrome of right wrist    RECURRENT  . Depression   . Dysphagia 09/02/2017  . Edema 09/02/2017  . Environmental allergies    dust mites, pollen, roaches and other insects, mold  . Headache    migraines  . Hereditary and idiopathic peripheral neuropathy 04/03/2016  . History of adenomatous polyp of colon   . History of gastric ulcer   . History of kidney stones   . Hypertension   . Pneumonia   . Sleep apnea    wears CPAP  . Wears glasses     Past Surgical History:  Procedure Laterality Date  . CARPAL TUNNEL RELEASE Right 2007  . CARPAL TUNNEL RELEASE Right 07/05/2015   Procedure: RIGHT HAND REVISION CARPAL TUNNEL RELEASE;  Surgeon: Iran Planas, MD;  Location: [place];  Service: Orthopedics;  Laterality: Right;  . CARPAL TUNNEL RELEASE Left 09/2016  . CERVICAL DISC ARTHROPLASTY  03/2015  . COLONOSCOPY W/ POLYPECTOMY  04-15-2014  . CYSTO/  RIGHT RETROGRADE PYELOGRAM/ URETEROSCOPY STONE EXTRACTION/  STENT PLACEMENT  06-07-2010  . LAPAROSCOPIC ASSISTED VAGINAL HYSTERECTOMY  04-25-2004   ovaries remain  . TUBAL LIGATION  1982    There were no vitals filed for this visit.  Subjective Assessment - 05/14/18 1507    Currently in  Pain?  Other (Comment) some pain at baseline with fibromyalgia dx        [facility] OT Assessment - 05/14/18 0001      Assessment   Medical Diagnosis  Major Depressive Diagnosis    Referring Provider  Ricky Ala, NP    Onset Date/Surgical Date  05/14/18      Balance Screen   Has the patient fallen in the past 6 months  No        OT assessment Diagnosis: Major Depressive disorder  Past medical history/referral information: Pt was referred to [facility] OT by Ricky Ala, NP after an inpatient stay from [facility] with a plan. Pt is often tangential and reports she feels she has some difficulty with her memory at times/sometimes foggy.  Living situation: Pt reports living with husband, just recently lost their home to lack of income.  ADLs/IADLs: Pt currently has difficulty engaging in ADL/IADLs 2/2 to physical (at baseline) and mental limitations, and relies on husband. Pt reports she would like to be more independent.  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 This has caused pt increased emotional distress.  Leisure: Pt reports previously liking to swim, no longer carries this out. Pt also enjoys reading.  Social  support: Husband, 2 sons  Struggles: Physical limitations at baseline, losing home, financial stressors, no longer being able to work. Pt reports having personal goal of working until 71, and distressed she could not carry this out.  OT goal: "I would like to find some meaningful volunteer work to fufill my need to work" [place] Summary of Client Scores:  FACILITATES [place] RESTRICTS PARTICIPATION IN OCCUPATION COMMENTS  ROLES              x  Distress over losing job  HABITS            x      Not satisfied  PERSONAL CAUSATION               x    VALUES             x     INTERESTS                x    SKILLS               x    SHORT TERM GOALS             x     LONG TERM GOALS               x    INTERPETATION OF PAST EXPERIENCES                 x     PHYSICAL ENVIRONMENT                  x    SOCIAL ENVIRONMENT                  x     READINESS FOR CHANGE                  x      Need for Occupational Therapy:  4 Shows positive occupational participation, no need for OT.   3 Need for minimal intervention/consultative participation  x 2 Need for OT intervention indicated to restore/improve participation   1 Need for extensive OT intervention indicated to improve participation.  Referral for follow up services also recommended.   Assessment:  Patient demonstrates behavior that  Inhibits participation in occupation.  Patient will benefit from occupational therapy intervention in order to improve time management, financial management, stress management, job readiness skills, social skills, and health management skills in preparation to return to full time community living and to be a productive community member.   Plan:  Patient will participate in skilled occupational therapy sessions individually or in a group setting to improve coping skills, psychosocial skills, and emotional skills required to return to prior level of function. Treatment will be 4-5 times per week for 3 weeks.                  OT Education - 05/14/18 1508    Education Details  education given on strategies to improve sleep hygiene    Person(s) Educated  Patient    Methods  Explanation;Handout    Comprehension  Verbalized understanding       OT Short Term Goals - 05/14/18 1511      OT SHORT TERM GOAL #1   Title  Pt will independently identify and implement volunteer opportunities to facilitate productivel comunity integration    Time  4  Period  Weeks    Status  New    [place] Date  06/11/18      OT SHORT TERM GOAL #2   Title  Pt will independently apply psychosocial and coping mechanisms to  daily activties in order to function independently and reintegrate into community dwelling    Time  4    Period  Weeks    Status  New    Target Date  06/11/18      OT SHORT TERM GOAL #3   Title  Pt will be educated on strategies to improve psychosocial skills needed to fully participate in all daily work, and leisure activities    Time  4    Period  Weeks    Status  New    Target Date  06/11/18       S: "I drink a lot of coffee, even in the evening" O: Pt educated on sleep hygiene as it pertains to daily life/routines this date. Education given on appropriate sleep routines, sleep disorders, detriments of too much/too little sleep with encouraged feedback of personal Experiences. Further education given on lifestyle and environmental alterations to facilitate healthy sleep. Sleep diary handout given to challenge pt to track current habits and identify area for change. Pt asked to identify one STG in relation to sleep hygiene to create better daily sleep habits.  A: Pt presents to group with positive affect, appropriately sharing and interacting with other group members. Education received in a positive manner to help improve sleep hygiene in daily life. Pt agreeable to use sleep diary this date. Pt identified goal of reducing caffeine in the evening and creating and maintaining a healthy bed time routine to improve sleep hygiene this date.  P: Pt provided with skills to increase sleep hygiene habits into daily routine. OT will continue to follow up with communication skills for successful implementation into daily life.         Plan - 05/14/18 1510    Occupational performance deficits (Please refer to evaluation for details):  ADL's;IADL's;Rest and Sleep;Work;Leisure;Social Participation    Rehab Potential  Good    OT Frequency  5x / week    OT Duration  4 weeks    OT Treatment/Interventions  Psychosocial skills training;Coping strategies training;Other (comment) community integration     Consulted and Agree with Plan of Care  Patient       Patient will benefit from skilled therapeutic intervention in order to improve the following deficits and impairments:  Decreased coping skills, Decreased psychosocial skills, Other (comment)(decreased ability to engage in BADL and integrate into community)  Visit Diagnosis: 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 MDD (major depressive disorder), recurrent episode, severe ([place]) 05/05/2018  . Vitamin D deficiency 11/05/2017  . Dysphagia 09/02/2017  . OSA (obstructive sleep apnea) 02/14/2017  . Multiple falls 09/04/2016  . Hereditary and idiopathic peripheral neuropathy 04/03/2016  . Exertional shortness of breath 01/26/2016  . Left leg pain 01/26/2016  . Trochanteric bursitis of both hips 12/19/2015  . Depression 08/28/2015  . Fibromyalgia 07/28/2015  . Right wrist pain 04/05/2015  . Routine general medical examination at a health care facility 03/01/2014  . HTN (hypertension) 01/12/2014  . Morbid obesity ([place]) 01/12/2014  . Pre-diabetes 01/12/2014  . Family history of lupus erythematosus 01/12/2014  . Fatigue  10/17/2013    Zenovia Jarred, MSOT, OTR/L  [place] 05/14/2018, 3:18 PM  [facility] PARTIAL HOSPITALIZATION PROGRAM [place], [place], [address] Phone: [phone]   Fax:  [phone]  Name: Linda Buckley MRN: [id number] Date of Birth: [birth year]

## 2018-05-14 NOTE — Psych (Signed)
   [facility] PHP THERAPIST PROGRESS NOTE  [name] [id number]  Session Time: 9:00 - 11:00  Participation Level: Active  Behavioral Response: CasualAlertDepressed  Type of Therapy: Group Therapy; psychotherapy  Treatment Goals addressed: Coping  Interventions: CBT, DBT, Solution Focused, Supportive and Reframing  Summary: . Clinician led check-in regarding current stressors and situation, and review of patient completed daily inventory. Clinician utilized active listening and empathetic response and validated patient emotions. Clinician facilitated processing group on pertinent issues.    Therapist Response: Linda Buckley is a 62 y.o. female who presents with depression symptoms. Patient arrived within time allowed and reports that she is feeling "great." Patient rates her mood at a 10 on a scale of 1-10 with 10 being great. Pt reports she was "worn down" after group yesterday as she is not used to being out of the house that much during the day. Pt reports pain issues throughout the night which resulted in poor sleep. Pt reports continuing to struggle with accepting she cannot work and what that means for her identity.  Patient engaged in discussion.       Session Time: 11:00 - 12:15  Participation Level: Active  Behavioral Response: CasualAlertDepressed  Type of Therapy: Group Therapy, Psychoeducation; Activity Therapy  Treatment Goals addressed: Coping  Interventions: Solution focused; Supportive; Reframing  Summary: Group participated in essential oils workshop discussing how essential oils can help address mood issues. Group learned different essential oil types and combinations that are meant to address mood management.   Therapist Response: Pt attended and participated workshop.           Session Time: 12:15 - 1:00  Participation Level: Active  Behavioral Response: CasualAlertDepressed  Type of Therapy: Group Therapy, Activity  Therapy  Treatment Goals addressed: Coping  Interventions: Systems analyst, Supportive  Summary:  Reflection Group: Patients encouraged to practice skills and interpersonal techniques or work on mindfulness and relaxation techniques. The importance of self-care and making skills part of a routine to increase usage were stressed   Therapist Response: Patient engaged and participated appropriately.        Session Time: 1:00 - 1:50   Participation Level: Active   Behavioral Response: CasualAlertDepressed   Type of Therapy: Group Therapy, OT   Treatment Goals addressed: Coping   Interventions: Psychosocial skills training, Supportive,    Summary:  Occupational Therapy group   Therapist Response: Patient engaged in group. See OT note.     Check Out 1:50 - 2:00 Summary:   1:50 -2:00 Clinician led check-out. Clinician assessed for immediate needs, medication compliance and efficacy, and safety concerns   Therapist Response:  At [place], patient rates her mood at a 8 on a scale of 1-10 with 10 being great. Patient reports she is still feeling in pain and will rest for the rest of the afternoon. Patient demonstrates some progress as evidenced by sharing ways she has put into practice topics discussed in group. Patient denies SI/HI/self-harm thoughts at the end of group.    Suicidal/Homicidal: Nowithout intent/plan   Plan: Pt will continue in PHP and work to decrease depression symptoms and increase ability to self manage symptoms as they arise.    Diagnosis: Severe episode of recurrent major depressive disorder, without psychotic features ([place]) [F33.2]    1. Severe episode of recurrent major depressive disorder, without psychotic features ([place])       [name], LCSW 05/14/2018

## 2018-05-15 ENCOUNTER — Other Ambulatory Visit (HOSPITAL_COMMUNITY): Payer: BLUE CROSS/BLUE SHIELD | Admitting: Occupational Therapy

## 2018-05-15 ENCOUNTER — Encounter (HOSPITAL_COMMUNITY): Payer: Self-pay | Admitting: Occupational Therapy

## 2018-05-15 ENCOUNTER — Other Ambulatory Visit (HOSPITAL_COMMUNITY): Payer: BLUE CROSS/BLUE SHIELD | Admitting: Licensed Clinical Social Worker

## 2018-05-15 ENCOUNTER — Encounter (HOSPITAL_COMMUNITY): Payer: Self-pay

## 2018-05-15 VITALS — BP 104/72 | HR 88 | Ht 66.0 in | Wt 265.0 lb

## 2018-05-15 DIAGNOSIS — F332 Major depressive disorder, recurrent severe without psychotic features: Secondary | ICD-10-CM

## 2018-05-15 DIAGNOSIS — F079 Unspecified personality and behavioral disorder due to known physiological condition: Secondary | ICD-10-CM

## 2018-05-15 DIAGNOSIS — R4589 Other symptoms and signs involving emotional state: Secondary | ICD-10-CM

## 2018-05-15 DIAGNOSIS — F331 Major depressive disorder, recurrent, moderate: Secondary | ICD-10-CM | POA: Diagnosis not present

## 2018-05-15 NOTE — Therapy (Signed)
[place] [place], [place], [address] Phone: [phone]   Fax:  [phone]  Occupational Therapy Treatment  Patient Details  Name: Linda Buckley MRN: [id number] Date of Birth: 10-15-1956 Referring Provider: Ricky Ala, NP   Encounter Date: 05/15/2018  OT End of Session - 05/15/18 1318    Visit Number  2    Number of Visits  16    Date for OT Re-Evaluation  06/11/18    Authorization Type  BCBS    OT Start Time  1100    OT Stop Time  1210    OT Time Calculation (min)  70 min    Activity Tolerance  Patient tolerated treatment well    Behavior During Therapy  [facility] for tasks assessed/performed       Past Medical History:  Diagnosis Date  . Allergy   . Anxiety   . Arthritis   . Carpal tunnel syndrome of right wrist    RECURRENT  . Depression   . Dysphagia 09/02/2017  . Edema 09/02/2017  . Environmental allergies    dust mites, pollen, roaches and other insects, mold  . Fibromyalgia 04/2016  . Headache    migraines  . Hereditary and idiopathic peripheral neuropathy 04/03/2016  . History of adenomatous polyp of colon   . History of gastric ulcer   . History of kidney stones   . Hypertension   . Pneumonia   . Sleep apnea    wears CPAP  . Wears glasses     Past Surgical History:  Procedure Laterality Date  . CARPAL TUNNEL RELEASE Right 2007  . CARPAL TUNNEL RELEASE Right 07/05/2015   Procedure: RIGHT HAND REVISION CARPAL TUNNEL RELEASE;  Surgeon: Iran Planas, MD;  Location: [facility];  Service: Orthopedics;  Laterality: Right;  . CARPAL TUNNEL RELEASE Left 09/2016  . CERVICAL DISC ARTHROPLASTY  03/2015  . COLONOSCOPY W/ POLYPECTOMY  04-15-2014  . CYSTO/  RIGHT RETROGRADE PYELOGRAM/ URETEROSCOPY STONE EXTRACTION/  STENT PLACEMENT  06-07-2010  . LAPAROSCOPIC ASSISTED VAGINAL HYSTERECTOMY  04-25-2004   ovaries remain  . TUBAL LIGATION  1982    There were no vitals filed for this  visit.  Subjective Assessment - 05/15/18 1317    Currently in Pain?  Other (Comment) baseline pain from fibromyalgia dx         S: "I need to be better about drinking water"   O: Education provided on health and wellness in the physical and mental aspect and how those connections positively affect the body/mind and the ability to appropriately engage in BADL/IADL/community. Definitions on mental and physical health given. Group discussion given on ways to facilitate positive mental health and physical health per pt current level. Self-esteem and awareness of emotions discussed to help further facilitate well rounded health and wellness. Pt brainstormed ways to increase physical activity per current level and comforts. Vision boards created to help pts positively depict the future, with goals and traits they find admirable/desirable within the self. Pt instructed to share with other group members thoughts/goals when creating vision boards.  A: Pt presents with appropriate affect, sharing and engaging with other group members. Pt received education on the importance of a healthy physical and mental connections. Pt participated in group discussion on ways to facilitate a stronger physical and mental connection to better support a balance between body/mind to appropriately engage in community dwelling. Pt identified swimming as her preferred physical activity and pain reliever from baseline pain with  a fibromyalgia diagnosis.. Pt created vision board and shared her positive aspirations and pictures with other group members.  P: Pt provided with education on importance of health and wellness to appropriately engage in community. OT will continue to follow up for successful implementation into daily life                  OT Education - 05/15/18 1318    Education Details  education given on physical health and wellness    Person(s) Educated  Patient    Methods  Explanation;Handout     Comprehension  Verbalized understanding       OT Short Term Goals - 05/14/18 1511      OT SHORT TERM GOAL #1   Title  Pt will independently identify and implement volunteer opportunities to facilitate productivel comunity integration    Time  4    Period  Weeks    Status  New    Target Date  06/11/18      OT SHORT TERM GOAL #2   Title  Pt will independently apply psychosocial and coping mechanisms to daily activties in order to function independently and reintegrate into community dwelling    Time  4    Period  Weeks    Status  New    Target Date  06/11/18      OT SHORT TERM GOAL #3   Title  Pt will be educated on strategies to improve psychosocial skills needed to fully participate in all daily work, and leisure activities    Time  4    Period  Weeks    Status  New    Target Date  06/11/18               Plan - 05/15/18 1319    Occupational performance deficits (Please refer to evaluation for details):  ADL's;IADL's;Rest and Sleep;Work;Leisure;Social Participation       Patient will benefit from skilled therapeutic intervention in order to improve the following deficits and impairments:  Decreased coping skills, Decreased psychosocial skills, Other (comment)(decreased ability to engage in BADL and integrate into community)  Visit Diagnosis: Personality and behavioral disorder due to known physiological condition  Difficulty coping  Severe episode of recurrent major depressive disorder, without psychotic features [facility])    Problem List Patient Active Problem List   Diagnosis Date Noted  . MDD (major depressive disorder), recurrent episode, severe ([place]) 05/05/2018  . Vitamin D deficiency 11/05/2017  . Dysphagia 09/02/2017  . OSA (obstructive sleep apnea) 02/14/2017  . Multiple falls 09/04/2016  . Hereditary and idiopathic peripheral neuropathy 04/03/2016  . Exertional shortness of breath 01/26/2016  . Left leg pain 01/26/2016  . Trochanteric bursitis of  both hips 12/19/2015  . Depression 08/28/2015  . Fibromyalgia 07/28/2015  . Right wrist pain 04/05/2015  . Routine general medical examination at a health care facility 03/01/2014  . HTN (hypertension) 01/12/2014  . Morbid obesity ([place]) 01/12/2014  . Pre-diabetes 01/12/2014  . Family history of lupus erythematosus 01/12/2014  . Fatigue 10/17/2013   Zenovia Jarred, MSOT, OTR/L  [place] 05/15/2018, 1:20 PM  [facility] HOSPITALIZATION PROGRAM [place], [place], [address] Phone: [phone]   Fax:  [phone]  Name: TEONNA COONAN MRN: [id number] Date of Birth: 06-28-1956

## 2018-05-15 NOTE — Progress Notes (Signed)
Patient presented with restricted affect, depressed mood and admitted she was in some pain today primarily due to reported fluid increase and Fibromyalgia in her neck, back and feet.  Patient reported she was at 258 pounds the previous Friday, 1 week ago, but now up to 265 pounds.  Informed Ricky Ala, NP as patient is on Lasix 20 mg a day and reports she had taken it today.  Patient not short of breath and some minimal edema in both feet.  Patient encouraged to follow up with PCP/primary cardiologist and denied any suicidal or homicidal ideations at this time, no auditory or visual hallucinations and no problems with current medications as verified with [place] patient was still taking Wellbutrin XL 300 mg, one a day.  Patient reported sleeping better the previous night using her C-pap machine, more than 9 hours and states she is working her way off Trazodone and only taking every other day.  Patient rated her current level of depression a 9, anxiety a 9 and hopelessness a 0 on a scale of 0-10 with 0 being none and 10 the worst she could manage.  Patient stated that even though she misses work and is depressed with her current health issues, she does feel PHP is being helpful and denies any plans or intent to harm self or others.  Patient to meet with NP today to review all medications and will contact this nurse or PHP staff if any worsening of symptoms or problems with medications.

## 2018-05-18 ENCOUNTER — Other Ambulatory Visit (HOSPITAL_COMMUNITY): Payer: BLUE CROSS/BLUE SHIELD | Attending: Psychiatry | Admitting: Licensed Clinical Social Worker

## 2018-05-18 ENCOUNTER — Other Ambulatory Visit (HOSPITAL_COMMUNITY): Payer: BLUE CROSS/BLUE SHIELD | Admitting: Occupational Therapy

## 2018-05-18 ENCOUNTER — Telehealth: Payer: Self-pay | Admitting: Neurology

## 2018-05-18 ENCOUNTER — Encounter (HOSPITAL_COMMUNITY): Payer: Self-pay | Admitting: Occupational Therapy

## 2018-05-18 DIAGNOSIS — I10 Essential (primary) hypertension: Secondary | ICD-10-CM | POA: Diagnosis not present

## 2018-05-18 DIAGNOSIS — G5601 Carpal tunnel syndrome, right upper limb: Secondary | ICD-10-CM | POA: Insufficient documentation

## 2018-05-18 DIAGNOSIS — G4730 Sleep apnea, unspecified: Secondary | ICD-10-CM | POA: Diagnosis not present

## 2018-05-18 DIAGNOSIS — F419 Anxiety disorder, unspecified: Secondary | ICD-10-CM | POA: Diagnosis not present

## 2018-05-18 DIAGNOSIS — R4589 Other symptoms and signs involving emotional state: Secondary | ICD-10-CM

## 2018-05-18 DIAGNOSIS — F332 Major depressive disorder, recurrent severe without psychotic features: Secondary | ICD-10-CM | POA: Diagnosis present

## 2018-05-18 DIAGNOSIS — F079 Unspecified personality and behavioral disorder due to known physiological condition: Secondary | ICD-10-CM

## 2018-05-18 DIAGNOSIS — Z79899 Other long term (current) drug therapy: Secondary | ICD-10-CM | POA: Diagnosis not present

## 2018-05-18 NOTE — Telephone Encounter (Signed)
Pt requesting a call to discuss changing the air pressure for her CPAP. Pt states its currently at 6 please call to advise

## 2018-05-18 NOTE — Therapy (Addendum)
[place], [place], [address] Phone: [phone]   Fax:  [phone]  Occupational Therapy Treatment  Patient Details  Name: Linda Buckley MRN: [id number] Date of Birth: Feb 07, 1956 Referring Provider: Ricky Ala, NP   Encounter Date: 05/18/2018  OT End of Session - 05/18/18 1131    Visit Number  3    Number of Visits  16    Date for OT Re-Evaluation  06/11/18    Authorization Type  BCBS    OT Start Time  1324    OT Stop Time  1105    OT Time Calculation (min)  50 min    Activity Tolerance  Patient tolerated treatment well    Behavior During Therapy  [facility] for tasks assessed/performed       Past Medical History:  Diagnosis Date  . Allergy   . Anxiety   . Arthritis   . Carpal tunnel syndrome of right wrist    RECURRENT  . Depression   . Dysphagia 09/02/2017  . Edema 09/02/2017  . Environmental allergies    dust mites, pollen, roaches and other insects, mold  . Fibromyalgia 04/2016  . Headache    migraines  . Hereditary and idiopathic peripheral neuropathy 04/03/2016  . History of adenomatous polyp of colon   . History of gastric ulcer   . History of kidney stones   . Hypertension   . Pneumonia   . Sleep apnea    wears CPAP  . Wears glasses     Past Surgical History:  Procedure Laterality Date  . CARPAL TUNNEL RELEASE Right 2007  . CARPAL TUNNEL RELEASE Right 07/05/2015   Procedure: RIGHT HAND REVISION CARPAL TUNNEL RELEASE;  Surgeon: Iran Planas, MD;  Location: [facility];  Service: Orthopedics;  Laterality: Right;  . CARPAL TUNNEL RELEASE Left 09/2016  . CERVICAL DISC ARTHROPLASTY  03/2015  . COLONOSCOPY W/ POLYPECTOMY  04-15-2014  . CYSTO/  RIGHT RETROGRADE PYELOGRAM/ URETEROSCOPY STONE EXTRACTION/  STENT PLACEMENT  06-07-2010  . LAPAROSCOPIC ASSISTED VAGINAL HYSTERECTOMY  04-25-2004   ovaries remain  . TUBAL LIGATION  1982    There were no vitals filed for this  visit.  Subjective Assessment - 05/18/18 1129    Currently in Pain?  Other (Comment) baseline chronic pain (fibromyalgia diagnosis)       S: "I really like my loyalty qualities within my family"   O: Education given on definition and importance of positive self-esteem in daily life and relationships with focus on using positive self-talk. Further education given on the relationship between self-esteem and mental illness and both high and low self-esteem factors. Worksheet given for pt to identify a positive trait about themselves with each letter of the alphabet to use as reference for future times of need and to gain insight on numerous positive qualities.?Additional self esteem worksheet given to identify positive aspects in every area of life (work, leisure, relationships, etc.). Pt encouraged to display worksheets in easily accessible area to serve as visual reminder in daily routine. Pt asked to identify 3 things they love about themselves this date.   A: Pt was actively engaged and participatory throughout entirety of group this date. Pt completed self-esteem alphabet activity with minimal verbal cues. Pt also provided support to other group members to help brainstorm ideas, helping to highlight positive qualities she saw in other group members. Pt completed additional self esteem worksheet. Pt 3 things she likes about herself include: being a people person,  an entertainer for her family, and her family values.  P: Pt provided with self-esteem boosting skills to implement into a variety of daily activities/routines. OT will continue to follow up for successful implementation into daily life.                     OT Education - 05/18/18 1130    Education Details  education given on self esteem and its importance in daily activities/routine    Person(s) Educated  Patient    Methods  Explanation;Handout    Comprehension  Verbalized understanding       OT Short Term Goals -  05/14/18 1511      OT SHORT TERM GOAL #1   Title  Pt will independently identify and implement volunteer opportunities to facilitate productivel comunity integration    Time  4    Period  Weeks    Status  New    Target Date  06/11/18      OT SHORT TERM GOAL #2   Title  Pt will independently apply psychosocial and coping mechanisms to daily activties in order to function independently and reintegrate into community dwelling    Time  4    Period  Weeks    Status  New    Target Date  06/11/18      OT SHORT TERM GOAL #3   Title  Pt will be educated on strategies to improve psychosocial skills needed to fully participate in all daily work, and leisure activities    Time  4    Period  Weeks    Status  New    Target Date  06/11/18               Plan - 05/18/18 1131    Occupational performance deficits (Please refer to evaluation for details):  ADL's;IADL's;Rest and Sleep;Work;Leisure;Social Participation       Patient will benefit from skilled therapeutic intervention in order to improve the following deficits and impairments:  Decreased coping skills, Decreased psychosocial skills, Other (comment)(decreased ability to engage in BADL and integrate into community)  Visit Diagnosis: Personality and behavioral disorder due to known physiological condition  Difficulty coping  Severe episode of recurrent major depressive disorder, without psychotic features [facility])    Problem List Patient Active Problem List   Diagnosis Date Noted  . MDD (major depressive disorder), recurrent episode, severe ([place]) 05/05/2018  . Vitamin D deficiency 11/05/2017  . Dysphagia 09/02/2017  . OSA (obstructive sleep apnea) 02/14/2017  . Multiple falls 09/04/2016  . Hereditary and idiopathic peripheral neuropathy 04/03/2016  . Exertional shortness of breath 01/26/2016  . Left leg pain 01/26/2016  . Trochanteric bursitis of both hips 12/19/2015  . Depression 08/28/2015  . Fibromyalgia 07/28/2015   . Right wrist pain 04/05/2015  . Routine general medical examination at a health care facility 03/01/2014  . HTN (hypertension) 01/12/2014  . Morbid obesity ([place]) 01/12/2014  . Pre-diabetes 01/12/2014  . Family history of lupus erythematosus 01/12/2014  . Fatigue 10/17/2013   Zenovia Jarred, MSOT, OTR/L  [place] 05/18/2018, 11:32 AM  [place], [place], [address] Phone: [phone]   Fax:  [phone]  Name: Linda Buckley MRN: [id number] Date of Birth: [birth year]

## 2018-05-18 NOTE — Telephone Encounter (Signed)
Called the patient back. No answer. I pulled her data, the patient has not used the machine since April. We would need the patient to resume using the machine at the current setting at least 7-14 days so we can assess if its treating her apnea well before we could determine if we could make a change to the patients CPAP pressure.   LVM for the pt to call back

## 2018-05-19 ENCOUNTER — Other Ambulatory Visit (HOSPITAL_COMMUNITY): Payer: Self-pay

## 2018-05-19 ENCOUNTER — Other Ambulatory Visit: Payer: Self-pay | Admitting: Neurology

## 2018-05-19 ENCOUNTER — Ambulatory Visit (HOSPITAL_COMMUNITY): Payer: Self-pay

## 2018-05-19 DIAGNOSIS — G4733 Obstructive sleep apnea (adult) (pediatric): Secondary | ICD-10-CM

## 2018-05-19 DIAGNOSIS — Z9989 Dependence on other enabling machines and devices: Principal | ICD-10-CM

## 2018-05-19 NOTE — Telephone Encounter (Signed)
Dr Dohmeier recommended changing the pressure on her current machine to auto 5-12 cm of water pressure. Order has been sent to [facility]

## 2018-05-19 NOTE — Telephone Encounter (Signed)
Spoke with the patient and she states that she uses the machine every night. Pt states that she was in [employer] today and someone did a download of her machine. I informed her that I would reach out to [employer] to see if they could send me that download. I spoke with Jeneen Rinks from [facility] and the patient was there and informed them also that she uses the machine every night and they did take a card and get a download and there was nothing since October. I was also informed that when you look in [employer] and see the red that means the machine is plugged in but if there is no data then the patient isn't using it. I will review this information with Dr Brett Fairy and see her thoughts. Pt is feeling like she can't catch her breath and feels the pressure needs to be increased. I dont see any data to assess this concern at this time. [employer] attempted to also download the machine in the office with using a card and there was no data since April.

## 2018-05-19 NOTE — Psych (Signed)
   [facility] BH PHP THERAPIST PROGRESS NOTE  Linda Buckley [id number]  Session Time: 9:00 - 10:15  Participation Level: Active  Behavioral Response: CasualAlertDepressed  Type of Therapy: Group Therapy; psychotherapy  Treatment Goals addressed: Coping  Interventions: CBT, DBT, Solution Focused, Supportive and Reframing  Summary: . Clinician led check-in regarding current stressors and situation, and review of patient completed daily inventory. Clinician utilized active listening and empathetic response and validated patient emotions. Clinician facilitated processing group on pertinent issues.    Therapist Response: Linda Buckley is a 62 y.o. female who presents with depression symptoms. Patient arrived within time allowed and reports that she is feeling "sore." Patient rates her mood at a 8 on a scale of 1-10 with 10 being great. Pt reports her pain is escalated today and her feet are swollen. Pt reports she is still getting accustomed to being out of the house and active. Pt reports mentally she is feeling "some better" and is "lookng brighter." Pt reports continuing to struggle with acceptance.  Patient engaged in discussion.       Session Time: 10:15 - 11:00  Participation Level: Active  Behavioral Response: CasualAlertDepressed  Type of Therapy: Group Therapy, Psychotherapy  Treatment Goals addressed: Coping  Interventions: CBT, Solution focused, Supportive, Reframing  Summary:  Clinician continued topic of distress tolerance skills and introduced Self Soothe skill. Group members discussed ways they would utilize self soothe in their everyday life.      Therapist Response: Pt reports understanding of self soothe skills and reports the touch sense is most effective for her.      Session Time: 11:00 -12:00   Participation Level: Active   Behavioral Response: CasualAlertDepressed   Type of Therapy: Group Therapy, OT   Treatment Goals addressed: Coping    Interventions: Psychosocial skills training, Supportive,    Summary:  Occupational Therapy group   Therapist Response: Patient engaged in group. See OT note.         Session Time: 12:00- 1:00  Participation Level: Active  Behavioral Response: CasualAlertDepressed  Type of Therapy: Group Therapy, Psychoeducation; Psychotherapy  Treatment Goals addressed: Coping  Interventions: CBT; Solution focused; Supportive; Reframing  Summary: 12:00 - 12:50 Group watched "100 days of Rejection" TedTalk and discussed the topic of rejection and how that plays out in our lives. 12:50 -1:00 Clinician led check-out. Clinician assessed for immediate needs, medication compliance and efficacy, and safety concerns   Therapist Response:  Pt engaged on discussion regarding rejection. Pt is able to identify ways in which the fear of rejection has held her back.  [name], patient rates her mood at a 8 on a scale of 1-10 with 10 being great. Patient reports plans of relaxing this weekend.  Patient demonstrates some progress as evidenced increased sleep last night. Patient denies SI/HI/self-harm at the end of group.   Suicidal/Homicidal: Nowithout intent/plan   Plan: Pt will continue in PHP and work to decrease depression symptoms and increase ability to self manage symptoms as they arise.    Diagnosis: Severe episode of recurrent major depressive disorder, without psychotic features ([place]) [F33.2]    1. Severe episode of recurrent major depressive disorder, without psychotic features ([place])       [name], LCSW 05/19/2018

## 2018-05-19 NOTE — Psych (Signed)
[facility] PHP THERAPIST PROGRESS NOTE  [name] [id number]  Session Time: 9:00 - 10:15  Participation Level: Active  Behavioral Response: CasualAlertDepressed  Type of Therapy: Group Therapy; psychotherapy  Treatment Goals addressed: Coping  Interventions: CBT, DBT, Solution Focused, Supportive and Reframing  Summary: . Clinician led check-in regarding current stressors and situation, and review of patient completed daily inventory. Clinician utilized active listening and empathetic response and validated patient emotions. Clinician facilitated processing group on pertinent issues.    Therapist Response: Linda Buckley is a 62 y.o. female who presents with depression symptoms. Patient arrived within time allowed and reports that she is feeling "glad to be here." Patient rates her mood at a 10 on a scale of 1-10 with 10 being great. Pt reports continued pain issues and "not feeling a 10 in my body."  Pt states she had a "melt down" yesterday and was able to manage it by reading the bible.  Pt shares she slept 8+ hours. Pt reports wanting to work on Radiographer, therapeutic.  Patient engaged in discussion.      Session Time: 10:15 - 11:00   Participation Level: Active   Behavioral Response: CasualAlertDepressed   Type of Therapy: Group Therapy, OT   Treatment Goals addressed: Coping   Interventions: Psychosocial skills training, Supportive,    Summary:  Occupational Therapy group   Therapist Response: Patient engaged in group. See OT note.       Session Time: 11:00 - 12:00   Participation Level: Minimal   Behavioral Response: CasualAlertDepressed   Type of Therapy: Group Therapy, psychoeducation   Treatment Goals addressed: Coping; increasing independence   Interventions: Financial education, Supportive   Summary:  Financial management group: Group is educated on how to manage and maintain basic finances in a smart and healthy manner. Group discussed how  effectively managing finances can help or hinder mental health.     Therapist Response: Patient minimally engaged in group.      Session Time: 12:00 - 12:45  Participation Level: Active  Behavioral Response: CasualAlertDepressed  Type of Therapy: Group Therapy, Activity Therapy  Treatment Goals addressed: Coping  Interventions: Systems analyst, Supportive  Summary:  Reflection Group: Patients encouraged to practice skills and interpersonal techniques or work on mindfulness and relaxation techniques. The importance of self-care and making skills part of a routine to increase usage were stressed   Therapist Response: Patient engaged and participated appropriately.       Session Time: 12:45- 2:00  Participation Level: Active  Behavioral Response: CasualAlertDepressed  Type of Therapy: Group Therapy, Psychoeducation; Psychotherapy  Treatment Goals addressed: Coping  Interventions: CBT; Solution focused; Supportive; Reframing  Summary: 12:45 - 1:50: Clinician introduced topic of cognitive distortions. Linda Buckley educated on what cognitive distortions are and how they affect [name]. Linda Buckley introduced "Catch, Challenge, Change" and group reviewed cognitive distortion handout and came up with examples to work on "catch." 1:50 -2:00 Clinician led check-out. Clinician assessed for immediate needs, medication compliance and efficacy, and safety concerns   Therapist Response: Patient engaged activity and discussion. Pt was able to identify real life examples of cognitive distortions discussed.  At [name], patient rates her mood at a 10 on a scale of 1-10 with 10 being great. Patient reports plans of switching out her closets this afternoon Patient demonstrates some progress as evidenced by recognizing ways to manage extreme emotion. Patient denies SI/HI/self-harm at the end of group.    Suicidal/Homicidal: Nowithout intent/plan   Plan: Pt will continue in PHP  and work to decrease  depression symptoms and increase ability to self manage symptoms as they arise.    Diagnosis: Severe episode of recurrent major depressive disorder, without psychotic features ([place]) [F33.2]    1. Severe episode of recurrent major depressive disorder, without psychotic features ([place])       Linda Glass, LCSW 05/19/2018

## 2018-05-20 ENCOUNTER — Other Ambulatory Visit (HOSPITAL_COMMUNITY): Payer: BLUE CROSS/BLUE SHIELD | Admitting: Licensed Clinical Social Worker

## 2018-05-20 ENCOUNTER — Encounter (HOSPITAL_COMMUNITY): Payer: Self-pay | Admitting: Family

## 2018-05-20 DIAGNOSIS — F332 Major depressive disorder, recurrent severe without psychotic features: Secondary | ICD-10-CM | POA: Diagnosis not present

## 2018-05-20 MED ORDER — BUPROPION HCL ER (XL) 300 MG PO TB24: 300.0000 mg | ORAL_TABLET | Freq: Every day | ORAL | 0 refills | Status: DC

## 2018-05-20 MED ORDER — TRAZODONE HCL 50 MG PO TABS: 50.0000 mg | ORAL_TABLET | Freq: Every evening | ORAL | 0 refills | Status: DC | PRN

## 2018-05-20 MED ORDER — DULOXETINE HCL 60 MG PO CPEP: 60.0000 mg | ORAL_CAPSULE | Freq: Two times a day (BID) | ORAL | 0 refills | Status: DC

## 2018-05-20 NOTE — Progress Notes (Signed)
GROUP NOTE - spiritual care group 05/20/2018 11:00 - 12:00 ? Facilitated by Simone Curia, MDiv, BCC   Group focused on topic of strength. ?Group members reflected on what thoughts and feelings emerge when they hear this topic. ?They then engaged in therapeutic art activity. ?Reflected on The topic of strength and represented what strength had been to them in their lives (images and patterns given) and what they saw as helpful in their life now. ?What they needed / wanted. ?Engaged in facilitated sharing of insights from activity.  Activity drew on narrative framework   Linda Buckley was present throughout group.  She engaged voluntarily in group discussion and group activity.  Linda Buckley spoke of the death of her brother 16 years prior and how she has had to care for others since that time.  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  Reflected on cultural influence on ideas of strength - relating that she was raised to be a "strong Black woman" and not depend on anyone else.

## 2018-05-20 NOTE — Telephone Encounter (Signed)
I called pt. I explained that Dr. Brett Fairy has changed pt's pressure on her machine to 5-12 cm H2O and we have sent the order to [place]. Pt reports that she used the cpap again last night but when she woke up this morning, it still revealed no data. She is asking that I ask [facility] to call her to discuss this. Pt would like to keep her 06/02/18 appt with Jinny Blossom, NP at this time. Pt verbalized understanding of the above recommendations and of the appt date and time. I have reached out to [place].

## 2018-05-20 NOTE — Telephone Encounter (Signed)
I spoke to [facility]. Based on their data, it appears that pt does not have the cpap plugged in the for the last 2 days. They have checked the machine out previously, and it appears to be functioning fine. They recommended that we either perform a PAP nap with the pt using her cpap, and see if we can get data from that PAP nap, or they can ask pt to come to their office, use the machine for 30-45 mins, and try and download the machine then to see if data was recorded. I spoke with our sleep lab manager, and we would have to keep the pt for 4 hours to bill for a PAP nap. It would be best if [facility] could have the pt come to their office. I will ask [facility] to reach out to the pt and schedule a meeting with her in their office.

## 2018-05-20 NOTE — Progress Notes (Signed)
BH MD/PA/NP PHP Progress Note  05/20/2018 12:34 PM Linda Buckley  MRN:  [id number]  Evaluation. Linda Buckley seen attending daily group session.  Reports overall her mood has improved since her admission however continues to struggle with pain and mild depressive symptoms.  States that she is learning different coping skills as she has been  including her family in her daily progress. States she is usually guarded and closed off with her feelings.  [name] reports she has been able to open up with expressing her feelings to her husband.  Patient is prescribed Wellbutrin 300 mg Cymbalta 60 mg trazodone 50 mg .  Reports taking as prescribed and tolerating well. Was reported pateient had expressed concerns with" jitteriness" however reports symptoms has resolved and states she doesn't t think those feeling are from her medications.  Patient to keep follow-up appointment with cardiologist and primary care provider. Denies suicidal or homicidal ideations during this assessment.  Continues to deny any intent or plan.  patient continues to room ruminate with chronic pain. Support encouragement and reassurance was provided.  History:Per assessment notes:Pt presents to [facility] for [facility] per inpt. Pt reports she went inpt due to increased SI with plan of driving over/into a bridge. Pt shares she has dealt with depression for 40+ years but has always been able to "throw myself into things to distract me" including children, work, marriage. Pt reports she has been unable to work since 2015-03-24 due to physical issues and that has made her depression worse. Pt shares her older brother died in 03-23-01 and still has a lot of grief about this. Pt reports she was renting-to-own a home and was 1 year away from taking over the mrtg when the owner gave them 45 days notice to move out in 09/2017.   Visit Diagnosis:    ICD-10-CM   1. Severe episode of recurrent major depressive disorder, without psychotic features ([place]) F33.2     Past  Psychiatric History:   Past Medical History:  Past Medical History:  Diagnosis Date  . Allergy   . Anxiety   . Arthritis   . Carpal tunnel syndrome of right wrist    RECURRENT  . Depression   . Dysphagia 09/02/2017  . Edema 09/02/2017  . Environmental allergies    dust mites, pollen, roaches and other insects, mold  . Fibromyalgia 04/2016  . Headache    migraines  . Hereditary and idiopathic peripheral neuropathy 04/03/2016  . History of adenomatous polyp of colon   . History of gastric ulcer   . History of kidney stones   . Hypertension   . Pneumonia   . Sleep apnea    wears CPAP  . Wears glasses     Past Surgical History:  Procedure Laterality Date  . CARPAL TUNNEL RELEASE Right March 23, 2006  . CARPAL TUNNEL RELEASE Right 07/05/2015   Procedure: RIGHT HAND REVISION CARPAL TUNNEL RELEASE;  Surgeon: Iran Planas, MD;  Location: [facility];  Service: Orthopedics;  Laterality: Right;  . CARPAL TUNNEL RELEASE Left 09/2016  . CERVICAL DISC ARTHROPLASTY  03/2015  . COLONOSCOPY W/ POLYPECTOMY  04-15-2014  . CYSTO/  RIGHT RETROGRADE PYELOGRAM/ URETEROSCOPY STONE EXTRACTION/  STENT PLACEMENT  06-07-2010  . LAPAROSCOPIC ASSISTED VAGINAL HYSTERECTOMY  04-25-2004   ovaries remain  . TUBAL LIGATION  1982    Family Psychiatric History:   Family History:  Family History  Problem Relation Age of Onset  . Asthma Father   . Diabetes Father   . Arthritis Mother   .  Hypertension Mother   . Diabetes Mother   . Cancer Mother   . Stomach cancer Mother   . Diabetes Brother   . Diabetes Brother   . Asthma Son   . Alcohol abuse Son   . Anxiety disorder Son   . Schizophrenia Son   . Cancer Maternal Grandfather        colon  . Colon cancer Maternal Grandfather   . Cancer Maternal Aunt        breast  . Cancer Maternal Uncle        lung  . Colon cancer Maternal Uncle   . Cancer Maternal Grandmother        ovarian  . Diabetes Son     Social History:  Social History   Socioeconomic History   . Marital status: Married    Spouse name: Not on file  . Number of children: 2  . Years of education: 2 yrs college  . Highest education level: Not on file  Occupational History  . 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 Financial resource strain: Very hard  . Food insecurity:    Worry: Often true    Inability: Often true  . Transportation needs:    Medical: No    Non-medical: No  Tobacco Use  . Smoking status: Never Smoker  . Smokeless tobacco: Never Used  Substance and Sexual Activity  . Alcohol use: Not Currently    Comment: 2 glasses of wine weekly - reports none in 2 years  . Drug use: No    Comment: prescription for klonopin and oxy IR  . Sexual activity: Yes    Partners: Male    Birth control/protection: None    Comment: partial hysterectomy  Lifestyle  . Physical activity:    Days per week: 0 days    Minutes per session: 0 min  . Stress: Rather much  Relationships  . Social connections:    Talks on phone: More than three times a week    Gets together: Once a week    Attends religious service: More than 4 times per year    Active member of club or organization: Yes    Attends meetings of clubs or organizations: 1 to 4 times per year    Relationship status: Married  Other Topics Concern  . Not on file  Social History Narrative   Lives at home w/ her husband   Right-handed   Drinks 2 cups of coffee weekly    3 bottle water per day    Allergies:  Allergies  Allergen Reactions  . Aspirin Swelling    Sweating/ swelling of hands and face   . Bee Venom Swelling    Swelling at site     Metabolic Disorder Labs: Lab Results  Component Value Date   HGBA1C 6.0 11/05/2017   No results found for: PROLACTIN Lab Results  Component Value Date   CHOL 169 11/05/2017   TRIG 85.0 11/05/2017   HDL 49.40 11/05/2017   CHOLHDL 3 11/05/2017   VLDL 17.0 11/05/2017   LDLCALC 103 (H) 11/05/2017   LDLCALC 94 10/28/2016   Lab Results   Component Value Date   TSH 1.89 11/05/2017   TSH 0.75 07/03/2017    Therapeutic Level Labs: No results found for: LITHIUM No results found for: VALPROATE No components found for:  CBMZ  Current Medications: Current Outpatient Medications  Medication Sig Dispense Refill  . beclomethasone (QVAR REDIHALER) 80 MCG/ACT inhaler Inhale  1 puff into the lungs 2 (two) times daily. 10.6 g 3  . buPROPion (WELLBUTRIN XL) 300 MG 24 hr tablet Take 1 tablet (300 mg total) by mouth daily. 30 tablet 0  . cetirizine (ZYRTEC) 10 MG tablet Take 1 tablet (10 mg total) by mouth daily. 30 tablet 11  . Cholecalciferol (VITAMIN D PO) Take 1 tablet by mouth daily. 1000units    . clonazePAM (KLONOPIN) 0.5 MG tablet TAKE 1 TABLET BY MOUTH AT BEDTIME (Patient not taking: No sig reported) 30 tablet 0  . diclofenac sodium (VOLTAREN) 1 % GEL APPLY 4 GRAMS EXTERNALLY TO THE AFFECTED AREA FOUR TIMES DAILY. 100 g 0  . DULoxetine (CYMBALTA) 60 MG capsule Take 1 capsule (60 mg total) by mouth 2 (two) times daily. 60 capsule 0  . EPINEPHrine (EPIPEN) 0.3 mg/0.3 mL SOAJ injection Inject 0.3 mg into the muscle once.    . furosemide (LASIX) 20 MG tablet TAKE 1 TABLET(20 MG) BY MOUTH DAILY 30 tablet 3  . losartan-hydrochlorothiazide (HYZAAR) 50-12.5 MG tablet TAKE 1 TABLET BY MOUTH EVERY DAY 90 tablet 0  . Oxycodone HCl 10 MG TABS TK 1 T PO Q 8 H PRN P  0  . pantoprazole (PROTONIX) 40 MG tablet Take 1 tablet (40 mg total) by mouth daily. 30 tablet 0  . pregabalin (LYRICA) 200 MG capsule Take 1 capsule (200 mg total) by mouth 2 (two) times daily. 180 capsule 1  . traZODone (DESYREL) 50 MG tablet Take 1 tablet (50 mg total) by mouth at bedtime as needed for sleep. 30 tablet 0  . VENTOLIN HFA 108 (90 Base) MCG/ACT inhaler INHALE 2 PUFFS BY MOUTH EVERY 4 HOURS AS NEEDED FOR SHORTNESS OF BREATH 36 g 0  . vitamin E 1000 UNIT capsule Take by mouth.     No current facility-administered medications for this visit.       Musculoskeletal: Strength & Muscle Tone: within normal limits Gait & Station: unsteady ambulates  with cane Patient leans: N/A  Psychiatric Specialty Exam: ROS  There were no vitals taken for this visit.There is no height or weight on file to calculate BMI.  General Appearance: Casual and Guarded  Eye Contact:  Fair  Speech:  Clear and Coherent  Volume:  Normal  Mood:  Depressed mood is improving   Affect:  Congruent  Thought Process:  Coherent  Orientation:  Full (Time, Place, and Person)  Thought Content: Hallucinations: None and Rumination   Suicidal Thoughts:  No  Homicidal Thoughts:  No  Memory:  Immediate;   Fair Recent;   Fair Remote;   Fair  Judgement:  Fair  Insight:  Fair  Psychomotor Activity:  unsteady/ patient utilizes a cane for ambulation assistance  Concentration:  Concentration: Fair  Recall:  [employer] of Knowledge: Fair  Language: Good  Akathisia:  No  Handed:  Right  AIMS (if indicated):   Assets:  Communication Skills Desire for Improvement Financial Resources/Insurance Physical Health Resilience Social Support  ADL's:  Intact  Cognition: WNL  Sleep:  Fair   Screenings: AIMS     Admission (Discharged) from 05/04/2018 in [facility]  AIMS Total Score  0    AUDIT     Admission (Discharged) from 05/04/2018 in [facility]  Alcohol Use Disorder Identification Test Final Score (AUDIT)  1    GAD-7     Counselor from 05/14/2018 in [place]  Total GAD-7 Score  14  PHQ2-9     Counselor from 05/14/2018 in [facility] Office Visit from 05/04/2018 in [facility] Office Visit from 11/05/2017 in [facility] Office Visit from 09/08/2017 in [facility] Office Visit from 08/08/2017 in [place]  PHQ-2 Total Score  6  2  0  0  0  PHQ-9 Total Score  24  12  0  0  0       Assessment and Plan:   Continue partial hospitalization program (PHP) Discussed patient to follow-up with primary care provider and cardiology    MDD:  Continue Cymbalta 60 mg p.o. twice daily  Continue trazodone 50 mg p.o. Nightly    Continue Wellbutrin 300 p.o. Daily medications was refilled  Treatment plan was reviewed and agreed upon by NP T. Bobby Rumpf and patient Linda Buckley continue need for group services  [place], NP 05/20/2018, 12:34 PM

## 2018-05-21 ENCOUNTER — Other Ambulatory Visit (HOSPITAL_COMMUNITY): Payer: BLUE CROSS/BLUE SHIELD | Admitting: Licensed Clinical Social Worker

## 2018-05-21 ENCOUNTER — Encounter (HOSPITAL_COMMUNITY): Payer: Self-pay | Admitting: Occupational Therapy

## 2018-05-21 ENCOUNTER — Encounter (HOSPITAL_COMMUNITY): Payer: Self-pay

## 2018-05-21 ENCOUNTER — Other Ambulatory Visit (HOSPITAL_COMMUNITY): Payer: BLUE CROSS/BLUE SHIELD | Admitting: Occupational Therapy

## 2018-05-21 VITALS — BP 130/76 | HR 69 | Ht 66.5 in | Wt 266.0 lb

## 2018-05-21 DIAGNOSIS — R4589 Other symptoms and signs involving emotional state: Secondary | ICD-10-CM

## 2018-05-21 DIAGNOSIS — F332 Major depressive disorder, recurrent severe without psychotic features: Secondary | ICD-10-CM

## 2018-05-21 DIAGNOSIS — F079 Unspecified personality and behavioral disorder due to known physiological condition: Secondary | ICD-10-CM

## 2018-05-21 NOTE — Progress Notes (Signed)
Patient presents with brighter affect, still depressed mood but stated she was feeling physically much improved today.  Patient reported sleeping more than 9 hours for the first time the previous night after her C-PAP machine was adjusted and feels more rested today.  Patient scored her depression an 8, anxiety an 8 and hopelessness a 0 on a scale of 0-10 with 0 being none and 10 the worst she could manage.  Patient stated while still depressed she was feeling much more hopeful now and in less physical pain today as well.  Patient denied any suicidal or homicidal ideations, no auditory or visual hallucinations and edema in feet was significantly less as there was no pitting, only at a 2+ and below her ankles.  Patient stated plans to follow up with her cardiologist and is making this appointment.  Reports no shortness of breath today or other physical health symptoms and pain management improved some.  Patient reported plan to use learned coping and distracting skills with better managing of daily life and health stressors.  Patient agreed to contact this nurse or PHP staff if any worsening of symptoms and stated having more energy today after C-PAP adjustment and started taking a daily vitamin for women.  Patient with no other concerns today and returned to group, makeup on and dressed nicely and comfortably for the program.

## 2018-05-21 NOTE — Therapy (Signed)
[place], [place], [address] Phone: [phone]   Fax:  [phone]  Occupational Therapy Treatment  Patient Details  Name: Linda Buckley MRN: [id number] Date of Birth: 05-15-1956 Referring Provider: Ricky Ala, NP   Encounter Date: 05/21/2018  OT End of Session - 05/21/18 1424    Visit Number  4    Number of Visits  16    Date for OT Re-Evaluation  06/11/18    Authorization Type  BCBS    OT Start Time  1100    OT Stop Time  1200    OT Time Calculation (min)  60 min    Activity Tolerance  Patient tolerated treatment well    Behavior During Therapy  [facility] for tasks assessed/performed       Past Medical History:  Diagnosis Date  . Allergy   . Anxiety   . Arthritis   . Carpal tunnel syndrome of right wrist    RECURRENT  . Depression   . Dysphagia 09/02/2017  . Edema 09/02/2017  . Environmental allergies    dust mites, pollen, roaches and other insects, mold  . Fibromyalgia 04/2016  . Headache    migraines  . Hereditary and idiopathic peripheral neuropathy 04/03/2016  . History of adenomatous polyp of colon   . History of gastric ulcer   . History of kidney stones   . Hypertension   . Pneumonia   . Sleep apnea    wears CPAP  . Wears glasses     Past Surgical History:  Procedure Laterality Date  . CARPAL TUNNEL RELEASE Right 2007  . CARPAL TUNNEL RELEASE Right 07/05/2015   Procedure: RIGHT HAND REVISION CARPAL TUNNEL RELEASE;  Surgeon: Iran Planas, MD;  Location: [facility];  Service: Orthopedics;  Laterality: Right;  . CARPAL TUNNEL RELEASE Left 09/2016  . CERVICAL DISC ARTHROPLASTY  03/2015  . COLONOSCOPY W/ POLYPECTOMY  04-15-2014  . CYSTO/  RIGHT RETROGRADE PYELOGRAM/ URETEROSCOPY STONE EXTRACTION/  STENT PLACEMENT  06-07-2010  . LAPAROSCOPIC ASSISTED VAGINAL HYSTERECTOMY  04-25-2004   ovaries remain  . TUBAL LIGATION  1982    There were no vitals filed for this  visit.  Subjective Assessment - 05/21/18 1422    Currently in Pain?  Other (Comment) chronic baseline pain (fibromyalgia dx)       S: "I can be assertive when I need to be"  O: Education and activities given in reference to increase assertiveness skills within daily life and relationships. Assertiveness quiz given to increase insight on pt current skills and how to improve based on given score. Further education given on assertive conversation, situations, body language, and appropriate context for skill. Worksheet given to identify three definitions (assertive, passive, and aggressive) with opportunity for role play between 2 participants in to promote assertiveness training in a variety of social settings (individuals in community and health care providers). Pt asked to identify one area to increase assertiveness this date for successful community integration.  A: Pt was actively engaged throughout entirety of group this date. Pt completed assertiveness quiz showing she is naturally assertive with some tendencies to be passive- pt in agreeance with this score. Pt completed assertiveness skills training worksheet. Pt actively participated in role playing activity to practice being assertive to a random community member and being an aggressive healthcare provider. Overall, pt shows need to continue building assertiveness skills. In reference to last activity, identified goal of practicing assertiveness in various social situations.  P:  Pt provided with assertiveness skills to implement into a variety of daily social situations. OT will continue to follow up with communication skills for successful implementation into daily life.                     OT Education - 05/21/18 1423    Education Details  education given on assertiveness for successful community reintegration    Person(s) Educated  Patient    Methods  Explanation;Handout    Comprehension  Verbalized understanding        OT Short Term Goals - 05/14/18 1511      OT SHORT TERM GOAL #1   Title  Pt will independently identify and implement volunteer opportunities to facilitate productivel comunity integration    Time  4    Period  Weeks    Status  New    Target Date  06/11/18      OT SHORT TERM GOAL #2   Title  Pt will independently apply psychosocial and coping mechanisms to daily activties in order to function independently and reintegrate into community dwelling    Time  4    Period  Weeks    Status  New    Target Date  06/11/18      OT SHORT TERM GOAL #3   Title  Pt will be educated on strategies to improve psychosocial skills needed to fully participate in all daily work, and leisure activities    Time  4    Period  Weeks    Status  New    Target Date  06/11/18               Plan - 05/21/18 1426    Occupational performance deficits (Please refer to evaluation for details):  ADL's;IADL's;Rest and Sleep;Work;Leisure;Social Participation       Patient will benefit from skilled therapeutic intervention in order to improve the following deficits and impairments:  Decreased coping skills, Decreased psychosocial skills, Other (comment)(decreased ability to engage in BADL and integrate into comunity)  Visit Diagnosis: Personality and behavioral disorder due to known physiological condition  Difficulty coping  Severe episode of recurrent major depressive disorder, without psychotic features [facility])    Problem List Patient Active Problem List   Diagnosis Date Noted  . MDD (major depressive disorder), recurrent episode, severe ([place]) 05/05/2018  . Vitamin D deficiency 11/05/2017  . Dysphagia 09/02/2017  . OSA (obstructive sleep apnea) 02/14/2017  . Multiple falls 09/04/2016  . Hereditary and idiopathic peripheral neuropathy 04/03/2016  . Exertional shortness of breath 01/26/2016  . Left leg pain 01/26/2016  . Trochanteric bursitis of both hips 12/19/2015  . Depression 08/28/2015   . Fibromyalgia 07/28/2015  . Right wrist pain 04/05/2015  . Routine general medical examination at a health care facility 03/01/2014  . HTN (hypertension) 01/12/2014  . Morbid obesity ([place]) 01/12/2014  . Pre-diabetes 01/12/2014  . Family history of lupus erythematosus 01/12/2014  . Fatigue 10/17/2013    Zenovia Jarred 05/21/2018, 2:27 PM  [facility] HOSPITALIZATION PROGRAM [place], [place], [address] Phone: [phone]   Fax:  [phone]  Name: LETESHA KLECKER MRN: [id number] Date of Birth: May 23, 1956

## 2018-05-21 NOTE — Psych (Signed)
   [facility] BH PHP THERAPIST PROGRESS NOTE  Linda Buckley [id number]  Session Time: 9:00 - 10:45  Participation Level: Active  Behavioral Response: CasualAlertDepressed  Type of Therapy: Group Therapy; psychotherapy  Treatment Goals addressed: Coping  Interventions: CBT, DBT, Solution Focused, Supportive and Reframing  Summary: . Clinician led check-in regarding current stressors and situation, and review of patient completed daily inventory. Clinician utilized active listening and empathetic response and validated patient emotions. Clinician facilitated processing group on pertinent issues.    Therapist Response: [name] is a 62 y.o. female who presents with depression symptoms. Patient arrived within time allowed and reports that she is feeling "in pain." Patient rates her mood at a 8 on a scale of 1-10 with 10 being great. Pt reports she had a pain management appt yesterday and the pain is aggravated now. Pt reports flare up of struggling with her pain issue and is having to work on accepting her limitations physically. Pt also shares her sister and cousin spent time with her yesterday which lifted mood. Pt is able to process her frustrations with new physical limits in processing.  Patient engaged in discussion.      Session Time: 10:45 -12:15  Participation Level: Active  Behavioral Response: CasualAlertDepressed  Type of Therapy: Group Therapy, psychotherapy  Treatment Goals addressed: Coping  Interventions: Strengths based, reframing, Supportive,   Summary:  Spiritual Care group  Therapist Response: Patient engaged in group. See chaplain note.         Session Time: 12:15 - 1:00  Participation Level: Active  Behavioral Response: CasualAlertDepressed  Type of Therapy: Group Therapy, Activity Therapy  Treatment Goals addressed: Coping  Interventions: Systems analyst, Supportive  Summary:  Reflection Group: Patients encouraged to practice  skills and interpersonal techniques or work on mindfulness and relaxation techniques. The importance of self-care and making skills part of a routine to increase usage were stressed   Therapist Response: Patient engaged and participated appropriately.       Session Time: 1:00- 2:00  Participation Level: Active  Behavioral Response: CasualAlertDepressed  Type of Therapy: Group Therapy, Psychoeducation, Activity therapy  Treatment Goals addressed: Coping  Interventions: relaxation training; Supportive; Reframing  Summary: 12:45 - 1:50: Relaxation group: Cln led group focused on retraining the body's response to stress.   1:50 -2:00 Clinician led check-out. Clinician assessed for immediate needs, medication compliance and efficacy, and safety concerns   Therapist Response: Patient engaged activity and discussion. At [place], patient rates her mood at a 9 on a scale of 1-10 with 10 being great. Patient reports plans to organize her home this afternoon. Patient demonstrates some progress as evidenced by willingness to work on acceptance of her pain. Patient denies SI/HI/self-harm thoughts at the end of group.    Suicidal/Homicidal: Nowithout intent/plan   Plan: Pt will continue in PHP and work to decrease depression symptoms and increase ability to self manage symptoms as they arise.    Diagnosis: Severe episode of recurrent major depressive disorder, without psychotic features ([place]) [F33.2]    1. Severe episode of recurrent major depressive disorder, without psychotic features ([place])       [name], LCSW 05/21/2018

## 2018-05-22 ENCOUNTER — Encounter (HOSPITAL_COMMUNITY): Payer: Self-pay | Admitting: Occupational Therapy

## 2018-05-22 ENCOUNTER — Other Ambulatory Visit (HOSPITAL_COMMUNITY): Payer: BLUE CROSS/BLUE SHIELD | Admitting: Licensed Clinical Social Worker

## 2018-05-22 ENCOUNTER — Other Ambulatory Visit (HOSPITAL_COMMUNITY): Payer: BLUE CROSS/BLUE SHIELD | Admitting: Occupational Therapy

## 2018-05-22 DIAGNOSIS — F332 Major depressive disorder, recurrent severe without psychotic features: Secondary | ICD-10-CM | POA: Diagnosis not present

## 2018-05-22 DIAGNOSIS — F079 Unspecified personality and behavioral disorder due to known physiological condition: Secondary | ICD-10-CM

## 2018-05-22 DIAGNOSIS — R4589 Other symptoms and signs involving emotional state: Secondary | ICD-10-CM

## 2018-05-22 NOTE — Psych (Signed)
   [facility] BH PHP THERAPIST PROGRESS NOTE  [name] [id number]  Session Time: 9:00 - 11:00  Participation Level: Active  Behavioral Response: CasualAlertDepressed  Type of Therapy: Group Therapy; psychotherapy  Treatment Goals addressed: Coping  Interventions: CBT, DBT, Solution Focused, Supportive and Reframing  Summary: . Clinician led check-in regarding current stressors and situation, and review of patient completed daily inventory. Clinician utilized active listening and empathetic response and validated patient emotions. Clinician facilitated processing group on pertinent issues.    Therapist Response: ALEXANDRE LIGHTSEY is a 61 y.o. female who presents with depression symptoms. Patient arrived within time allowed and reports that she is feeling "okay." Patient rates her mood at a 7 on a scale of 1-10 with 10 being great. Pt reports her granddaughter stayed with her last night and that was a helpful and pleasant distraction.  Pt reports wanting to work on balance.  Patient engaged in discussion.      Session Time: 11:00 -12:15   Participation Level: Active   Behavioral Response: CasualAlertDepressed   Type of Therapy: Group Therapy, OT   Treatment Goals addressed: Coping   Interventions: Psychosocial skills training, Supportive,    Summary:  Occupational Therapy group   Therapist Response: Patient engaged in group. See OT note.          Session Time: 12:15 - 1:00  Participation Level: Active  Behavioral Response: CasualAlertDepressed  Type of Therapy: Group Therapy, Activity Therapy  Treatment Goals addressed: Coping  Interventions: Systems analyst, Supportive  Summary:  Reflection Group: Patients encouraged to practice skills and interpersonal techniques or work on mindfulness and relaxation techniques. The importance of self-care and making skills part of a routine to increase usage were stressed   Therapist Response: Patient engaged and  participated.      Session Time: 1:00 - 2:00   Participation Level: Active   Behavioral Response: CasualAlertDepressed   Type of Therapy: Group Therapy, Psychotherapy; Psychoeducation   Treatment Goals addressed: Coping   Interventions: CBT, Solution focused, Supportive, Reframing   Summary: 1:00 - 1:50 Cln introduced topic of boundaries. Cln provided psychoeducation on what boundaries are, porous, rigid and healthy boundary characteristics, and the different types of boundaries. Group related the information to themselves to begin discovering potential boundary issues.  1:50 -2:00 Clinician led check-out. Clinician assessed for immediate needs, medication compliance and efficacy, and safety concerns      Therapist Response: Patient engaged in group. Pt reports understanding of boundaries and shares she has mainly rigid boundaries.  At [place], patient rates her mood at a 8 on a scale of 1-10 with 10 being great. Pt reports afternoon plans of resting. Patient demonstrates some progress as evidenced by reporting wanting to add more into her days. Patient denies SI/HI/self-harm thoughts at the end of group.     Suicidal/Homicidal: Nowithout intent/plan   Plan: Pt will continue in PHP and work to decrease depression symptoms and increase ability to self manage symptoms as they arise.    Diagnosis: Severe episode of recurrent major depressive disorder, without psychotic features ([place]) [F33.2]    1. Severe episode of recurrent major depressive disorder, without psychotic features ([place])       [name], LCSW 05/22/2018

## 2018-05-22 NOTE — Therapy (Signed)
[place], [place], [address] Phone: [phone]   Fax:  [phone]  Occupational Therapy Treatment  Patient Details  Name: Linda Buckley MRN: [id number] Date of Birth: 1956/03/27 Referring Provider: Ricky Ala, NP   Encounter Date: 05/22/2018  OT End of Session - 05/22/18 1244    Visit Number  5    Number of Visits  16    Date for OT Re-Evaluation  06/11/18    Authorization Type  BCBS    OT Start Time  1100    OT Stop Time  1200    OT Time Calculation (min)  60 min    Activity Tolerance  Patient tolerated treatment well    Behavior During Therapy  [facility] for tasks assessed/performed       Past Medical History:  Diagnosis Date  . Allergy   . Anxiety   . Arthritis   . Carpal tunnel syndrome of right wrist    RECURRENT  . Depression   . Dysphagia 09/02/2017  . Edema 09/02/2017  . Environmental allergies    dust mites, pollen, roaches and other insects, mold  . Fibromyalgia 04/2016  . Headache    migraines  . Hereditary and idiopathic peripheral neuropathy 04/03/2016  . History of adenomatous polyp of colon   . History of gastric ulcer   . History of kidney stones   . Hypertension   . Pneumonia   . Sleep apnea    wears CPAP  . Wears glasses     Past Surgical History:  Procedure Laterality Date  . CARPAL TUNNEL RELEASE Right 2007  . CARPAL TUNNEL RELEASE Right 07/05/2015   Procedure: RIGHT HAND REVISION CARPAL TUNNEL RELEASE;  Surgeon: Iran Planas, MD;  Location: [facility];  Service: Orthopedics;  Laterality: Right;  . CARPAL TUNNEL RELEASE Left 09/2016  . CERVICAL DISC ARTHROPLASTY  03/2015  . COLONOSCOPY W/ POLYPECTOMY  04-15-2014  . CYSTO/  RIGHT RETROGRADE PYELOGRAM/ URETEROSCOPY STONE EXTRACTION/  STENT PLACEMENT  06-07-2010  . LAPAROSCOPIC ASSISTED VAGINAL HYSTERECTOMY  04-25-2004   ovaries remain  . TUBAL LIGATION  1982    There were no vitals filed for this  visit.  Subjective Assessment - 05/22/18 1243    Currently in Pain?  Other (Comment) baseline chronic pain (fibromyalgia dx)        S: "I know it's important to remember I cannot control others"  O: Communication skills group completed with emphasis on control, influence and acceptance in regards to social scenarios. The control, influence, acceptance (CIA) framework was provided as a communication skill to help address uncomfortable or "elephant in the room" social situations. Pt was to write out 2-3 uncomfortable social situations to analyze within group. This exercise offered an opportunity for self-reflection in order to relieve stress and uncertainty when using appropriate communication skills/strategies when reintegrating into the community and daily routines.   A: Pt completed communication activity with peers. Pt actively engaged in communication skill worksheets this date. Pt identified 3 uncomfortable social situations with focus on others actions, religion, and family members choices. Pt identified that she cannot control what others think or say, can sometimes influence others, but ultimately accepts others. Pt was able to accurately identify areas of control vs. areas not within personal control after presented with CIA framework. Pt expressed improved communication skill this date to carry over into community integration (work, other daily routines).   P: Pt provided with communication skills in the area of the  CIA framework to implement into daily social situations when reintegrating into the community. OT will continue follow up with communication skills for successful implementation in daily life.                    OT Education - 05/22/18 1243    Education Details  education given on communication skills to apply to successful community reintegration    Person(s) Educated  Patient    Methods  Explanation;Handout    Comprehension  Verbalized understanding        OT Short Term Goals - 05/14/18 1511      OT SHORT TERM GOAL #1   Title  Pt will independently identify and implement volunteer opportunities to facilitate productivel comunity integration    Time  4    Period  Weeks    Status  New    Target Date  06/11/18      OT SHORT TERM GOAL #2   Title  Pt will independently apply psychosocial and coping mechanisms to daily activties in order to function independently and reintegrate into community dwelling    Time  4    Period  Weeks    Status  New    Target Date  06/11/18      OT SHORT TERM GOAL #3   Title  Pt will be educated on strategies to improve psychosocial skills needed to fully participate in all daily work, and leisure activities    Time  4    Period  Weeks    Status  New    Target Date  06/11/18               Plan - 05/22/18 1248    Occupational performance deficits (Please refer to evaluation for details):  ADL's;IADL's;Rest and Sleep;Work;Leisure;Social Participation       Patient will benefit from skilled therapeutic intervention in order to improve the following deficits and impairments:  Decreased coping skills, Decreased psychosocial skills, Other (comment)(decreased ability to engage in IADL and integrate into community)  Visit Diagnosis: Personality and behavioral disorder due to known physiological condition  Difficulty coping  Severe episode of recurrent major depressive disorder, without psychotic features [facility])    Problem List Patient Active Problem List   Diagnosis Date Noted  . MDD (major depressive disorder), recurrent episode, severe ([place]) 05/05/2018  . Vitamin D deficiency 11/05/2017  . Dysphagia 09/02/2017  . OSA (obstructive sleep apnea) 02/14/2017  . Multiple falls 09/04/2016  . Hereditary and idiopathic peripheral neuropathy 04/03/2016  . Exertional shortness of breath 01/26/2016  . Left leg pain 01/26/2016  . Trochanteric bursitis of both hips 12/19/2015  . Depression 08/28/2015   . Fibromyalgia 07/28/2015  . Right wrist pain 04/05/2015  . Routine general medical examination at a health care facility 03/01/2014  . HTN (hypertension) 01/12/2014  . Morbid obesity ([place]) 01/12/2014  . Pre-diabetes 01/12/2014  . Family history of lupus erythematosus 01/12/2014  . Fatigue 10/17/2013   Zenovia Jarred, MSOT, OTR/L  [place] 05/22/2018, 12:53 PM  [facility] PARTIAL HOSPITALIZATION PROGRAM [place], [place], [address] Phone: [phone]   Fax:  [phone]  Name: Linda Buckley MRN: [id number] Date of Birth: [birth year]

## 2018-05-25 ENCOUNTER — Encounter (HOSPITAL_COMMUNITY): Payer: Self-pay | Admitting: Occupational Therapy

## 2018-05-25 ENCOUNTER — Other Ambulatory Visit (HOSPITAL_COMMUNITY): Payer: BLUE CROSS/BLUE SHIELD | Admitting: Licensed Clinical Social Worker

## 2018-05-25 ENCOUNTER — Other Ambulatory Visit (HOSPITAL_COMMUNITY): Payer: BLUE CROSS/BLUE SHIELD | Admitting: Occupational Therapy

## 2018-05-25 DIAGNOSIS — F332 Major depressive disorder, recurrent severe without psychotic features: Secondary | ICD-10-CM

## 2018-05-25 DIAGNOSIS — F079 Unspecified personality and behavioral disorder due to known physiological condition: Secondary | ICD-10-CM

## 2018-05-25 DIAGNOSIS — R4589 Other symptoms and signs involving emotional state: Secondary | ICD-10-CM

## 2018-05-25 NOTE — Therapy (Addendum)
[facility] [place], [place], [address] Phone: [phone]   Fax:  [phone]  Occupational Therapy Treatment  Patient Details  Name: Linda Buckley MRN: [id number] Date of Birth: Nov 10, 1956 Referring Provider: Ricky Ala, NP   Encounter Date: 05/25/2018    Past Medical History:  Diagnosis Date  . Allergy   . Anxiety   . Arthritis   . Carpal tunnel syndrome of right wrist    RECURRENT  . Depression   . Dysphagia 09/02/2017  . Edema 09/02/2017  . Environmental allergies    dust mites, pollen, roaches and other insects, mold  . Fibromyalgia 04/2016  . Headache    migraines  . Hereditary and idiopathic peripheral neuropathy 04/03/2016  . History of adenomatous polyp of colon   . History of gastric ulcer   . History of kidney stones   . Hypertension   . Pneumonia   . Sleep apnea    wears CPAP  . Wears glasses     Past Surgical History:  Procedure Laterality Date  . CARPAL TUNNEL RELEASE Right 2007  . CARPAL TUNNEL RELEASE Right 07/05/2015   Procedure: RIGHT HAND REVISION CARPAL TUNNEL RELEASE;  Surgeon: Iran Planas, MD;  Location: [facility];  Service: Orthopedics;  Laterality: Right;  . CARPAL TUNNEL RELEASE Left 09/2016  . CERVICAL DISC ARTHROPLASTY  03/2015  . COLONOSCOPY W/ POLYPECTOMY  04-15-2014  . CYSTO/  RIGHT RETROGRADE PYELOGRAM/ URETEROSCOPY STONE EXTRACTION/  STENT PLACEMENT  06-07-2010  . LAPAROSCOPIC ASSISTED VAGINAL HYSTERECTOMY  04-25-2004   ovaries remain  . TUBAL LIGATION  1982    There were no vitals filed for this visit.     S: "I used to have difficulty having accountability with my finances"  O: Pt educated on the definition and 3 types of self-accountability (your actions/responsibilities, responsibilities, and goals) with verbal instruction and encouragement to give personal examples.Pt asked to rate current accountability level 1-10 (10 being great, 1 being poor).  Education further given on self-accountability being in line with personal values and goals to maintain occupational balance in various community settings. Pt given goal identifying worksheet to list immediate, short term, medium term, and long-term goals using a SMART goal framework (specificity, meaningful, adaptive, realistic, and time bound). Goals created as guideline for pt to practice being accountable in various situations. Pt completed work sheet of goals and encouraged to share goals with the group, with emphasis on immediate goal for check in with pt for next session to maintain accountability.   A: Pt presented to group with appropriate affect, sharing examples with group members and facilitator. Pt engaged in verbal discussion of definitions of self-accountability with personal examples. Pt rated accountability a 9/10, stating she only has some issues with her financial management. Pt completed goals work sheet using SMART goal framework, while remaining in line with values for promotion of occupational balance to help foster continuous practice of accountability skills. Pt identified immediate goal of "I am going to organize the bills by due date to help my husband with the financial management". Pt identified that she is moderately living in line with her values this date, after reflecting on worksheet completed in group.  P: Pt provided with education on improving accountability. OT will continue to follow up with communication skills for successful implementation into daily life.                        OT Short Term  Goals - 05/28/18 1316      OT SHORT TERM GOAL #1   Title  Pt will independently identify and implement volunteer opportunities to facilitate productive community integration    Time  4    Period  Weeks    Status  Achieved    Target Date  06/11/18      OT SHORT TERM GOAL #2   Title  Pt will independently apply psychosocial and coping mechanisms to  daily activties in order to function independently and reintegrate into community dwelling    Time  4    Period  Weeks    Status  Achieved      OT SHORT TERM GOAL #3   Title  Pt will be educated on strategies to improve psychosocial skills needed to fully participate in all daily work, and leisure activities    Time  4    Period  Weeks    Status  Achieved                   Patient will benefit from skilled therapeutic intervention in order to improve the following deficits and impairments:  Decreased coping skills, Decreased psychosocial skills, Other (comment)(decreased ability to engage in BADL and integrate into community)  Visit Diagnosis: Personality and behavioral disorder due to known physiological condition  Difficulty coping  Severe episode of recurrent major depressive disorder, without psychotic features ([place])    Problem List Patient Active Problem List   Diagnosis Date Noted  . MDD (major depressive disorder), recurrent episode, severe ([place]) 05/05/2018  . Vitamin D deficiency 11/05/2017  . Dysphagia 09/02/2017  . OSA (obstructive sleep apnea) 02/14/2017  . Multiple falls 09/04/2016  . Hereditary and idiopathic peripheral neuropathy 04/03/2016  . Exertional shortness of breath 01/26/2016  . Left leg pain 01/26/2016  . Trochanteric bursitis of both hips 12/19/2015  . Depression 08/28/2015  . Fibromyalgia 07/28/2015  . Right wrist pain 04/05/2015  . Routine general medical examination at a health care facility 03/01/2014  . HTN (hypertension) 01/12/2014  . Morbid obesity ([place]) 01/12/2014  . Pre-diabetes 01/12/2014  . Family history of lupus erythematosus 01/12/2014  . Fatigue 10/17/2013    OCCUPATIONAL THERAPY DISCHARGE SUMMARY  Visits from Start of Care: 6  Current functional level related to goals / functional outcomes: Independent    Remaining deficits: N/A/ Pt is stepping down to IOP level of care for continued learning/maintenace of  coping strategies.   Education / Equipment: Pt was educated on coping and psychosocial skills to apply to BADL/IADL for successful community reintegration. Plan: Patient agrees to discharge.  Patient goals were met. Patient is being discharged due to meeting the stated rehab goals.  ?????        Zenovia Jarred, MSOT, OTR/L  [place] 05/28/2018, 1:17 PM  [facility] HOSPITALIZATION PROGRAM [place], [place], [address] Phone: [phone]   Fax:  [phone]  Name: Linda Buckley MRN: [id number] Date of Birth: Feb 06, 1956

## 2018-05-25 NOTE — Psych (Signed)
   [facility] BH PHP THERAPIST PROGRESS NOTE  Linda Buckley [id number]  Session Time: 9:00 - 10:15  Participation Level: Active  Behavioral Response: CasualAlertDepressed  Type of Therapy: Group Therapy; psychotherapy  Treatment Goals addressed: Coping  Interventions: CBT, DBT, Solution Focused, Supportive and Reframing  Summary: . Clinician led check-in regarding current stressors and situation, and review of patient completed daily inventory. Clinician utilized active listening and empathetic response and validated patient emotions. Clinician facilitated processing group on pertinent issues.    Therapist Response: Linda Buckley is a 62 y.o. female who presents with depression symptoms. Patient arrived within time allowed and reports that she is feeling "moody." Patient rates her mood at a 7 on a scale of 1-10 with 10 being great. Pt reports she was able to cook dinner last night and was proud of that. Pt shares she woke up in a "funk" and is not in good spirits.  Pt reports wanting to work on managing mood when she is in pain.  Patient engaged in discussion.      Session Time: 10:15 -11:00  Participation Level: Active  Behavioral Response: CasualAlertDepressed  Type of Therapy: Group Therapy, psychoeducation, psychotherapy  Treatment Goals addressed: Coping  Interventions: CBT, DBT, Solution Focused, Supportive and Reframing  Summary:  Clinician continued discussion on boundaries. Clinician reviewed information discussed yesterday. Group discussed the different ways boundaries can present including material, time, physical, and emotional and examples of each presentation.  Therapist Response: Patient participated and reports understanding of the different ways boundaries can be seen.     Session Time: 11:00 -12:15   Participation Level: Active   Behavioral Response: CasualAlertDepressed   Type of Therapy: Group Therapy, OT   Treatment Goals addressed: Coping   Interventions: Psychosocial skills training, Supportive,    Summary:  Occupational Therapy group   Therapist Response: Patient engaged in group. See OT note.         Session Time: 12:00- 1:00  Participation Level: Active  Behavioral Response: CasualAlertDepressed  Type of Therapy: Group Therapy, Psychoeducation; Psychotherapy  Treatment Goals addressed: Coping  Interventions: CBT; Solution focused; Supportive; Reframing  Summary: 12:00 - 12:50 Clinician provided education on how to set boundaries. Cln utilized handout "How to set a Boundary" and "Healthy Boundary setting." Pt's shared boundary issues and group discussed how to handle them.  12:50 -1:00 Clinician led check-out. Clinician assessed for immediate needs, medication compliance and efficacy, and safety concerns   Therapist Response: Patient engaged activity and discussion. Pt reports understanding of how to set a boundary and was able to walk through a personal example with the group. At [place], patient rates her mood at a 7 on a scale of 1-10 with 10 being great. Patient reports weekend plans of doing housework and attending a graduation if she feels up to it. Patient demonstrates some progress as evidenced by adding tasks to her day. Patient denies SI/HI/self-harm at the end of group.    Suicidal/Homicidal: Nowithout intent/plan   Plan: Pt will continue in PHP and work to decrease depression symptoms and increase ability to self manage symptoms as they arise.    Diagnosis: Severe episode of recurrent major depressive disorder, without psychotic features ([place]) [F33.2]    1. Severe episode of recurrent major depressive disorder, without psychotic features ([place])       [name], LCSW 05/25/2018

## 2018-05-26 ENCOUNTER — Telehealth: Payer: Self-pay | Admitting: General Practice

## 2018-05-26 ENCOUNTER — Other Ambulatory Visit (HOSPITAL_COMMUNITY): Payer: Self-pay

## 2018-05-26 ENCOUNTER — Telehealth (HOSPITAL_COMMUNITY): Payer: Self-pay | Admitting: Professional

## 2018-05-26 ENCOUNTER — Ambulatory Visit (HOSPITAL_COMMUNITY): Payer: Self-pay

## 2018-05-26 NOTE — Psych (Signed)
   [facility] BH PHP THERAPIST PROGRESS NOTE  Linda Buckley [id number]  Session Time: 9:00 - 11:00  Participation Level: Active  Behavioral Response: CasualAlertDepressed  Type of Therapy: Group Therapy; psychotherapy  Treatment Goals addressed: Coping  Interventions: CBT, DBT, Solution Focused, Supportive and Reframing  Summary: . Clinician led check-in regarding current stressors and situation, and review of patient completed daily inventory. Clinician utilized active listening and empathetic response and validated patient emotions. Clinician facilitated processing group on pertinent issues.    Therapist Response: [name] is a 62 y.o. female who presents with depression symptoms. Patient arrived within time allowed and reports that she is feeling "unsettled." Patient rates her mood at a 7 on a scale of 1-10 with 10 being great. Pt reports she finally completed the task of organizing her bathroom and felt accomplished. Pt reports continued struggles with fibromyalgia and managing the way the pain affects her mood. Patient engaged in discussion.      Session Time: 11:00 -12:00   Participation Level: Active   Behavioral Response: CasualAlertDepressed   Type of Therapy: Group Therapy, OT   Treatment Goals addressed: Coping   Interventions: Psychosocial skills training, Supportive,    Summary:  Occupational Therapy group   Therapist Response: Patient engaged in group. See OT note.              Session Time: 12:00 - 12:45  Participation Level: Active  Behavioral Response: CasualAlertDepressed  Type of Therapy: Group Therapy, Activity Therapy  Treatment Goals addressed: Coping  Interventions: Systems analyst, Supportive  Summary:  Reflection Group: Patients encouraged to practice skills and interpersonal techniques or work on mindfulness and relaxation techniques. The importance of self-care and making skills part of a routine to increase usage  were stressed   Therapist Response: Patient engaged and participated.       Session Time: 12:45- 2:00  Participation Level: Active  Behavioral Response: CasualAlertDepressed  Type of Therapy: Group Therapy, Psychoeducation; Psychotherapy  Treatment Goals addressed: Coping  Interventions: CBT; Solution focused; Supportive; Reframing  Summary: 12:45 - 1:50: Clinician led group on The Five Love Languages and how they can aid relationships. Group members discussed the importance of each language and took the [employer] quiz. Cln discussed how looking at love languages can counteract unhealthy thought patterns and improve interpersonal relationships. 1:50 -2:00 Clinician led check-out. Clinician assessed for immediate needs, medication compliance and efficacy, and safety concerns   Therapist Response: Patient engaged in activity and discussion. Patient identified her love language as physical time and this seems true to her. Pt brainstorms ways to get that need met in her life.  At [name], patient rates her mood at a 8 on a scale of 1-10 with 10 being great. Pt reports plans to work on organizing a different room in her house. Pt demonstrates some progress as evidenced by continued effort and increased ability to complete tasks. Pt denies SI/HI/self-harm at the end of group.     Suicidal/Homicidal: Nowithout intent/plan   Plan: Pt will continue in PHP and work to decrease depression symptoms and increase ability to self manage symptoms as they arise.    Diagnosis: Severe episode of recurrent major depressive disorder, without psychotic features ([place]) [F33.2]    1. Severe episode of recurrent major depressive disorder, without psychotic features ([place])       [name], LCSW 05/26/2018

## 2018-05-26 NOTE — Telephone Encounter (Signed)
Please advise, Pt should have an appointment to discuss this procedure with you?   Copied from [place] [id number]. Topic: General - Other >> May 26, 2018 12:16 PM Valla Leaver wrote: Reason for CRM: Patient calling to notify Dr. Birdie Riddle that her insurance will cover lipo and she needs a letter from Dr. Birdie Riddle to go to [facility] stating that she qualifies for lipo due to her weight. Please call patient back to notify her whether the letter will be completed.

## 2018-05-27 ENCOUNTER — Encounter (HOSPITAL_COMMUNITY): Payer: Self-pay | Admitting: Family

## 2018-05-27 ENCOUNTER — Other Ambulatory Visit (HOSPITAL_COMMUNITY): Payer: BLUE CROSS/BLUE SHIELD | Admitting: Licensed Clinical Social Worker

## 2018-05-27 DIAGNOSIS — F332 Major depressive disorder, recurrent severe without psychotic features: Secondary | ICD-10-CM

## 2018-05-27 NOTE — Psych (Addendum)
   [facility] BH PHP THERAPIST PROGRESS NOTE  Linda Buckley [id number]  Session Time: 9:00 - 10:45  Participation Level: Active  Behavioral Response: CasualAlertDepressed  Type of Therapy: Group Therapy; psychotherapy  Treatment Goals addressed: Coping  Interventions: CBT, DBT, Solution Focused, Supportive and Reframing  Summary: . Clinician led check-in regarding current stressors and situation, and review of patient completed daily inventory. Clinician utilized active listening and empathetic response and validated patient emotions. Clinician facilitated processing group on pertinent issues.    Therapist Response: ALYZABETH PONTILLO is a 62 y.o. female who presents with depression symptoms. Patient arrived within time allowed and reports that she is feeling "good." Patient rates her mood at a 10 on a scale of 1-10 with 10 being great. Pt reports she completed another task by organizing the last room in her apartment and felt productive. Pt reports she called insurance regarding the possibility of weight loss surgery to aid her fibromyalgia and received positive information. Pt states sleeping 9 hours last night which she feels has elevated her mood. Patient engaged in discussion.       Session Time: 10:45 -12:15  Participation Level: Active  Behavioral Response: CasualAlertDepressed  Type of Therapy: Group Therapy, psychotherapy  Treatment Goals addressed: Coping  Interventions: Strengths based, reframing, Supportive,   Summary:  Spiritual Care group  Therapist Response: Patient engaged in group. See chaplain note.         Session Time: 12:15 - 1:00  Participation Level: Active  Behavioral Response: CasualAlertDepressed  Type of Therapy: Group Therapy, Activity Therapy  Treatment Goals addressed: Coping  Interventions: Systems analyst, Supportive  Summary:  Reflection Group: Patients encouraged to practice skills and interpersonal techniques or work  on mindfulness and relaxation techniques. The importance of self-care and making skills part of a routine to increase usage were stressed   Therapist Response: Patient engaged and participated appropriately.       Session Time: 1:00- 2:00  Participation Level: Active  Behavioral Response: CasualAlertDepressed  Type of Therapy: Group Therapy, Psychoeducation, Activity therapy  Treatment Goals addressed: Coping  Interventions: relaxation training; Supportive; Reframing  Summary: 12:45 - 1:50: Relaxation group: Cln led group focused on retraining the body's response to stress.   1:50 -2:00 Clinician led check-out. Clinician assessed for immediate needs, medication compliance and efficacy, and safety concerns   Therapist Response: Patient engaged in activity and discussion. At [place], patient rates her mood at a 10 on a scale of 1-10 with 10 being great. Patient reports afternoon plans of going swimming. Patient demonstrates some progress as evidenced by increased activity and mood. Patient denies SI/HI/self-harm thoughts at the end of group.     Suicidal/Homicidal: Nowithout intent/plan   Plan: Pt will discharge from PHP due to meeting treatment goals of decreased depression symptoms and increased ability to manage symptoms as they arise. Pt continues to struggle with comorbidity of chronic pain and depression. Pt will step down to IOP within this agency to further increase stability. Pt and provider are aligned with discharge plan. Pt will begin IOP on 05/28/18. Pt denies SI/HI/psychosis at time of discharge.    Diagnosis: Severe episode of recurrent major depressive disorder, without psychotic features ([place]) [F33.2]    1. Severe episode of recurrent major depressive disorder, without psychotic features ([place])       [name], LCSW 05/27/2018

## 2018-05-27 NOTE — Progress Notes (Signed)
  [facility] Partial hospitalization outpatient Program Discharge Summary  Linda Buckley [id number]  Admission date: 05/12/2018 Discharge date: 05/27/2018  Reason for admission: Depression  Per [facility] assessment notes- Pt reports she went inpt due to increased SI with plan of driving over/into a bridge. Pt shares she has dealt with depression for 40+ years but has always been able to "throw myself into things to distract me" including children, work, marriage. Pt reports she has been unable to work since 2015-03-22 due to physical issues and that has made her depression worse. Pt shares her older brother died in 2001-03-21 and still has a lot of grief about this. Pt reports she was renting-to-own a home and was 1 year away from taking over the mrtg when the owner gave them 45 days notice to move out in 09/2017.  Pt report her and her husband moved into an apartment "and a lot of college students live there and they are driving me crazy." Pt shares she experienced a lot of crying spells, wouldn't leave her bedroom, and was sleeping most of the day. Pt shares she started drinking wine and smoked a cigar on the Sunday prior to inpt "which should have been a sign. I don't smoke and I have not drank in 2 years." Pt reports she felt hopeless and worthless, which lead to SI. Pt reports she felt unable to share her feelings with her family "because they are really religious and I know the bible says that is the one sin you can't be forgiven for." Pt reports her family has been very supportive of her since her inpt stay. Pt shares she has no hx of tx prior to inpt stay. Pt reports SI is better and she does not want to kill herself because she is hopeful for treatment. Pt reports "I sometimes see things out of the corner of my eye and when I look it is not there. I sometimes hear a TV on in the next room when it is not." Pt denies any commands. Pt denies HI.   Family of Origin Issues: Reports good family support  system since her recent inpatient admission.  Denies any current concerns at present.   Progress in Program Toward Treatment Goals: Ongoing, Zaila attended and participated during group session.  Reports her depression has been improved since her admission to [facility] partial hospitalization program.  Roselyn Reef treatment plan will be ongoing as she is stepping down to intensive outpatient program.  Progress (rationale): IOP stepdown   Take all medications as prescribed. Keep all follow-up appointments as scheduled.  Do not consume alcohol or use illegal drugs while on prescription medications. Report any adverse effects from your medications to your primary care provider promptly.  In the event of recurrent symptoms or worsening symptoms, call 911, a crisis hotline, or go to the nearest emergency department for evaluation.   Derrill Center, NP 05/27/2018

## 2018-05-27 NOTE — Telephone Encounter (Signed)
Reviewed in PCP absence. Will leave for Dr. Virgil Benedict review since this is not urgent.

## 2018-05-28 ENCOUNTER — Other Ambulatory Visit (HOSPITAL_COMMUNITY): Payer: BLUE CROSS/BLUE SHIELD | Admitting: Psychiatry

## 2018-05-28 ENCOUNTER — Encounter (HOSPITAL_COMMUNITY): Payer: Self-pay | Admitting: Psychiatry

## 2018-05-28 ENCOUNTER — Other Ambulatory Visit (HOSPITAL_COMMUNITY): Payer: Self-pay

## 2018-05-28 ENCOUNTER — Ambulatory Visit (HOSPITAL_COMMUNITY): Payer: Self-pay

## 2018-05-28 DIAGNOSIS — F332 Major depressive disorder, recurrent severe without psychotic features: Secondary | ICD-10-CM

## 2018-05-28 NOTE — Therapy (Deleted)
[place], [place], [address] Phone: [phone]   Fax:  [phone]  Occupational Therapy Treatment  Patient Details  Name: Linda Buckley MRN: [id number] Date of Birth: Jul 28, 1956 Referring Provider: Ricky Ala, NP   Encounter Date: 05/27/2018    Past Medical History:  Diagnosis Date  . Allergy   . Anxiety   . Arthritis   . Carpal tunnel syndrome of right wrist    RECURRENT  . Depression   . Dysphagia 09/02/2017  . Edema 09/02/2017  . Environmental allergies    dust mites, pollen, roaches and other insects, mold  . Fibromyalgia 04/2016  . Headache    migraines  . Hereditary and idiopathic peripheral neuropathy 04/03/2016  . History of adenomatous polyp of colon   . History of gastric ulcer   . History of kidney stones   . Hypertension   . Pneumonia   . Sleep apnea    wears CPAP  . Wears glasses     Past Surgical History:  Procedure Laterality Date  . CARPAL TUNNEL RELEASE Right 2007  . CARPAL TUNNEL RELEASE Right 07/05/2015   Procedure: RIGHT HAND REVISION CARPAL TUNNEL RELEASE;  Surgeon: Iran Planas, MD;  Location: [facility];  Service: Orthopedics;  Laterality: Right;  . CARPAL TUNNEL RELEASE Left 09/2016  . CERVICAL DISC ARTHROPLASTY  03/2015  . COLONOSCOPY W/ POLYPECTOMY  04-15-2014  . CYSTO/  RIGHT RETROGRADE PYELOGRAM/ URETEROSCOPY STONE EXTRACTION/  STENT PLACEMENT  06-07-2010  . LAPAROSCOPIC ASSISTED VAGINAL HYSTERECTOMY  04-25-2004   ovaries remain  . TUBAL LIGATION  1982    There were no vitals filed for this visit.    OCCUPATIONAL THERAPY DISCHARGE SUMMARY  Visits from Start of Care: 6  Current functional level related to goals / functional outcomes: Independent    Remaining deficits: Emotional regulation. Pt is stepping down to IOP level of care for continued maintenance and progress of coping strategies.   Education / Equipment: Pt educated on psychosocial  and coping skills to apply to BADL/IADL when reintegrating into the community. Plan: Patient agrees to discharge.  Patient goals were met. Patient is being discharged due to meeting the stated rehab goals.  ?????                          OT Short Term Goals - 05/25/18 1323      OT SHORT TERM GOAL #1   Title  Pt will independently identify and implement volunteer opportunities to facilitate productive community integration                 Patient will benefit from skilled therapeutic intervention in order to improve the following deficits and impairments:     Visit Diagnosis: Severe episode of recurrent major depressive disorder, without psychotic features [facility])    Problem List Patient Active Problem List   Diagnosis Date Noted  . MDD (major depressive disorder), recurrent episode, severe ([place]) 05/05/2018  . Vitamin D deficiency 11/05/2017  . Dysphagia 09/02/2017  . OSA (obstructive sleep apnea) 02/14/2017  . Multiple falls 09/04/2016  . Hereditary and idiopathic peripheral neuropathy 04/03/2016  . Exertional shortness of breath 01/26/2016  . Left leg pain 01/26/2016  . Trochanteric bursitis of both hips 12/19/2015  . Depression 08/28/2015  . Fibromyalgia 07/28/2015  . Right wrist pain 04/05/2015  . Routine general medical examination at a health care facility 03/01/2014  . HTN (hypertension) 01/12/2014  . Morbid  obesity ([place]) 01/12/2014  . Pre-diabetes 01/12/2014  . Family history of lupus erythematosus 01/12/2014  . Fatigue 10/17/2013   Zenovia Jarred, MSOT, OTR/L  [place] 05/28/2018, 1:04 PM  [facility] PARTIAL HOSPITALIZATION PROGRAM [place], [place], [address] Phone: [phone]   Fax:  [phone]  Name: Linda Buckley MRN: [id number] Date of Birth: November 21, 1956

## 2018-05-28 NOTE — Progress Notes (Signed)
Linda Buckley is a 62 y.o., married, unemployed, African American female; who was transitioned from [facility].  Pt reports she went inpt due to increased SI with plan of driving over/into a bridge. Pt shares she has dealt with depression for 40+ years but has always been able to "throw myself into things to distract me" including children, work, marriage. Pt reports she has been unable to work since 2015-03-23 due to physical issues and that has made her depression worse. Pt shares her older brother died in 22-Mar-2001 and still has a lot of grief about this. Pt reports she was renting-to-own a home and was 1 year away from taking over the mrtg when the owner gave them 45 days notice to move out in 09/2017.  Pt report her and her husband moved into an apartment "and a lot of college students live there and they are driving me crazy." Pt shares she experienced a lot of crying spells, wouldn't leave her bedroom, and was sleeping most of the day. Pt shares she started drinking wine and smoked a cigar on the Sunday prior to inpt "which should have been a sign. I don't smoke and I have not drank in 2 years." Pt reports she felt hopeless and worthless, which lead to SI. Pt reports she felt unable to share her feelings with her family "because they are really religious and I know the bible says that is the one sin you can't be forgiven for." Pt reports her family has been very supportive of her since her inpt stay. Pt shares she has no hx of tx prior to inpt stay. Pt denies SI/HI. Pt reports "I sometimes see things out of the corner of my eye and when I look it is not there. I sometimes hear a TV on in the next room when it is not." Pt denies any commands.  Reports second husband of seven yrs is very supportive, along with her adult sons.  One son has Schizophrenia and drinks (ETOH). Pt completed all forms.  Scored 11 on the burns. A:  Oriented pt.  Provided pt with an orientation folder.  Encouraged support groups.  Refer pt to a  psychiatrist and therapist.  R:  Pt receptive.        Carlis Abbott, RITA, M.Ed, CNA

## 2018-05-28 NOTE — Therapy (Deleted)
[place], [place], [address] Phone: [phone]   Fax:  [phone]  May 28, 2018    _0 @  Occupational Therapy Discharge Summary   Patient: Linda Buckley MRN: [id number] Date of Birth: 04-12-1956  Diagnosis: Severe episode of recurrent major depressive disorder, without psychotic features [facility])  Referring Provider: Ricky Ala, NP  OCCUPATIONAL THERAPY DISCHARGE SUMMARY  Visits from Start of Care: 6  Current functional level related to goals / functional outcomes: Independent    Remaining deficits: N/A. Pt is stepping down to IOP level of care for continued maintenance and learning of coping strategies to apply to daily life.   Education / Equipment: Pt educated on psychosocial and coping strategies to increase engagement in BADL/IADL routines when reintegrating into the community. Plan: Patient agrees to discharge.  Patient goals were met. Patient is being discharged due to meeting the stated rehab goals.  ?????        Sincerely,  Zenovia Jarred, MSOT, OTR/L  Z[place]ia Jarred, [place], [place], [address] Phone: [phone]   Fax:  [phone]  Patient: Linda Buckley MRN: [id number] Date of Birth: [birth year]

## 2018-05-28 NOTE — Progress Notes (Signed)
    Daily Group Progress Note  Program: IOP  Group Time: 9:00-12:00  Participation Level: Active  Behavioral Response: Appropriate  Type of Therapy:  Group Therapy  Summary of Progress: Pt.'s first day in group. Pt. Presented as talkative, smiled appropriately. Pt. Discussed her plans to have a massage, engage in activities with her grandchildren, and complete assessment for sleep problems. Pt. Discussed that a major struggle for her has been grief over the death of her brother 17 years ago. Pt. Participated in discussion about use of realistic self-talk to help manage depression.     Nancie Neas, LPC

## 2018-05-29 ENCOUNTER — Ambulatory Visit (HOSPITAL_COMMUNITY): Payer: Self-pay

## 2018-05-29 ENCOUNTER — Other Ambulatory Visit (HOSPITAL_COMMUNITY): Payer: BLUE CROSS/BLUE SHIELD | Admitting: Psychiatry

## 2018-05-29 ENCOUNTER — Other Ambulatory Visit (HOSPITAL_COMMUNITY): Payer: Self-pay

## 2018-05-29 ENCOUNTER — Telehealth (HOSPITAL_COMMUNITY): Payer: Self-pay | Admitting: Psychiatry

## 2018-05-29 ENCOUNTER — Encounter (HOSPITAL_COMMUNITY): Payer: Self-pay | Admitting: Family

## 2018-05-29 DIAGNOSIS — F332 Major depressive disorder, recurrent severe without psychotic features: Secondary | ICD-10-CM | POA: Diagnosis not present

## 2018-05-29 NOTE — Telephone Encounter (Signed)
D:  Pt continues to be struggling with the loss of her brother in 2002.  A:  Contacted [facility] to refer pt for grief counseling.  Left vm. R:  Pt receptive.

## 2018-05-29 NOTE — Progress Notes (Signed)
Psychiatric Initial Adult Assessment   Patient Identification: Linda Buckley MRN:  [id number] Date of Evaluation:  05/29/2018 Referral Source: Step down from [facility]  Chief Complaint:  Depression  Visit Diagnosis:    ICD-10-CM   1. Severe episode of recurrent major depressive disorder, without psychotic features ([place]) F33.2     History of Present Illness: Per [facility] assessment note: Linda Buckley 62 year old african [place] female present after inpatient admission: per inpt. Pt reports she went inpt due to increased SI with plan of driving over/into a bridge. Pt shares she has dealt with depression for 40+ years but has always been able to "throw myself into things to distract me" including children, work, marriage. Pt reports she has been unable to work since 04-05-15 due to physical issues and that has made her depression worse. Pt shares her older brother died in April 04, 2001 and still has a lot of grief about this. Pt reports she was renting-to-own a home and was 1 year away from taking over the mrtg when the owner gave them 45 days notice to move out in 09/2017.  Pt report her and her husband moved into an apartment "and a lot of college students live there and they are driving me crazy." Pt shares she experienced a lot of crying spells, wouldn't leave her bedroom, and was sleeping most of the day. Pt shares she started drinking wine and smoked a cigar on the Sunday prior to inpt "which should have been a sign. I don't smoke and I have not drank in 2 years." Pt reports she felt hopeless and worthless, which lead to SI. Pt reports she felt unable to share her feelings with her family "because they are really religious and I know the bible says that is the one sin you can't be forgiven for." Pt reports her family has been very supportive of her since her inpt stay. Pt shares she has no hx of tx prior to inpt stay. Pt reports SI is better and she does not want to kill herself because she is hopeful for treatment. Pt  reports "I sometimes see things out of the corner of my eye and when I look it is not there. I sometimes hear a TV on in the next room when it is not." Pt denies any commands. Pt denies HI.   On Evaluation:  [name] presents pleasant, cooperative.  Reports her depression symptoms have improved since her admission to the partial hospitalization program.  Reports learning new coping skills and has made lifestyle changes after attending partial hospitalization.  Reports her family has been supportive after opening up to them regarding her stressors.  Continues to report her depression is mild.  Patient  continues to ruminate with chronic pain due to her fibromyalgia and osteoarthritis.  Patient reports she is been better aware of her health issues and has made multiple follow-ups with her primary care provider and cardiologist.  Continues to report that she is taking her medications as prescribed and tolerating them well.  Denies medication side effects.  Rates her depression 5 out of 10 during this assessment.  Denies suicidal or homicidal ideation support encouragement reassurance was provided.   Associated Signs/Symptoms: Depression Symptoms:  depressed mood, insomnia, anxiety, (Hypo) Manic Symptoms:  Distractibility, Anxiety Symptoms:  Excessive Worry, Psychotic Symptoms:  Hallucinations: None PTSD Symptoms: NA  Past Psychiatric History: Previous inpatient admission reported.  Reports she has been followed by her primary care provider where she was prescribed Wellbutrin has been taking Wellbutrin  for the past 5 years for depression and anxiety.  Previous Psychotropic Medications: No   Substance Abuse History in the last 12 months:  No.  Consequences of Substance Abuse: NA  Past Medical History:  Past Medical History:  Diagnosis Date  . Allergy   . Anxiety   . Arthritis   . Carpal tunnel syndrome of right wrist    RECURRENT  . Depression   . Dysphagia 09/02/2017  . Edema 09/02/2017  .  Environmental allergies    dust mites, pollen, roaches and other insects, mold  . Fibromyalgia 04/2016  . Headache    migraines  . Hereditary and idiopathic peripheral neuropathy 04/03/2016  . History of adenomatous polyp of colon   . History of gastric ulcer   . History of kidney stones   . Hypertension   . Pneumonia   . Sleep apnea    wears CPAP  . Wears glasses     Past Surgical History:  Procedure Laterality Date  . CARPAL TUNNEL RELEASE Right 2007  . CARPAL TUNNEL RELEASE Right 07/05/2015   Procedure: RIGHT HAND REVISION CARPAL TUNNEL RELEASE;  Surgeon: Iran Planas, MD;  Location: [facility];  Service: Orthopedics;  Laterality: Right;  . CARPAL TUNNEL RELEASE Left 09/2016  . CERVICAL DISC ARTHROPLASTY  03/2015  . COLONOSCOPY W/ POLYPECTOMY  04-15-2014  . CYSTO/  RIGHT RETROGRADE PYELOGRAM/ URETEROSCOPY STONE EXTRACTION/  STENT PLACEMENT  06-07-2010  . LAPAROSCOPIC ASSISTED VAGINAL HYSTERECTOMY  04-25-2004   ovaries remain  . TUBAL LIGATION  1982    Family Psychiatric History:   Family History:  Family History  Problem Relation Age of Onset  . Asthma Father   . Diabetes Father   . Arthritis Mother   . Hypertension Mother   . Diabetes Mother   . Cancer Mother   . Stomach cancer Mother   . Diabetes Brother   . Diabetes Brother   . Asthma Son   . Alcohol abuse Son   . Anxiety disorder Son   . Schizophrenia Son   . Cancer Maternal Grandfather        colon  . Colon cancer Maternal Grandfather   . Cancer Maternal Aunt        breast  . Cancer Maternal Uncle        lung  . Colon cancer Maternal Uncle   . Cancer Maternal Grandmother        ovarian  . Diabetes Son     Social History:   Social History   Socioeconomic History  . Marital status: Married    Spouse name: Not on file  . Number of children: 2  . Years of education: 2 yrs college  . Highest education level: Not on file  Occupational History  . 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 Financial resource strain: Very hard  . Food insecurity:    Worry: Often true    Inability: Often true  . Transportation needs:    Medical: No    Non-medical: No  Tobacco Use  . Smoking status: Never Smoker  . Smokeless tobacco: Never Used  Substance and Sexual Activity  . Alcohol use: Not Currently    Comment: 2 glasses of wine weekly - reports none in 2 years  . Drug use: No    Comment: prescription for klonopin and oxy IR  . Sexual activity: Yes    Partners: Male    Birth control/protection: None    Comment: partial hysterectomy  Lifestyle  . Physical activity:    Days per week: 0 days    Minutes per session: 0 min  . Stress: Rather much  Relationships  . Social connections:    Talks on phone: More than three times a week    Gets together: Once a week    Attends religious service: More than 4 times per year    Active member of club or organization: Yes    Attends meetings of clubs or organizations: 1 to 4 times per year    Relationship status: Married  Other Topics Concern  . Not on file  Social History Narrative   Lives at home w/ her husband   Right-handed   Drinks 2 cups of coffee weekly    3 bottle water per day    Additional Social History:  Allergies:   Allergies  Allergen Reactions  . Aspirin Swelling    Sweating/ swelling of hands and face   . Bee Venom Swelling    Swelling at site     Metabolic Disorder Labs: Lab Results  Component Value Date   HGBA1C 6.0 11/05/2017   No results found for: PROLACTIN Lab Results  Component Value Date   CHOL 169 11/05/2017   TRIG 85.0 11/05/2017   HDL 49.40 11/05/2017   CHOLHDL 3 11/05/2017   VLDL 17.0 11/05/2017   LDLCALC 103 (H) 11/05/2017   LDLCALC 94 10/28/2016     Current Medications: Current Outpatient Medications  Medication Sig Dispense Refill  . beclomethasone (QVAR REDIHALER) 80 MCG/ACT inhaler Inhale 1 puff into the lungs 2 (two) times daily. 10.6 g 3  . buPROPion (WELLBUTRIN XL)  300 MG 24 hr tablet Take 1 tablet (300 mg total) by mouth daily. 30 tablet 0  . cetirizine (ZYRTEC) 10 MG tablet Take 1 tablet (10 mg total) by mouth daily. 30 tablet 11  . Cholecalciferol (VITAMIN D PO) Take 1 tablet by mouth daily. 1000units    . clonazePAM (KLONOPIN) 0.5 MG tablet TAKE 1 TABLET BY MOUTH AT BEDTIME 30 tablet 0  . diclofenac sodium (VOLTAREN) 1 % GEL APPLY 4 GRAMS EXTERNALLY TO THE AFFECTED AREA FOUR TIMES DAILY. 100 g 0  . DULoxetine (CYMBALTA) 60 MG capsule Take 1 capsule (60 mg total) by mouth 2 (two) times daily. 60 capsule 0  . EPINEPHrine (EPIPEN) 0.3 mg/0.3 mL SOAJ injection Inject 0.3 mg into the muscle once.    . furosemide (LASIX) 20 MG tablet TAKE 1 TABLET(20 MG) BY MOUTH DAILY 30 tablet 3  . losartan-hydrochlorothiazide (HYZAAR) 50-12.5 MG tablet TAKE 1 TABLET BY MOUTH EVERY DAY 90 tablet 0  . Oxycodone HCl 10 MG TABS TK 1 T PO Q 8 H PRN P  0  . pantoprazole (PROTONIX) 40 MG tablet Take 1 tablet (40 mg total) by mouth daily. 30 tablet 0  . pregabalin (LYRICA) 200 MG capsule Take 1 capsule (200 mg total) by mouth 2 (two) times daily. 180 capsule 1  . traZODone (DESYREL) 50 MG tablet Take 1 tablet (50 mg total) by mouth at bedtime as needed for sleep. 30 tablet 0  . VENTOLIN HFA 108 (90 Base) MCG/ACT inhaler INHALE 2 PUFFS BY MOUTH EVERY 4 HOURS AS NEEDED FOR SHORTNESS OF BREATH 36 g 0  . vitamin E 1000 UNIT capsule Take by mouth.     No current facility-administered medications for this visit.     Neurologic: Headache: No Seizure: No Paresthesias:No  Musculoskeletal: Strength & Muscle Tone: within normal limits Gait & Station: normal Patient  leans: N/A  Psychiatric Specialty Exam: ROS  There were no vitals taken for this visit.There is no height or weight on file to calculate BMI.  General Appearance: Casual  Eye Contact:  Fair  Speech:  Clear and Coherent  Volume:  Normal  Mood:  Anxious and Depressed  Affect:  Congruent  Thought Process:  Coherent   Orientation:  Full (Time, Place, and Person)  Thought Content:  Hallucinations: None  Suicidal Thoughts:  No  Homicidal Thoughts:  No  Memory:  Immediate;   Fair Recent;   Fair Remote;   Fair  Judgement:  Fair  Insight:  Fair  Psychomotor Activity:  Normal  Concentration:  Concentration: Fair  Recall:  [employer] of Knowledge:Fair  Language: Fair  Akathisia:  No  Handed:  Right  AIMS (if indicated):    Assets:  Communication Skills Desire for Improvement Resilience Social Support  ADL's:  Intact  Cognition: WNL  Sleep:      Treatment Plan Summary: Admit to intensive outpatient program IOP Medication management :  Continue Wellbutrin 300 mg, Klonopin 0.5 mg,  Cymbalta 60 mg, Trazodone 50 mg  for mood stabilization  Treatment plan was reviewed and agreed upon by NP T. Bobby Rumpf and patient Deneise Getty need for continued group services   Derrill Center, NP 6/14/20199:03 AM

## 2018-05-29 NOTE — Telephone Encounter (Signed)
Received a email from [place]  "This Patient has been saying her machine isn't working correctly, back and forth with Dr office saying she wears her machine every day. In [place] it shows that machine has not been worn ( NO DATA) or has not been plugged in for over a month..Told pt she would need to go in for a PAP Nap at the Dr office for 4 hrs or could come to our office for a 30-55min nap/eval. Pt decided to come to office and was scheduled for 2 pm 05/28/18, to let pt wear machine and see if it was transmitting ( keeping up with Data), if it wasn't we would do platinum replacement while she was here. Patient was a no show for the appointment, did not call to cancel or reschedule."

## 2018-06-01 ENCOUNTER — Other Ambulatory Visit (HOSPITAL_COMMUNITY): Payer: Self-pay

## 2018-06-01 ENCOUNTER — Ambulatory Visit (HOSPITAL_COMMUNITY): Payer: Self-pay

## 2018-06-01 ENCOUNTER — Telehealth: Payer: Self-pay | Admitting: *Deleted

## 2018-06-01 ENCOUNTER — Other Ambulatory Visit (HOSPITAL_COMMUNITY): Payer: BLUE CROSS/BLUE SHIELD | Admitting: Psychiatry

## 2018-06-01 DIAGNOSIS — F332 Major depressive disorder, recurrent severe without psychotic features: Secondary | ICD-10-CM

## 2018-06-01 NOTE — Telephone Encounter (Signed)
Pt will need appt to discuss.  

## 2018-06-01 NOTE — Progress Notes (Signed)
    Daily Group Progress Note  Program: IOP  Group Time: 9:00-12:00  Participation Level: Active  Behavioral Response: Appropriate  Type of Therapy:  Group Therapy  Summary of Progress: Pt. Presented as distracted and sad. Pt. Shared with the group that she went to family reunion with her sister, but was late for the event because she was very slow getting ready and conflicted about going. Pt. Discussed pressure to answer questions about her health to her family. Pt participated in discussion about resisting pressure to people please. Pt. Participated in medication management education group with the pharmacist.    Nancie Neas, [facility]

## 2018-06-01 NOTE — Telephone Encounter (Signed)
Spoke to pt and relayed that if doing ok can see her for both cpap and fibromyalgia in 09-07-18.  Pt was ok to wait and see how she goes. (stated at nervous breakdown last month) and ? About taking another medication vs lyrica.  She could not recall the name at this time, but was ok to wait until 09-07-18 at 1100 and see [name] then.  Will call back sooner if problems.

## 2018-06-01 NOTE — Progress Notes (Signed)
    Daily Group Progress Note  Program: IOP  Group Time: 9:00-12:00  Participation Level: Active  Behavioral Response: Appropriate  Type of Therapy:  Group Therapy  Summary of Progress: Pt. Presented with bright affect, talkative,engaged in the group process. Pt. Discussed her plans for the weekend to go to a family reunion. Pt. Discussed her fears of being perceived differently by her family and handling questions about physical changes and her recovery process. Pt. Participated in discussion about use of the grounding series for the treatment of depression. Pt. Participated in presentation by the [facility] police department about staying safe and avoiding financial scams.      Nancie Neas, LPC

## 2018-06-01 NOTE — Telephone Encounter (Signed)
Pt returning RN's call.

## 2018-06-01 NOTE — Telephone Encounter (Signed)
LMOVM for pt to call back and schedule appt. 30 min office visit, per provider request.

## 2018-06-01 NOTE — Telephone Encounter (Signed)
Can we schedule pt an office visit 32min if possible. [place] for Hospital follow up spot if necessary.

## 2018-06-01 NOTE — Telephone Encounter (Signed)
I LMVM for pt to return call re: appt.

## 2018-06-02 ENCOUNTER — Ambulatory Visit: Payer: BLUE CROSS/BLUE SHIELD | Admitting: Adult Health

## 2018-06-02 ENCOUNTER — Ambulatory Visit (HOSPITAL_COMMUNITY): Payer: Self-pay

## 2018-06-03 ENCOUNTER — Other Ambulatory Visit (HOSPITAL_COMMUNITY): Payer: BLUE CROSS/BLUE SHIELD | Admitting: Psychiatry

## 2018-06-03 ENCOUNTER — Ambulatory Visit
Admission: RE | Admit: 2018-06-03 | Discharge: 2018-06-03 | Disposition: A | Discharge status: Home / Self Care | Payer: BLUE CROSS/BLUE SHIELD | Source: Ambulatory Visit | Attending: Family Medicine | Admitting: Family Medicine

## 2018-06-03 DIAGNOSIS — F332 Major depressive disorder, recurrent severe without psychotic features: Secondary | ICD-10-CM | POA: Diagnosis not present

## 2018-06-03 DIAGNOSIS — Z1231 Encounter for screening mammogram for malignant neoplasm of breast: Secondary | ICD-10-CM

## 2018-06-03 NOTE — Progress Notes (Signed)
    Daily Group Progress Note  Program: IOP  Group Time: 9:00-12:00  Participation Level: Active  Behavioral Response: Appropriate  Type of Therapy:  Group Therapy  Summary of Progress: Pt. Presented as talkative, engaged in the group process. Pt. Reported that she was having a good day. Pt. Discussed her experience of learning to accept help from her family and accepting the changes that have occurred to her health and her body. Pt. Participated in discussion about learning to say "No" and developing healthy interpersonal boundaries. Pt. Participated in wellness session facilitated by Frederich Balding; developing proper nutrition, regular exercise and sleep habits were discussed.      Nancie Neas, LPC

## 2018-06-04 ENCOUNTER — Ambulatory Visit (HOSPITAL_COMMUNITY): Payer: Self-pay

## 2018-06-04 ENCOUNTER — Other Ambulatory Visit (HOSPITAL_COMMUNITY): Payer: Self-pay

## 2018-06-04 ENCOUNTER — Other Ambulatory Visit (HOSPITAL_COMMUNITY): Payer: BLUE CROSS/BLUE SHIELD | Admitting: Family

## 2018-06-04 DIAGNOSIS — F332 Major depressive disorder, recurrent severe without psychotic features: Secondary | ICD-10-CM

## 2018-06-05 ENCOUNTER — Ambulatory Visit (HOSPITAL_COMMUNITY): Payer: Self-pay

## 2018-06-05 ENCOUNTER — Other Ambulatory Visit (HOSPITAL_COMMUNITY): Payer: Self-pay

## 2018-06-05 NOTE — Progress Notes (Signed)
    Daily Group Progress Note  Program: IOP  Group Time: 9:00-12:00  Participation Level: Active  Behavioral Response: Appropriate  Type of Therapy:  Group Therapy  Summary of Progress: Pt. Presented with bright affect. Pt. Shared with the group that she was hurting because she overextended herself at the gym. Pt. Discussed with therapist strategies for listening to her body and pacing herself. Pt. Participated in discussion about addressing barriers to meeting goals and the [place].     Nancie Neas, LPC

## 2018-06-06 ENCOUNTER — Other Ambulatory Visit: Payer: Self-pay | Admitting: Family Medicine

## 2018-06-08 ENCOUNTER — Ambulatory Visit (HOSPITAL_COMMUNITY): Payer: Self-pay

## 2018-06-08 ENCOUNTER — Other Ambulatory Visit (HOSPITAL_COMMUNITY): Payer: Self-pay

## 2018-06-08 ENCOUNTER — Other Ambulatory Visit (HOSPITAL_COMMUNITY): Payer: BLUE CROSS/BLUE SHIELD | Admitting: Psychiatry

## 2018-06-08 DIAGNOSIS — F332 Major depressive disorder, recurrent severe without psychotic features: Secondary | ICD-10-CM | POA: Diagnosis not present

## 2018-06-08 MED ORDER — TRAZODONE HCL 50 MG PO TABS: 50.0000 mg | ORAL_TABLET | Freq: Every evening | ORAL | 0 refills | Status: DC | PRN

## 2018-06-08 NOTE — Addendum Note (Signed)
Addended by: Derrill Center on: 06/08/2018 10:04 AM   Modules accepted: Orders

## 2018-06-09 ENCOUNTER — Other Ambulatory Visit (HOSPITAL_COMMUNITY): Payer: BLUE CROSS/BLUE SHIELD

## 2018-06-09 ENCOUNTER — Other Ambulatory Visit (HOSPITAL_COMMUNITY): Payer: Self-pay

## 2018-06-09 ENCOUNTER — Ambulatory Visit (HOSPITAL_COMMUNITY): Payer: Self-pay

## 2018-06-09 NOTE — Progress Notes (Addendum)
    Daily Group Progress Note  Program: IOP Time: 9:00-12:00  Type of Therapy:  Group Therapy   Participation Level:  Active   Participation Quality:  Appropriate   Affect:  Appropriate   Cognitive:  Appropriate   Insight:  Appropriate   Engagement in Group:  Engaged   Modes of Intervention:  Problem-solving  Linda Buckley  is a 62 y.o. is a female that presented to Psych IOP. Counselor utilized motivation interviewing skills and group processing as a means for therapeutic intervention.  Pt has an hx of depression x40 years and history of suicidal thoughts. She transitioned from [facility] to Psych IOP.  She reports mild depression today, "feeling down about my physical health and not being able to contribute to the household". She shared with the group that her spouse takes care of all the household finances and she feels bad that he has this burden all on him.    This first and second hour of today's session focused on "Mind Set Matters" with focus on changing a "Fixed Mindset" to a "Growth Mindset".  Counselor elicited examples/feedback from patient and other group members to share experiences that are associated with moving toward a growth. Counselor provided a handout to provided examples. Clients were then asked to practice Mindset Matters" with each other. Client shared an example, "I made a mistake but mistakes help me learn". Counselor and group praised [place] for her motivation towards self-improvement.  Pt is dressed in street clothes, alert, oriented x4 with normal speech and normal motor behavior. Eye contact is fair. Pt's mood is appropriate and affect is congruent with mood. Thought process is coherent and relevant. Pt's insight is fair and judgement is fair. There is no indication that patient  is currently responding to internal stimuli or experiencing delusional thought content. Pt was cooperative throughout assessment and relevant. Pt's insight is fair and judgement is fair. Pt  was cooperative throughout the group session on this day.   Nancie Neas, LPC

## 2018-06-10 ENCOUNTER — Other Ambulatory Visit (HOSPITAL_COMMUNITY): Payer: BLUE CROSS/BLUE SHIELD | Admitting: Psychiatry

## 2018-06-10 ENCOUNTER — Other Ambulatory Visit (HOSPITAL_COMMUNITY): Payer: Self-pay

## 2018-06-10 DIAGNOSIS — F332 Major depressive disorder, recurrent severe without psychotic features: Secondary | ICD-10-CM

## 2018-06-10 NOTE — Progress Notes (Unsigned)
    Daily Group Progress Note  Program: {CHL AMB BH IOP/CDIOP Program Type:21022744}  Group Time:   Participation Level: {CHL AMB BH Group Participation:21022742}  Behavioral Response: {CHL AMB BH Group Behavior:21022743}  Type of Therapy:  {CHL AMB BH Type of Therapy:21022741}  Summary of Progress: ***     Group Time:   Participation Level:  {CHL AMB BH Group Participation:21022742}  Behavioral Response: {CHL AMB BH Group Behavior:21022743}  Type of Therapy: {CHL AMB BH Type of Therapy:21022741}  Summary of Progress: ***  BH-PIOPB PSYCH

## 2018-06-11 ENCOUNTER — Other Ambulatory Visit (HOSPITAL_COMMUNITY): Payer: Self-pay

## 2018-06-11 ENCOUNTER — Ambulatory Visit (HOSPITAL_COMMUNITY): Payer: Self-pay

## 2018-06-11 ENCOUNTER — Other Ambulatory Visit (HOSPITAL_COMMUNITY): Payer: BLUE CROSS/BLUE SHIELD | Admitting: Psychiatry

## 2018-06-11 DIAGNOSIS — F332 Major depressive disorder, recurrent severe without psychotic features: Secondary | ICD-10-CM

## 2018-06-11 NOTE — Progress Notes (Signed)
    Daily Group Progress Note  Program: IOP  Group Time: 9:00-12:00  Participation Level: Active  Behavioral Response: Appropriate  Type of Therapy:  Group Therapy  Summary of Progress: Pt. Presented with bright affect, talkative, engaged in the group process. Pt. Discussed that she over-exerted herself yesterday because she was feeling good, and as a result she was hurting and experiencing a fibromialgia flair up today. Therapist provided feedback about how Pt. Can begin pacing herself so that she is less tired and able to sustain her energy and prevent body pain. Pt. Participated in discussion about questions to ask yourself before you give up on your depression. Pt. Participated in discussion facilitated by the mental health association.    Nancie Neas, LPC

## 2018-06-12 ENCOUNTER — Other Ambulatory Visit (HOSPITAL_COMMUNITY): Payer: Self-pay

## 2018-06-12 ENCOUNTER — Ambulatory Visit (HOSPITAL_COMMUNITY): Payer: Self-pay

## 2018-06-12 ENCOUNTER — Other Ambulatory Visit (HOSPITAL_COMMUNITY): Payer: BLUE CROSS/BLUE SHIELD | Admitting: Psychiatry

## 2018-06-12 DIAGNOSIS — F332 Major depressive disorder, recurrent severe without psychotic features: Secondary | ICD-10-CM | POA: Diagnosis not present

## 2018-06-15 ENCOUNTER — Other Ambulatory Visit (HOSPITAL_COMMUNITY): Payer: BLUE CROSS/BLUE SHIELD | Attending: Psychiatry

## 2018-06-15 DIAGNOSIS — F332 Major depressive disorder, recurrent severe without psychotic features: Secondary | ICD-10-CM | POA: Insufficient documentation

## 2018-06-15 DIAGNOSIS — R45851 Suicidal ideations: Secondary | ICD-10-CM | POA: Insufficient documentation

## 2018-06-16 ENCOUNTER — Encounter (HOSPITAL_COMMUNITY): Payer: Self-pay | Admitting: Family

## 2018-06-16 ENCOUNTER — Other Ambulatory Visit (HOSPITAL_COMMUNITY): Payer: BLUE CROSS/BLUE SHIELD | Admitting: Psychiatry

## 2018-06-16 DIAGNOSIS — F332 Major depressive disorder, recurrent severe without psychotic features: Secondary | ICD-10-CM | POA: Diagnosis not present

## 2018-06-16 DIAGNOSIS — R45851 Suicidal ideations: Secondary | ICD-10-CM | POA: Diagnosis not present

## 2018-06-16 MED ORDER — TRAZODONE HCL 50 MG PO TABS: 50.0000 mg | ORAL_TABLET | Freq: Every evening | ORAL | 0 refills | Status: DC | PRN

## 2018-06-16 MED ORDER — BUPROPION HCL ER (XL) 300 MG PO TB24: 300.0000 mg | ORAL_TABLET | Freq: Every day | ORAL | 0 refills | Status: DC

## 2018-06-16 NOTE — Progress Notes (Signed)
    Daily Group Progress Note  Program: IOP  Group Time: 9:00-12:00  Participation Level: Active  Behavioral Response: Appropriate  Type of Therapy:  Group Therapy  Summary of Progress: Pt. Presented with bright affect, talkative, engaged in the group process. Pt. Provided feedback to other female members of the group about being a single mother, learning to prioritize self-care as a single mother, and setting healthy boundaries with family members and significant others. Pt. Participated in guided meditation exercise and review of the grounding series with focus on 4-3-8 breathing to manage stress and anxiety. Pt. Discussed her history of pain management and learning to pace herself in order to manage her pain. Pt. Discussed that yesterday had been a much better day  And she was in less pain today.     Nancie Neas, LPC

## 2018-06-16 NOTE — Progress Notes (Signed)
  [facility] Intensive Outpatient Program Discharge Summary  Linda Buckley [id number]  Admission date: 06/08/2018 Discharge date: 06/16/2018  Reason for admission: Worsening depression with suicidal ideations  Per assessment note: presents to [facility] for [facility] per inpt. Pt reports she went inpt due to increased SI with plan of driving over/into a bridge. Pt shares she has dealt with depression for 40+ years but has always been able to "throw myself into things to distract me" including children, work, marriage. Pt reports she has been unable to work since 23-Mar-2015 due to physical issues and that has made her depression worse. Pt shares her older brother died in 22-Mar-2001 and still has a lot of grief about this. Pt reports she was renting-to-own a home and was 1 year away from taking over the mrtg when the owner gave them 45 days notice to move out in 09/2017.  Pt report her and her husband moved into an apartment "and a lot of college students live there and they are driving me crazy." Pt shares she experienced a lot of crying spells, wouldn't leave her bedroom, and was sleeping most of the day. Pt shares she started drinking wine and smoked a cigar on the Sunday prior to inpt "which should have been a sign. I don't smoke and I have not drank in 2 years." Pt reports she felt hopeless and worthless, which lead to SI.    Chemical Use History: Occasional EtOH use.  Family of Origin Issues: Reports her husband and family have been supportive since her admission.  States she is now open and receptive to discussing her feelings with her family and friends without the fear of being judged.  Progress in Program Toward Treatment Goals: Nai attended and participated in groups sessions.  Patient transition from partial hospitalization program to intensive outpatient program with much success.  Reports taking medication as prescribed and tolerating them well.  Patient continues to have concerns with chronic  pain however has follow-up appointment with primary care providers.  Progress (rationale): Ongoing, Follow-up with Charolotte Eke 07/16/2018 and 15:00 and Dr Adele Schilder 08/04/2018@ 13:00. medications was refilled at discharge.  Take all medications as prescribed. Keep all follow-up appointments as scheduled.  Do not consume alcohol or use illegal drugs while on prescription medications. Report any adverse effects from your medications to your primary care provider promptly.  In the event of recurrent symptoms or worsening symptoms, call 911, a crisis hotline, or go to the nearest emergency department for evaluation.   Derrill Center, NP 06/16/2018

## 2018-06-16 NOTE — Patient Instructions (Signed)
D:  Patient successfully completed MH-IOP today.  A:  Follow up with Binnie Rail, LCAS on 07-16-18 @ 2pm and Dr. Adele Schilder on 08-04-18 @ 1pm.  Encouraged support groups.  If interested in [facility], please call Binnie Rail, [place] @ [phone].  R:  Patient receptive.

## 2018-06-16 NOTE — Progress Notes (Signed)
Linda Buckley is a 62 y.o., married, unemployed, African American female; who was transitioned from [facility].  Pt reports she went inpt due to increased SI with plan of driving over/into a bridge. Pt shares she has dealt with depression for 40+ years but has always been able to "throw myself into things to distract me" including children, work, marriage. Pt reports she has been unable to work since March 30, 2015 due to physical issues and that has made her depression worse. Pt shares her older brother died in 29-Mar-2001 and still has a lot of grief about this. Pt reports she was renting-to-own a home and was 1 year away from taking over the mrtg when the owner gave them 45 days notice to move out in 09/2017. Pt report her and her husband moved into an apartment "and a lot of college students live there and they are driving me crazy." Pt shares she experienced a lot of crying spells, wouldn't leave her bedroom, and was sleeping most of the day. Pt shares she started drinking wine and smoked a cigar on the Sunday prior to inpt "which should have been a sign. I don't smoke and I have not drank in 2 years." Pt reports she felt hopeless and worthless, which lead to SI. Pt reports she felt unable to share her feelings with her family "because they are really religious and I know the bible says that is the one sin you can't be forgiven for." Pt reports her family has been very supportive of her since her inpt stay. Pt shares she has no hx of tx prior to inpt stay. Pt denies SI/HI. Pt reports "I sometimes see things out of the corner of my eye and when I look it is not there. I sometimes hear a TV on in the next room when it is not." Pt denies any commands.  Reports second husband of seven yrs is very supportive, along with her adult sons.  One son has Schizophrenia and drinks (ETOH). Pt reports overall mood improved.  States she's less depressed.  Still struggles with concentration.   Denies SI/HI or A/V hallucinations. Pt scored 11 on  the admission burns depression checklist and 5 upon discharge.  A:  D/C today.  F/U with Binnie Rail, LCAS on 07-16-18 @ 2pm and Dr. Adele Schilder on 08-04-18 @ 1 pm.  Encouraged support groups.  Recommended [facility] with Binnie Rail, LCAS.  R:  Pt receptive.       Carlis Abbott, RITA, M.Ed, CNA

## 2018-06-17 ENCOUNTER — Other Ambulatory Visit (HOSPITAL_COMMUNITY): Payer: Self-pay | Admitting: Family

## 2018-06-17 ENCOUNTER — Other Ambulatory Visit: Payer: Self-pay

## 2018-06-17 ENCOUNTER — Ambulatory Visit (INDEPENDENT_AMBULATORY_CARE_PROVIDER_SITE_OTHER): Payer: BLUE CROSS/BLUE SHIELD | Admitting: Family Medicine

## 2018-06-17 ENCOUNTER — Encounter: Payer: Self-pay | Admitting: Family Medicine

## 2018-06-17 VITALS — BP 112/68 | HR 75 | Temp 98.6°F | Resp 17 | Ht 66.0 in | Wt 259.0 lb

## 2018-06-17 DIAGNOSIS — M1811 Unilateral primary osteoarthritis of first carpometacarpal joint, right hand: Secondary | ICD-10-CM | POA: Diagnosis not present

## 2018-06-17 DIAGNOSIS — R600 Localized edema: Secondary | ICD-10-CM | POA: Diagnosis not present

## 2018-06-17 DIAGNOSIS — F332 Major depressive disorder, recurrent severe without psychotic features: Secondary | ICD-10-CM | POA: Diagnosis not present

## 2018-06-17 LAB — HEPATIC FUNCTION PANEL
ALT: 12 U/L (ref 0–35)
AST: 12 U/L (ref 0–37)
Albumin: 4.1 g/dL (ref 3.5–5.2)
Alkaline Phosphatase: 75 U/L (ref 39–117)
BILIRUBIN DIRECT: 0.1 mg/dL (ref 0.0–0.3)
BILIRUBIN TOTAL: 0.4 mg/dL (ref 0.2–1.2)
TOTAL PROTEIN: 6.5 g/dL (ref 6.0–8.3)

## 2018-06-17 LAB — CBC WITH DIFFERENTIAL/PLATELET
BASOS ABS: 0 10*3/uL (ref 0.0–0.1)
BASOS PCT: 0.5 % (ref 0.0–3.0)
EOS ABS: 0.1 10*3/uL (ref 0.0–0.7)
Eosinophils Relative: 3.6 % (ref 0.0–5.0)
HEMATOCRIT: 37.1 % (ref 36.0–46.0)
Hemoglobin: 12.2 g/dL (ref 12.0–15.0)
LYMPHS PCT: 25 % (ref 12.0–46.0)
Lymphs Abs: 1 10*3/uL (ref 0.7–4.0)
MCHC: 33 g/dL (ref 30.0–36.0)
MCV: 90.1 fl (ref 78.0–100.0)
MONO ABS: 0.3 10*3/uL (ref 0.1–1.0)
Monocytes Relative: 8.6 % (ref 3.0–12.0)
NEUTROS ABS: 2.5 10*3/uL (ref 1.4–7.7)
NEUTROS PCT: 62.3 % (ref 43.0–77.0)
PLATELETS: 229 10*3/uL (ref 150.0–400.0)
RBC: 4.11 Mil/uL (ref 3.87–5.11)
RDW: 14.2 % (ref 11.5–15.5)
WBC: 4.1 10*3/uL (ref 4.0–10.5)

## 2018-06-17 LAB — LIPID PANEL
CHOLESTEROL: 183 mg/dL (ref 0–200)
HDL: 51.1 mg/dL (ref >39.00)
LDL Cholesterol: 97 mg/dL (ref 0–99)
NonHDL: 132.23
Total CHOL/HDL Ratio: 4
Triglycerides: 175 mg/dL — ABNORMAL HIGH (ref 0.0–149.0)
VLDL: 35 mg/dL (ref 0.0–40.0)

## 2018-06-17 LAB — BASIC METABOLIC PANEL
BUN: 14 mg/dL (ref 6–23)
CALCIUM: 9.6 mg/dL (ref 8.4–10.5)
CHLORIDE: 103 meq/L (ref 96–112)
CO2: 31 meq/L (ref 19–32)
CREATININE: 0.9 mg/dL (ref 0.40–1.20)
GFR: 82.22 mL/min (ref >60.00)
Glucose, Bld: 108 mg/dL — ABNORMAL HIGH (ref 70–99)
Potassium: 4.4 mEq/L (ref 3.5–5.1)
SODIUM: 142 meq/L (ref 135–145)

## 2018-06-17 LAB — TSH: TSH: 0.53 u[IU]/mL (ref 0.35–4.50)

## 2018-06-17 MED ORDER — FUROSEMIDE 40 MG PO TABS: 40.0000 mg | ORAL_TABLET | Freq: Every day | ORAL | 3 refills | Status: DC

## 2018-06-17 MED ORDER — DICLOFENAC SODIUM 1 % TD GEL: TRANSDERMAL | 3 refills | Status: DC

## 2018-06-17 MED ORDER — CLONAZEPAM 0.5 MG PO TABS: 0.5000 mg | ORAL_TABLET | Freq: Every day | ORAL | 3 refills | Status: DC

## 2018-06-17 MED ORDER — EPINEPHRINE 0.3 MG/0.3ML IJ SOAJ: 0.3000 mg | Freq: Once | INTRAMUSCULAR | 0 refills | Status: AC

## 2018-06-17 NOTE — Assessment & Plan Note (Signed)
Ongoing issue.  Pt has lost 7 lbs since last visit.  Applauded her efforts.  Her weight is currently what is most distressing to her.  Based on this, will refer to [place] at pt's request.  Check labs to risk stratify.  Will follow.

## 2018-06-17 NOTE — Patient Instructions (Signed)
Schedule your complete physical in 6 months We'll notify you of your lab results and make any changes if needed Continue to work on healthy diet and regular exercise- you can do it! Increase your Furosemide (Lasix) to 40mg  daily to help w/ swelling We'll call you with your Hand appt Call with any questions or concerns I'm SO proud of you! Happy 4th!!!

## 2018-06-17 NOTE — Progress Notes (Signed)
   Subjective:    Patient ID: Linda Buckley, female    DOB: Dec 11, 1956, 62 y.o.   MRN: [id number]  HPI Depression- pt was admitted to a week at [facility] and then completed a month of IOP therapy.  Plan is to continue counseling once weekly and volunteer.  Currently on Cymbalta and Wellbutrin.  Pt is appreciative to [name] for sending her to behavioral.  Obesity- ongoing issue.  Pt is down 7 lbs since 6/6.  She quit sodas ~1 month ago.  Pt is trying to exercise for both weight loss and fibromyalgia pain.  Pt's biggest concern is weight loss.  'I can't carry the weight w/ my fibromyalgia'.  Edema- pt reports improvement w/ Lasix 20mg  but continues to have intermittent LE edema.  No CP, SOB.  R CMC joint pain- pt needs a 2nd opinion from office that is NOT Dr Fredna Dow.   Review of Systems For ROS see HPI     Objective:   Physical Exam  Constitutional: She is oriented to person, place, and time. She appears well-developed and well-nourished. No distress.  Morbidly obese  HENT:  Head: Normocephalic and atraumatic.  Eyes: Pupils are equal, round, and reactive to light. Conjunctivae and EOM are normal.  Neck: Normal range of motion. Neck supple. No thyromegaly present.  Cardiovascular: Normal rate, regular rhythm, normal heart sounds and intact distal pulses.  No murmur heard. Pulmonary/Chest: Effort normal and breath sounds normal. No respiratory distress.  Abdominal: Soft. She exhibits no distension. There is no tenderness.  Musculoskeletal: She exhibits edema (trace edema of feet bilaterally).  Enlarged R CMC joint  Lymphadenopathy:    She has no cervical adenopathy.  Neurological: She is alert and oriented to person, place, and time.  Skin: Skin is warm and dry.  Psychiatric: She has a normal mood and affect. Her behavior is normal.  Vitals reviewed.         Assessment & Plan:  Edema- ongoing issue.  Pt reports some improvement w/ Lasix 20mg  but she continues to have  intermittent swelling.  Increase Lasix to 40mg  PRN.  Pt expressed understanding and is in agreement w/ plan.   CMC pain- ongoing issue.  Refer to new hand specialist at pt's request.

## 2018-06-17 NOTE — Progress Notes (Signed)
    Daily Group Progress Note  Program: IOP  Group Time: 9:00-12:00  Participation Level: Active  Behavioral Response: Appropriate  Type of Therapy:  Group Therapy  Summary of Progress: Pt. Presented with bright affect, engaged in the group process. Pt. Prepared for discharge with the case manager and NP. Pt. Discussed that she had made progress processing her grief and accepting of her physical limitations and processing her sadness. Pt. Participated in discussion about acceptance of others in relationships, honoring our feelings, and asking for what we need in relationship.     Nancie Neas, LPC

## 2018-06-17 NOTE — Assessment & Plan Note (Signed)
Much improved.  Pt has completed inpt tx and IOP.  She is now seeing her counselor weekly and feeling much better.  Will continue to follow and assist as able.

## 2018-06-19 ENCOUNTER — Other Ambulatory Visit (HOSPITAL_COMMUNITY): Payer: BLUE CROSS/BLUE SHIELD

## 2018-06-23 NOTE — Progress Notes (Unsigned)
    Daily Group Progress Note  Program: IOP Time: 9:00-12:00  Type of Therapy:  Group Therapy   Participation Level:  Active   Participation Quality:  Appropriate   Affect:  Appropriate   Cognitive:  Appropriate   Insight:  Appropriate   Engagement in Group:  Engaged   Modes of Intervention:  Problem-solving Linda Buckley is a female that presented to Psych IOP. Counselor utilized motivation interviewing skills and group processing as a means for therapeutic intervention.  Pt has an hx of depression and anxiety. She reports that she is feeling depressed today and has extreme anxiety. Patient did not identify any specific stressors at this time This first and second hour of the group was focused on "Self-Care". Clients were provided a Radio producer". This assessment tool provided an overview of effective strategies to maintain self-care. After completing the full assessment, [name] was asked to choose one item from each area to actively work to improve.  Counselor challenged patient to consider how self-care if neglected may affect the body, mind, and spirit, leaving a feeling of depletion and out of balance. For this reason, it is important to have self-care strategies that address these parts of ourselves. Counselor elicited feedback from client and other members on how to develop effective self-care strategies. The last hour was an invited guest speaker to discuss the science of aromatherapy and it's benefits to promote health and well being.   Pt is dressed in street clothes, alert, oriented x4 with normal speech and normal motor behavior. Eye contact is fair. Pt's mood is normal and affect is congruent with mood. Thought process is coherent and relevant. Pt's insight is fair and judgement is fair. There is no indication Pt is currently responding to internal stimuli or experiencing delusional thought content. Pt was cooperative throughout  assessment.                           Nancie Neas, LPC

## 2018-06-30 ENCOUNTER — Telehealth: Payer: Self-pay | Admitting: Emergency Medicine

## 2018-06-30 NOTE — Telephone Encounter (Signed)
Copied from [place] [id number]. Topic: Referral - Request >> Jun 30, 2018  3:19 PM Mylinda Latina, NT wrote: Reason for CRM: Bethena Roys calling from [place] stating she is needing the demographic, last OV ,and the referral information ( what the patient is needing to be seen for) .Fax# [phone]

## 2018-07-03 ENCOUNTER — Other Ambulatory Visit: Payer: Self-pay | Admitting: Family Medicine

## 2018-07-03 ENCOUNTER — Telehealth: Payer: Self-pay | Admitting: Neurology

## 2018-07-05 ENCOUNTER — Other Ambulatory Visit (HOSPITAL_COMMUNITY): Payer: Self-pay | Admitting: Family

## 2018-07-06 ENCOUNTER — Other Ambulatory Visit: Payer: Self-pay | Admitting: Family Medicine

## 2018-07-08 MED ORDER — PREGABALIN 200 MG PO CAPS: 200.0000 mg | ORAL_CAPSULE | Freq: Two times a day (BID) | ORAL | 0 refills | Status: DC

## 2018-07-14 ENCOUNTER — Other Ambulatory Visit (HOSPITAL_COMMUNITY): Payer: Self-pay | Admitting: Family

## 2018-07-14 MED ORDER — PREGABALIN 200 MG PO CAPS: 200.0000 mg | ORAL_CAPSULE | Freq: Two times a day (BID) | ORAL | 0 refills | Status: DC

## 2018-07-14 NOTE — Telephone Encounter (Signed)
[place]/[facility] [phone] needs script escribed again, there was a glitch and they don't have it.

## 2018-07-14 NOTE — Telephone Encounter (Signed)
The prescription for Lyrica was sent in again.

## 2018-07-14 NOTE — Telephone Encounter (Signed)
Dr. Jannifer Franklin- can you escribe again? See message below, thank you

## 2018-07-14 NOTE — Addendum Note (Signed)
Addended by: Kathrynn Ducking on: 07/14/2018 06:05 PM   Modules accepted: Orders

## 2018-07-16 ENCOUNTER — Encounter (HOSPITAL_COMMUNITY): Payer: Self-pay | Admitting: Licensed Clinical Social Worker

## 2018-07-16 ENCOUNTER — Ambulatory Visit (INDEPENDENT_AMBULATORY_CARE_PROVIDER_SITE_OTHER): Payer: BLUE CROSS/BLUE SHIELD | Admitting: Licensed Clinical Social Worker

## 2018-07-16 DIAGNOSIS — F332 Major depressive disorder, recurrent severe without psychotic features: Secondary | ICD-10-CM | POA: Diagnosis not present

## 2018-07-16 NOTE — Progress Notes (Addendum)
   THERAPIST PROGRESS NOTE  Session Time: 1:00-1:30  Participation Level: Active  Behavioral Response: CasualAlert/In pain  Type of Therapy: Individual Therapy  Treatment Goals addressed: Coping  Interventions: Motivational Interviewing  Summary: Linda Buckley is a 62 y.o. female who presents for her initial individual counseling session since completion of IOP. Spent a considerable amount of time building a trusting therapeutic relationship and gaining background information. Pt has an upcoming appt with Dr. Adele Schilder. Pt has fiber myalgia, neuropathy and back pain,and has a neurologist who prescribes lyrica and cymbalta. Pt takes wellbutrin and klonopin which was prescribed by PA in IOP. She feels these are working well. Pt lives with her husband who is minimally supportive, she feels like they need to laugh more. Her greatest support is one of her sons ([id number]) who lives in [place] and her sister who lives in [place]. Her other son who lives in [place] is a schizophrenic.She is his support person even though they don't live together. Pt completed the GAD7 &PHQ9 in session.  Suicidal/Homicidal: Nowithout intent/plan  Therapist Response:Assessed pt's current functioning and reviewed progress. Assisted pt building a trusting therapeutic relationship. Assisted pt processing the management of her stressors.  Plan: Return again in 2 weeks.  Diagnosis: Axis I: Severe Episode of MDD without psychotic features    Kamaya Keckler S, LCAS 07/16/2018

## 2018-07-21 ENCOUNTER — Ambulatory Visit (HOSPITAL_COMMUNITY): Payer: BLUE CROSS/BLUE SHIELD | Admitting: Licensed Clinical Social Worker

## 2018-08-02 ENCOUNTER — Other Ambulatory Visit: Payer: Self-pay | Admitting: Family Medicine

## 2018-08-03 ENCOUNTER — Other Ambulatory Visit: Payer: Self-pay | Admitting: Orthopedic Surgery

## 2018-08-04 ENCOUNTER — Encounter (HOSPITAL_BASED_OUTPATIENT_CLINIC_OR_DEPARTMENT_OTHER)
Admission: RE | Admit: 2018-08-04 | Discharge: 2018-08-04 | Disposition: A | Discharge status: Home / Self Care | Payer: BLUE CROSS/BLUE SHIELD | Source: Ambulatory Visit | Attending: Orthopedic Surgery | Admitting: Orthopedic Surgery

## 2018-08-04 ENCOUNTER — Encounter (HOSPITAL_COMMUNITY): Payer: Self-pay | Admitting: Psychiatry

## 2018-08-04 ENCOUNTER — Other Ambulatory Visit: Payer: Self-pay

## 2018-08-04 ENCOUNTER — Encounter (HOSPITAL_COMMUNITY): Payer: Self-pay

## 2018-08-04 ENCOUNTER — Encounter (HOSPITAL_BASED_OUTPATIENT_CLINIC_OR_DEPARTMENT_OTHER): Payer: Self-pay | Admitting: *Deleted

## 2018-08-04 ENCOUNTER — Ambulatory Visit (INDEPENDENT_AMBULATORY_CARE_PROVIDER_SITE_OTHER): Payer: BLUE CROSS/BLUE SHIELD | Admitting: Psychiatry

## 2018-08-04 VITALS — BP 118/70 | HR 65 | Ht 66.0 in | Wt 253.0 lb

## 2018-08-04 DIAGNOSIS — Z9989 Dependence on other enabling machines and devices: Secondary | ICD-10-CM | POA: Diagnosis not present

## 2018-08-04 DIAGNOSIS — Z86010 Personal history of colonic polyps: Secondary | ICD-10-CM | POA: Diagnosis not present

## 2018-08-04 DIAGNOSIS — G4730 Sleep apnea, unspecified: Secondary | ICD-10-CM | POA: Diagnosis not present

## 2018-08-04 DIAGNOSIS — F331 Major depressive disorder, recurrent, moderate: Secondary | ICD-10-CM

## 2018-08-04 DIAGNOSIS — Z886 Allergy status to analgesic agent status: Secondary | ICD-10-CM | POA: Diagnosis not present

## 2018-08-04 DIAGNOSIS — F329 Major depressive disorder, single episode, unspecified: Secondary | ICD-10-CM | POA: Diagnosis not present

## 2018-08-04 DIAGNOSIS — G4733 Obstructive sleep apnea (adult) (pediatric): Secondary | ICD-10-CM

## 2018-08-04 DIAGNOSIS — F411 Generalized anxiety disorder: Secondary | ICD-10-CM | POA: Diagnosis not present

## 2018-08-04 DIAGNOSIS — F419 Anxiety disorder, unspecified: Secondary | ICD-10-CM | POA: Diagnosis not present

## 2018-08-04 DIAGNOSIS — Z6841 Body Mass Index (BMI) 40.0 and over, adult: Secondary | ICD-10-CM | POA: Diagnosis not present

## 2018-08-04 DIAGNOSIS — M797 Fibromyalgia: Secondary | ICD-10-CM

## 2018-08-04 DIAGNOSIS — I10 Essential (primary) hypertension: Secondary | ICD-10-CM | POA: Diagnosis not present

## 2018-08-04 DIAGNOSIS — Z79899 Other long term (current) drug therapy: Secondary | ICD-10-CM | POA: Diagnosis not present

## 2018-08-04 DIAGNOSIS — Z87442 Personal history of urinary calculi: Secondary | ICD-10-CM | POA: Diagnosis not present

## 2018-08-04 DIAGNOSIS — M1811 Unilateral primary osteoarthritis of first carpometacarpal joint, right hand: Secondary | ICD-10-CM | POA: Diagnosis present

## 2018-08-04 DIAGNOSIS — Z8711 Personal history of peptic ulcer disease: Secondary | ICD-10-CM | POA: Diagnosis not present

## 2018-08-04 DIAGNOSIS — Z91030 Bee allergy status: Secondary | ICD-10-CM | POA: Diagnosis not present

## 2018-08-04 DIAGNOSIS — K219 Gastro-esophageal reflux disease without esophagitis: Secondary | ICD-10-CM | POA: Diagnosis not present

## 2018-08-04 LAB — BASIC METABOLIC PANEL
Anion gap: 6 (ref 5–15)
BUN: 19 mg/dL (ref 6–20)
CHLORIDE: 106 mmol/L (ref 98–111)
CO2: 29 mmol/L (ref 22–32)
CREATININE: 1.1 mg/dL — AB (ref 0.44–1.00)
Calcium: 9.4 mg/dL (ref 8.9–10.3)
GFR calc Af Amer: >60 mL/min (ref >60)
GFR calc non Af Amer: 54 mL/min — ABNORMAL LOW (ref >60)
GLUCOSE: 126 mg/dL — AB (ref 70–99)
Potassium: 4.3 mmol/L (ref 3.5–5.1)
SODIUM: 141 mmol/L (ref 135–145)

## 2018-08-04 MED ORDER — BUPROPION HCL ER (XL) 450 MG PO TB24: 450.0000 mg | ORAL_TABLET | Freq: Every day | ORAL | 0 refills | Status: DC

## 2018-08-04 NOTE — Progress Notes (Signed)
Psychiatric Initial Adult Assessment   Patient Identification: Linda Buckley MRN:  [id number] Date of Evaluation:  08/04/2018 Referral Source: [facility] Chief Complaint:   Chief Complaint    Depression; Anxiety     Visit Diagnosis: No diagnosis found.  History of Present Illness:  Linda Buckley is a 62 y.o. female with depression and anxiety who presents to establish care as hospitalization (04/2018)/IOP follow-up.   She has been seeing Alver Fisher for therapy, which has been helpful.  HPI 04/2018 hospitalization: Patient is a 62 year old female with a reported past psychiatric history significant for depression and posttraumatic stress disorder who presented to the [facility] emergency department yesterday with suicidal ideation.  The patient stated she had seen her primary care provider earlier in the day, and expressed some suicidal ideation.  The primary care provider referred her to the emergency department.  She was seen in the emergency department and the decision was made to admit her to the hospital for depression and suicidal ideation.  She has a multiyear history of depression.  She has been on Cymbalta for fibromyalgia as well as depression.  She last saw a psychiatrist approximately 1 year ago.  She stated she was suffering from helplessness, hopelessness and worthlessness.  She admitted the fatigue, poor sleep and suicidal ideation without plan.  She was tearful throughout the interview.  She had no previous psychiatric admissions.  She stated that the biggest new stressors she had in her life was that she had to kick her son out of the house because of his drug use.  She was admitted to the hospital for evaluation and stabilization.  Patient reports that she had been on Wellbutrin XL 300 mg prior to hospital x 2 years and no medication changes made during hospitalization.  Other medications include: Klonopin 0.5 mg at bedtime as needed for past 1 year   Cymbalta 60 mg BID for fibromyalgia Lyrica 200 BID for fibromyalgia and back pain. Trazodone 25-50 mg at bedtime She reports that her medications make her sleepy during the day. She has diagnosis of OSA, and is compliant with CPAP 8 hours a night. Linda Buckley reports that she still has episodes of "the blues and finds self crying sometimes".  Prayer helps her get over her sadness.  She gets upset about her memory declining, stuttering and word finding.  She reports that her son has moved home since he has diabetes and has been "found down 3 times by police and brought to hospital".  He is leaving next month to a residential rehab program in [place].  Patient describes that her husband, sister, parents, family and church have been good supports for her, and she is glad that she had the psychiatric hospitalization to realize how depressed she was.     Patient is considering weight loss surgery for back pain and sciatica.  She has been cleared through bariatric program and mental health support on the bariatric team.  She requests a letter that she is in treatment so that she can move forward with surgery.   Associated Signs/Symptoms: Depression Symptoms:  difficulty concentrating, impaired memory, loss of energy/fatigue, (Hypo) Manic Symptoms:  denies Anxiety Symptoms:  denies Psychotic Symptoms:  denies PTSD Symptoms: Had a traumatic exposure:  in past, denies PTSD symptoms  Past Psychiatric History: Hospitalization at [facility] for SI in 04/2018.  No past suicide attempts.   Previous Psychotropic Medications: Yes   Substance Abuse History in the last 12 months:  No.  Consequences  of Substance Abuse: NA  Past Medical History:  Past Medical History:  Diagnosis Date  . Allergy   . Anxiety   . Arthritis   . Carpal tunnel syndrome of right wrist    RECURRENT  . Depression   . Dysphagia 09/02/2017  . Edema 09/02/2017  . Environmental allergies    dust mites, pollen, roaches and other  insects, mold  . Fibromyalgia 04/2016  . Headache    migraines  . Hereditary and idiopathic peripheral neuropathy 04/03/2016  . History of adenomatous polyp of colon   . History of gastric ulcer   . History of kidney stones   . Hypertension   . Pneumonia   . Sleep apnea    wears CPAP  . Wears glasses     Past Surgical History:  Procedure Laterality Date  . CARPAL TUNNEL RELEASE Right 2007  . CARPAL TUNNEL RELEASE Right 07/05/2015   Procedure: RIGHT HAND REVISION CARPAL TUNNEL RELEASE;  Surgeon: Iran Planas, MD;  Location: [place];  Service: Orthopedics;  Laterality: Right;  . CARPAL TUNNEL RELEASE Left 09/2016  . CERVICAL DISC ARTHROPLASTY  03/2015  . COLONOSCOPY W/ POLYPECTOMY  04-15-2014  . CYSTO/  RIGHT RETROGRADE PYELOGRAM/ URETEROSCOPY STONE EXTRACTION/  STENT PLACEMENT  06-07-2010  . LAPAROSCOPIC ASSISTED VAGINAL HYSTERECTOMY  04-25-2004   ovaries remain  . TUBAL LIGATION  1982    Family Psychiatric History: Many family members with depression Cousin completed suicide at 25  Family History:  Family History  Problem Relation Age of Onset  . Asthma Father   . Diabetes Father   . Arthritis Mother   . Hypertension Mother   . Diabetes Mother   . Cancer Mother   . Stomach cancer Mother   . Diabetes Brother   . Diabetes Brother   . Asthma Son   . Alcohol abuse Son   . Anxiety disorder Son   . Schizophrenia Son   . Cancer Maternal Grandfather        colon  . Colon cancer Maternal Grandfather   . Cancer Maternal Aunt        breast  . Cancer Maternal Uncle        lung  . Colon cancer Maternal Uncle   . Cancer Maternal Grandmother        ovarian  . Diabetes Son     Social History:   Social History   Socioeconomic History  . Marital status: Married    Spouse name: Not on file  . Number of children: 2  . Years of education: 2 yrs college  . Highest education level: Not on file  Occupational History  . 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 Financial resource strain: Very hard  . Food insecurity:    Worry: Often true    Inability: Often true  . Transportation needs:    Medical: No    Non-medical: No  Tobacco Use  . Smoking status: Never Smoker  . Smokeless tobacco: Never Used  Substance and Sexual Activity  . Alcohol use: Not Currently    Comment: 2 glasses of wine weekly - reports none in 2 years  . Drug use: No    Comment: prescription for klonopin and oxy IR  . Sexual activity: Yes    Partners: Male    Birth control/protection: None    Comment: partial hysterectomy  Lifestyle  . Physical activity:    Days per week: 0 days    Minutes per  session: 0 min  . Stress: Rather much  Relationships  . Social connections:    Talks on phone: More than three times a week    Gets together: Once a week    Attends religious service: More than 4 times per year    Active member of club or organization: Yes    Attends meetings of clubs or organizations: 1 to 4 times per year    Relationship status: Married  Other Topics Concern  . Not on file  Social History Narrative   Lives at home w/ her husband   Right-handed   Drinks 2 cups of coffee weekly    3 bottle water per day    Additional Social History:  Lives with husband and 67 year old son on disability. 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  Off caffeine x 2 weeks Has started to section off food the way she will have to eat after bariatric surgery.  Allergies:   Allergies  Allergen Reactions  . Aspirin Swelling    Sweating/ swelling of hands and face   . Bee Venom Swelling    Swelling at site     Metabolic Disorder Labs: Lab Results  Component Value Date   HGBA1C 6.0 11/05/2017   No results found for: PROLACTIN Lab Results  Component Value Date   CHOL 183 06/17/2018   TRIG 175.0 (H) 06/17/2018   HDL 51.10 06/17/2018   CHOLHDL 4 06/17/2018   VLDL 35.0 06/17/2018   LDLCALC 97 06/17/2018    LDLCALC 103 (H) 11/05/2017     Current Medications: Current Outpatient Medications  Medication Sig Dispense Refill  . buPROPion (WELLBUTRIN XL) 300 MG 24 hr tablet Take 1 tablet (300 mg total) by mouth daily. 30 tablet 0  . cetirizine (ZYRTEC) 10 MG tablet Take 1 tablet (10 mg total) by mouth daily. 30 tablet 11  . Cholecalciferol (VITAMIN D PO) Take 1 tablet by mouth daily. 1000units    . clonazePAM (KLONOPIN) 0.5 MG tablet Take 1 tablet (0.5 mg total) by mouth at bedtime. 30 tablet 3  . diclofenac sodium (VOLTAREN) 1 % GEL APPLY 4 GRAMS EXTERNALLY TO THE AFFECTED AREA FOUR TIMES DAILY. 100 g 3  . DULoxetine (CYMBALTA) 60 MG capsule Take 1 capsule (60 mg total) by mouth 2 (two) times daily. 60 capsule 0  . furosemide (LASIX) 20 MG tablet TAKE 1 TABLET(20 MG) BY MOUTH DAILY 30 tablet 6  . losartan-hydrochlorothiazide (HYZAAR) 50-12.5 MG tablet TAKE 1 TABLET BY MOUTH EVERY DAY 90 tablet 0  . Multiple Vitamin (MULTIVITAMIN) capsule Take 1 capsule by mouth daily.    . Oxycodone HCl 10 MG TABS TK 1 T PO Q 8 H PRN P  0  . pantoprazole (PROTONIX) 40 MG tablet Take 1 tablet (40 mg total) by mouth daily. 30 tablet 0  . pregabalin (LYRICA) 200 MG capsule Take 1 capsule (200 mg total) by mouth 2 (two) times daily. 180 capsule 0  . QVAR REDIHALER 80 MCG/ACT inhaler INHALE 1 PUFF TWICE DAILY 10.6 g 0  . traZODone (DESYREL) 50 MG tablet TAKE 1/2 TO 1 TABLET(25 TO 50 MG) BY MOUTH AT BEDTIME AS NEEDED FOR SLEEP 30 tablet 0  . VENTOLIN HFA 108 (90 Base) MCG/ACT inhaler INHALE 2 PUFFS BY MOUTH EVERY 4 HOURS AS NEEDED FOR SHORTNESS OF BREATH 36 g 0  . vitamin E 1000 UNIT capsule Take by mouth.     No current facility-administered medications  for this visit.     Neurologic: Headache: No Seizure: No Paresthesias:No  Musculoskeletal: Strength & Muscle Tone: within normal limits Gait & Station: normal Patient leans: N/A  Psychiatric Specialty Exam: Review of Systems  Constitutional: Negative.    Respiratory: Negative.   Cardiovascular: Negative.   Gastrointestinal: Negative.   Musculoskeletal: Positive for back pain, joint pain and myalgias. Negative for falls.  Neurological: Negative.   Psychiatric/Behavioral: Negative for depression, hallucinations, memory loss, substance abuse and suicidal ideas. The patient is not nervous/anxious and does not have insomnia.     Blood pressure 118/70, pulse 65, height 5\' 6"  (1.676 m), weight 253 lb (114.8 kg), SpO2 95 %.Body mass index is 40.84 kg/m.  General Appearance: Well Groomed  Eye Contact:  Good  Speech:  Clear and Coherent and Normal Rate  Volume:  Normal  Mood:  Euthymic  Affect:  Congruent  Thought Process:  Coherent, Goal Directed, Linear and Descriptions of Associations: Intact  Orientation:  Full (Time, Place, and Person)  Thought Content:  Logical and Hallucinations: None  Suicidal Thoughts:  No  Homicidal Thoughts:  No  Memory:  Immediate;   Good Recent;   Good Remote;   Good  Judgement:  Good  Insight:  Good  Psychomotor Activity:  Normal  Concentration:  Concentration: Good and Attention Span: Good  Recall:  Good  Fund of Knowledge:Good  Language: Good  Akathisia:  No  Handed:  Right  AIMS (if indicated):  N/a  Assets:  Communication Skills Desire for Improvement Financial Resources/Insurance Housing Intimacy Leisure Time Resilience Social Support Herbalist Vocational/Educational Others:  involved in church  ADL's:  Intact  Cognition: WNL  Sleep:  OSA compliant with CPAP for 8 hours/night with good control of respiratory events    Treatment Plan Summary:  Linda Buckley is a 62 y.o. female with depression and anxiety who is doing well on current medications after hospital and IOP discharge.  She is attending therapy and has been cleared for bariatric surgery.  I have reviewed the risk of depression and SI after bariatric surgery and patient contracts for safety and is able to  list resources to reach out to should her depression worsen or SI develop. Given her bouts of sadness, will maximize Wellbutrin XL to 450 mg daily. Continue Cymbalta 60 mg BID, and trazodone and Klonopin PRN. Reviewed sedative effects of her medication and discussed alerting medications that could be provided in future for treated OSA with continued excessive daytime sleepiness after depression treated and sedating daytime medications minimized.  Patient can further discuss with her sleep provider.   Time spent 65 minutes.  More than 50% of the time spent in medication education, psychoeducation, counseling and coordination of care.  Discuss safety plan that anytime having active suicidal thoughts or homicidal thoughts then patient need to call 911 or go to the local emergency room.  RTC in 2 months and PRN      Lavella Hammock, MD 8/20/20191:34 PM

## 2018-08-06 NOTE — H&P (Signed)
Linda Buckley is an 62 y.o. female.   CC / Reason for Visit: Right thumb pain HPI: This patient returns reevaluation, indicating that her thumb pain improved with the injection, but not significantly enough for her to consider protracted nonoperative care  HPI 06-25-18: This patient is a 62 year old RHD unemployed female with fibromyalgia who presents for evaluation of her right hand.  She has had previous care at [facility], and my understanding is that she has undergone an open carpal tunnel release bilaterally.  However she also has painful deformity of the right thumb.  She has been told that she has arthritis, and is taking tramadol.  She reports she can tolerate NSAIDs but is not taking one presently.  Past Medical History:  Diagnosis Date  . Allergy   . Anxiety   . Arthritis   . Carpal tunnel syndrome of right wrist    RECURRENT  . Depression   . Dysphagia 09/02/2017  . Edema 09/02/2017  . Environmental allergies    dust mites, pollen, roaches and other insects, mold  . Fibromyalgia 04/2016  . Headache    migraines  . Hereditary and idiopathic peripheral neuropathy 04/03/2016  . History of adenomatous polyp of colon   . History of gastric ulcer   . History of kidney stones   . Hypertension   . Pneumonia   . Sleep apnea    wears CPAP  . Wears glasses     Past Surgical History:  Procedure Laterality Date  . CARPAL TUNNEL RELEASE Right 2007  . CARPAL TUNNEL RELEASE Right 07/05/2015   Procedure: RIGHT HAND REVISION CARPAL TUNNEL RELEASE;  Surgeon: Iran Planas, MD;  Location: [place];  Service: Orthopedics;  Laterality: Right;  . CARPAL TUNNEL RELEASE Left 09/2016  . CERVICAL DISC ARTHROPLASTY  03/2015  . COLONOSCOPY W/ POLYPECTOMY  04-15-2014  . CYSTO/  RIGHT RETROGRADE PYELOGRAM/ URETEROSCOPY STONE EXTRACTION/  STENT PLACEMENT  06-07-2010  . LAPAROSCOPIC ASSISTED VAGINAL HYSTERECTOMY  04-25-2004   ovaries remain  . TUBAL LIGATION  1982    Family History   Problem Relation Age of Onset  . Asthma Father   . Diabetes Father   . Arthritis Mother   . Hypertension Mother   . Diabetes Mother   . Cancer Mother   . Stomach cancer Mother   . Diabetes Brother   . Diabetes Brother   . Asthma Son   . Alcohol abuse Son   . Anxiety disorder Son   . Schizophrenia Son   . Cancer Maternal Grandfather        colon  . Colon cancer Maternal Grandfather   . Cancer Maternal Aunt        breast  . Cancer Maternal Uncle        lung  . Colon cancer Maternal Uncle   . Cancer Maternal Grandmother        ovarian  . Diabetes Son    Social History:  reports that she has never smoked. She has never used smokeless tobacco. She reports that she drank alcohol. She reports that she does not use drugs.  Allergies:  Allergies  Allergen Reactions  . Aspirin Swelling    Sweating/ swelling of hands and face   . Bee Venom Swelling    Swelling at site     No medications prior to admission.    No results found for this or any previous visit (from the past 48 hour(s)). No results found.  Review of Systems  All other systems reviewed  and are negative.   Height 5\' 6"  (1.676 m), weight 115.2 kg. Physical Exam  Constitutional:  WD, WN, NAD HEENT:  NCAT, EOMI Neuro/Psych:  Alert & oriented to person, place, and time; appropriate mood & affect Lymphatic: No generalized UE edema or lymphadenopathy Extremities / MSK:  Both UE are normal with respect to appearance, ranges of motion, joint stability, muscle strength/tone, sensation, & perfusion except as otherwise noted:  The right thumb rests with the MP joint ulnarly deviated 25.  The MP joint flexes to 55, IP to 55.  There is pain at the MP joint more so than the [facility] joint, which has minimal exacerbation with stress and grind testing  Labs / X-rays:  X-rays from 06-25-18: Multiple views of the left hand [facility] series) reveals reasonably healthy appearing TMC joints bilaterally, but severe advanced degenerative  change at the  right MP joint of the thumb, with 25+ ulnar angulation  Assessment: Chronic right thumb MP RCL insufficiency with subluxation and advanced degenerative arthritis  Plan:   We discussed today's findings, and her improvement with the injection.  She still has significant symptoms however he wishes to proceed operatively.  We discussed the details of MP joint fusion, what that entails, the type of hardware, etc.  She understands that it will remove all motion at the MP joint.  We will schedule according to the preferences of her timing.  The details of the operative procedure were discussed with the patient.  Questions were invited and answered.  In addition to the goal of the procedure, the risks of the procedure to include but not limited to bleeding; infection; damage to the nerves or blood vessels that could result in bleeding, numbness, weakness, chronic pain, and the need for additional procedures; stiffness; the need for revision surgery; and anesthetic risks were reviewed.  No specific outcome was guaranteed or implied.  Informed consent was obtained.  Jolyn Nap, MD 08/06/2018, 3:58 PM

## 2018-08-09 ENCOUNTER — Encounter (HOSPITAL_BASED_OUTPATIENT_CLINIC_OR_DEPARTMENT_OTHER): Payer: Self-pay | Admitting: Anesthesiology

## 2018-08-09 NOTE — Anesthesia Preprocedure Evaluation (Addendum)
Anesthesia Evaluation    Reviewed: Allergy & Precautions, Patient's Chart, lab work & pertinent test results  Airway Mallampati: II  TM Distance: >3 FB Neck ROM: Full    Dental no notable dental hx.    Pulmonary sleep apnea ,    Pulmonary exam normal breath sounds clear to auscultation       Cardiovascular hypertension, Pt. on medications Normal cardiovascular exam Rhythm:Regular Rate:Normal     Neuro/Psych PSYCHIATRIC DISORDERS Anxiety Depression    GI/Hepatic GERD  Medicated,  Endo/Other  Morbid obesity  Renal/GU      Musculoskeletal  (+) Arthritis , Osteoarthritis,  Fibromyalgia -  Abdominal   Peds  Hematology   Anesthesia Other Findings   Reproductive/Obstetrics                            Anesthesia Physical Anesthesia Plan  ASA: III  Anesthesia Plan: MAC and Regional   Post-op Pain Management:    Induction: Intravenous  PONV Risk Score and Plan: 2 and Ondansetron and Midazolam  Airway Management Planned: Simple Face Mask  Additional Equipment:   Intra-op Plan:   Post-operative Plan:   Informed Consent: I have reviewed the patients History and Physical, chart, labs and discussed the procedure including the risks, benefits and alternatives for the proposed anesthesia with the patient or authorized representative who has indicated his/her understanding and acceptance.   Dental advisory given  Plan Discussed with: CRNA  Anesthesia Plan Comments:        Anesthesia Quick Evaluation                                  Anesthesia Evaluation  Patient identified by MRN, date of birth, ID band Patient awake    Reviewed: Allergy & Precautions, NPO status , Patient's Chart, lab work & pertinent test results  Airway Mallampati: II  TM Distance: <3 FB Neck ROM: Full    Dental no notable dental hx.    Pulmonary neg pulmonary ROS,  breath sounds clear to  auscultation  Pulmonary exam normal       Cardiovascular hypertension, Pt. on medications Normal cardiovascular examRhythm:Regular Rate:Normal     Neuro/Psych negative neurological ROS  negative psych ROS   GI/Hepatic negative GI ROS, Neg liver ROS,   Endo/Other  Morbid obesity  Renal/GU negative Renal ROS  negative genitourinary   Musculoskeletal negative musculoskeletal ROS (+)   Abdominal   Peds negative pediatric ROS (+)  Hematology negative hematology ROS (+)   Anesthesia Other Findings   Reproductive/Obstetrics negative OB ROS                            Anesthesia Physical Anesthesia Plan  ASA: II  Anesthesia Plan: General   Post-op Pain Management:    Induction: Intravenous  Airway Management Planned: LMA  Additional Equipment:   Intra-op Plan:   Post-operative Plan:   Informed Consent: I have reviewed the patients History and Physical, chart, labs and discussed the procedure including the risks, benefits and alternatives for the proposed anesthesia with the patient or authorized representative who has indicated his/her understanding and acceptance.   Dental advisory given  Plan Discussed with: CRNA and Surgeon  Anesthesia Plan Comments: (Pt requests GA)       Anesthesia Quick Evaluation

## 2018-08-10 ENCOUNTER — Encounter (HOSPITAL_BASED_OUTPATIENT_CLINIC_OR_DEPARTMENT_OTHER): Payer: Self-pay | Admitting: Emergency Medicine

## 2018-08-10 ENCOUNTER — Encounter (HOSPITAL_BASED_OUTPATIENT_CLINIC_OR_DEPARTMENT_OTHER)
Admission: RE | Disposition: A | Discharge status: Home / Self Care | Payer: Self-pay | Source: Ambulatory Visit | Attending: Orthopedic Surgery

## 2018-08-10 ENCOUNTER — Ambulatory Visit (HOSPITAL_COMMUNITY): Payer: BLUE CROSS/BLUE SHIELD

## 2018-08-10 ENCOUNTER — Other Ambulatory Visit: Payer: Self-pay

## 2018-08-10 ENCOUNTER — Ambulatory Visit (HOSPITAL_BASED_OUTPATIENT_CLINIC_OR_DEPARTMENT_OTHER)
Admission: RE | Admit: 2018-08-10 | Discharge: 2018-08-10 | Disposition: A | Discharge status: Home / Self Care | Payer: BLUE CROSS/BLUE SHIELD | Source: Ambulatory Visit | Attending: Orthopedic Surgery | Admitting: Orthopedic Surgery

## 2018-08-10 ENCOUNTER — Ambulatory Visit (HOSPITAL_BASED_OUTPATIENT_CLINIC_OR_DEPARTMENT_OTHER): Payer: BLUE CROSS/BLUE SHIELD | Admitting: Anesthesiology

## 2018-08-10 DIAGNOSIS — Z6841 Body Mass Index (BMI) 40.0 and over, adult: Secondary | ICD-10-CM | POA: Insufficient documentation

## 2018-08-10 DIAGNOSIS — M1811 Unilateral primary osteoarthritis of first carpometacarpal joint, right hand: Secondary | ICD-10-CM | POA: Diagnosis not present

## 2018-08-10 DIAGNOSIS — K219 Gastro-esophageal reflux disease without esophagitis: Secondary | ICD-10-CM | POA: Insufficient documentation

## 2018-08-10 DIAGNOSIS — Z86010 Personal history of colonic polyps: Secondary | ICD-10-CM | POA: Insufficient documentation

## 2018-08-10 DIAGNOSIS — Z91030 Bee allergy status: Secondary | ICD-10-CM | POA: Insufficient documentation

## 2018-08-10 DIAGNOSIS — M797 Fibromyalgia: Secondary | ICD-10-CM | POA: Insufficient documentation

## 2018-08-10 DIAGNOSIS — F329 Major depressive disorder, single episode, unspecified: Secondary | ICD-10-CM | POA: Insufficient documentation

## 2018-08-10 DIAGNOSIS — Z9989 Dependence on other enabling machines and devices: Secondary | ICD-10-CM | POA: Insufficient documentation

## 2018-08-10 DIAGNOSIS — F419 Anxiety disorder, unspecified: Secondary | ICD-10-CM | POA: Insufficient documentation

## 2018-08-10 DIAGNOSIS — Z8711 Personal history of peptic ulcer disease: Secondary | ICD-10-CM | POA: Insufficient documentation

## 2018-08-10 DIAGNOSIS — Z87442 Personal history of urinary calculi: Secondary | ICD-10-CM | POA: Insufficient documentation

## 2018-08-10 DIAGNOSIS — I10 Essential (primary) hypertension: Secondary | ICD-10-CM | POA: Insufficient documentation

## 2018-08-10 DIAGNOSIS — Z79899 Other long term (current) drug therapy: Secondary | ICD-10-CM | POA: Insufficient documentation

## 2018-08-10 DIAGNOSIS — Z886 Allergy status to analgesic agent status: Secondary | ICD-10-CM | POA: Insufficient documentation

## 2018-08-10 DIAGNOSIS — Z419 Encounter for procedure for purposes other than remedying health state, unspecified: Secondary | ICD-10-CM

## 2018-08-10 DIAGNOSIS — G4730 Sleep apnea, unspecified: Secondary | ICD-10-CM | POA: Insufficient documentation

## 2018-08-10 HISTORY — PX: FINGER ARTHRODESIS: SHX5000

## 2018-08-10 SURGERY — FUSION, JOINT, FINGER
Anesthesia: Monitor Anesthesia Care | Laterality: Right

## 2018-08-10 MED ORDER — FENTANYL CITRATE (PF) 100 MCG/2ML IJ SOLN
INTRAMUSCULAR | Status: AC
  Filled 2018-08-10: qty 2

## 2018-08-10 MED ORDER — BUPIVACAINE-EPINEPHRINE (PF) 0.5% -1:200000 IJ SOLN
INTRAMUSCULAR | Status: AC
  Filled 2018-08-10: qty 30

## 2018-08-10 MED ORDER — ONDANSETRON HCL 4 MG/2ML IJ SOLN
INTRAMUSCULAR | Status: DC | PRN
  Administered 2018-08-10: 4 mg via INTRAVENOUS

## 2018-08-10 MED ORDER — MIDAZOLAM HCL 2 MG/2ML IJ SOLN
1.0000 mg | INTRAMUSCULAR | Status: DC | PRN
  Administered 2018-08-10: 2 mg via INTRAVENOUS

## 2018-08-10 MED ORDER — MELOXICAM 7.5 MG PO TABS: 7.5000 mg | ORAL_TABLET | Freq: Every day | ORAL | 1 refills | Status: DC

## 2018-08-10 MED ORDER — HYDROMORPHONE HCL 1 MG/ML IJ SOLN: 0.2500 mg | INTRAMUSCULAR | Status: DC | PRN

## 2018-08-10 MED ORDER — PHENYLEPHRINE 40 MCG/ML (10ML) SYRINGE FOR IV PUSH (FOR BLOOD PRESSURE SUPPORT)
PREFILLED_SYRINGE | INTRAVENOUS | Status: AC
  Filled 2018-08-10: qty 10

## 2018-08-10 MED ORDER — CEFAZOLIN SODIUM-DEXTROSE 2-4 GM/100ML-% IV SOLN
2.0000 g | INTRAVENOUS | Status: AC
  Administered 2018-08-10: 2 g via INTRAVENOUS

## 2018-08-10 MED ORDER — ONDANSETRON HCL 4 MG/2ML IJ SOLN
INTRAMUSCULAR | Status: AC
  Filled 2018-08-10: qty 2

## 2018-08-10 MED ORDER — LIDOCAINE HCL (CARDIAC) PF 100 MG/5ML IV SOSY
PREFILLED_SYRINGE | INTRAVENOUS | Status: DC | PRN
  Administered 2018-08-10: 50 mg via INTRAVENOUS

## 2018-08-10 MED ORDER — PROPOFOL 10 MG/ML IV BOLUS
INTRAVENOUS | Status: DC | PRN
  Administered 2018-08-10: 150 mg via INTRAVENOUS

## 2018-08-10 MED ORDER — DEXAMETHASONE SODIUM PHOSPHATE 10 MG/ML IJ SOLN
INTRAMUSCULAR | Status: DC | PRN
  Administered 2018-08-10: 4 mg via INTRAVENOUS

## 2018-08-10 MED ORDER — EPHEDRINE SULFATE 50 MG/ML IJ SOLN
INTRAMUSCULAR | Status: DC | PRN
  Administered 2018-08-10: 10 mg via INTRAVENOUS

## 2018-08-10 MED ORDER — ROPIVACAINE HCL 5 MG/ML IJ SOLN
INTRAMUSCULAR | Status: DC | PRN
  Administered 2018-08-10: 30 mL via PERINEURAL

## 2018-08-10 MED ORDER — MELOXICAM 15 MG PO TABS: 7.5000 mg | ORAL_TABLET | Freq: Every day | ORAL | 2 refills | Status: DC

## 2018-08-10 MED ORDER — EPHEDRINE 5 MG/ML INJ
INTRAVENOUS | Status: AC
  Filled 2018-08-10: qty 10

## 2018-08-10 MED ORDER — DEXAMETHASONE SODIUM PHOSPHATE 10 MG/ML IJ SOLN
INTRAMUSCULAR | Status: AC
  Filled 2018-08-10: qty 1

## 2018-08-10 MED ORDER — LACTATED RINGERS IV SOLN: INTRAVENOUS | Status: DC

## 2018-08-10 MED ORDER — PROMETHAZINE HCL 25 MG/ML IJ SOLN: 6.2500 mg | INTRAMUSCULAR | Status: DC | PRN

## 2018-08-10 MED ORDER — LIDOCAINE 2% (20 MG/ML) 5 ML SYRINGE
INTRAMUSCULAR | Status: AC
  Filled 2018-08-10: qty 5

## 2018-08-10 MED ORDER — ACETAMINOPHEN 325 MG PO TABS: 650.0000 mg | ORAL_TABLET | Freq: Four times a day (QID) | ORAL | Status: DC

## 2018-08-10 MED ORDER — SCOPOLAMINE 1 MG/3DAYS TD PT72: 1.0000 | MEDICATED_PATCH | Freq: Once | TRANSDERMAL | Status: DC | PRN

## 2018-08-10 MED ORDER — CEFAZOLIN SODIUM-DEXTROSE 2-4 GM/100ML-% IV SOLN
INTRAVENOUS | Status: AC
  Filled 2018-08-10: qty 100

## 2018-08-10 MED ORDER — OXYCODONE HCL 5 MG PO TABS: 5.0000 mg | ORAL_TABLET | Freq: Once | ORAL | Status: DC | PRN

## 2018-08-10 MED ORDER — LACTATED RINGERS IV SOLN
INTRAVENOUS | Status: DC
  Administered 2018-08-10: 08:00:00 via INTRAVENOUS

## 2018-08-10 MED ORDER — MIDAZOLAM HCL 2 MG/2ML IJ SOLN
INTRAMUSCULAR | Status: AC
  Filled 2018-08-10: qty 2

## 2018-08-10 MED ORDER — OXYCODONE HCL 5 MG/5ML PO SOLN: 5.0000 mg | Freq: Once | ORAL | Status: DC | PRN

## 2018-08-10 MED ORDER — FENTANYL CITRATE (PF) 100 MCG/2ML IJ SOLN
50.0000 ug | INTRAMUSCULAR | Status: DC | PRN
  Administered 2018-08-10: 100 ug via INTRAVENOUS

## 2018-08-10 MED ORDER — SUCCINYLCHOLINE CHLORIDE 200 MG/10ML IV SOSY
PREFILLED_SYRINGE | INTRAVENOUS | Status: AC
  Filled 2018-08-10: qty 10

## 2018-08-10 SURGICAL SUPPLY — 69 items
BLADE AVERAGE 25X9 (BLADE) IMPLANT
BLADE MINI RND TIP GREEN BEAV (BLADE) IMPLANT
BLADE SURG 15 STRL LF DISP TIS (BLADE) ×1 IMPLANT
BLADE SURG 15 STRL SS (BLADE) ×2
BNDG CMPR 9X4 STRL LF SNTH (GAUZE/BANDAGES/DRESSINGS)
BNDG COHESIVE 1X5 TAN STRL LF (GAUZE/BANDAGES/DRESSINGS) ×1 IMPLANT
BNDG COHESIVE 4X5 TAN STRL (GAUZE/BANDAGES/DRESSINGS) ×1 IMPLANT
BNDG CONFORM 2 STRL LF (GAUZE/BANDAGES/DRESSINGS) IMPLANT
BNDG ESMARK 4X9 LF (GAUZE/BANDAGES/DRESSINGS) IMPLANT
BNDG GAUZE ELAST 4 BULKY (GAUZE/BANDAGES/DRESSINGS) ×2 IMPLANT
CHLORAPREP W/TINT 26ML (MISCELLANEOUS) ×2 IMPLANT
CORD BIPOLAR FORCEPS 12FT (ELECTRODE) ×2 IMPLANT
COVER BACK TABLE 60X90IN (DRAPES) ×2 IMPLANT
COVER MAYO STAND STRL (DRAPES) ×2 IMPLANT
CUFF TOURNIQUET SINGLE 18IN (TOURNIQUET CUFF) ×1 IMPLANT
DRAPE C-ARM 42X72 X-RAY (DRAPES) ×2 IMPLANT
DRAPE EXTREMITY T 121X128X90 (DRAPE) ×2 IMPLANT
DRAPE SURG 17X23 STRL (DRAPES) ×2 IMPLANT
DRSG EMULSION OIL 3X3 NADH (GAUZE/BANDAGES/DRESSINGS) ×2 IMPLANT
ELECT REM PT RETURN 9FT ADLT (ELECTROSURGICAL)
ELECTRODE REM PT RTRN 9FT ADLT (ELECTROSURGICAL) IMPLANT
GAUZE SPONGE 4X4 12PLY STRL (GAUZE/BANDAGES/DRESSINGS) ×1 IMPLANT
GAUZE SPONGE 4X4 12PLY STRL LF (GAUZE/BANDAGES/DRESSINGS) ×2 IMPLANT
GLOVE BIO SURGEON STRL SZ7.5 (GLOVE) ×2 IMPLANT
GLOVE BIOGEL PI IND STRL 7.0 (GLOVE) ×1 IMPLANT
GLOVE BIOGEL PI IND STRL 8 (GLOVE) ×1 IMPLANT
GLOVE BIOGEL PI INDICATOR 7.0 (GLOVE) ×1
GLOVE BIOGEL PI INDICATOR 8 (GLOVE) ×1
GLOVE ECLIPSE 6.5 STRL STRAW (GLOVE) ×2 IMPLANT
GOWN STRL REUS W/ TWL LRG LVL3 (GOWN DISPOSABLE) ×2 IMPLANT
GOWN STRL REUS W/TWL LRG LVL3 (GOWN DISPOSABLE) ×4
GOWN STRL REUS W/TWL XL LVL3 (GOWN DISPOSABLE) ×2 IMPLANT
GUIDEWIRE 1.6 (WIRE) ×4
GUIDEWIRE ORTH 1.6XTROC (WIRE) IMPLANT
IMPL METACARPAL LG 6X30MM (Orthopedic Implant) IMPLANT
IMPLANT METACARPAL LG 6X30MM (Orthopedic Implant) ×2 IMPLANT
K-WIRE 0.35X4 (WIRE) ×2
KWIRE 0.35X4 (WIRE) IMPLANT
LOOP VESSEL MINI RED (MISCELLANEOUS) IMPLANT
NEEDLE HYPO 22GX1.5 SAFETY (NEEDLE) IMPLANT
NS IRRIG 1000ML POUR BTL (IV SOLUTION) ×2 IMPLANT
PACK BASIN DAY SURGERY FS (CUSTOM PROCEDURE TRAY) ×2 IMPLANT
PENCIL BUTTON HOLSTER BLD 10FT (ELECTRODE) IMPLANT
RASP DURCEL BLADE 16MM (DRILL) ×1 IMPLANT
RASP LAG SCREW 16MM (DRILL) ×1 IMPLANT
SCREW LAG 4.0X28MM (Screw) ×1 IMPLANT
SCREW LAG 4.0X30 (Screw) ×1 IMPLANT
SLING ARM FOAM STRAP LRG (SOFTGOODS) ×1 IMPLANT
SPLINT FIBERGLASS 4X30 (CAST SUPPLIES) ×1 IMPLANT
SPLINT PLASTER CAST XFAST 4X15 (CAST SUPPLIES) IMPLANT
SPLINT PLASTER XTRA FAST SET 4 (CAST SUPPLIES) ×1
STOCKINETTE 6  STRL (DRAPES) ×1
STOCKINETTE 6 STRL (DRAPES) ×1 IMPLANT
SUCTION FRAZIER HANDLE 10FR (MISCELLANEOUS) ×1
SUCTION TUBE FRAZIER 10FR DISP (MISCELLANEOUS) IMPLANT
SUT STEEL 2 (SUTURE) IMPLANT
SUT STEEL 4 (SUTURE) IMPLANT
SUT STEEL 5 (SUTURE) IMPLANT
SUT VIC AB 4-0 RB1 27 (SUTURE) ×2
SUT VIC AB 4-0 RB1 27X BRD (SUTURE) IMPLANT
SUT VICRYL RAPIDE 4-0 (SUTURE) IMPLANT
SUT VICRYL RAPIDE 4/0 PS 2 (SUTURE) ×1 IMPLANT
SUT VICRYL+ 3-0 27IN RB-1 (SUTURE) ×2 IMPLANT
SYR 10ML LL (SYRINGE) IMPLANT
SYR BULB 3OZ (MISCELLANEOUS) IMPLANT
TOWEL GREEN STERILE FF (TOWEL DISPOSABLE) ×2 IMPLANT
TOWEL OR NON WOVEN STRL DISP B (DISPOSABLE) IMPLANT
TUBING SUCTION 1/4X6FT (MISCELLANEOUS) ×1 IMPLANT
UNDERPAD 30X30 (UNDERPADS AND DIAPERS) ×2 IMPLANT

## 2018-08-10 NOTE — Op Note (Signed)
08/10/2018  7:44 AM  PATIENT:  Linda Buckley  62 y.o. female  PRE-OPERATIVE DIAGNOSIS:  Right thumb MPJ arthritis  POST-OPERATIVE DIAGNOSIS:  Same  PROCEDURE:  R thumb MPJ arthrodesis  SURGEON: Rayvon Char. Grandville Silos, MD  PHYSICIAN ASSISTANT: Morley Kos, OPA-C  ANESTHESIA:  regional and general  SPECIMENS:  None  DRAINS:   None  EBL:  less than 50 mL  PREOPERATIVE INDICATIONS:  ELYSA WOMAC is a  62 y.o. female with an arthritic and unstable right thumb MP joint  The risks benefits and alternatives were discussed with the patient preoperatively including but not limited to the risks of infection, bleeding, nerve injury, cardiopulmonary complications, the need for revision surgery, among others, and the patient verbalized understanding and consented to proceed.  OPERATIVE IMPLANTS: Extremity Medical xMCP implant-prox large, distal solid 1mm  OPERATIVE PROCEDURE:  After receiving prophylactic antibiotics and a regional block, the patient was escorted to the operative theatre and placed in a supine position.  General anesthesia was administered.  A surgical "time-out" was performed during which the planned procedure, proposed operative site, and the correct patient identity were compared to the operative consent and agreement confirmed by the circulating nurse according to current facility policy.  Following application of a tourniquet to the operative extremity, the exposed skin was prepped with Chloraprep and draped in the usual sterile fashion.  The limb was exsanguinated with an Esmarch bandage and the tourniquet inflated to approximately 168mmHg higher than systolic BP.  A dorsal approach was marked and made sharply with scalpel.  After incising the skin, subcutaneous tissues were dissected with blunt and spreading dissection, with care to preserve the dorsal cutaneous nerves.  The extensor apparatus was split down the midline, between the EPL contribution and the other  contributions.  It was reflected radially and ulnarly.  The dorsal capsule was incised and reflected.  The joint was found to be badly arthritic, with deformed articular surfaces.  Guidepin was placed using fluoroscopic guidance into the metacarpal, and satisfied with the position the implant was sized.  We selected a large implant and it was placed.  It had good purchase in the metacarpal.  Next, the planar was used to create the fusion surface on the head of the first metacarpal, supplemented with a rondure.  The dorsal cortical window was also broached.  Attention was shifted to the proximal phalanx where again a guidepin was placed with fluoroscopic guidance and the plate were used to create the surface for fusion at the base of the proximal phalanx.  Reamings were saved and used later for bone graft.  We measured for length 28 screw, and it was placed.  Initially we used a cannulated screw, and it did not have good purchase in the proximal phalanx.  It was removed, some of the bone graft was placed down the canal and a size 30 solid screw, which had slightly larger outer diameter apparently was placed, and with this it had better purchase.  However, to augment rotational stability, placed a 0.035 inch K wire obliquely across the joint as well and cut it off so that it would be buried.  Final images were obtained.  The external surfaces of both bones were then smoothed shaped with a rondure, leaving the morselized bone and placed as onlay cancellous graft.  Final fluoroscopic images were obtained.  Some of the collateral ligament remnants were then excised to help decrease the bulkiness about the MP joint and the wound was again irrigated.  The capsule was closed with 4-0 Vicryl interrupted sutures.  The extensor apparatus was closed with the same type of sutures.  Tourniquet was released, additional hemostasis obtained with bipolar electrocautery and direct pressure and the skin was closed with 4-0 Vicryl  Rapide running horizontal mattress sutures.  A short arm thumb spica splint dressing was applied and she was awakened and taken to the recovery room in stable condition, breathing spontaneously  DISPOSITION: She will be discharged home today with typical instructions, return to the office in 10 to 15 days.  At that time, she should have new x-rays of the right thumb out of splint and we will likely cast her with a forearm-based thumb spica cast for an additional 2 to 3 weeks.

## 2018-08-10 NOTE — Discharge Instructions (Signed)
Discharge Instructions   You have a dressing with a plaster splint incorporated in it. Move your fingers as much as possible, making a full fist and fully opening the fist. Elevate your hand to reduce pain & swelling of the digits.  Ice over the operative site may be helpful to reduce pain & swelling.  DO NOT USE HEAT. You have baseline pain medicine at home.  Tylenol 650 mg every 6 hours should be taken as a baseline pain medicine. Take Mobic, 7.5 mg 1x per day. The Oxycodone can be taken additionally for severe breakthrough pain  Leave the dressing in place until you return to our office.  You may shower, but keep the bandage clean & dry.  You may drive a car when you are off of prescription pain medications and can safely control your vehicle with both hands. Call our office to schedule and appointment for 10-15 days from the date of surgery.   Please call [phone] during normal business hours or [phone] after hours for any problems. Including the following:  - excessive redness of the incisions - drainage for more than 4 days - fever of more than 101.5 F  *Please note that pain medications will not be refilled after hours or on weekends.   Post Anesthesia Home Care Instructions  Activity: Get plenty of rest for the remainder of the day. A responsible individual must stay with you for 24 hours following the procedure.  For the next 24 hours, DO NOT: -Drive a car -Paediatric nurse -Drink alcoholic beverages -Take any medication unless instructed by your physician -Make any legal decisions or sign important papers.  Meals: Start with liquid foods such as gelatin or soup. Progress to regular foods as tolerated. Avoid greasy, spicy, heavy foods. If nausea and/or vomiting occur, drink only clear liquids until the nausea and/or vomiting subsides. Call your physician if vomiting continues.  Special Instructions/Symptoms: Your throat may feel dry or sore from the anesthesia  or the breathing tube placed in your throat during surgery. If this causes discomfort, gargle with warm salt water. The discomfort should disappear within 24 hours.  If you had a scopolamine patch placed behind your ear for the management of post- operative nausea and/or vomiting:  1. The medication in the patch is effective for 72 hours, after which it should be removed.  Wrap patch in a tissue and discard in the trash. Wash hands thoroughly with soap and water. 2. You may remove the patch earlier than 72 hours if you experience unpleasant side effects which may include dry mouth, dizziness or visual disturbances. 3. Avoid touching the patch. Wash your hands with soap and water after contact with the patch.   Regional Anesthesia Blocks  1. Numbness or the inability to move the "blocked" extremity may last from 3-48 hours after placement. The length of time depends on the medication injected and your individual response to the medication. If the numbness is not going away after 48 hours, call your surgeon.  2. The extremity that is blocked will need to be protected until the numbness is gone and the  Strength has returned. Because you cannot feel it, you will need to take extra care to avoid injury. Because it may be weak, you may have difficulty moving it or using it. You may not know what position it is in without looking at it while the block is in effect.  3. For blocks in the legs and feet, returning to weight bearing and walking needs  to be done carefully. You will need to wait until the numbness is entirely gone and the strength has returned. You should be able to move your leg and foot normally before you try and bear weight or walk. You will need someone to be with you when you first try to ensure you do not fall and possibly risk injury.  4. Bruising and tenderness at the needle site are common side effects and will resolve in a few days.  5. Persistent numbness or new problems with  movement should be communicated to the surgeon or the [facility] [phone] [place] [phone]).

## 2018-08-10 NOTE — Anesthesia Procedure Notes (Signed)
Anesthesia Regional Block: Supraclavicular block   Pre-Anesthetic Checklist: ,, timeout performed, Correct Patient, Correct Site, Correct Laterality, Correct Procedure, Correct Position, site marked, Risks and benefits discussed,  Surgical consent,  Pre-op evaluation,  At surgeon's request and post-op pain management  Laterality: Right  Prep: chloraprep       Needles:  Injection technique: Single-shot  Needle Type: Stimiplex     Needle Length: 9cm  Needle Gauge: 21     Additional Needles:   Procedures:,,,, ultrasound used (permanent image in chart),,,,  Narrative:  Start time: 08/10/2018 8:08 AM End time: 08/10/2018 8:13 AM Injection made incrementally with aspirations every 5 mL.  Performed by: Personally  Anesthesiologist: Lynda Rainwater, MD

## 2018-08-10 NOTE — Interval H&P Note (Signed)
History and Physical Interval Note:  08/10/2018 7:42 AM  Linda Buckley  has presented today for surgery, with the diagnosis of RIGHT THUMB ARTHRITIS M18.11  The various methods of treatment have been discussed with the patient and family. After consideration of risks, benefits and other options for treatment, the patient has consented to  Procedure(s): RIGHT THUMB ARTHRODESIS (Right) as a surgical intervention .  The patient's history has been reviewed, patient examined, no change in status, stable for surgery.  I have reviewed the patient's chart and labs.  Questions were answered to the patient's satisfaction.     Jolyn Nap

## 2018-08-10 NOTE — Transfer of Care (Signed)
Immediate Anesthesia Transfer of Care Note  Patient: Linda Buckley  Procedure(s) Performed: RIGHT THUMB ARTHRODESIS (Right )  Patient Location: PACU  Anesthesia Type:General  Level of Consciousness: awake, alert , oriented and drowsy  Airway & Oxygen Therapy: Patient Spontanous Breathing and Patient connected to face mask oxygen  Post-op Assessment: Report given to RN and Post -op Vital signs reviewed and stable  Post vital signs: Reviewed and stable  Last Vitals:  Vitals Value Taken Time  BP    Temp    Pulse 69 08/10/2018 10:46 AM  Resp 14 08/10/2018 10:46 AM  SpO2 94 % 08/10/2018 10:46 AM  Vitals shown include unvalidated device data.  Last Pain:  Vitals:   08/10/18 0731  TempSrc: Oral  PainSc: 0-No pain         Complications: No apparent anesthesia complications

## 2018-08-10 NOTE — Anesthesia Procedure Notes (Addendum)
Procedure Name: LMA Insertion Date/Time: 08/10/2018 9:20 AM Performed by: Willa Frater, CRNA Pre-anesthesia Checklist: Patient identified, Emergency Drugs available, Suction available and Patient being monitored Patient Re-evaluated:Patient Re-evaluated prior to induction Oxygen Delivery Method: Circle system utilized Preoxygenation: Pre-oxygenation with 100% oxygen Induction Type: IV induction Ventilation: Mask ventilation without difficulty LMA: LMA inserted LMA Size: 4.0 Number of attempts: 1 Airway Equipment and Method: Bite block Placement Confirmation: positive ETCO2 Tube secured with: Tape Dental Injury: Teeth and Oropharynx as per pre-operative assessment

## 2018-08-10 NOTE — Progress Notes (Signed)
Assisted Dr. Miller with right, ultrasound guided, supraclavicular block. Side rails up, monitors on throughout procedure. See vital signs in flow sheet. Tolerated Procedure well. 

## 2018-08-11 NOTE — Anesthesia Postprocedure Evaluation (Signed)
Anesthesia Post Note  Patient: Linda Buckley  Procedure(s) Performed: RIGHT THUMB ARTHRODESIS (Right )     Patient location during evaluation: PACU Anesthesia Type: Regional and General Level of consciousness: awake and alert Pain management: pain level controlled Vital Signs Assessment: post-procedure vital signs reviewed and stable Respiratory status: spontaneous breathing, nonlabored ventilation and respiratory function stable Cardiovascular status: blood pressure returned to baseline and stable Postop Assessment: no apparent nausea or vomiting Anesthetic complications: no    Last Vitals:  Vitals:   08/10/18 1134 08/10/18 1200  BP: 125/78 128/86  Pulse: 65 (!) 58  Resp: 20 18  Temp:  36.5 C  SpO2: 91% 95%    Last Pain:  Vitals:   08/11/18 0919  TempSrc:   PainSc: 2    Pain Goal:                 Linda Buckley

## 2018-08-20 ENCOUNTER — Encounter (HOSPITAL_BASED_OUTPATIENT_CLINIC_OR_DEPARTMENT_OTHER): Payer: Self-pay | Admitting: Orthopedic Surgery

## 2018-08-27 ENCOUNTER — Telehealth: Payer: Self-pay | Admitting: Family Medicine

## 2018-08-27 DIAGNOSIS — Z0279 Encounter for issue of other medical certificate: Secondary | ICD-10-CM

## 2018-08-27 NOTE — Telephone Encounter (Signed)
Pt sister dropped off form to be completed, placed in bin upfront with charge sheet.

## 2018-08-27 NOTE — Telephone Encounter (Signed)
Paperwork given to pcp for completion.   

## 2018-08-28 ENCOUNTER — Other Ambulatory Visit: Payer: Self-pay | Admitting: Neurology

## 2018-08-31 ENCOUNTER — Other Ambulatory Visit: Payer: Self-pay | Admitting: Family Medicine

## 2018-09-02 ENCOUNTER — Other Ambulatory Visit: Payer: Self-pay | Admitting: Family Medicine

## 2018-09-07 ENCOUNTER — Encounter: Payer: Self-pay | Admitting: Adult Health

## 2018-09-07 ENCOUNTER — Encounter

## 2018-09-07 ENCOUNTER — Ambulatory Visit: Payer: BLUE CROSS/BLUE SHIELD | Admitting: Adult Health

## 2018-09-07 ENCOUNTER — Encounter: Payer: Self-pay | Admitting: General Practice

## 2018-09-07 VITALS — BP 130/86 | HR 80 | Ht 66.0 in | Wt 250.4 lb

## 2018-09-07 DIAGNOSIS — M797 Fibromyalgia: Secondary | ICD-10-CM | POA: Diagnosis not present

## 2018-09-07 DIAGNOSIS — G4733 Obstructive sleep apnea (adult) (pediatric): Secondary | ICD-10-CM

## 2018-09-07 DIAGNOSIS — Z9989 Dependence on other enabling machines and devices: Secondary | ICD-10-CM | POA: Diagnosis not present

## 2018-09-07 NOTE — Patient Instructions (Signed)
Your Plan:  Continue Lyrica Try using CPAP nightly and >4 hours each night Mask refitting If your symptoms worsen or you develop new symptoms please let us know.   Thank you for coming to see [name] at [facility]. I hope we have been able to provide you high quality care today.  You may receive a patient satisfaction survey over the next few weeks. We would appreciate your feedback and comments so that we may continue to improve ourselves and the health of our patients.

## 2018-09-07 NOTE — Telephone Encounter (Signed)
Received forms from back and faxed. °

## 2018-09-07 NOTE — Progress Notes (Signed)
I have read the note, and I agree with the clinical assessment and plan.  Nyzaiah Kai K Christerpher Clos   

## 2018-09-07 NOTE — Progress Notes (Signed)
PATIENT: Linda Buckley DOB: October 26, 1956  REASON FOR VISIT: follow up HISTORY FROM: patient  HISTORY OF PRESENT ILLNESS: Today 09/07/18:  Linda Buckley is a 62 year old female with a history of fibromyalgia and obstructive sleep apnea on CPAP.  She returns today for follow-up.  At the last visit Lyrica was increased.  She states that this is working well for her fibromyalgia but is unsure if it caused her more sleepiness.  The patient is also on a CPAP machine.  Her download indicates that she use her machine 42 out of 60 days for compliance of 70%.  She used her machine greater than 4 hours 39 days for compliance of 65%.  On average she uses her machine 7 hours and 15 minutes.  Her residual AHI is 6.1 on 5 to 12 cm of water with EPR of 3.  Her leak in the 95th percentile is 42.9 L/min.  She returns today for follow-up.  HISTORY Linda Buckley is a 62 year old right-handed black female with a history of fibromyalgia.  The patient is followed through the [facility], she gets Lyrica and Cymbalta through this office.  The patient is still having quite a bit of pain, she went to the emergency room around 23 October 2017 with episodes of dizziness.  The episodes have improved but still do occur on occasion.  She was given some meclizine to take.  MRI of the brain that was done on that date was unremarkable.  The patient has gone through some physical therapy, she has some exercises that she will perform on her own.  She walks with a cane, she denies any falls.  She returns to the office today for an evaluation.  REVIEW OF SYSTEMS: Out of a complete 14 system review of symptoms, the patient complains only of the following symptoms, and all other reviewed systems are negative.  See HPI  ALLERGIES: Allergies  Allergen Reactions  . Aspirin Swelling    Sweating/ swelling of hands and face   . Bee Venom Swelling    Swelling at site     HOME MEDICATIONS: Outpatient Medications Prior to  Visit  Medication Sig Dispense Refill  . acetaminophen (TYLENOL) 325 MG tablet Take 2 tablets (650 mg total) by mouth every 6 (six) hours.    Marland Kitchen buPROPion 450 MG TB24 Take 450 mg by mouth daily. 90 tablet 0  . cetirizine (ZYRTEC) 10 MG tablet Take 1 tablet (10 mg total) by mouth daily. 30 tablet 11  . Cholecalciferol (VITAMIN D PO) Take 1 tablet by mouth daily. 1000units    . clonazePAM (KLONOPIN) 0.5 MG tablet Take 1 tablet (0.5 mg total) by mouth at bedtime. 30 tablet 3  . diclofenac sodium (VOLTAREN) 1 % GEL APPLY 4 GRAMS EXTERNALLY TO THE AFFECTED AREA FOUR TIMES DAILY. 100 g 3  . DULoxetine (CYMBALTA) 60 MG capsule Take 1 capsule (60 mg total) by mouth 2 (two) times daily. 60 capsule 0  . furosemide (LASIX) 40 MG tablet 40 mg daily.  3  . losartan-hydrochlorothiazide (HYZAAR) 50-12.5 MG tablet TAKE 1 TABLET BY MOUTH EVERY DAY 90 tablet 0  . meloxicam (MOBIC) 7.5 MG tablet Take 1 tablet (7.5 mg total) by mouth daily. 30 tablet 1  . Multiple Vitamin (MULTIVITAMIN) capsule Take 1 capsule by mouth daily.    . pantoprazole (PROTONIX) 40 MG tablet Take 1 tablet (40 mg total) by mouth daily. 30 tablet 0  . pregabalin (LYRICA) 200 MG capsule Take 1 capsule (200 mg total)  by mouth 2 (two) times daily. 180 capsule 0  . QVAR REDIHALER 80 MCG/ACT inhaler INHALE 1 PUFF TWICE DAILY 10.6 g 0  . traZODone (DESYREL) 50 MG tablet TAKE 1/2 TO 1 TABLET(25 TO 50 MG) BY MOUTH AT BEDTIME AS NEEDED FOR SLEEP 30 tablet 0  . VENTOLIN HFA 108 (90 Base) MCG/ACT inhaler INHALE 2 PUFFS BY MOUTH EVERY 4 HOURS AS NEEDED FOR SHORTNESS OF BREATH 36 g 0  . vitamin E 1000 UNIT capsule Take by mouth.    . furosemide (LASIX) 20 MG tablet TAKE 1 TABLET(20 MG) BY MOUTH DAILY 30 tablet 6  . Oxycodone HCl 10 MG TABS TK 1 T PO Q 8 H PRN P  0  . DULoxetine (CYMBALTA) 60 MG capsule TAKE ONE CAPSULE BY MOUTH TWICE DAILY 180 capsule 0   No facility-administered medications prior to visit.     PAST MEDICAL HISTORY: Past Medical  History:  Diagnosis Date  . Allergy   . Anxiety   . Arthritis   . Carpal tunnel syndrome of right wrist    RECURRENT  . Depression   . Dysphagia 09/02/2017  . Edema 09/02/2017  . Environmental allergies    dust mites, pollen, roaches and other insects, mold  . Fibromyalgia 04/2016  . Headache    migraines  . Hereditary and idiopathic peripheral neuropathy 04/03/2016  . History of adenomatous polyp of colon   . History of gastric ulcer   . History of kidney stones   . Hypertension   . Pneumonia   . Sleep apnea    wears CPAP  . Wears glasses     PAST SURGICAL HISTORY: Past Surgical History:  Procedure Laterality Date  . CARPAL TUNNEL RELEASE Right 2007  . CARPAL TUNNEL RELEASE Right 07/05/2015   Procedure: RIGHT HAND REVISION CARPAL TUNNEL RELEASE;  Surgeon: Iran Planas, MD;  Location: [facility];  Service: Orthopedics;  Laterality: Right;  . CARPAL TUNNEL RELEASE Left 09/2016  . CERVICAL DISC ARTHROPLASTY  03/2015  . COLONOSCOPY W/ POLYPECTOMY  04-15-2014  . CYSTO/  RIGHT RETROGRADE PYELOGRAM/ URETEROSCOPY STONE EXTRACTION/  STENT PLACEMENT  06-07-2010  . FINGER ARTHRODESIS Right 08/10/2018   Procedure: RIGHT THUMB ARTHRODESIS;  Surgeon: Milly Jakob, MD;  Location: [facility];  Service: Orthopedics;  Laterality: Right;  . LAPAROSCOPIC ASSISTED VAGINAL HYSTERECTOMY  04-25-2004   ovaries remain  . TUBAL LIGATION  1982    FAMILY HISTORY: Family History  Problem Relation Age of Onset  . Asthma Father   . Diabetes Father   . Arthritis Mother   . Hypertension Mother   . Diabetes Mother   . Cancer Mother   . Stomach cancer Mother   . Diabetes Brother   . Diabetes Brother   . Asthma Son   . Alcohol abuse Son   . Anxiety disorder Son   . Schizophrenia Son   . Cancer Maternal Grandfather        colon  . Colon cancer Maternal Grandfather   . Cancer Maternal Aunt        breast  . Cancer Maternal Uncle        lung  . Colon cancer Maternal Uncle   . Cancer  Maternal Grandmother        ovarian  . Diabetes Son     SOCIAL HISTORY: Social History   Socioeconomic History  . Marital status: Married    Spouse name: Not on file  . Number of children: 2  . Years of education: 2  yrs college  . Highest education level: Not on file  Occupational History  . 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 Financial resource strain: Very hard  . Food insecurity:    Worry: Often true    Inability: Often true  . Transportation needs:    Medical: No    Non-medical: No  Tobacco Use  . Smoking status: Never Smoker  . Smokeless tobacco: Never Used  Substance and Sexual Activity  . Alcohol use: Not Currently    Comment: 2 glasses of wine weekly - reports none in 2 years  . Drug use: No    Comment: prescription for klonopin and oxy IR  . Sexual activity: Yes    Partners: Male    Birth control/protection: None    Comment: partial hysterectomy  Lifestyle  . Physical activity:    Days per week: 0 days    Minutes per session: 0 min  . Stress: Rather much  Relationships  . Social connections:    Talks on phone: More than three times a week    Gets together: Once a week    Attends religious service: More than 4 times per year    Active member of club or organization: Yes    Attends meetings of clubs or organizations: 1 to 4 times per year    Relationship status: Married  . Intimate partner violence:    Fear of current or ex partner: No    Emotionally abused: No    Physically abused: No    Forced sexual activity: No  Other Topics Concern  . Not on file  Social History Narrative   Lives at home w/ her husband   Right-handed   Drinks 2 cups of coffee weekly    3 bottle water per day      PHYSICAL EXAM  Vitals:   09/07/18 1053  BP: 130/86  Pulse: 80  Weight: 250 lb 6.4 oz (113.6 kg)  Height: 5\' 6"  (1.676 m)   Body mass index is 40.42 kg/m.  Generalized: Well developed, in no acute distress   Neurological  examination  Mentation: Alert oriented to time, place, history taking. Follows all commands speech and language fluent Cranial nerve II-XII: Pupils were equal round reactive to light. Extraocular movements were full, visual field were full on confrontational test. Facial sensation and strength were normal. Uvula tongue midline. Head turning and shoulder shrug  were normal and symmetric. Motor: The motor testing reveals 5 over 5 strength of all 4 extremities. Good symmetric motor tone is noted throughout.  Sensory: Sensory testing is intact to soft touch on all 4 extremities. No evidence of extinction is noted.  Coordination: Cerebellar testing reveals good finger-nose-finger and heel-to-shin bilaterally.  Gait and station: Patient uses a cane when ambulating. Reflexes: Deep tendon reflexes are symmetric and normal bilaterally.   DIAGNOSTIC DATA (LABS, IMAGING, TESTING) - I reviewed patient records, labs, notes, testing and imaging myself where available.  Lab Results  Component Value Date   WBC 4.1 06/17/2018   HGB 12.2 06/17/2018   HCT 37.1 06/17/2018   MCV 90.1 06/17/2018   PLT 229.0 06/17/2018      Component Value Date/Time   NA 141 08/04/2018 1230   K 4.3 08/04/2018 1230   CL 106 08/04/2018 1230   CO2 29 08/04/2018 1230   GLUCOSE 126 (H) 08/04/2018 1230   BUN 19 08/04/2018 1230   CREATININE 1.10 (H) 08/04/2018 1230   CREATININE 0.78 04/22/2016 1454  CALCIUM 9.4 08/04/2018 1230   PROT 6.5 06/17/2018 1124   PROT 6.6 04/03/2016 1018   ALBUMIN 4.1 06/17/2018 1124   AST 12 06/17/2018 1124   ALT 12 06/17/2018 1124   ALKPHOS 75 06/17/2018 1124   BILITOT 0.4 06/17/2018 1124   GFRNONAA 54 (L) 08/04/2018 1230   GFRAA >60 08/04/2018 1230   Lab Results  Component Value Date   CHOL 183 06/17/2018   HDL 51.10 06/17/2018   LDLCALC 97 06/17/2018   TRIG 175.0 (H) 06/17/2018   CHOLHDL 4 06/17/2018   Lab Results  Component Value Date   HGBA1C 6.0 11/05/2017   Lab Results    Component Value Date   VITAMINB12 427 07/03/2017   Lab Results  Component Value Date   TSH 0.53 06/17/2018      ASSESSMENT AND PLAN 62 y.o. year old female  has a past medical history of Allergy, Anxiety, Arthritis, Carpal tunnel syndrome of right wrist, Depression, Dysphagia (09/02/2017), Edema (09/02/2017), Environmental allergies, Fibromyalgia (04/2016), Headache, Hereditary and idiopathic peripheral neuropathy (04/03/2016), History of adenomatous polyp of colon, History of gastric ulcer, History of kidney stones, Hypertension, Pneumonia, Sleep apnea, and Wears glasses. here with:  1.  Fibromyalgia 2.  Obstructive sleep apnea on CPAP  The patient CPAP download shows suboptimal compliance.  Her residual AHI is slightly elevated and she has a significant leak.  I will send the patient for mask refitting.  Her daytime sleepiness and fatigue may be due to obstructive sleep apnea.  Advised patient that if she is compliant with CPAP this may improve however if it does not she should let us know.  She will continue on Lyrica 200 mg twice a day.  In the future this dose may have to be decreased if she continues to have daytime sleepiness.  Patient voiced understanding.  She will follow-up in 6 months or sooner if needed.   Ward Givens, MSN, NP-C 09/07/2018, 10:54 AM [facility] [address] [place], [place] [address] [phone]

## 2018-09-07 NOTE — Telephone Encounter (Signed)
Papers attached and placed in bin for front desk.

## 2018-09-07 NOTE — Progress Notes (Signed)
Community message sent to [facility] via Epic re: CPAP, mask refitting order.

## 2018-09-07 NOTE — Telephone Encounter (Signed)
Completed.  Needs med list and problem list printed and attached.

## 2018-09-14 ENCOUNTER — Telehealth: Payer: Self-pay | Admitting: Family Medicine

## 2018-09-14 MED ORDER — VALSARTAN-HYDROCHLOROTHIAZIDE 80-12.5 MG PO TABS: 1.0000 | ORAL_TABLET | Freq: Every day | ORAL | 3 refills | Status: DC

## 2018-09-14 NOTE — Telephone Encounter (Signed)
Can switch to Valsartan HCTZ 80/12.5mg  daily, #30, 3 refills

## 2018-09-14 NOTE — Telephone Encounter (Signed)
Copied from [place] [phone]. Topic: Quick Communication - Rx Refill/Question >> Sep 14, 2018  1:16 PM Linda Buckley wrote: Medication: losartan-hydrochlorothiazide (HYZAAR) 50-12.5 MG tablet [[id number]]   Pt called Buckley/c she got an email from her pharmacy that the medication above was recalled; pt is needing an alternate medication; contact pt if needed or send new medication to be filled

## 2018-09-14 NOTE — Telephone Encounter (Signed)
Please advise 

## 2018-09-14 NOTE — Telephone Encounter (Signed)
Medication filled to pharmacy as requested.  Pt informed.   

## 2018-09-24 ENCOUNTER — Other Ambulatory Visit: Payer: Self-pay | Admitting: Family Medicine

## 2018-09-24 NOTE — Telephone Encounter (Signed)
shoul pt still be on this. Note says source prescription was d/c on 05/08/18 at discharge

## 2018-09-25 ENCOUNTER — Other Ambulatory Visit (HOSPITAL_COMMUNITY): Payer: Self-pay | Admitting: Family

## 2018-10-01 ENCOUNTER — Other Ambulatory Visit: Payer: Self-pay | Admitting: Family Medicine

## 2018-10-06 ENCOUNTER — Other Ambulatory Visit: Payer: Self-pay | Admitting: Family Medicine

## 2018-10-07 ENCOUNTER — Ambulatory Visit (INDEPENDENT_AMBULATORY_CARE_PROVIDER_SITE_OTHER): Payer: BLUE CROSS/BLUE SHIELD | Admitting: Psychiatry

## 2018-10-07 ENCOUNTER — Encounter (HOSPITAL_COMMUNITY): Payer: Self-pay | Admitting: Psychiatry

## 2018-10-07 VITALS — BP 122/85 | HR 64 | Ht 66.0 in | Wt 254.0 lb

## 2018-10-07 DIAGNOSIS — F411 Generalized anxiety disorder: Secondary | ICD-10-CM

## 2018-10-07 DIAGNOSIS — F4310 Post-traumatic stress disorder, unspecified: Secondary | ICD-10-CM

## 2018-10-07 DIAGNOSIS — F331 Major depressive disorder, recurrent, moderate: Secondary | ICD-10-CM | POA: Diagnosis not present

## 2018-10-07 DIAGNOSIS — M797 Fibromyalgia: Secondary | ICD-10-CM

## 2018-10-07 MED ORDER — BUPROPION HCL ER (XL) 300 MG PO TB24: 300.0000 mg | ORAL_TABLET | ORAL | 1 refills | Status: DC

## 2018-10-07 MED ORDER — BUPROPION HCL ER (XL) 150 MG PO TB24: 150.0000 mg | ORAL_TABLET | Freq: Every day | ORAL | 1 refills | Status: DC

## 2018-10-07 MED ORDER — DULOXETINE HCL 60 MG PO CPEP: 60.0000 mg | ORAL_CAPSULE | Freq: Two times a day (BID) | ORAL | 1 refills | Status: DC
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
## 2018-10-14 ENCOUNTER — Encounter (HOSPITAL_COMMUNITY): Payer: Self-pay | Admitting: Licensed Clinical Social Worker

## 2018-10-14 ENCOUNTER — Other Ambulatory Visit: Payer: Self-pay | Admitting: Neurology

## 2018-10-14 ENCOUNTER — Ambulatory Visit (INDEPENDENT_AMBULATORY_CARE_PROVIDER_SITE_OTHER): Payer: BLUE CROSS/BLUE SHIELD | Admitting: Licensed Clinical Social Worker

## 2018-10-14 DIAGNOSIS — F331 Major depressive disorder, recurrent, moderate: Secondary | ICD-10-CM | POA: Diagnosis not present

## 2018-10-14 NOTE — Progress Notes (Signed)
   THERAPIST PROGRESS NOTE  Session Time: 4:30-5:20pm  Participation Level: Active  Behavioral Response: CasualAlert/Depressed  Type of Therapy: Individual Therapy  Treatment Goals addressed:   Interventions: Motivational Interviewing  Summary: Linda Buckley is a 62 y.o. female who presents for her individual counseling session in almost 3 months. Pt reports she has had a lot of medical issues going on and now wants to come back to therapy regularly. She is having gastric sleeve surgery on 11/11. Discussed with pt her reasoning behind having the surgery. Asked open ended questions. Pt reports since she has not been to therapy recently she is having more anxiety and depression. She recently saw Dr. Adele Schilder for medication management. Educated pt on the purpose, process and practice of mindfulness techniques. Pt is in the process also of moving from her apt. She is going to move in temporarily with her parents in [place]. Her husband will stay with his sister until they can find suitable, affordable housing. Pt is struggling with her marriage. Her husband is an alcoholic and is not responsible in money matters. Pt received disability so $ is one less stressor for her currently. Asked open ended questions and used empathic reflection. Discussed the importance of self care during her stressful times.      Suicidal/Homicidal: Nowithout intent/plan  Therapist Response:Assessed pt's current functioning and reviewed progress. Assisted pt processing return to therapy, importance of medication management,  Upcoming surgery, marital issues, self care. Assisted pt processing the management of her stressors.  Plan: Return again in 2 weeks.  Diagnosis: Axis I: Severe Episode of MDD without psychotic features    Basilia Stuckert S, LCAS 10/14/2018

## 2018-10-15 ENCOUNTER — Other Ambulatory Visit: Payer: Self-pay | Admitting: Family Medicine

## 2018-10-26 HISTORY — PX: LAPAROSCOPIC GASTRIC BAND REMOVAL WITH LAPAROSCOPIC GASTRIC SLEEVE RESECTION: SHX6498

## 2018-10-28 MED ORDER — SIMETHICONE 40 MG/0.6ML PO SUSP
80.00 | ORAL | Status: DC
Start: ? — End: 2018-10-28

## 2018-10-28 MED ORDER — SODIUM CHLORIDE 0.9 % IV SOLN
1000.00 | INTRAVENOUS | Status: DC
Start: ? — End: 2018-10-28

## 2018-10-28 MED ORDER — CELECOXIB 200 MG PO CAPS
200.00 | ORAL_CAPSULE | ORAL | Status: DC
Start: 2018-10-27 — End: 2018-10-28

## 2018-10-28 MED ORDER — CLONIDINE HCL 0.1 MG PO TABS
0.10 | ORAL_TABLET | ORAL | Status: DC
Start: ? — End: 2018-10-28

## 2018-10-28 MED ORDER — SODIUM CHLORIDE 0.9 % IV SOLN
10.00 | INTRAVENOUS | Status: DC
Start: ? — End: 2018-10-28

## 2018-10-28 MED ORDER — PANTOPRAZOLE SODIUM 40 MG PO TBEC
40.00 | DELAYED_RELEASE_TABLET | ORAL | Status: DC
Start: 2018-10-27 — End: 2018-10-28

## 2018-10-28 MED ORDER — SODIUM CHLORIDE 0.9 % IV SOLN
100.00 | INTRAVENOUS | Status: DC
Start: ? — End: 2018-10-28

## 2018-10-28 MED ORDER — GENERIC EXTERNAL MEDICATION
10.00 | Status: DC
Start: ? — End: 2018-10-28

## 2018-10-28 MED ORDER — DEXAMETHASONE SODIUM PHOSPHATE 10 MG/ML IJ SOLN
10.00 | INTRAMUSCULAR | Status: DC
Start: ? — End: 2018-10-28

## 2018-10-28 MED ORDER — TRAZODONE HCL 50 MG PO TABS
50.00 | ORAL_TABLET | ORAL | Status: DC
Start: 2018-10-27 — End: 2018-10-28

## 2018-10-28 MED ORDER — SUCRALFATE 1 GM/10ML PO SUSP
1.00 | ORAL | Status: DC
Start: 2018-10-27 — End: 2018-10-28

## 2018-10-28 MED ORDER — CLONAZEPAM 0.5 MG PO TABS
.50 | ORAL_TABLET | ORAL | Status: DC
Start: ? — End: 2018-10-28

## 2018-10-28 MED ORDER — GENERIC EXTERNAL MEDICATION
25.00 | Status: DC
Start: ? — End: 2018-10-28

## 2018-10-28 MED ORDER — GENERIC EXTERNAL MEDICATION
200.00 | Status: DC
Start: 2018-10-27 — End: 2018-10-28

## 2018-10-28 MED ORDER — DULOXETINE HCL 30 MG PO CPEP
60.00 | ORAL_CAPSULE | ORAL | Status: DC
Start: 2018-10-27 — End: 2018-10-28

## 2018-10-28 MED ORDER — ONDANSETRON HCL 4 MG PO TABS
4.00 | ORAL_TABLET | ORAL | Status: DC
Start: ? — End: 2018-10-28

## 2018-10-28 MED ORDER — GENERIC EXTERNAL MEDICATION
0.08 | Status: DC
Start: ? — End: 2018-10-28

## 2018-10-28 MED ORDER — GENERIC EXTERNAL MEDICATION
0.04 | Status: DC
Start: ? — End: 2018-10-28

## 2018-10-28 MED ORDER — HEPARIN SODIUM (PORCINE) 5000 UNIT/ML IJ SOLN
5000.00 | INTRAMUSCULAR | Status: DC
Start: 2018-10-27 — End: 2018-10-28

## 2018-10-28 MED ORDER — BUPROPION HCL ER (XL) 150 MG PO TB24
450.00 | ORAL_TABLET | ORAL | Status: DC
Start: 2018-10-28 — End: 2018-10-28

## 2018-10-28 MED ORDER — NALOXONE HCL 0.4 MG/ML IJ SOLN
0.40 | INTRAMUSCULAR | Status: DC
Start: ? — End: 2018-10-28

## 2018-10-28 MED ORDER — ONDANSETRON HCL 4 MG/2ML IJ SOLN
8.00 | INTRAMUSCULAR | Status: DC
Start: ? — End: 2018-10-28

## 2018-10-28 MED ORDER — DIPHENHYDRAMINE HCL 50 MG/ML IJ SOLN
25.00 | INTRAMUSCULAR | Status: DC
Start: ? — End: 2018-10-28

## 2018-10-28 MED ORDER — ESCITALOPRAM OXALATE 10 MG PO TABS
10.00 | ORAL_TABLET | ORAL | Status: DC
Start: 2018-10-28 — End: 2018-10-28

## 2018-11-02 ENCOUNTER — Other Ambulatory Visit: Payer: Self-pay | Admitting: Family Medicine

## 2018-11-11 ENCOUNTER — Encounter (HOSPITAL_COMMUNITY): Payer: Self-pay | Admitting: Licensed Clinical Social Worker

## 2018-11-11 ENCOUNTER — Ambulatory Visit (INDEPENDENT_AMBULATORY_CARE_PROVIDER_SITE_OTHER): Payer: BLUE CROSS/BLUE SHIELD | Admitting: Licensed Clinical Social Worker

## 2018-11-11 DIAGNOSIS — F411 Generalized anxiety disorder: Secondary | ICD-10-CM

## 2018-11-11 DIAGNOSIS — F331 Major depressive disorder, recurrent, moderate: Secondary | ICD-10-CM

## 2018-11-11 NOTE — Progress Notes (Signed)
   THERAPIST PROGRESS NOTE  Session Time: 2:40-3:30pm  Participation Level: Active  Behavioral Response: CasualAlert/Depressed  Type of Therapy: Individual Therapy  Treatment Goals addressed: Improve Psychiatric Symptoms, elevate mood (increased self-esteem, increased self-compassion, increased interaction), improve unhelpful thought patterns, controlled behavior, moderate mood, deliberate speech and thought process(improved social functioning, healthy adjustment to living situation), Learn about diagnosis, healthy coping skills.  Interventions: CBT  Summary: Linda Buckley is a 62 y.o. female who presents for her individual counseling session. Pt had her gastric sleeve surgery 11/11. She looks and feels wonderful. She and her husband had a discussion and decided to buy a house through the GI bill. They will close 12/19. The house is in [place] and is really nice. She and him have decided they want to make their marriage work and work together.  She will not have to move in with her parents. She reports few depressive and anxious days. She continues to have days of "cry days." Discussed those days. She has begun to limit people in her inner circle. Discussed relationship circles with pt. Asked open ended questions. Pt has appt with Dr. Adele Schilder next week for medication management. Pt reports her husband has cut back on his drinking, down to 2 days a week. Pt received $40,000. In back pay for disability. She also receives almost $1,700/mo. In disability. Encouraged pt to contact her bank [employer] for a Music therapist for investment information.      Suicidal/Homicidal: Nowithout intent/plan  Therapist Response:Assessed pt's current functioning and reviewed progress. Assisted pt processing pt's surgery,  marital issues, buying house,depressive days, medication management, disability. Assisted pt processing the management of her stressors.  Plan: Return again in 2 weeks.  Diagnosis: Axis  I: Severe Episode of MDD without psychotic features, GAD    MACKENZIE,LISBETH S, LCAS 11/11/2018

## 2018-11-18 ENCOUNTER — Ambulatory Visit (HOSPITAL_COMMUNITY): Payer: BLUE CROSS/BLUE SHIELD | Admitting: Psychiatry

## 2018-11-24 ENCOUNTER — Other Ambulatory Visit: Payer: Self-pay | Admitting: Family Medicine

## 2018-12-01 ENCOUNTER — Other Ambulatory Visit: Payer: Self-pay | Admitting: Family Medicine

## 2018-12-02 ENCOUNTER — Ambulatory Visit (HOSPITAL_COMMUNITY): Payer: BLUE CROSS/BLUE SHIELD | Admitting: Licensed Clinical Social Worker

## 2018-12-06 ENCOUNTER — Other Ambulatory Visit: Payer: Self-pay | Admitting: Family Medicine

## 2018-12-20 ENCOUNTER — Other Ambulatory Visit: Payer: Self-pay | Admitting: Neurology

## 2018-12-20 DIAGNOSIS — F331 Major depressive disorder, recurrent, moderate: Secondary | ICD-10-CM

## 2018-12-20 DIAGNOSIS — F411 Generalized anxiety disorder: Secondary | ICD-10-CM

## 2018-12-25 ENCOUNTER — Telehealth: Payer: Self-pay | Admitting: Family Medicine

## 2018-12-25 ENCOUNTER — Encounter: Payer: Self-pay | Admitting: Family Medicine

## 2018-12-25 NOTE — Telephone Encounter (Signed)
Pt is scheduled °

## 2018-12-25 NOTE — Telephone Encounter (Signed)
Can we work this patient in before May? Please advise    Copied from [place] [id number]. Topic: Appointment Scheduling - Scheduling Inquiry for Clinic >> Dec 25, 2018  9:01 AM Conception Chancy, NT wrote: Reason for CRM: patient is needing to reschedule her physical that was for today with Dr. Birdie Riddle. Next available is in May. Patient would like to be seen before then. Please advise.

## 2018-12-25 NOTE — Telephone Encounter (Signed)
Friday feb 14th at 11 am is ok to use.

## 2018-12-31 ENCOUNTER — Other Ambulatory Visit: Payer: Self-pay | Admitting: Neurology

## 2018-12-31 ENCOUNTER — Other Ambulatory Visit: Payer: Self-pay | Admitting: Family Medicine

## 2018-12-31 NOTE — Telephone Encounter (Signed)
Last OV 06/17/18 Clonazepam last filled 06/17/18 #30 with 3

## 2019-01-04 ENCOUNTER — Ambulatory Visit (HOSPITAL_COMMUNITY): Payer: BLUE CROSS/BLUE SHIELD | Admitting: Licensed Clinical Social Worker

## 2019-01-06 ENCOUNTER — Other Ambulatory Visit: Payer: Self-pay | Admitting: Family Medicine

## 2019-01-29 ENCOUNTER — Encounter: Payer: Self-pay | Admitting: Family Medicine

## 2019-01-29 ENCOUNTER — Ambulatory Visit (INDEPENDENT_AMBULATORY_CARE_PROVIDER_SITE_OTHER): Payer: Medicare Other | Admitting: Family Medicine

## 2019-01-29 ENCOUNTER — Other Ambulatory Visit: Payer: Self-pay

## 2019-01-29 VITALS — BP 120/80 | HR 75 | Temp 98.1°F | Resp 16 | Ht 66.0 in | Wt 213.4 lb

## 2019-01-29 DIAGNOSIS — F411 Generalized anxiety disorder: Secondary | ICD-10-CM

## 2019-01-29 DIAGNOSIS — E559 Vitamin D deficiency, unspecified: Secondary | ICD-10-CM | POA: Diagnosis not present

## 2019-01-29 DIAGNOSIS — I10 Essential (primary) hypertension: Secondary | ICD-10-CM | POA: Diagnosis not present

## 2019-01-29 DIAGNOSIS — Z0000 Encounter for general adult medical examination without abnormal findings: Secondary | ICD-10-CM | POA: Diagnosis not present

## 2019-01-29 DIAGNOSIS — F331 Major depressive disorder, recurrent, moderate: Secondary | ICD-10-CM

## 2019-01-29 DIAGNOSIS — Z23 Encounter for immunization: Secondary | ICD-10-CM | POA: Diagnosis not present

## 2019-01-29 LAB — HEPATIC FUNCTION PANEL
ALT: 10 U/L (ref 0–35)
AST: 13 U/L (ref 0–37)
Albumin: 3.9 g/dL (ref 3.5–5.2)
Alkaline Phosphatase: 67 U/L (ref 39–117)
BILIRUBIN DIRECT: 0.1 mg/dL (ref 0.0–0.3)
BILIRUBIN TOTAL: 0.5 mg/dL (ref 0.2–1.2)
Total Protein: 6.2 g/dL (ref 6.0–8.3)

## 2019-01-29 LAB — BASIC METABOLIC PANEL
BUN: 17 mg/dL (ref 6–23)
CO2: 29 mEq/L (ref 19–32)
Calcium: 9.5 mg/dL (ref 8.4–10.5)
Chloride: 108 mEq/L (ref 96–112)
Creatinine, Ser: 0.82 mg/dL (ref 0.40–1.20)
GFR: 85.95 mL/min (ref >60.00)
Glucose, Bld: 85 mg/dL (ref 70–99)
Potassium: 3.6 mEq/L (ref 3.5–5.1)
Sodium: 144 mEq/L (ref 135–145)

## 2019-01-29 LAB — LIPID PANEL
Cholesterol: 163 mg/dL (ref 0–200)
HDL: 49.1 mg/dL (ref >39.00)
LDL Cholesterol: 100 mg/dL — ABNORMAL HIGH (ref 0–99)
NonHDL: 113.87
Total CHOL/HDL Ratio: 3
Triglycerides: 69 mg/dL (ref 0.0–149.0)
VLDL: 13.8 mg/dL (ref 0.0–40.0)

## 2019-01-29 LAB — CBC WITH DIFFERENTIAL/PLATELET
BASOS PCT: 1 % (ref 0.0–3.0)
Basophils Absolute: 0 10*3/uL (ref 0.0–0.1)
Eosinophils Absolute: 0.1 10*3/uL (ref 0.0–0.7)
Eosinophils Relative: 2.9 % (ref 0.0–5.0)
HCT: 38 % (ref 36.0–46.0)
Hemoglobin: 12.6 g/dL (ref 12.0–15.0)
Lymphocytes Relative: 28.5 % (ref 12.0–46.0)
Lymphs Abs: 0.8 10*3/uL (ref 0.7–4.0)
MCHC: 33.2 g/dL (ref 30.0–36.0)
MCV: 89.3 fl (ref 78.0–100.0)
Monocytes Absolute: 0.3 10*3/uL (ref 0.1–1.0)
Monocytes Relative: 8.9 % (ref 3.0–12.0)
Neutro Abs: 1.7 10*3/uL (ref 1.4–7.7)
Neutrophils Relative %: 58.7 % (ref 43.0–77.0)
Platelets: 225 10*3/uL (ref 150.0–400.0)
RBC: 4.25 Mil/uL (ref 3.87–5.11)
RDW: 13.8 % (ref 11.5–15.5)
WBC: 2.9 10*3/uL — ABNORMAL LOW (ref 4.0–10.5)

## 2019-01-29 LAB — VITAMIN D 25 HYDROXY (VIT D DEFICIENCY, FRACTURES): VITD: 42.69 ng/mL (ref 30.00–100.00)

## 2019-01-29 LAB — TSH: TSH: 0.6 u[IU]/mL (ref 0.35–4.50)

## 2019-01-29 MED ORDER — BUPROPION HCL ER (XL) 300 MG PO TB24: 300.0000 mg | ORAL_TABLET | ORAL | 1 refills | Status: DC

## 2019-01-29 MED ORDER — BUPROPION HCL ER (XL) 150 MG PO TB24: 150.0000 mg | ORAL_TABLET | Freq: Every day | ORAL | 1 refills | Status: DC

## 2019-01-29 NOTE — Progress Notes (Signed)
   Subjective:    Patient ID: Linda Buckley, female    DOB: Oct 13, 1956, 63 y.o.   MRN: [id number]  HPI CPE- UTD on mammo, due for pap w/ Dr Valentino Saxon.  UTD on colonoscopy.  Will get flu today.  Is down over 40 lbs since last visit after bariatric surgery (gastric sleeve).  Pt is exercising and has changed diet.   Review of Systems Patient reports no vision/ hearing changes, adenopathy,fever, weight change,  persistant/recurrent hoarseness , swallowing issues, chest pain, palpitations, edema, persistant/recurrent cough, hemoptysis, dyspnea (rest/exertional/paroxysmal nocturnal), gastrointestinal bleeding (melena, rectal bleeding), abdominal pain, significant heartburn, bowel changes, GU symptoms (dysuria, hematuria, incontinence), Gyn symptoms (abnormal  bleeding, pain),  syncope, focal weakness, memory loss, numbness & tingling, skin/hair/nail changes, abnormal bruising or bleeding, anxiety, or depression.     Objective:   Physical Exam General Appearance:    Alert, cooperative, no distress, appears stated age, obese  Head:    Normocephalic, without obvious abnormality, atraumatic  Eyes:    PERRL, conjunctiva/corneas clear, EOM's intact, fundi    benign, both eyes  Ears:    Normal TM's and external ear canals, both ears  Nose:   Nares normal, septum midline, mucosa normal, no drainage    or sinus tenderness  Throat:   Lips, mucosa, and tongue normal; teeth and gums normal  Neck:   Supple, symmetrical, trachea midline, no adenopathy;    Thyroid: no enlargement/tenderness/nodules  Back:     Symmetric, no curvature, ROM normal, no CVA tenderness  Lungs:     Clear to auscultation bilaterally, respirations unlabored  Chest Wall:    No tenderness or deformity   Heart:    Regular rate and rhythm, S1 and S2 normal, no murmur, rub   or gallop  Breast Exam:    Deferred to GYN  Abdomen:     Soft, non-tender, bowel sounds active all four quadrants,    no masses, no organomegaly  Genitalia:     Deferred to GYN  Rectal:    Extremities:   Extremities normal, atraumatic, no cyanosis or edema  Pulses:   2+ and symmetric all extremities  Skin:   Skin color, texture, turgor normal, no rashes or lesions  Lymph nodes:   Cervical, supraclavicular, and axillary nodes normal  Neurologic:   CNII-XII intact, normal strength, sensation and reflexes    throughout          Assessment & Plan:  Depression- currently well controlled.  Refills on Wellbutrin provided.

## 2019-01-29 NOTE — Patient Instructions (Signed)
Follow up in 1 year or as needed We'll notify you of your lab results and make any changes if needed Call and schedule your pap w/ Dr Valentino Saxon Keep up the good work!  I am SO proud of you! Call with any questions or concerns Happy Valentine's Day!!

## 2019-01-29 NOTE — Assessment & Plan Note (Signed)
Chronic problem.  Excellent control since bariatric surgery.  She was able to stop all meds with her weight loss.  Check labs.  Will follow.

## 2019-01-29 NOTE — Assessment & Plan Note (Signed)
Pt has hx of this.  Check labs and replete prn. 

## 2019-01-29 NOTE — Assessment & Plan Note (Signed)
Pt's PE WNL w/ exception of obesity.  UTD on mammo, due for pap.  Flu shot given.  Check labs.  Anticipatory guidance provided.

## 2019-02-01 ENCOUNTER — Encounter: Payer: Self-pay | Admitting: Neurology

## 2019-02-01 ENCOUNTER — Encounter: Payer: Self-pay | Admitting: General Practice

## 2019-02-01 ENCOUNTER — Ambulatory Visit (INDEPENDENT_AMBULATORY_CARE_PROVIDER_SITE_OTHER): Payer: Medicare Other | Admitting: Neurology

## 2019-02-01 VITALS — BP 118/78 | HR 74 | Ht 66.0 in | Wt 216.0 lb

## 2019-02-01 DIAGNOSIS — M797 Fibromyalgia: Secondary | ICD-10-CM

## 2019-02-01 DIAGNOSIS — R413 Other amnesia: Secondary | ICD-10-CM

## 2019-02-01 DIAGNOSIS — E538 Deficiency of other specified B group vitamins: Secondary | ICD-10-CM | POA: Diagnosis not present

## 2019-02-01 MED ORDER — PREGABALIN 100 MG PO CAPS: ORAL_CAPSULE | ORAL | 1 refills | Status: DC

## 2019-02-01 NOTE — Patient Instructions (Signed)
With the lyrica we will conver to the 100 mg capsules taking one in the morning and 2 in the evening.

## 2019-02-01 NOTE — Progress Notes (Signed)
Reason for visit: Fibromyalgia, sleep apnea, memory disturbance  Linda Buckley is an 63 y.o. female  History of present illness:  Linda Buckley is a 63 year old right-handed black female with a history of fibromyalgia.  The patient has had significant obesity, she has been diagnosed with sleep apnea on CPAP.  The patient claims that the CPAP has improved some of the daytime drowsiness, but she remains somewhat drowsy and staggery on the Lyrica.  She has tried to get into some low-grade exercise, she may walk half a mile twice a week.  She has gone from a weight of 251 in December 2018 to a weight of 216 today.  The patient has had some problems with dizziness off and on, this is associated with her balance issue, she has not had any falls.  The patient is no longer followed through the pain center.  She does have a spinal stimulator in place that seemed to help initially but no longer offers any benefit.  The patient has a lot of discomfort in the neck and shoulder area.  She has noted some problems with short-term memory issues recently.  She is not on B12 supplementation following a gastric sleeve procedure that was done in November 2019.  She returns to this office for an evaluation.  Past Medical History:  Diagnosis Date  . Allergy   . Anxiety   . Arthritis   . Carpal tunnel syndrome of right wrist    RECURRENT  . Depression   . Dysphagia 09/02/2017  . Edema 09/02/2017  . Environmental allergies    dust mites, pollen, roaches and other insects, mold  . Fibromyalgia 04/2016  . Headache    migraines  . Hereditary and idiopathic peripheral neuropathy 04/03/2016  . History of adenomatous polyp of colon   . History of gastric ulcer   . History of kidney stones   . Hypertension   . Pneumonia   . Sleep apnea    wears CPAP  . Wears glasses     Past Surgical History:  Procedure Laterality Date  . CARPAL TUNNEL RELEASE Right 2007  . CARPAL TUNNEL RELEASE Right 07/05/2015   Procedure: RIGHT HAND REVISION CARPAL TUNNEL RELEASE;  Surgeon: Iran Planas, MD;  Location: [facility];  Service: Orthopedics;  Laterality: Right;  . CARPAL TUNNEL RELEASE Left 09/2016  . CERVICAL DISC ARTHROPLASTY  03/2015  . COLONOSCOPY W/ POLYPECTOMY  04-15-2014  . CYSTO/  RIGHT RETROGRADE PYELOGRAM/ URETEROSCOPY STONE EXTRACTION/  STENT PLACEMENT  06-07-2010  . FINGER ARTHRODESIS Right 08/10/2018   Procedure: RIGHT THUMB ARTHRODESIS;  Surgeon: Milly Jakob, MD;  Location: [place];  Service: Orthopedics;  Laterality: Right;  . LAPAROSCOPIC ASSISTED VAGINAL HYSTERECTOMY  04-25-2004   ovaries remain  . LAPAROSCOPIC GASTRIC BAND REMOVAL WITH LAPAROSCOPIC GASTRIC SLEEVE RESECTION  10/26/2018  . TUBAL LIGATION  1982    Family History  Problem Relation Age of Onset  . Asthma Father   . Diabetes Father   . Arthritis Mother   . Hypertension Mother   . Diabetes Mother   . Cancer Mother   . Stomach cancer Mother   . Diabetes Brother   . Diabetes Brother   . Asthma Son   . Alcohol abuse Son   . Anxiety disorder Son   . Schizophrenia Son   . Cancer Maternal Grandfather        colon  . Colon cancer Maternal Grandfather   . Cancer Maternal Aunt  breast  . Cancer Maternal Uncle        lung  . Colon cancer Maternal Uncle   . Cancer Maternal Grandmother        ovarian  . Diabetes Son     Social history:  reports that she has never smoked. She has never used smokeless tobacco. She reports previous alcohol use. She reports that she does not use drugs.    Allergies  Allergen Reactions  . Aspirin Swelling    Sweating/ swelling of hands and face   . Bee Venom Swelling    Swelling at site     Medications:  Prior to Admission medications   Medication Sig Start Date End Date Taking? Authorizing Provider  buPROPion (WELLBUTRIN XL) 150 MG 24 hr tablet Take 1 tablet (150 mg total) by mouth daily. 01/29/19  Yes Midge Minium, MD  buPROPion (WELLBUTRIN XL) 300  MG 24 hr tablet Take 1 tablet (300 mg total) by mouth every morning. 01/29/19 01/29/20 Yes Midge Minium, MD  cetirizine (ZYRTEC) 10 MG tablet TAKE 1 TABLET(10 MG) BY MOUTH DAILY 11/24/18  Yes Midge Minium, MD  Cholecalciferol (VITAMIN D PO) Take 1 tablet by mouth daily. 1000units   Yes [provider]  clonazePAM (KLONOPIN) 0.5 MG tablet TAKE 1 TABLET(0.5 MG) BY MOUTH AT BEDTIME 12/31/18  Yes Midge Minium, MD  DULoxetine (CYMBALTA) 60 MG capsule TAKE ONE CAPSULE BY MOUTH TWICE DAILY 12/21/18  Yes Kathrynn Ducking, MD  Multiple Vitamin (MULTIVITAMIN) capsule Take 1 capsule by mouth daily. With iron   Yes [provider]  pregabalin (LYRICA) 200 MG capsule TAKE ONE CAPSULE BY MOUTH TWICE DAILY 12/31/18  Yes Kathrynn Ducking, MD  QVAR REDIHALER 80 MCG/ACT inhaler INHALE 1 PUFF BY MOUTH TWICE DAILY 12/07/18  Yes Midge Minium, MD  traZODone (DESYREL) 50 MG tablet TAKE 1/2 TO 1 TABLET(25 TO 50 MG) BY MOUTH AT BEDTIME AS NEEDED FOR SLEEP 01/06/19  Yes Midge Minium, MD  VENTOLIN HFA 108 (90 Base) MCG/ACT inhaler INHALE 2 PUFFS BY MOUTH EVERY 4 HOURS AS NEEDED FOR SHORTNESS OF BREATH 07/03/18  Yes Midge Minium, MD  vitamin E 1000 UNIT capsule Take 2,000 Units by mouth.    Yes [provider]    ROS:  Out of a complete 14 system review of symptoms, the patient complains only of the following symptoms, and all other reviewed systems are negative.  Memory disturbance Fatigue Chronic pain  Blood pressure 118/78, pulse 74, height 5\' 6"  (1.676 m), weight 216 lb (98 kg), SpO2 94 %.  Physical Exam  General: The patient is alert and cooperative at the time of the examination.  The patient is obese.  Neuromuscular: The patient is able to generate full flexion of the low back.  Skin: No significant peripheral edema is noted.   Neurologic Exam  Mental status: The patient is alert and oriented x 3 at the time of the examination. The  Mini-Mental status examination done today shows a total score of 28/30.   Cranial nerves: Facial symmetry is present. Speech is normal, no aphasia or dysarthria is noted. Extraocular movements are full. Visual fields are full.  Motor: The patient has good strength in all 4 extremities.  Sensory examination: Soft touch sensation is symmetric on the face, arms, and legs.  Coordination: The patient has good finger-nose-finger and heel-to-shin bilaterally.  Gait and station: The patient has a normal gait. Tandem gait is slightly unsteady. Romberg is negative, but  is unsteady. No drift is seen.  Reflexes: Deep tendon reflexes are symmetric.   Assessment/Plan:  1.  Obesity  2.  Sleep apnea on CPAP  3.  Fibromyalgia  4.  Reported memory disturbance  5.  History of dizziness  The patient remains somewhat drowsy and staggery on the Lyrica, the dose will be dropped to 100 mg in the morning and 200 mg in the evening.  A prescription was sent in.  The patient will continue her low-grade exercise program.  Given the reports of memory issues, further blood work will be done today.  She will follow-up in 6 months.   Jill Alexanders MD 02/01/2019 7:21 AM  [facility] [address] [place], [place] [address]  Phone [phone] Fax [phone]

## 2019-02-02 ENCOUNTER — Telehealth: Payer: Self-pay | Admitting: *Deleted

## 2019-02-02 LAB — SEDIMENTATION RATE: SED RATE: 13 mm/h (ref 0–40)

## 2019-02-02 LAB — VITAMIN B12: Vitamin B-12: 420 pg/mL (ref 232–1245)

## 2019-02-02 LAB — HIV ANTIBODY (ROUTINE TESTING W REFLEX): HIV Screen 4th Generation wRfx: NONREACTIVE

## 2019-02-02 LAB — RPR: RPR Ser Ql: NONREACTIVE

## 2019-02-02 NOTE — Telephone Encounter (Signed)
I spoke to pt and relayed that her lab tests came back as unremarkable (normal results).  She verbalized understanding.

## 2019-02-03 ENCOUNTER — Other Ambulatory Visit: Payer: Self-pay | Admitting: Family Medicine

## 2019-02-04 ENCOUNTER — Ambulatory Visit (HOSPITAL_COMMUNITY): Payer: BLUE CROSS/BLUE SHIELD | Admitting: Licensed Clinical Social Worker

## 2019-02-15 ENCOUNTER — Other Ambulatory Visit: Payer: Self-pay | Admitting: Neurology

## 2019-02-15 NOTE — Telephone Encounter (Signed)
Pt is requesting refill for pregabalin (LYRICA) 100 MG capsule that was sent to [facility] be voided and new script sent to [facility].

## 2019-02-16 MED ORDER — PREGABALIN 100 MG PO CAPS: ORAL_CAPSULE | ORAL | 1 refills | Status: DC

## 2019-03-03 ENCOUNTER — Other Ambulatory Visit: Payer: Self-pay | Admitting: Family Medicine

## 2019-03-04 ENCOUNTER — Telehealth: Payer: Self-pay | Admitting: General Practice

## 2019-03-04 DIAGNOSIS — Z01419 Encounter for gynecological examination (general) (routine) without abnormal findings: Secondary | ICD-10-CM

## 2019-03-04 NOTE — Addendum Note (Signed)
Addended by: Davis Gourd on: 03/04/2019 01:52 PM   Modules accepted: Orders

## 2019-03-04 NOTE — Telephone Encounter (Signed)
[place] for referral?   Copied from [place] [phone] [id number]. Topic: Referral - Request for Referral >> Mar 04, 2019 11:31 AM Lennox Solders wrote: Has patient seen PCP for this complaint? No  pt saw dr Birdie Riddle on 01-29-2019 for physical. Pt sees  dr Larina Earthly for her pap and breast exam. Pt had an appt with dr Valentino Saxon on 02-23-2019. 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 Pt saw dr Valentino Saxon last year but had a different insurance/

## 2019-03-04 NOTE — Telephone Encounter (Signed)
Referral placed.

## 2019-03-04 NOTE — Telephone Encounter (Signed)
Ok for referral?

## 2019-03-11 ENCOUNTER — Other Ambulatory Visit: Payer: Self-pay | Admitting: Family Medicine

## 2019-03-11 NOTE — Telephone Encounter (Signed)
Last Filled: 02/03/2019 #30, 0 Last OV: 01/29/2019

## 2019-04-01 DIAGNOSIS — K912 Postsurgical malabsorption, not elsewhere classified: Secondary | ICD-10-CM | POA: Diagnosis not present

## 2019-04-01 DIAGNOSIS — Z903 Acquired absence of stomach [part of]: Secondary | ICD-10-CM | POA: Diagnosis not present

## 2019-04-02 ENCOUNTER — Other Ambulatory Visit: Payer: Self-pay | Admitting: Family Medicine

## 2019-04-11 ENCOUNTER — Other Ambulatory Visit: Payer: Self-pay | Admitting: Family Medicine

## 2019-04-26 ENCOUNTER — Encounter: Payer: Self-pay | Admitting: Gastroenterology

## 2019-05-10 ENCOUNTER — Other Ambulatory Visit: Payer: Self-pay | Admitting: Family Medicine

## 2019-05-18 ENCOUNTER — Telehealth: Payer: Self-pay | Admitting: *Deleted

## 2019-05-18 NOTE — Telephone Encounter (Signed)
Due to current COVID 19 pandemic, our office is severely reducing in office visits until further notice, in order to minimize the risk to our patients and healthcare providers.  Pt understands that although there may be some limitations with this type of visit, we will take all precautions to reduce any security or privacy concerns.  Pt understands that this will be treated like an in office visit and we will file with pt's insurance, and there may be a patient responsible charge related to this service.  Consented to [email or web address] visit.  Email sent to [email or web address].

## 2019-05-19 ENCOUNTER — Encounter: Payer: Self-pay | Admitting: Acute Care

## 2019-05-24 ENCOUNTER — Encounter: Payer: Self-pay | Admitting: Adult Health

## 2019-05-24 ENCOUNTER — Ambulatory Visit (INDEPENDENT_AMBULATORY_CARE_PROVIDER_SITE_OTHER): Payer: Medicare Other | Admitting: Adult Health

## 2019-05-24 ENCOUNTER — Other Ambulatory Visit: Payer: Self-pay

## 2019-05-24 DIAGNOSIS — Z9989 Dependence on other enabling machines and devices: Secondary | ICD-10-CM | POA: Diagnosis not present

## 2019-05-24 DIAGNOSIS — R634 Abnormal weight loss: Secondary | ICD-10-CM | POA: Diagnosis not present

## 2019-05-24 DIAGNOSIS — G4733 Obstructive sleep apnea (adult) (pediatric): Secondary | ICD-10-CM

## 2019-05-24 NOTE — Progress Notes (Signed)
PATIENT: Linda Buckley DOB: 1956-05-22  REASON FOR VISIT: follow up HISTORY FROM: patient  Virtual Visit via Video Note  I connected with [name] on 05/24/19 at  1:30 PM EDT by a video enabled telemedicine application located remotely at [facility] and verified that I am speaking with the correct person using two identifiers who was located at their own home.   I discussed the limitations of evaluation and management by telemedicine and the availability of in person appointments. The patient expressed understanding and agreed to proceed.   PATIENT: Linda Buckley DOB: [birth year]  REASON FOR VISIT: follow up HISTORY FROM: patient  HISTORY OF PRESENT ILLNESS: Today 05/24/19:  Ms. Pfost is a 63 year old female with a history of OSA on CPAP. She joins me today for virtual visit. She reports that she had weight loss surgery and has lost approximately 73 pounds.  She states that she weighs 268 pounds prior to the surgery she currently weighs 195.  She states that she has not been using the CPAP consistently.  She states that she is curious if she even needs it with the weight loss.  Her download indicates that she use her machine 15 out of 30 days for compliance of 50%.  She use her machine greater than 4 hours 10 days for compliance of 33%.  On average she uses her machine 5 hours.  Her residual AHI is 1.6 on 5 to 12 cm of water with an EPR of 3.  She does have a leak in the 95th percentile at 31.9 L/min.  She joins me today for virtual visit.  HISTORY 09/07/18:  Ms. Sturgeon is a 63 year old female with a history of fibromyalgia and obstructive sleep apnea on CPAP.  She returns today for follow-up.  At the last visit Lyrica was increased.  She states that this is working well for her fibromyalgia but is unsure if it caused her more sleepiness.  The patient is also on a CPAP machine.  Her download indicates that she use her machine 42 out of 60 days  for compliance of 70%.  She used her machine greater than 4 hours 39 days for compliance of 65%.  On average she uses her machine 7 hours and 15 minutes.  Her residual AHI is 6.1 on 5 to 12 cm of water with EPR of 3.  Her leak in the 95th percentile is 42.9 L/min.  She returns today for follow-up.  REVIEW OF SYSTEMS: Out of a complete 14 system review of symptoms, the patient complains only of the following symptoms, and all other reviewed systems are negative.  See HPI  ALLERGIES: Allergies  Allergen Reactions  . Aspirin Swelling    Sweating/ swelling of hands and face   . Bee Venom Swelling    Swelling at site     HOME MEDICATIONS: Outpatient Medications Prior to Visit  Medication Sig Dispense Refill  . buPROPion (WELLBUTRIN XL) 150 MG 24 hr tablet Take 1 tablet (150 mg total) by mouth daily. 90 tablet 1  . buPROPion (WELLBUTRIN XL) 300 MG 24 hr tablet Take 1 tablet (300 mg total) by mouth every morning. 90 tablet 1  . cetirizine (ZYRTEC) 10 MG tablet TAKE 1 TABLET(10 MG) BY MOUTH DAILY 30 tablet 0  . Cholecalciferol (VITAMIN D PO) Take 1 tablet by mouth daily. 1000units    . clonazePAM (KLONOPIN) 0.5 MG tablet TAKE 1 TABLET(0.5 MG) BY MOUTH AT BEDTIME 30 tablet 3  .  DULoxetine (CYMBALTA) 60 MG capsule TAKE ONE CAPSULE BY MOUTH TWICE DAILY 180 capsule 2  . Multiple Vitamin (MULTIVITAMIN) capsule Take 1 capsule by mouth daily. With iron    . pregabalin (LYRICA) 100 MG capsule One capsule in the morning and 2 in the evening 270 capsule 1  . QVAR REDIHALER 80 MCG/ACT inhaler INHALE 1 PUFF BY MOUTH TWICE DAILY 10.6 g 0  . traZODone (DESYREL) 50 MG tablet TAKE 1/2 TO 1 TABLET(25 TO 50 MG) BY MOUTH AT BEDTIME AS NEEDED FOR SLEEP 30 tablet 0  . VENTOLIN HFA 108 (90 Base) MCG/ACT inhaler INHALE 2 PUFFS BY MOUTH EVERY 4 HOURS AS NEEDED FOR SHORTNESS OF BREATH 36 g 0  . vitamin E 1000 UNIT capsule Take 2,000 Units by mouth.      No facility-administered medications prior to visit.     PAST  MEDICAL HISTORY: Past Medical History:  Diagnosis Date  . Allergy   . Anxiety   . Arthritis   . Carpal tunnel syndrome of right wrist    RECURRENT  . Depression   . Dysphagia 09/02/2017  . Edema 09/02/2017  . Environmental allergies    dust mites, pollen, roaches and other insects, mold  . Fibromyalgia 04/2016  . Headache    migraines  . Hereditary and idiopathic peripheral neuropathy 04/03/2016  . History of adenomatous polyp of colon   . History of gastric ulcer   . History of kidney stones   . Hypertension   . Pneumonia   . Sleep apnea    wears CPAP  . Wears glasses     PAST SURGICAL HISTORY: Past Surgical History:  Procedure Laterality Date  . CARPAL TUNNEL RELEASE Right 2007  . CARPAL TUNNEL RELEASE Right 07/05/2015   Procedure: RIGHT HAND REVISION CARPAL TUNNEL RELEASE;  Surgeon: Iran Planas, MD;  Location: [place];  Service: Orthopedics;  Laterality: Right;  . CARPAL TUNNEL RELEASE Left 09/2016  . CERVICAL DISC ARTHROPLASTY  03/2015  . COLONOSCOPY W/ POLYPECTOMY  04-15-2014  . CYSTO/  RIGHT RETROGRADE PYELOGRAM/ URETEROSCOPY STONE EXTRACTION/  STENT PLACEMENT  06-07-2010  . FINGER ARTHRODESIS Right 08/10/2018   Procedure: RIGHT THUMB ARTHRODESIS;  Surgeon: Milly Jakob, MD;  Location: [place];  Service: Orthopedics;  Laterality: Right;  . LAPAROSCOPIC ASSISTED VAGINAL HYSTERECTOMY  04-25-2004   ovaries remain  . LAPAROSCOPIC GASTRIC BAND REMOVAL WITH LAPAROSCOPIC GASTRIC SLEEVE RESECTION  10/26/2018  . TUBAL LIGATION  1982    FAMILY HISTORY: Family History  Problem Relation Age of Onset  . Asthma Father   . Diabetes Father   . Arthritis Mother   . Hypertension Mother   . Diabetes Mother   . Cancer Mother   . Stomach cancer Mother   . Diabetes Brother   . Diabetes Brother   . Asthma Son   . Alcohol abuse Son   . Anxiety disorder Son   . Schizophrenia Son   . Cancer Maternal Grandfather        colon  . Colon cancer Maternal Grandfather    . Cancer Maternal Aunt        breast  . Cancer Maternal Uncle        lung  . Colon cancer Maternal Uncle   . Cancer Maternal Grandmother        ovarian  . Diabetes Son     SOCIAL HISTORY: Social History   Socioeconomic History  . Marital status: Married    Spouse name: Not on file  . Number of  children: 2  . Years of education: 2 yrs college  . Highest education level: Not on file  Occupational History  . 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 Financial resource strain: Very hard  . Food insecurity:    Worry: Often true    Inability: Often true  . Transportation needs:    Medical: No    Non-medical: No  Tobacco Use  . Smoking status: Never Smoker  . Smokeless tobacco: Never Used  Substance and Sexual Activity  . Alcohol use: Not Currently    Comment: 2 glasses of wine weekly - reports none in 2 years  . Drug use: No    Comment: prescription for klonopin and oxy IR  . Sexual activity: Yes    Partners: Male    Birth control/protection: None    Comment: partial hysterectomy  Lifestyle  . Physical activity:    Days per week: 0 days    Minutes per session: 0 min  . Stress: Rather much  Relationships  . Social connections:    Talks on phone: More than three times a week    Gets together: Once a week    Attends religious service: More than 4 times per year    Active member of club or organization: Yes    Attends meetings of clubs or organizations: 1 to 4 times per year    Relationship status: Married  . Intimate partner violence:    Fear of current or ex partner: No    Emotionally abused: No    Physically abused: No    Forced sexual activity: No  Other Topics Concern  . Not on file  Social History Narrative   Lives at home w/ her husband   Right-handed   Drinks 2 cups of coffee weekly    3 bottle water per day      PHYSICAL EXAM Generalized: Well developed, in no acute distress   Neurological examination  Mentation:  Alert oriented to time, place, history taking. Follows all commands speech and language fluent Cranial nerve II-XII:Extraocular movements were full. Facial symmetry noted. uvula tongue midline. Head turning and shoulder shrug  were normal and symmetric. Motor: Good strength throughout subjectively per patient Sensory: Sensory testing is intact to soft touch on all 4 extremities subjectively per patient Coordination: Cerebellar testing reveals good finger-nose-finger  Reflexes: UTA  DIAGNOSTIC DATA (LABS, IMAGING, TESTING) - I reviewed patient records, labs, notes, testing and imaging myself where available.  Lab Results  Component Value Date   WBC 2.9 (L) 01/29/2019   HGB 12.6 01/29/2019   HCT 38.0 01/29/2019   MCV 89.3 01/29/2019   PLT 225.0 01/29/2019      Component Value Date/Time   NA 144 01/29/2019 1138   K 3.6 01/29/2019 1138   CL 108 01/29/2019 1138   CO2 29 01/29/2019 1138   GLUCOSE 85 01/29/2019 1138   BUN 17 01/29/2019 1138   CREATININE 0.82 01/29/2019 1138   CREATININE 0.78 04/22/2016 1454   CALCIUM 9.5 01/29/2019 1138   PROT 6.2 01/29/2019 1138   PROT 6.6 04/03/2016 1018   ALBUMIN 3.9 01/29/2019 1138   AST 13 01/29/2019 1138   ALT 10 01/29/2019 1138   ALKPHOS 67 01/29/2019 1138   BILITOT 0.5 01/29/2019 1138   GFRNONAA 54 (L) 08/04/2018 1230   GFRAA >60 08/04/2018 1230   Lab Results  Component Value Date   CHOL 163 01/29/2019   HDL 49.10 01/29/2019   LDLCALC 100 (H)  01/29/2019   TRIG 69.0 01/29/2019   CHOLHDL 3 01/29/2019   Lab Results  Component Value Date   HGBA1C 6.0 11/05/2017   Lab Results  Component Value Date   NLGXQJJH41 740 02/01/2019   Lab Results  Component Value Date   TSH 0.60 01/29/2019      ASSESSMENT AND PLAN 63 y.o. year old female  has a past medical history of Allergy, Anxiety, Arthritis, Carpal tunnel syndrome of right wrist, Depression, Dysphagia (09/02/2017), Edema (09/02/2017), Environmental allergies, Fibromyalgia  (04/2016), Headache, Hereditary and idiopathic peripheral neuropathy (04/03/2016), History of adenomatous polyp of colon, History of gastric ulcer, History of kidney stones, Hypertension, Pneumonia, Sleep apnea, and Wears glasses. here with:  1.  Obstructive sleep apnea on CPAP  The patient CPAP download shows suboptimal compliance.  She has had a 73 pound weight loss.  I will repeat a home sleep test to see if apnea is still present.  Patient voiced understanding.  She will follow-up up after the sleep test.   I spent 15 minutes with the patient this time was spent reviewing her sleep download discussing weight loss and home sleep test.   Ward Givens, MSN, NP-C 05/24/2019, 1:36 PM [facility] [address], [place], [place] [address] [phone]

## 2019-05-25 ENCOUNTER — Other Ambulatory Visit: Payer: Self-pay | Admitting: Neurology

## 2019-05-25 ENCOUNTER — Telehealth: Payer: Self-pay | Admitting: Family Medicine

## 2019-05-25 DIAGNOSIS — G4733 Obstructive sleep apnea (adult) (pediatric): Secondary | ICD-10-CM

## 2019-05-25 NOTE — Telephone Encounter (Signed)
Pt called in stating that she is having some shoulder pain that radiates down to her elbow. At times she has a difficult time lifting her arm up. Pt is aware that Birdie Riddle is not in the office. She can be reached at the home #   She wanted to know what Tabori recommended she do about this.

## 2019-05-26 ENCOUNTER — Ambulatory Visit (INDEPENDENT_AMBULATORY_CARE_PROVIDER_SITE_OTHER): Payer: Medicare Other | Admitting: Family Medicine

## 2019-05-26 ENCOUNTER — Other Ambulatory Visit: Payer: Self-pay

## 2019-05-26 ENCOUNTER — Encounter: Payer: Self-pay | Admitting: Family Medicine

## 2019-05-26 DIAGNOSIS — M25512 Pain in left shoulder: Secondary | ICD-10-CM | POA: Diagnosis not present

## 2019-05-26 MED ORDER — NAPROXEN 500 MG PO TABS: 500.0000 mg | ORAL_TABLET | Freq: Two times a day (BID) | ORAL | 0 refills | Status: DC

## 2019-05-26 NOTE — Progress Notes (Signed)
Virtual Visit via Video   I connected with patient on 05/26/19 at  9:20 AM EDT by a video enabled telemedicine application and verified that I am speaking with the correct person using two identifiers.  Location patient: Home Location provider: Acupuncturist, Office Persons participating in the virtual visit: Patient, Provider, [place] (Jess B)  I discussed the limitations of evaluation and management by telemedicine and the availability of in person appointments. The patient expressed understanding and agreed to proceed.  Subjective:   HPI:  L shoulder- pt has pain starting in [facility] joint radiating down to elbow.  Pain is over biceps.  Painful to lift arm overhead, reach across, internal rotation- but less than reaching across or overhead.  Painful to abduct.  sxs started ~3 months ago.  Having daily pain.  Has compression sleeve but not helpful.  Has not taken OTC pain medications.  ROS:   See pertinent positives and negatives per HPI.  Patient Active Problem List   Diagnosis Date Noted  . MDD (major depressive disorder), recurrent episode, severe ([place]) 05/05/2018  . Vitamin D deficiency 11/05/2017  . Dysphagia 09/02/2017  . OSA (obstructive sleep apnea) 02/14/2017  . Multiple falls 09/04/2016  . Hereditary and idiopathic peripheral neuropathy 04/03/2016  . Exertional shortness of breath 01/26/2016  . Left leg pain 01/26/2016  . Fibromyalgia 07/28/2015  . Right wrist pain 04/05/2015  . Physical exam 03/01/2014  . HTN (hypertension) 01/12/2014  . Pre-diabetes 01/12/2014  . Family history of lupus erythematosus 01/12/2014  . Fatigue 10/17/2013    Social History   Tobacco Use  . Smoking status: Never Smoker  . Smokeless tobacco: Never Used  Substance Use Topics  . Alcohol use: Not Currently    Comment: 2 glasses of wine weekly - reports none in 2 years    Current Outpatient Medications:  .  buPROPion (WELLBUTRIN XL) 150 MG 24 hr tablet, Take 1 tablet (150 mg total)  by mouth daily., Disp: 90 tablet, Rfl: 1 .  buPROPion (WELLBUTRIN XL) 300 MG 24 hr tablet, Take 1 tablet (300 mg total) by mouth every morning., Disp: 90 tablet, Rfl: 1 .  cetirizine (ZYRTEC) 10 MG tablet, TAKE 1 TABLET(10 MG) BY MOUTH DAILY, Disp: 30 tablet, Rfl: 0 .  Cholecalciferol (VITAMIN D PO), Take 1 tablet by mouth daily. 1000units, Disp: , Rfl:  .  clonazePAM (KLONOPIN) 0.5 MG tablet, TAKE 1 TABLET(0.5 MG) BY MOUTH AT BEDTIME, Disp: 30 tablet, Rfl: 3 .  DULoxetine (CYMBALTA) 60 MG capsule, TAKE ONE CAPSULE BY MOUTH TWICE DAILY, Disp: 180 capsule, Rfl: 2 .  Multiple Vitamin (MULTIVITAMIN) capsule, Take 1 capsule by mouth daily. With iron, Disp: , Rfl:  .  pregabalin (LYRICA) 100 MG capsule, One capsule in the morning and 2 in the evening, Disp: 270 capsule, Rfl: 1 .  QVAR REDIHALER 80 MCG/ACT inhaler, INHALE 1 PUFF BY MOUTH TWICE DAILY, Disp: 10.6 g, Rfl: 0 .  traZODone (DESYREL) 50 MG tablet, TAKE 1/2 TO 1 TABLET(25 TO 50 MG) BY MOUTH AT BEDTIME AS NEEDED FOR SLEEP, Disp: 30 tablet, Rfl: 0 .  VENTOLIN HFA 108 (90 Base) MCG/ACT inhaler, INHALE 2 PUFFS BY MOUTH EVERY 4 HOURS AS NEEDED FOR SHORTNESS OF BREATH, Disp: 36 g, Rfl: 0 .  vitamin E 1000 UNIT capsule, Take 2,000 Units by mouth. , Disp: , Rfl:  .  ursodiol (ACTIGALL) 300 MG capsule, TK 1 C PO BID WF BEGINNING 14 DAYS AFTER SURGERY, Disp: , Rfl:   Allergies  Allergen Reactions  .  Aspirin Swelling    Sweating/ swelling of hands and face   . Bee Venom Swelling    Swelling at site     Objective:   There were no vitals taken for this visit.  AAOx3, NAD NCAT, EOMI No obvious CN deficits Coloring WNL Pt is able to speak clearly, coherently without shortness of breath or increased work of breathing.  Thought process is linear.  Mood is appropriate.  + impingement signs on L, pain w/ abduction, forward flexion >90, and internal rotation  Assessment and Plan:   L shoulder pain- new to provider, ongoing for pt.  She has not  tried any OTC products for pain relief.  She has tolerated Naproxen in the past- despite an ASA allergy.  Will start this and refer to Ortho for complete evaluation as I suspect she may need an injxn.  Pt expressed understanding and is in agreement w/ plan.    Annye Asa, MD 05/26/2019

## 2019-05-26 NOTE — Telephone Encounter (Signed)
Pt is scheduled °

## 2019-05-26 NOTE — Telephone Encounter (Signed)
Pt would need an appointment for evaluation. Can be a VV.

## 2019-05-26 NOTE — Progress Notes (Signed)
I have discussed the procedure for the virtual visit with the patient who has given consent to proceed with assessment and treatment.   Jessica L Brodmerkel, CMA     

## 2019-05-28 ENCOUNTER — Other Ambulatory Visit: Payer: Self-pay

## 2019-05-28 ENCOUNTER — Ambulatory Visit (INDEPENDENT_AMBULATORY_CARE_PROVIDER_SITE_OTHER): Payer: Medicare Other | Admitting: Orthopaedic Surgery

## 2019-05-28 ENCOUNTER — Ambulatory Visit: Payer: Medicare Other

## 2019-05-28 DIAGNOSIS — M25512 Pain in left shoulder: Secondary | ICD-10-CM | POA: Diagnosis not present

## 2019-05-28 DIAGNOSIS — G8929 Other chronic pain: Secondary | ICD-10-CM

## 2019-05-28 MED ORDER — METHYLPREDNISOLONE ACETATE 40 MG/ML IJ SUSP
40.0000 mg | INTRAMUSCULAR | Status: AC | PRN
  Administered 2019-05-28: 40 mg via INTRA_ARTICULAR

## 2019-05-28 MED ORDER — BUPIVACAINE HCL 0.5 % IJ SOLN
3.0000 mL | INTRAMUSCULAR | Status: AC | PRN
  Administered 2019-05-28: 3 mL via INTRA_ARTICULAR

## 2019-05-28 MED ORDER — LIDOCAINE HCL 1 % IJ SOLN
3.0000 mL | INTRAMUSCULAR | Status: AC | PRN
  Administered 2019-05-28: 3 mL

## 2019-05-28 NOTE — Progress Notes (Signed)
Office Visit Note   Patient: Linda Buckley           Date of Birth: Jan 06, 1956           MRN: [id number] Visit Date: 05/28/2019              Requested by: Midge Minium, MD [name] [place],  [place] [address] PCP: Midge Minium, MD   Assessment & Plan: Visit Diagnoses:  1. Chronic left shoulder pain     Plan: Impression is chronic left shoulder pain suspect rotator cuff tendinosis.  Rotator cuff function is intact.  Patient does have fibromyalgia also.  We agreed to start with a subacromial injection as well as 6 weeks of physical therapy to see if this will improve the pain.  Patient tolerated the injection well today.  We will see her back as needed.  Follow-Up Instructions: Return if symptoms worsen or fail to improve.   Orders:  Orders Placed This Encounter  Procedures  . XR Shoulder Left   No orders of the defined types were placed in this encounter.     Procedures: Large Joint Inj: L subacromial bursa on 05/28/2019 2:55 PM Indications: pain Details: 22 G needle  Arthrogram: No  Medications: 3 mL lidocaine 1 %; 3 mL bupivacaine 0.5 %; 40 mg methylPREDNISolone acetate 40 MG/ML Outcome: tolerated well, no immediate complications Patient was prepped and draped in the usual sterile fashion.       Clinical Data: No additional findings.   Subjective: Chief Complaint  Patient presents with  . Left Shoulder - Pain    [name] is a 63 year old female comes in for evaluation of her left shoulder pain for 3 months.  She states that she can no longer sleep well due to the pain and she is very frustrated.  She states that she is developed decreased range of motion and decreased strength as a result.  She denies any changes in recent activities.  Denies any injuries.  Denies any numbness and tingling or radicular symptoms.   Review of Systems  Constitutional: Negative.   HENT: Negative.   Eyes: Negative.   Respiratory: Negative.    Cardiovascular: Negative.   Endocrine: Negative.   Musculoskeletal: Negative.   Neurological: Negative.   Hematological: Negative.   Psychiatric/Behavioral: Negative.   All other systems reviewed and are negative.    Objective: Vital Signs: There were no vitals taken for this visit.  Physical Exam Vitals signs and nursing note reviewed.  Constitutional:      Appearance: She is well-developed.  HENT:     Head: Normocephalic and atraumatic.  Neck:     Musculoskeletal: Neck supple.  Pulmonary:     Effort: Pulmonary effort is normal.  Abdominal:     Palpations: Abdomen is soft.  Skin:    General: Skin is warm.     Capillary Refill: Capillary refill takes less than 2 seconds.  Neurological:     Mental Status: She is alert and oriented to person, place, and time.  Psychiatric:        Behavior: Behavior normal.        Thought Content: Thought content normal.        Judgment: Judgment normal.     Ortho Exam Left shoulder exam shows grossly intact rotator cuff function but with pain with testing of supraspinatus and infraspinatus.  Mildly positive impingement sign. Specialty Comments:  No specialty comments available.  Imaging: Xr Shoulder Left  Result Date:  05/28/2019 Chronic appearing os acromiale.  No acute abnormalities.    PMFS History: Patient Active Problem List   Diagnosis Date Noted  . MDD (major depressive disorder), recurrent episode, severe ([place]) 05/05/2018  . Vitamin D deficiency 11/05/2017  . Dysphagia 09/02/2017  . OSA (obstructive sleep apnea) 02/14/2017  . Multiple falls 09/04/2016  . Hereditary and idiopathic peripheral neuropathy 04/03/2016  . Exertional shortness of breath 01/26/2016  . Left leg pain 01/26/2016  . Fibromyalgia 07/28/2015  . Right wrist pain 04/05/2015  . Physical exam 03/01/2014  . HTN (hypertension) 01/12/2014  . Pre-diabetes 01/12/2014  . Family history of lupus erythematosus 01/12/2014  . Fatigue 10/17/2013   Past  Medical History:  Diagnosis Date  . Allergy   . Anxiety   . Arthritis   . Carpal tunnel syndrome of right wrist    RECURRENT  . Depression   . Dysphagia 09/02/2017  . Edema 09/02/2017  . Environmental allergies    dust mites, pollen, roaches and other insects, mold  . Fibromyalgia 04/2016  . Headache    migraines  . Hereditary and idiopathic peripheral neuropathy 04/03/2016  . History of adenomatous polyp of colon   . History of gastric ulcer   . History of kidney stones   . Hypertension   . Pneumonia   . Sleep apnea    wears CPAP  . Wears glasses     Family History  Problem Relation Age of Onset  . Asthma Father   . Diabetes Father   . Arthritis Mother   . Hypertension Mother   . Diabetes Mother   . Cancer Mother   . Stomach cancer Mother   . Diabetes Brother   . Diabetes Brother   . Asthma Son   . Alcohol abuse Son   . Anxiety disorder Son   . Schizophrenia Son   . Cancer Maternal Grandfather        colon  . Colon cancer Maternal Grandfather   . Cancer Maternal Aunt        breast  . Cancer Maternal Uncle        lung  . Colon cancer Maternal Uncle   . Cancer Maternal Grandmother        ovarian  . Diabetes Son     Past Surgical History:  Procedure Laterality Date  . CARPAL TUNNEL RELEASE Right 2007  . CARPAL TUNNEL RELEASE Right 07/05/2015   Procedure: RIGHT HAND REVISION CARPAL TUNNEL RELEASE;  Surgeon: Iran Planas, MD;  Location: [facility];  Service: Orthopedics;  Laterality: Right;  . CARPAL TUNNEL RELEASE Left 09/2016  . CERVICAL DISC ARTHROPLASTY  03/2015  . COLONOSCOPY W/ POLYPECTOMY  04-15-2014  . CYSTO/  RIGHT RETROGRADE PYELOGRAM/ URETEROSCOPY STONE EXTRACTION/  STENT PLACEMENT  06-07-2010  . FINGER ARTHRODESIS Right 08/10/2018   Procedure: RIGHT THUMB ARTHRODESIS;  Surgeon: Milly Jakob, MD;  Location: [facility];  Service: Orthopedics;  Laterality: Right;  . LAPAROSCOPIC ASSISTED VAGINAL HYSTERECTOMY  04-25-2004   ovaries remain  .  LAPAROSCOPIC GASTRIC BAND REMOVAL WITH LAPAROSCOPIC GASTRIC SLEEVE RESECTION  10/26/2018  . TUBAL LIGATION  1982   Social History   Occupational History  . 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 Smoking status: Never Smoker  . Smokeless tobacco: Never Used  Substance and Sexual Activity  . Alcohol use: Not Currently    Comment: 2 glasses of wine weekly - reports none in 2 years  . Drug use: No  Comment: prescription for klonopin and oxy IR  . Sexual activity: Yes    Partners: Male    Birth control/protection: None    Comment: partial hysterectomy

## 2019-06-07 IMAGING — MR MR HEAD W/O CM
8 of 10 series · 36 of 48 positions shown · non-contrast
Comparison: None.

CLINICAL DATA: Ataxia. Suspect stroke. History of hypertension,
migraines.

EXAM:
MRI HEAD WITHOUT CONTRAST
TECHNIQUE: Multiplanar, multiecho pulse sequences of the brain and surrounding
structures were obtained without intravenous contrast.

[Series 3: DWI · axial · 3.0mm · 1.09mm/px · z∈[-23,+121]mm · 8 of 98 slices shown (1 of 4)]
[im 1/98]
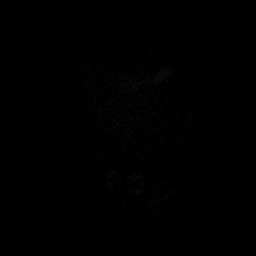
[im 11/98]
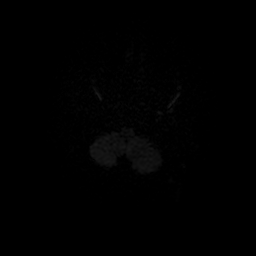
[im 33/98]
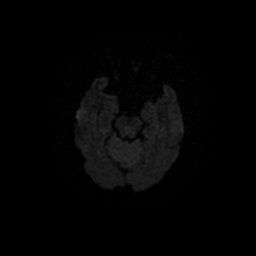
[im 44/98]
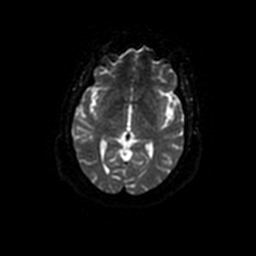
[im 54/98]
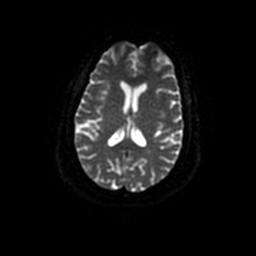
[im 65/98]
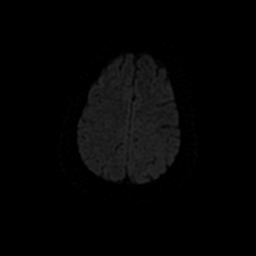
[im 87/98]
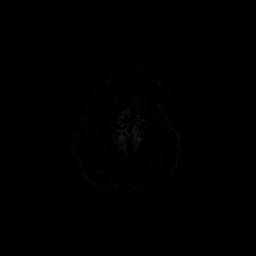
[im 98/98]
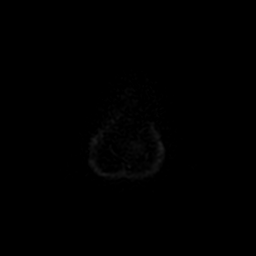

[Series 4: T1 · sagittal · 5.0mm · 0.47mm/px · 2 of 25 slices shown]
[im 1/25]
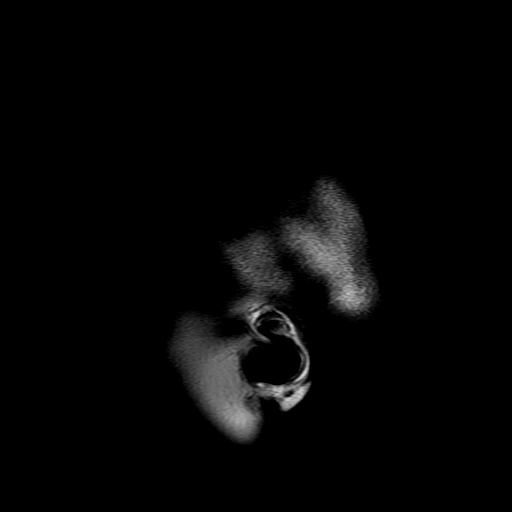
[im 13/25]
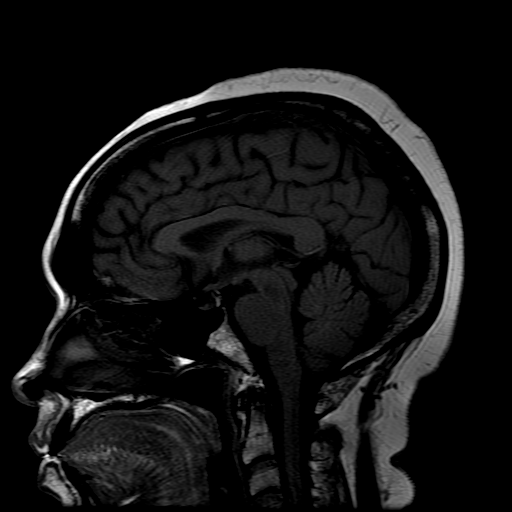

[Series 5: DWI · coronal · 5.0mm · 1.09mm/px · 8 of 74 slices shown (2 of 4)]
[im 1/74]
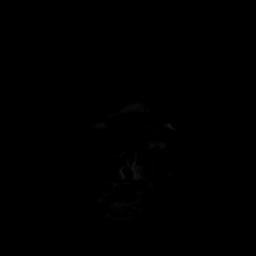
[im 11/74]
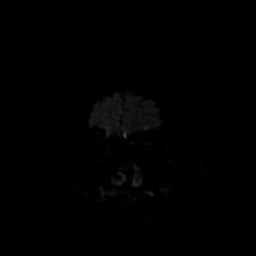
[im 21/74]
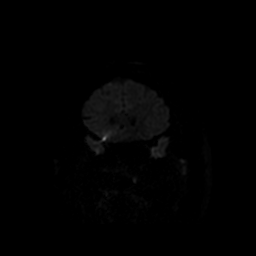
[im 32/74]
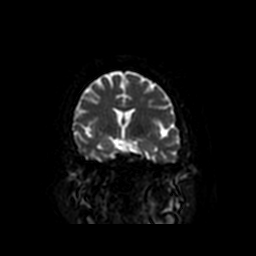
[im 42/74]
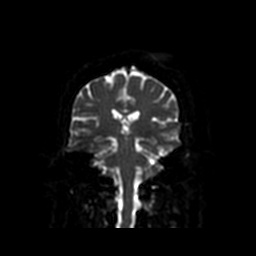
[im 53/74]
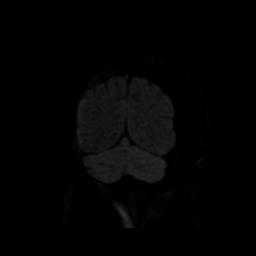
[im 63/74]
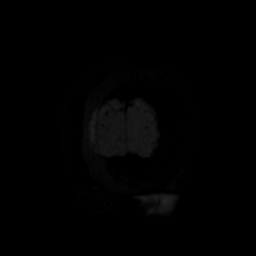
[im 74/74]
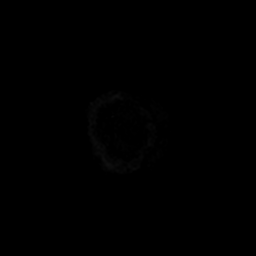

[Series 6: T2 · axial · 5.0mm · 0.43mm/px · z∈[-25,+129]mm · 3 of 23 slices shown (1 of 2)]
[im 1/23]
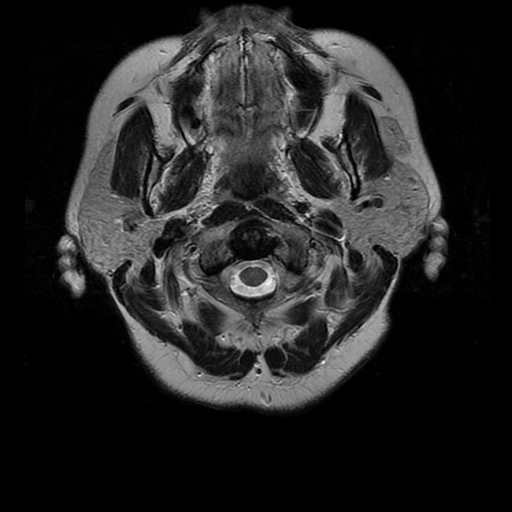
[im 12/23]
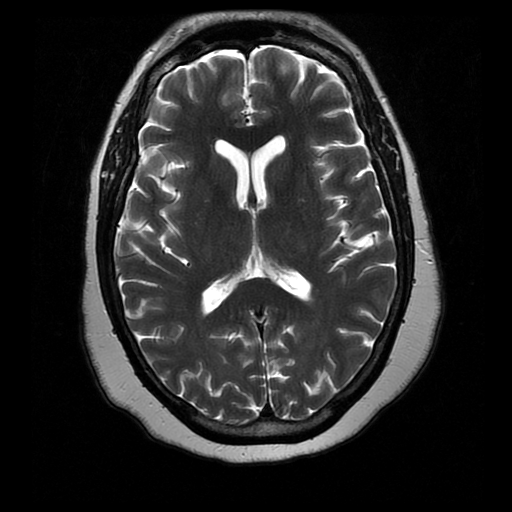
[im 23/23]
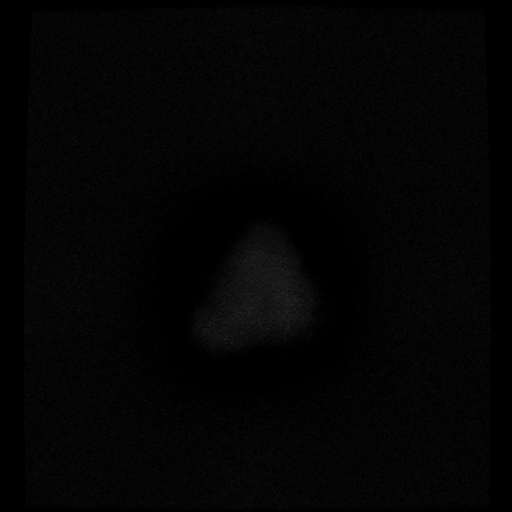

[Series 7: FLAIR · axial · 3.0mm · 0.43mm/px · z∈[-23,+121]mm · 3 of 25 slices shown]
[im 1/25]
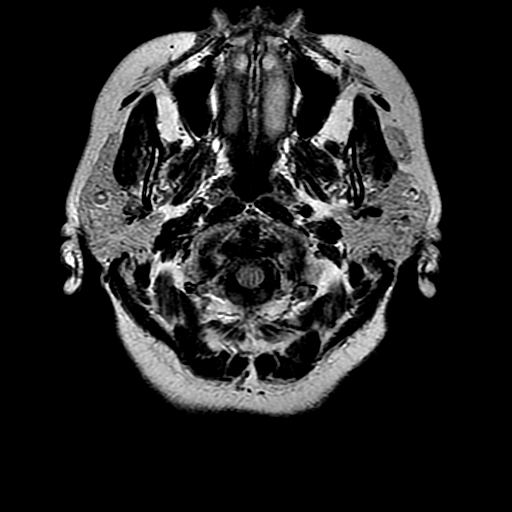
[im 13/25]
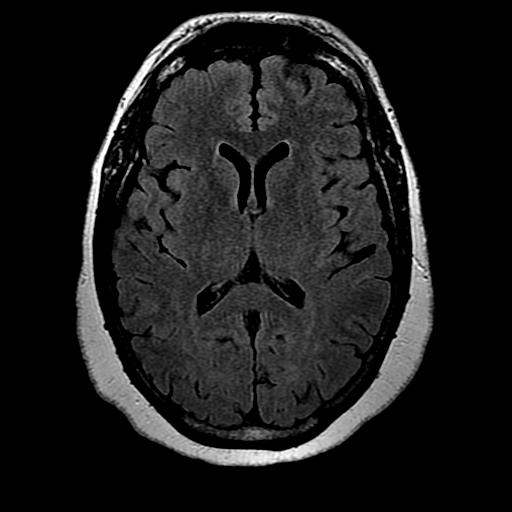
[im 25/25]
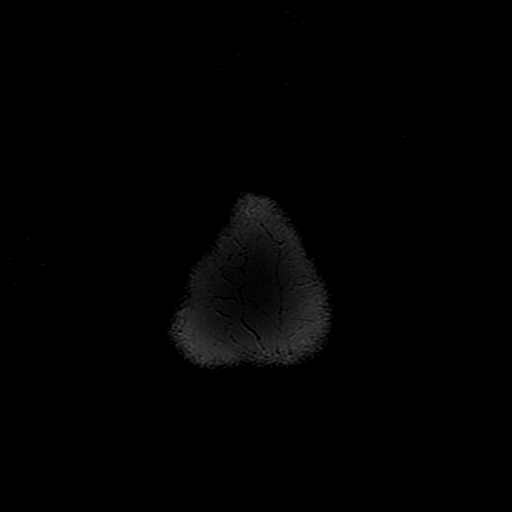

[Series 10: T2 · coronal · 5.0mm · 0.45mm/px · 3 of 29 slices shown (2 of 2)]
[im 1/29]
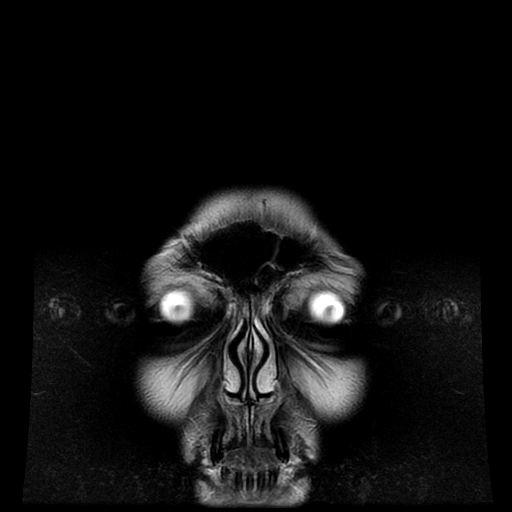
[im 15/29]
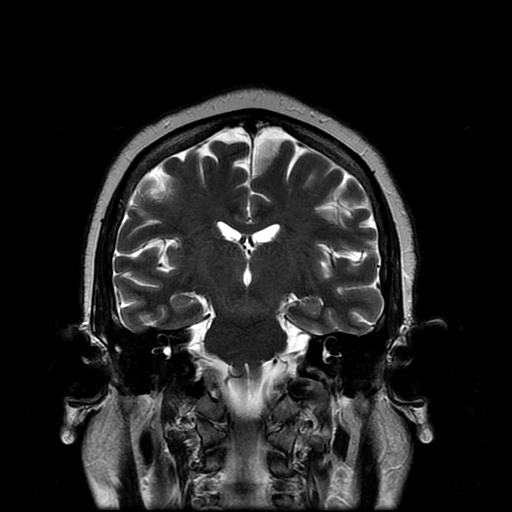
[im 29/29]
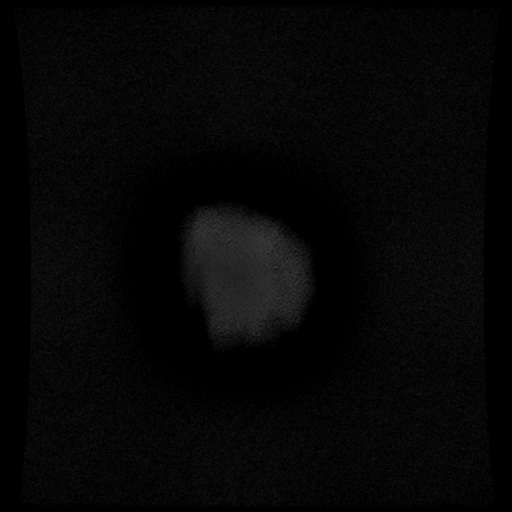

[Series 300: DWI · axial · 3.0mm · 1.09mm/px · z∈[-23,+121]mm · 5 of 49 slices shown (3 of 4)]
[im 1/49]
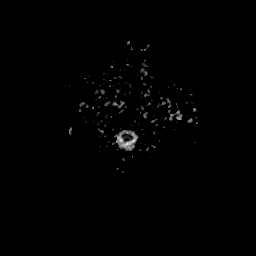
[im 13/49]
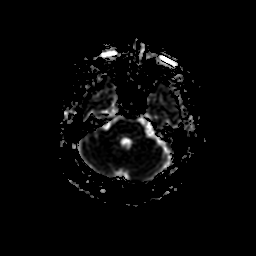
[im 25/49]
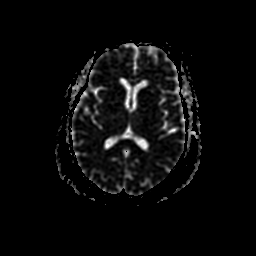
[im 37/49]
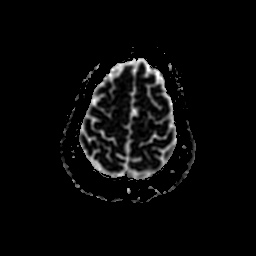
[im 49/49]
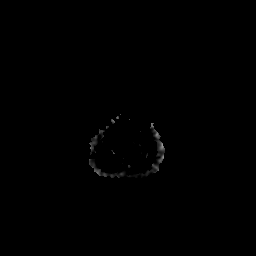

[Series 500: DWI · coronal · 5.0mm · 1.09mm/px · 4 of 37 slices shown (4 of 4)]
[im 1/37]
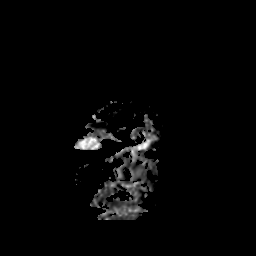
[im 13/37]
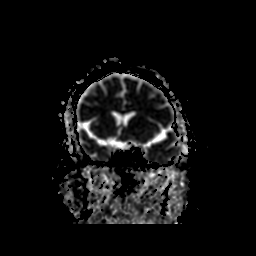
[im 25/37]
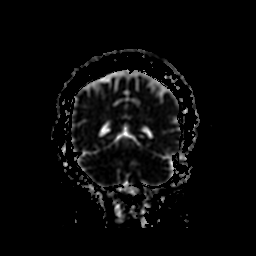
[im 37/37]
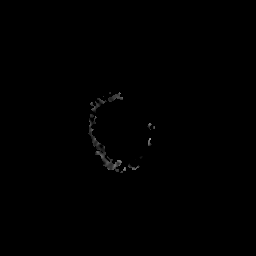

[36 of 48 positions shown; findings below may reference images not displayed]

FINDINGS: BRAIN: No reduced diffusion to suggest acute ischemia. No
susceptibility artifact to suggest hemorrhage. The ventricles and
sulci are normal for patient's age. A few scattered nonspecific
supratentorial white matter FLAIR T2 hyperintensities, normal for
age. No suspicious parenchymal signal, mass or mass effect. No
abnormal extra-axial fluid collections.

VASCULAR: Normal major intracranial vascular flow voids present at
skull base.

SKULL AND UPPER CERVICAL SPINE: No abnormal sellar expansion. No
suspicious calvarial bone marrow signal. Craniocervical junction
maintained.

SINUSES/ORBITS: The mastoid air-cells and included paranasal sinuses
are well-aerated. The included ocular globes and orbital contents
are non-suspicious.

OTHER: None.
IMPRESSION: Negative noncontrast MRI of the head for age.

## 2019-06-08 ENCOUNTER — Other Ambulatory Visit: Payer: Self-pay | Admitting: Family Medicine

## 2019-06-10 DIAGNOSIS — G4733 Obstructive sleep apnea (adult) (pediatric): Secondary | ICD-10-CM | POA: Diagnosis not present

## 2019-06-16 ENCOUNTER — Other Ambulatory Visit: Payer: Self-pay

## 2019-06-16 ENCOUNTER — Ambulatory Visit (INDEPENDENT_AMBULATORY_CARE_PROVIDER_SITE_OTHER): Payer: Medicare Other | Admitting: Neurology

## 2019-06-16 DIAGNOSIS — Z9989 Dependence on other enabling machines and devices: Secondary | ICD-10-CM

## 2019-06-16 DIAGNOSIS — G4733 Obstructive sleep apnea (adult) (pediatric): Secondary | ICD-10-CM | POA: Diagnosis not present

## 2019-06-24 ENCOUNTER — Other Ambulatory Visit: Payer: Self-pay | Admitting: Family Medicine

## 2019-06-29 NOTE — Addendum Note (Signed)
Addended by: Larey Seat on: 06/29/2019 05:46 PM   Modules accepted: Orders

## 2019-06-29 NOTE — Procedures (Signed)
Patient Information     First Name: Linda Last Name: Buckley ID: [id number]  Birth Date: 1956-10-12 Age: 63 Gender: Female  Referring Provider:  BMI: 34.4 (W=214 lb, H=5' 6'')  Neck Circ.:  16 ''    Sleep Study Information    Study Date: after Jun 16, 2019 S/H/A Version: July 7th 2020  5.1.77.7 / 4.1.1528 / 5  History:      [name] is a 63 year old afro-american female patient who is already using CPAP for OSA therapy.  She is referred by Ward Givens, NP whom she had seen in a virtual visit on 05-24-2019.               Summary & Diagnosis:    This HST identified a mild sleep apnea with an AHI of 9.7/h , exacerbated during REM sleep to AHI 21.9/h. There was normal variability of heart rate noted. No prolonged hypoxemia. Moderate snoring.  Remarkably AHI was lower in supine sleep than prone sleep (?).   Recommendations:     A dental device would not be advised given the strong REM depend apnea type noted here. The best treatment is CPAP, followed by [facility] device.  I will order auto CPAP from 6-14 cm water.3 cm EPR and mask of patient's choice, and heated humidity.   I also recommend to change the sleep position to lateral sleep.            Sleep Summary  Oxygen Saturation Statistics   Start Study Time: End Study Time: Total Recording Time:  10:39:57 PM   7:17:47 AM   8 h, 37 min  Total Sleep Time % REM of Sleep Time:  7 h, 54 min  22.9    Mean: 95 Minimum: 86 Maximum: 100  Mean of Desaturations Nadirs (%):   92  Oxygen Desaturation %:   4-9 10-20 >20 Total  Events Number Total    25  1 96.2 3.8  0 0.0  26 100.0  Oxygen Saturation: <90 <=88 <85 <80 <70  Duration (minutes): Sleep % 0.3 0.1  0.2 0.0  0.0 0.0 0.0 0.0 0.0 0.0     Respiratory Indices      Total Events REM NREM All Night  pRDI:  73  pAHI:  70 ODI:  26  pAHIc:  0  % CSR: 0.0 23.6 21.9 11.5 0.0 5.3 5.3 1.0 0.0 9.4 9.0 3.3 0.0       Pulse Rate Statistics  during Sleep (BPM)      Mean: 63 Minimum: 51 Maximum: 110    Indices are calculated using technically valid sleep time of  7 hrs, 48 min. Central-Indices are calculated using technically valid sleep time of  7  hrs, 38 min. pRDI/pAHI are calculated using oxi desaturations ? 3%  Body Position Statistics  Position Supine Prone Right Left Non-Supine  Sleep (min) 449.0 25.8 0.0 0.0 25.8  Sleep % 94.6 5.4 0.0 0.0 5.4  pRDI 8.7 20.9 N/A N/A 20.9  pAHI 8.4 18.6 N/A N/A 18.6  ODI 3.4 2.3 N/A N/A 2.3     Snoring Statistics Snoring Level (dB) >40 >50 >60 >70 >80 >Threshold (45)  Sleep (min) 216.6 6.3 2.6 0.7 0.0 13.9  Sleep % 45.6 1.3 0.5 0.1 0.0 2.9    Mean: 41 dB Sleep Stages Chart              pAHI=9.0  Mild              Moderate                    Severe                                                 5              15                    30

## 2019-06-30 ENCOUNTER — Telehealth: Payer: Self-pay | Admitting: Neurology

## 2019-06-30 NOTE — Telephone Encounter (Signed)
-----   Message from Larey Seat, MD sent at 06/29/2019  5:46 PM EDT ----- This HST identified a mild sleep apnea with an AHI of 9.7/h , exacerbated during REM sleep to AHI 21.9/h. There was normal variability of heart rate noted. No prolonged hypoxemia. Moderate snoring.  Remarkably AHI was lower in supine sleep than prone sleep (?).   Recommendations:    A dental device would not be advised given the strong REM depend apnea type noted here. The best treatment is CPAP, followed by [facility] device.  I will order auto CPAP from 6-14 cm water, plus 3 cm water EPR and mask of patient's choice, and using heated humidity.     PS : If this patient is already on CPAP and has an autotitration capable machine, I will order to reset the device to the new pressures prescribed.  I also recommend to change the sleep position to lateral sleep.

## 2019-06-30 NOTE — Telephone Encounter (Signed)
I called pt. I advised pt that Dr. Brett Fairy reviewed their sleep study results and found that pt has mild to moderate sleep apnea. Dr. Roxy Horseman recommends that pt continue to use the auto CPAP. She made the changes to auto CPAP 6-14 cm water pressure. I reviewed PAP compliance expectations with the pt. Pt is agreeable to starting a CPAP. I advised pt that an order will be sent to a DME, [facility], and [facility] will call the pt within about one week after they file with the pt's insurance. A follow up appt was made for insurance purposes with Dr. Brett Fairy on October 15,2020 at 10:30 am. Pt verbalized understanding to arrive 15 minutes early and bring their CPAP. A letter with all of this information in it will be mailed to the pt as a reminder. I verified with the pt that the address we have on file is correct. Pt verbalized understanding of results. Pt had no questions at this time but was encouraged to call back if questions arise. I have sent the order to [facility] and have received confirmation that they have received the order.

## 2019-07-05 NOTE — Telephone Encounter (Signed)
[facility] replied stating that the patient is not currently eligible for a new machine until 2022, so pressures have been sent to her current machine. They also made her aware of this.

## 2019-07-09 ENCOUNTER — Other Ambulatory Visit: Payer: Self-pay | Admitting: Family Medicine

## 2019-07-16 ENCOUNTER — Ambulatory Visit: Payer: Medicare Other | Admitting: Orthopaedic Surgery

## 2019-07-26 ENCOUNTER — Telehealth: Payer: Self-pay | Admitting: Neurology

## 2019-07-26 NOTE — Telephone Encounter (Signed)
I called patient regarding rescheduling 8/17 appt due to unavailable scheduled time of 7:45. Requested patient call back to reschedule.

## 2019-07-27 ENCOUNTER — Encounter: Payer: Self-pay | Admitting: Neurology

## 2019-07-27 NOTE — Telephone Encounter (Signed)
Unable to get in contact with patient, appt has been cancelled. Letter has been sent to advise patient of cancellation.

## 2019-08-02 ENCOUNTER — Ambulatory Visit: Payer: Medicare Other | Admitting: Neurology

## 2019-08-07 ENCOUNTER — Other Ambulatory Visit: Payer: Self-pay | Admitting: Family Medicine

## 2019-08-07 DIAGNOSIS — F411 Generalized anxiety disorder: Secondary | ICD-10-CM

## 2019-08-07 DIAGNOSIS — F331 Major depressive disorder, recurrent, moderate: Secondary | ICD-10-CM

## 2019-08-18 ENCOUNTER — Other Ambulatory Visit: Payer: Self-pay

## 2019-08-18 ENCOUNTER — Ambulatory Visit (INDEPENDENT_AMBULATORY_CARE_PROVIDER_SITE_OTHER): Payer: Medicare Other | Admitting: Family Medicine

## 2019-08-18 ENCOUNTER — Encounter: Payer: Self-pay | Admitting: Family Medicine

## 2019-08-18 ENCOUNTER — Other Ambulatory Visit: Payer: Self-pay | Admitting: Family Medicine

## 2019-08-18 VITALS — Ht 66.0 in | Wt 186.0 lb

## 2019-08-18 DIAGNOSIS — M7061 Trochanteric bursitis, right hip: Secondary | ICD-10-CM | POA: Diagnosis not present

## 2019-08-18 NOTE — Telephone Encounter (Signed)
Last OV 05/26/19 Clonazepam last filled 12/31/18 #30 with 3

## 2019-08-18 NOTE — Progress Notes (Signed)
I have discussed the procedure for the virtual visit with the patient who has given consent to proceed with assessment and treatment.   Pt unable to obtain vitals.   Jessica L Brodmerkel, CMA     

## 2019-08-18 NOTE — Progress Notes (Signed)
Virtual Visit via Video   I connected with patient on 08/18/19 at  4:15 PM EDT by a video enabled telemedicine application and verified that I am speaking with the correct person using two identifiers.  Location patient: Home Location provider: Acupuncturist, Office Persons participating in the virtual visit: Patient, Provider, [place] (Jess B)  I discussed the limitations of evaluation and management by telemedicine and the availability of in person appointments. The patient expressed understanding and agreed to proceed.  Subjective:   HPI:  'i'm just aching'- pt has fibromyalgia but recently R leg has been hurting more severely and more frequently.  Pain is lateral hip.  Painful to lay on R side.  Painful to push on that area.  Pain will radiate down to just above the knee.  Some relief w/ heating pad.  Pt got a refill on Voltaren gel today.  ROS:   See pertinent positives and negatives per HPI.  Patient Active Problem List   Diagnosis Date Noted  . MDD (major depressive disorder), recurrent episode, severe ([place]) 05/05/2018  . Vitamin D deficiency 11/05/2017  . Dysphagia 09/02/2017  . OSA (obstructive sleep apnea) 02/14/2017  . Multiple falls 09/04/2016  . Hereditary and idiopathic peripheral neuropathy 04/03/2016  . Exertional shortness of breath 01/26/2016  . Left leg pain 01/26/2016  . Fibromyalgia 07/28/2015  . Right wrist pain 04/05/2015  . Physical exam 03/01/2014  . HTN (hypertension) 01/12/2014  . Pre-diabetes 01/12/2014  . Family history of lupus erythematosus 01/12/2014  . Fatigue 10/17/2013    Social History   Tobacco Use  . Smoking status: Never Smoker  . Smokeless tobacco: Never Used  Substance Use Topics  . Alcohol use: Not Currently    Comment: 2 glasses of wine weekly - reports none in 2 years    Current Outpatient Medications:  .  buPROPion (WELLBUTRIN XL) 150 MG 24 hr tablet, TAKE 1 TABLET(150 MG) BY MOUTH DAILY, Disp: 90 tablet, Rfl: 1 .   buPROPion (WELLBUTRIN XL) 300 MG 24 hr tablet, TAKE 1 TABLET(300 MG) BY MOUTH EVERY MORNING, Disp: 90 tablet, Rfl: 1 .  cetirizine (ZYRTEC) 10 MG tablet, TAKE 1 TABLET(10 MG) BY MOUTH DAILY, Disp: 30 tablet, Rfl: 0 .  Cholecalciferol (VITAMIN D PO), Take 1 tablet by mouth daily. 1000units, Disp: , Rfl:  .  clonazePAM (KLONOPIN) 0.5 MG tablet, TAKE 1 TABLET(0.5 MG) BY MOUTH AT BEDTIME, Disp: 30 tablet, Rfl: 3 .  diclofenac sodium (VOLTAREN) 1 % GEL, APPLY 4 GRAMS EXTERNALLY TO THE AFFECTED AREA FOUR TIMES DAILY, Disp: 100 g, Rfl: 3 .  DULoxetine (CYMBALTA) 60 MG capsule, TAKE ONE CAPSULE BY MOUTH TWICE DAILY, Disp: 180 capsule, Rfl: 2 .  Multiple Vitamin (MULTIVITAMIN) capsule, Take 1 capsule by mouth daily. With iron, Disp: , Rfl:  .  naproxen (NAPROSYN) 500 MG tablet, TAKE 1 TABLET(500 MG) BY MOUTH TWICE DAILY WITH A MEAL, Disp: 60 tablet, Rfl: 0 .  pregabalin (LYRICA) 100 MG capsule, One capsule in the morning and 2 in the evening, Disp: 270 capsule, Rfl: 1 .  QVAR REDIHALER 80 MCG/ACT inhaler, INHALE 1 PUFF BY MOUTH TWICE DAILY, Disp: 10.6 g, Rfl: 0 .  traZODone (DESYREL) 50 MG tablet, TAKE 1/2 TO 1 TABLET(25 TO 50 MG) BY MOUTH AT BEDTIME AS NEEDED FOR SLEEP, Disp: 30 tablet, Rfl: 0 .  ursodiol (ACTIGALL) 300 MG capsule, TK 1 C PO BID WF BEGINNING 14 DAYS AFTER SURGERY, Disp: , Rfl:  .  VENTOLIN HFA 108 (90 Base) MCG/ACT  inhaler, INHALE 2 PUFFS BY MOUTH EVERY 4 HOURS AS NEEDED FOR SHORTNESS OF BREATH, Disp: 36 g, Rfl: 0 .  vitamin E 1000 UNIT capsule, Take 2,000 Units by mouth. , Disp: , Rfl:   Allergies  Allergen Reactions  . Aspirin Swelling    Sweating/ swelling of hands and face   . Bee Venom Swelling    Swelling at site     Objective:   Ht 5\' 6"  (1.676 m)   Wt 186 lb (84.4 kg)   BMI 30.02 kg/m  AAOx3, NAD NCAT, EOMI No obvious CN deficits Coloring WNL Pt is able to speak clearly, coherently without shortness of breath or increased work of breathing.  Thought process is  linear.  Mood is appropriate.  Pt indicates pain over R greater trochanter  Assessment and Plan:   Trochanteric bursitis- new.  Pt's description of pain and location of pain consistent w/ dx.  Refill on Voltaren gel provided and pt to take Naproxen prn.  Encouraged her to ice.  If no improvement, will need to see Sports Med for injxn.  Pt expressed understanding and is in agreement w/ plan.    Annye Asa, MD 08/18/2019

## 2019-08-18 NOTE — Progress Notes (Signed)
PATIENT: Linda Buckley DOB: 1956/07/25  REASON FOR VISIT: follow up HISTORY FROM: patient  HISTORY OF PRESENT ILLNESS: Today 08/19/19  Linda Buckley is a 63 year old female with history of fibromyalgia, sleep apnea on CPAP, and memory disturbance.  She has been followed through pain center in the past, and she has a spine stimulator in place but no longer offers any benefit.  After last visit her dose of Lyrica was reduced to 100 mg in the morning, 200 mg in the evening, due to report of side effects.  Laboratory evaluation at last visit was unremarkable for evaluation of memory trouble.  She is given Lyrica and Cymbalta through this office.  She reports continued pain in her arms and legs that is chronic.  She has actually only been taking Lyrica 100 mg in the morning, 100 mg in the evening.  She continues to report some trouble with memory and speech stuttering.  She lives with her husband, she is able to drive a car.  She is able to perform all her daily activities at home.  She is on disability.  She is currently being treated by her primary care doctor for right trochanteric bursitis.  She does report 1 month ago she had a fall, due to loss of balance.  She continues to do a daily exercise program consisting of 30 minutes daily. She presents today for follow-up unaccompanied.  HISTORY 02/01/2019 Dr. Jannifer Franklin: Linda Buckley is a 63 year old right-handed black female with a history of fibromyalgia.  The patient has had significant obesity, she has been diagnosed with sleep apnea on CPAP.  The patient claims that the CPAP has improved some of the daytime drowsiness, but she remains somewhat drowsy and staggery on the Lyrica.  She has tried to get into some low-grade exercise, she may walk half a mile twice a week.  She has gone from a weight of 251 in December 2018 to a weight of 216 today.  The patient has had some problems with dizziness off and on, this is associated with her balance issue, she  has not had any falls.  The patient is no longer followed through the pain center.  She does have a spinal stimulator in place that seemed to help initially but no longer offers any benefit.  The patient has a lot of discomfort in the neck and shoulder area.  She has noted some problems with short-term memory issues recently.  She is not on B12 supplementation following a gastric sleeve procedure that was done in November 2019.  She returns to this office for an evaluation.  REVIEW OF SYSTEMS: Out of a complete 14 system review of symptoms, the patient complains only of the following symptoms, and all other reviewed systems are negative.  Memory loss, headache, numbness, speech difficulty, weakness  ALLERGIES: Allergies  Allergen Reactions  . Aspirin Swelling    Sweating/ swelling of hands and face   . Bee Venom Swelling    Swelling at site     HOME MEDICATIONS: Outpatient Medications Prior to Visit  Medication Sig Dispense Refill  . buPROPion (WELLBUTRIN XL) 150 MG 24 hr tablet TAKE 1 TABLET(150 MG) BY MOUTH DAILY 90 tablet 1  . buPROPion (WELLBUTRIN XL) 300 MG 24 hr tablet TAKE 1 TABLET(300 MG) BY MOUTH EVERY MORNING 90 tablet 1  . cetirizine (ZYRTEC) 10 MG tablet TAKE 1 TABLET(10 MG) BY MOUTH DAILY 30 tablet 0  . Cholecalciferol (VITAMIN D PO) Take 1 tablet by mouth daily. 1000units    .  clonazePAM (KLONOPIN) 0.5 MG tablet TAKE 1 TABLET(0.5 MG) BY MOUTH AT BEDTIME 30 tablet 3  . diclofenac sodium (VOLTAREN) 1 % GEL APPLY 4 GRAMS EXTERNALLY TO THE AFFECTED AREA FOUR TIMES DAILY 100 g 3  . DULoxetine (CYMBALTA) 60 MG capsule TAKE ONE CAPSULE BY MOUTH TWICE DAILY 180 capsule 2  . Multiple Vitamin (MULTIVITAMIN) capsule Take 1 capsule by mouth daily. With iron    . naproxen (NAPROSYN) 500 MG tablet TAKE 1 TABLET(500 MG) BY MOUTH TWICE DAILY WITH A MEAL 60 tablet 0  . pregabalin (LYRICA) 100 MG capsule One capsule in the morning and 2 in the evening 270 capsule 1  . QVAR REDIHALER 80  MCG/ACT inhaler INHALE 1 PUFF BY MOUTH TWICE DAILY 10.6 g 0  . traZODone (DESYREL) 50 MG tablet TAKE 1/2 TO 1 TABLET(25 TO 50 MG) BY MOUTH AT BEDTIME AS NEEDED FOR SLEEP 30 tablet 0  . ursodiol (ACTIGALL) 300 MG capsule TK 1 C PO BID WF BEGINNING 14 DAYS AFTER SURGERY    . VENTOLIN HFA 108 (90 Base) MCG/ACT inhaler INHALE 2 PUFFS BY MOUTH EVERY 4 HOURS AS NEEDED FOR SHORTNESS OF BREATH 36 g 0  . vitamin E 1000 UNIT capsule Take 2,000 Units by mouth.      No facility-administered medications prior to visit.     PAST MEDICAL HISTORY: Past Medical History:  Diagnosis Date  . Allergy   . Anxiety   . Arthritis   . Carpal tunnel syndrome of right wrist    RECURRENT  . Depression   . Dysphagia 09/02/2017  . Edema 09/02/2017  . Environmental allergies    dust mites, pollen, roaches and other insects, mold  . Fibromyalgia 04/2016  . Headache    migraines  . Hereditary and idiopathic peripheral neuropathy 04/03/2016  . History of adenomatous polyp of colon   . History of gastric ulcer   . History of kidney stones   . Hypertension   . Pneumonia   . Sleep apnea    wears CPAP  . Wears glasses     PAST SURGICAL HISTORY: Past Surgical History:  Procedure Laterality Date  . CARPAL TUNNEL RELEASE Right 2007  . CARPAL TUNNEL RELEASE Right 07/05/2015   Procedure: RIGHT HAND REVISION CARPAL TUNNEL RELEASE;  Surgeon: Iran Planas, MD;  Location: [facility];  Service: Orthopedics;  Laterality: Right;  . CARPAL TUNNEL RELEASE Left 09/2016  . CERVICAL DISC ARTHROPLASTY  03/2015  . COLONOSCOPY W/ POLYPECTOMY  04-15-2014  . CYSTO/  RIGHT RETROGRADE PYELOGRAM/ URETEROSCOPY STONE EXTRACTION/  STENT PLACEMENT  06-07-2010  . FINGER ARTHRODESIS Right 08/10/2018   Procedure: RIGHT THUMB ARTHRODESIS;  Surgeon: Milly Jakob, MD;  Location: [facility];  Service: Orthopedics;  Laterality: Right;  . LAPAROSCOPIC ASSISTED VAGINAL HYSTERECTOMY  04-25-2004   ovaries remain  . LAPAROSCOPIC GASTRIC  BAND REMOVAL WITH LAPAROSCOPIC GASTRIC SLEEVE RESECTION  10/26/2018  . TUBAL LIGATION  1982    FAMILY HISTORY: Family History  Problem Relation Age of Onset  . Asthma Father   . Diabetes Father   . Arthritis Mother   . Hypertension Mother   . Diabetes Mother   . Cancer Mother   . Stomach cancer Mother   . Diabetes Brother   . Diabetes Brother   . Asthma Son   . Alcohol abuse Son   . Anxiety disorder Son   . Schizophrenia Son   . Cancer Maternal Grandfather        colon  . Colon cancer Maternal  Grandfather   . Cancer Maternal Aunt        breast  . Cancer Maternal Uncle        lung  . Colon cancer Maternal Uncle   . Cancer Maternal Grandmother        ovarian  . Diabetes Son     SOCIAL HISTORY: Social History   Socioeconomic History  . Marital status: Married    Spouse name: Not on file  . Number of children: 2  . Years of education: 2 yrs college  . Highest education level: Not on file  Occupational History  . 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 Financial resource strain: Very hard  . Food insecurity    Worry: Often true    Inability: Often true  . Transportation needs    Medical: No    Non-medical: No  Tobacco Use  . Smoking status: Never Smoker  . Smokeless tobacco: Never Used  Substance and Sexual Activity  . Alcohol use: Not Currently    Comment: 2 glasses of wine weekly - reports none in 2 years  . Drug use: No    Comment: prescription for klonopin and oxy IR  . Sexual activity: Yes    Partners: Male    Birth control/protection: None    Comment: partial hysterectomy  Lifestyle  . Physical activity    Days per week: 0 days    Minutes per session: 0 min  . Stress: Rather much  Relationships  . Social connections    Talks on phone: More than three times a week    Gets together: Once a week    Attends religious service: More than 4 times per year    Active member of club or organization: Yes    Attends meetings of  clubs or organizations: 1 to 4 times per year    Relationship status: Married  . Intimate partner violence    Fear of current or ex partner: No    Emotionally abused: No    Physically abused: No    Forced sexual activity: No  Other Topics Concern  . Not on file  Social History Narrative   Lives at home w/ her husband   Right-handed   Drinks 2 cups of coffee weekly    3 bottle water per day      PHYSICAL EXAM  Vitals:   08/19/19 0850  BP: 119/84  Pulse: 62  Temp: 98.6 F (37 C)  TempSrc: Oral  Weight: 192 lb 6.4 oz (87.3 kg)  Height: 5\' 6"  (1.676 m)   Body mass index is 31.05 kg/m.  Generalized: Well developed, in no acute distress  MMSE - Mini Mental State Exam 02/01/2019  Orientation to time 5  Orientation to Place 5  Registration 3  Attention/ Calculation 5  Recall 2  Language- name 2 objects 2  Language- repeat 0  Language- follow 3 step command 3  Language- read & follow direction 1  Write a sentence 1  Copy design 1  Total score 28     Neurological examination  Mentation: Alert oriented to time, place, history taking. Follows all commands speech and language fluent Cranial nerve II-XII: Pupils were equal round reactive to light. Extraocular movements were full, visual field were full on confrontational test. Facial sensation and strength were normal.  Head turning and shoulder shrug  were normal and symmetric. Motor: The motor testing reveals 5 over 5 strength of all 4 extremities. Good symmetric  motor tone is noted throughout.  Sensory: Sensory testing is intact to soft touch on all 4 extremities. No evidence of extinction is noted.  Coordination: Cerebellar testing reveals good finger-nose-finger and heel-to-shin bilaterally.  Gait and station: Gait is normal, but intentional. Tandem gait is unsteady. Reflexes: Deep tendon reflexes are symmetric and normal bilaterally.   DIAGNOSTIC DATA (LABS, IMAGING, TESTING) - I reviewed patient records, labs,  notes, testing and imaging myself where available.  Lab Results  Component Value Date   WBC 2.9 (L) 01/29/2019   HGB 12.6 01/29/2019   HCT 38.0 01/29/2019   MCV 89.3 01/29/2019   PLT 225.0 01/29/2019      Component Value Date/Time   NA 144 01/29/2019 1138   K 3.6 01/29/2019 1138   CL 108 01/29/2019 1138   CO2 29 01/29/2019 1138   GLUCOSE 85 01/29/2019 1138   BUN 17 01/29/2019 1138   CREATININE 0.82 01/29/2019 1138   CREATININE 0.78 04/22/2016 1454   CALCIUM 9.5 01/29/2019 1138   PROT 6.2 01/29/2019 1138   PROT 6.6 04/03/2016 1018   ALBUMIN 3.9 01/29/2019 1138   AST 13 01/29/2019 1138   ALT 10 01/29/2019 1138   ALKPHOS 67 01/29/2019 1138   BILITOT 0.5 01/29/2019 1138   GFRNONAA 54 (L) 08/04/2018 1230   GFRAA >60 08/04/2018 1230   Lab Results  Component Value Date   CHOL 163 01/29/2019   HDL 49.10 01/29/2019   LDLCALC 100 (H) 01/29/2019   TRIG 69.0 01/29/2019   CHOLHDL 3 01/29/2019   Lab Results  Component Value Date   HGBA1C 6.0 11/05/2017   Lab Results  Component Value Date   VITAMINB12 420 02/01/2019   Lab Results  Component Value Date   TSH 0.60 01/29/2019    ASSESSMENT AND PLAN 63 y.o. year old female  has a past medical history of Allergy, Anxiety, Arthritis, Carpal tunnel syndrome of right wrist, Depression, Dysphagia (09/02/2017), Edema (09/02/2017), Environmental allergies, Fibromyalgia (04/2016), Headache, Hereditary and idiopathic peripheral neuropathy (04/03/2016), History of adenomatous polyp of colon, History of gastric ulcer, History of kidney stones, Hypertension, Pneumonia, Sleep apnea, and Wears glasses. here with:  1.  Fibromyalgia 2.  Memory disturbance 3.  Sleep apnea on CPAP  She had mistakenly decreased her dose of Lyrica, likely accounting for her increased report of pain.  She will start taking the prescribed dose of Lyrica, 100 mg in the morning, 200 mg at night.  She will continue taking Cymbalta 60 mg twice daily.  I will provide  refills of Lyrica and Cymbalta.  I encouraged her to continue her daily exercise program.  We will follow her memory overtime. She will follow-up in this office in 6 months or sooner if needed.  I did advise if her symptoms worsen or if she develops any new symptoms she should let us know. It appears she had repeat sleep study, post weight loss, that showed continued OSA, recommending CPAP.   I spent 15 minutes with the patient. 50% of this time was spent discussing her plan of care.   Butler Denmark, AGNP-C, DNP 08/19/2019, 8:56 AM [facility] [address] [place], [place] [address] [phone]

## 2019-08-19 ENCOUNTER — Ambulatory Visit: Payer: Medicare Other | Admitting: Neurology

## 2019-08-19 ENCOUNTER — Other Ambulatory Visit: Payer: Self-pay

## 2019-08-19 ENCOUNTER — Encounter: Payer: Self-pay | Admitting: Neurology

## 2019-08-19 ENCOUNTER — Ambulatory Visit: Payer: Medicare Other

## 2019-08-19 VITALS — BP 119/84 | HR 62 | Temp 98.6°F | Ht 66.0 in | Wt 192.4 lb

## 2019-08-19 DIAGNOSIS — F331 Major depressive disorder, recurrent, moderate: Secondary | ICD-10-CM | POA: Diagnosis not present

## 2019-08-19 DIAGNOSIS — F411 Generalized anxiety disorder: Secondary | ICD-10-CM | POA: Diagnosis not present

## 2019-08-19 DIAGNOSIS — M797 Fibromyalgia: Secondary | ICD-10-CM

## 2019-08-19 MED ORDER — PREGABALIN 100 MG PO CAPS: ORAL_CAPSULE | ORAL | 1 refills | Status: DC

## 2019-08-19 MED ORDER — DULOXETINE HCL 60 MG PO CPEP: 60.0000 mg | ORAL_CAPSULE | Freq: Two times a day (BID) | ORAL | 2 refills | Status: DC

## 2019-08-19 NOTE — Progress Notes (Signed)
I have read the note, and I agree with the clinical assessment and plan.  Kristyl Athens K Zuhair Lariccia   

## 2019-08-19 NOTE — Patient Instructions (Signed)
Continue taking Lyrica 100 mg capsule, 1 in the morning, 2 in the evening. Keep Cymbalta.

## 2019-08-27 ENCOUNTER — Ambulatory Visit (INDEPENDENT_AMBULATORY_CARE_PROVIDER_SITE_OTHER): Payer: Medicare Other | Admitting: Orthopaedic Surgery

## 2019-08-27 ENCOUNTER — Ambulatory Visit: Payer: Self-pay

## 2019-08-27 ENCOUNTER — Encounter: Payer: Self-pay | Admitting: Orthopaedic Surgery

## 2019-08-27 ENCOUNTER — Ambulatory Visit: Payer: Medicare Other | Admitting: Orthopaedic Surgery

## 2019-08-27 VITALS — Ht 66.0 in | Wt 187.0 lb

## 2019-08-27 DIAGNOSIS — M25512 Pain in left shoulder: Secondary | ICD-10-CM | POA: Diagnosis not present

## 2019-08-27 DIAGNOSIS — G8929 Other chronic pain: Secondary | ICD-10-CM

## 2019-08-27 DIAGNOSIS — M25551 Pain in right hip: Secondary | ICD-10-CM | POA: Diagnosis not present

## 2019-08-27 MED ORDER — ACETAMINOPHEN-CODEINE #3 300-30 MG PO TABS: 1.0000 | ORAL_TABLET | Freq: Three times a day (TID) | ORAL | 0 refills | Status: DC | PRN

## 2019-08-27 NOTE — Progress Notes (Signed)
Office Visit Note   Patient: Linda Buckley           Date of Birth: 03-Dec-1956           MRN: [id number] Visit Date: 08/27/2019              Requested by: Midge Minium, MD [name] [place],  [place] [address] PCP: Midge Minium, MD   Assessment & Plan: Visit Diagnoses:  1. Pain in right hip   2. Chronic left shoulder pain     Plan: Impression is right hip early arthritis versus possible labral tear and left shoulder rotator cuff tendinitis and symptomatic os acromiale he.  In regards to the right hip, she will follow-up with Dr. Junius Roads next week for cortisone injection.  In regards to the left shoulder, we will go ahead and obtain an MRI to further assess for structural abnormalities.  She will follow-up with [name] once that is been completed.  I am agreed to call in one prescription of Tylenol 3 in the meantime.  Follow-Up Instructions: Return for after MRI.   Orders:  Orders Placed This Encounter  Procedures  . XR HIP UNILAT W OR W/O PELVIS 2-3 VIEWS RIGHT  . MR Shoulder Left w/o contrast   No orders of the defined types were placed in this encounter.     Procedures: No procedures performed   Clinical Data: No additional findings.   Subjective: Chief Complaint  Patient presents with  . Left Shoulder - Follow-up  . Right Hip - Pain    HPI patient is a pleasant 63 year old female who presents our clinic today with right hip and recurrent left shoulder pain.  In regards to the hip, she has had pain here for the past 4 to 5 months.  The pain is to the groin and anterior thigh.  She notes occasional pain to the buttocks.  Pain is worse sleeping on the right side as well as with ambulation.  She is tried ice and heat without relief of symptoms.  She denies any radicular symptoms.  In regards to the left shoulder, this has been ongoing for the past several months.  She does have a chronic os acromiale he but majority of her pain has been to the deltoid.   She saw [name] in June of this past year where subacromial cortisone injection was performed.  She notes significant relief up until about 2 weeks ago.  The pain has returned and is started to worsen.  The majority of her pain is to the deltoid.  Pain is worse with lifting her arm.  No significant weakness.  She denies any radicular symptoms.  Review of Systems as detailed in HPI.  All others reviewed and are negative.   Objective: Vital Signs: Ht 5\' 6"  (1.676 m)   Wt 187 lb (84.8 kg)   BMI 30.18 kg/m   Physical Exam well-developed well-nourished female no acute distress.  Alert oriented x3.  Examination of her right hip reveals pain with the extremes of internal rotation.  Minimally positive straight leg raise.  Left shoulder has full active range of motion all planes.  She can internally rotate to T12.  Minimally positive empty can and cross body adduction.  Mild to moderate tenderness over the [facility] joint.  She has near full strength throughout.  She is neurovascular intact distally.  Specialty Comments:  No specialty comments available.  Imaging: Xr Hip Unilat W Or W/o Pelvis 2-3 Views Right  Result Date: 08/27/2019 Minimal joint space narrowing to the right hip.    PMFS History: Patient Active Problem List   Diagnosis Date Noted  . MDD (major depressive disorder), recurrent episode, severe ([place]) 05/05/2018  . Vitamin D deficiency 11/05/2017  . Dysphagia 09/02/2017  . OSA (obstructive sleep apnea) 02/14/2017  . Multiple falls 09/04/2016  . Hereditary and idiopathic peripheral neuropathy 04/03/2016  . Exertional shortness of breath 01/26/2016  . Left leg pain 01/26/2016  . Fibromyalgia 07/28/2015  . Right wrist pain 04/05/2015  . Physical exam 03/01/2014  . HTN (hypertension) 01/12/2014  . Pre-diabetes 01/12/2014  . Family history of lupus erythematosus 01/12/2014  . Fatigue 10/17/2013   Past Medical History:  Diagnosis Date  . Allergy   . Anxiety   . Arthritis   .  Carpal tunnel syndrome of right wrist    RECURRENT  . Depression   . Dysphagia 09/02/2017  . Edema 09/02/2017  . Environmental allergies    dust mites, pollen, roaches and other insects, mold  . Fibromyalgia 04/2016  . Headache    migraines  . Hereditary and idiopathic peripheral neuropathy 04/03/2016  . History of adenomatous polyp of colon   . History of gastric ulcer   . History of kidney stones   . Hypertension   . Pneumonia   . Sleep apnea    wears CPAP  . Wears glasses     Family History  Problem Relation Age of Onset  . Asthma Father   . Diabetes Father   . Arthritis Mother   . Hypertension Mother   . Diabetes Mother   . Cancer Mother   . Stomach cancer Mother   . Diabetes Brother   . Diabetes Brother   . Asthma Son   . Alcohol abuse Son   . Anxiety disorder Son   . Schizophrenia Son   . Cancer Maternal Grandfather        colon  . Colon cancer Maternal Grandfather   . Cancer Maternal Aunt        breast  . Cancer Maternal Uncle        lung  . Colon cancer Maternal Uncle   . Cancer Maternal Grandmother        ovarian  . Diabetes Son     Past Surgical History:  Procedure Laterality Date  . CARPAL TUNNEL RELEASE Right 2007  . CARPAL TUNNEL RELEASE Right 07/05/2015   Procedure: RIGHT HAND REVISION CARPAL TUNNEL RELEASE;  Surgeon: Iran Planas, MD;  Location: [facility];  Service: Orthopedics;  Laterality: Right;  . CARPAL TUNNEL RELEASE Left 09/2016  . CERVICAL DISC ARTHROPLASTY  03/2015  . COLONOSCOPY W/ POLYPECTOMY  04-15-2014  . CYSTO/  RIGHT RETROGRADE PYELOGRAM/ URETEROSCOPY STONE EXTRACTION/  STENT PLACEMENT  06-07-2010  . FINGER ARTHRODESIS Right 08/10/2018   Procedure: RIGHT THUMB ARTHRODESIS;  Surgeon: Milly Jakob, MD;  Location: [facility];  Service: Orthopedics;  Laterality: Right;  . LAPAROSCOPIC ASSISTED VAGINAL HYSTERECTOMY  04-25-2004   ovaries remain  . LAPAROSCOPIC GASTRIC BAND REMOVAL WITH LAPAROSCOPIC GASTRIC SLEEVE RESECTION   10/26/2018  . TUBAL LIGATION  1982   Social History   Occupational History  . 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 Smoking status: Never Smoker  . Smokeless tobacco: Never Used  Substance and Sexual Activity  . Alcohol use: Not Currently    Comment: 2 glasses of wine weekly - reports none in 2 years  . Drug use: No  Comment: prescription for klonopin and oxy IR  . Sexual activity: Yes    Partners: Male    Birth control/protection: None    Comment: partial hysterectomy

## 2019-08-31 ENCOUNTER — Ambulatory Visit: Payer: Medicare Other | Admitting: Family Medicine

## 2019-09-01 ENCOUNTER — Telehealth: Payer: Self-pay | Admitting: Radiology

## 2019-09-01 ENCOUNTER — Other Ambulatory Visit: Payer: Self-pay | Admitting: Radiology

## 2019-09-01 DIAGNOSIS — G8929 Other chronic pain: Secondary | ICD-10-CM

## 2019-09-01 DIAGNOSIS — M25512 Pain in left shoulder: Secondary | ICD-10-CM

## 2019-09-01 NOTE — Telephone Encounter (Signed)
Patient unable to have MRI of shoulder due to having neuro-stimulator. Would you like a different order placed?  Any additional questions call Judson Roch [phone] ext. [phone]

## 2019-09-01 NOTE — Telephone Encounter (Signed)
CT arthrogram please. Thanks.

## 2019-09-01 NOTE — Telephone Encounter (Signed)
Orders placed.

## 2019-09-02 ENCOUNTER — Ambulatory Visit: Payer: Medicare Other | Admitting: Family Medicine

## 2019-09-13 ENCOUNTER — Ambulatory Visit
Admission: RE | Admit: 2019-09-13 | Discharge: 2019-09-13 | Disposition: A | Discharge status: Home / Self Care | Payer: Medicare Other | Source: Ambulatory Visit | Attending: Orthopaedic Surgery | Admitting: Orthopaedic Surgery

## 2019-09-13 ENCOUNTER — Other Ambulatory Visit: Payer: Self-pay

## 2019-09-13 DIAGNOSIS — G8929 Other chronic pain: Secondary | ICD-10-CM | POA: Diagnosis not present

## 2019-09-13 DIAGNOSIS — M25512 Pain in left shoulder: Secondary | ICD-10-CM | POA: Diagnosis not present

## 2019-09-13 MED ORDER — IOPAMIDOL (ISOVUE-M 200) INJECTION 41%
13.0000 mL | Freq: Once | INTRAMUSCULAR | Status: AC
  Administered 2019-09-13: 13 mL via INTRA_ARTICULAR

## 2019-09-14 NOTE — Progress Notes (Signed)
Needs fu appt please

## 2019-09-16 ENCOUNTER — Other Ambulatory Visit: Payer: Self-pay | Admitting: General Practice

## 2019-09-16 MED ORDER — NAPROXEN 500 MG PO TABS: ORAL_TABLET | ORAL | 0 refills | Status: DC

## 2019-09-17 ENCOUNTER — Ambulatory Visit (INDEPENDENT_AMBULATORY_CARE_PROVIDER_SITE_OTHER): Payer: Medicare Other | Admitting: Orthopaedic Surgery

## 2019-09-17 ENCOUNTER — Encounter: Payer: Self-pay | Admitting: Orthopaedic Surgery

## 2019-09-17 DIAGNOSIS — M25512 Pain in left shoulder: Secondary | ICD-10-CM | POA: Diagnosis not present

## 2019-09-17 DIAGNOSIS — G8929 Other chronic pain: Secondary | ICD-10-CM | POA: Diagnosis not present

## 2019-09-17 NOTE — Progress Notes (Signed)
Office Visit Note   Patient: Linda Buckley           Date of Birth: 1956-01-18           MRN: [id number] Visit Date: 09/17/2019              Requested by: Midge Minium, MD [name] [place],  [place] [address] PCP: Midge Minium, MD   Assessment & Plan: Visit Diagnoses:  1. Chronic left shoulder pain     Plan: Impression is chronic left shoulder pain with a possibility of a previous non union of an acromion fracture.  Patient had significant relief with previous cortisone injection, so we will repeat that today as well as start her in formal physical therapy.  Her pain is multifactorial with likely rotator cuff tendinosis, os acromiale, and fibromyalgia.    Follow-Up Instructions: Return if symptoms worsen or fail to improve.   Orders:  No orders of the defined types were placed in this encounter.  No orders of the defined types were placed in this encounter.     Procedures: No procedures performed   Clinical Data: No additional findings.   Subjective: Chief Complaint  Patient presents with  . Left Shoulder - Pain, Follow-up    HPI patient is a pleasant 63 year old female who presents our clinic to discuss CT scan of her left shoulder.  She has had left shoulder pain since March of this past year.  No specific injury.  It has progressively worsened.  Subacromial cortisone injection provided significant relief, but was not long-lasting.  She was unable to proceed with MRI due to a nerve stimulator.  CT scan was performed of the left shoulder which showed an unfused os acromiale.  No other acute findings.  Patient has continued to have pain to the top of the left shoulder radiating into her entire arm.  Of note, she is status post C6-7 fusion.  Review of Systems as detailed in HPI.  All others reviewed and are negative.   Objective: Vital Signs: There were no vitals taken for this visit.  Physical Exam well-developed and well-nourished female  in no acute distress.  Alert and oriented x3.  Ortho Exam   Tenderness to os acromiale Pain with empty can testing.  Pain with cross body adduction.  Rotator cuff strength is grossly intact with moderate pain. She endorses some pain that radiates down into her axilla and elbow. Specialty Comments:  No specialty comments available.  Imaging: No new imaging   PMFS History: Patient Active Problem List   Diagnosis Date Noted  . Chronic left shoulder pain 09/17/2019  . MDD (major depressive disorder), recurrent episode, severe ([place]) 05/05/2018  . Vitamin D deficiency 11/05/2017  . Dysphagia 09/02/2017  . OSA (obstructive sleep apnea) 02/14/2017  . Multiple falls 09/04/2016  . Hereditary and idiopathic peripheral neuropathy 04/03/2016  . Exertional shortness of breath 01/26/2016  . Left leg pain 01/26/2016  . Fibromyalgia 07/28/2015  . Right wrist pain 04/05/2015  . Physical exam 03/01/2014  . HTN (hypertension) 01/12/2014  . Pre-diabetes 01/12/2014  . Family history of lupus erythematosus 01/12/2014  . Fatigue 10/17/2013   Past Medical History:  Diagnosis Date  . Allergy   . Anxiety   . Arthritis   . Carpal tunnel syndrome of right wrist    RECURRENT  . Depression   . Dysphagia 09/02/2017  . Edema 09/02/2017  . Environmental allergies    dust mites, pollen, roaches and other  insects, mold  . Fibromyalgia 04/2016  . Headache    migraines  . Hereditary and idiopathic peripheral neuropathy 04/03/2016  . History of adenomatous polyp of colon   . History of gastric ulcer   . History of kidney stones   . Hypertension   . Pneumonia   . Sleep apnea    wears CPAP  . Wears glasses     Family History  Problem Relation Age of Onset  . Asthma Father   . Diabetes Father   . Arthritis Mother   . Hypertension Mother   . Diabetes Mother   . Cancer Mother   . Stomach cancer Mother   . Diabetes Brother   . Diabetes Brother   . Asthma Son   . Alcohol abuse Son   . Anxiety  disorder Son   . Schizophrenia Son   . Cancer Maternal Grandfather        colon  . Colon cancer Maternal Grandfather   . Cancer Maternal Aunt        breast  . Cancer Maternal Uncle        lung  . Colon cancer Maternal Uncle   . Cancer Maternal Grandmother        ovarian  . Diabetes Son     Past Surgical History:  Procedure Laterality Date  . CARPAL TUNNEL RELEASE Right 2007  . CARPAL TUNNEL RELEASE Right 07/05/2015   Procedure: RIGHT HAND REVISION CARPAL TUNNEL RELEASE;  Surgeon: Iran Planas, MD;  Location: [facility];  Service: Orthopedics;  Laterality: Right;  . CARPAL TUNNEL RELEASE Left 09/2016  . CERVICAL DISC ARTHROPLASTY  03/2015  . COLONOSCOPY W/ POLYPECTOMY  04-15-2014  . CYSTO/  RIGHT RETROGRADE PYELOGRAM/ URETEROSCOPY STONE EXTRACTION/  STENT PLACEMENT  06-07-2010  . FINGER ARTHRODESIS Right 08/10/2018   Procedure: RIGHT THUMB ARTHRODESIS;  Surgeon: Milly Jakob, MD;  Location: [facility];  Service: Orthopedics;  Laterality: Right;  . LAPAROSCOPIC ASSISTED VAGINAL HYSTERECTOMY  04-25-2004   ovaries remain  . LAPAROSCOPIC GASTRIC BAND REMOVAL WITH LAPAROSCOPIC GASTRIC SLEEVE RESECTION  10/26/2018  . TUBAL LIGATION  1982   Social History   Occupational History  . 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 Smoking status: Never Smoker  . Smokeless tobacco: Never Used  Substance and Sexual Activity  . Alcohol use: Not Currently    Comment: 2 glasses of wine weekly - reports none in 2 years  . Drug use: No    Comment: prescription for klonopin and oxy IR  . Sexual activity: Yes    Partners: Male    Birth control/protection: None    Comment: partial hysterectomy

## 2019-09-17 NOTE — Progress Notes (Deleted)
Office Visit Note   Patient: Linda Buckley           Date of Birth: 07-21-1956           MRN: [id number] Visit Date: 09/17/2019              Requested by: Midge Minium, MD [name] [place],  [place] [address] PCP: Midge Minium, MD   Assessment & Plan: Visit Diagnoses:  1. Chronic left shoulder pain     Plan: ***  Follow-Up Instructions: No follow-ups on file.   Orders:  No orders of the defined types were placed in this encounter.  No orders of the defined types were placed in this encounter.     Procedures: No procedures performed   Clinical Data: No additional findings.   Subjective: Chief Complaint  Patient presents with  . Left Shoulder - Pain, Follow-up    HPI  Review of Systems   Objective: Vital Signs: There were no vitals taken for this visit.  Physical Exam  Ortho Exam Tenderness to os acromiale Pain with empty can testing.  Pain with cross body adduction.  Rotator cuff strength is grossly intact with moderate pain. She endorses some pain that radiates down into her axilla and elbow. Specialty Comments:  No specialty comments available.  Imaging: No results found.   PMFS History: Patient Active Problem List   Diagnosis Date Noted  . Chronic left shoulder pain 09/17/2019  . MDD (major depressive disorder), recurrent episode, severe ([place]) 05/05/2018  . Vitamin D deficiency 11/05/2017  . Dysphagia 09/02/2017  . OSA (obstructive sleep apnea) 02/14/2017  . Multiple falls 09/04/2016  . Hereditary and idiopathic peripheral neuropathy 04/03/2016  . Exertional shortness of breath 01/26/2016  . Left leg pain 01/26/2016  . Fibromyalgia 07/28/2015  . Right wrist pain 04/05/2015  . Physical exam 03/01/2014  . HTN (hypertension) 01/12/2014  . Pre-diabetes 01/12/2014  . Family history of lupus erythematosus 01/12/2014  . Fatigue 10/17/2013   Past Medical History:  Diagnosis Date  . Allergy   . Anxiety   .  Arthritis   . Carpal tunnel syndrome of right wrist    RECURRENT  . Depression   . Dysphagia 09/02/2017  . Edema 09/02/2017  . Environmental allergies    dust mites, pollen, roaches and other insects, mold  . Fibromyalgia 04/2016  . Headache    migraines  . Hereditary and idiopathic peripheral neuropathy 04/03/2016  . History of adenomatous polyp of colon   . History of gastric ulcer   . History of kidney stones   . Hypertension   . Pneumonia   . Sleep apnea    wears CPAP  . Wears glasses     Family History  Problem Relation Age of Onset  . Asthma Father   . Diabetes Father   . Arthritis Mother   . Hypertension Mother   . Diabetes Mother   . Cancer Mother   . Stomach cancer Mother   . Diabetes Brother   . Diabetes Brother   . Asthma Son   . Alcohol abuse Son   . Anxiety disorder Son   . Schizophrenia Son   . Cancer Maternal Grandfather        colon  . Colon cancer Maternal Grandfather   . Cancer Maternal Aunt        breast  . Cancer Maternal Uncle        lung  . Colon cancer Maternal Uncle   .  Cancer Maternal Grandmother        ovarian  . Diabetes Son     Past Surgical History:  Procedure Laterality Date  . CARPAL TUNNEL RELEASE Right 2007  . CARPAL TUNNEL RELEASE Right 07/05/2015   Procedure: RIGHT HAND REVISION CARPAL TUNNEL RELEASE;  Surgeon: Iran Planas, MD;  Location: [facility];  Service: Orthopedics;  Laterality: Right;  . CARPAL TUNNEL RELEASE Left 09/2016  . CERVICAL DISC ARTHROPLASTY  03/2015  . COLONOSCOPY W/ POLYPECTOMY  04-15-2014  . CYSTO/  RIGHT RETROGRADE PYELOGRAM/ URETEROSCOPY STONE EXTRACTION/  STENT PLACEMENT  06-07-2010  . FINGER ARTHRODESIS Right 08/10/2018   Procedure: RIGHT THUMB ARTHRODESIS;  Surgeon: Milly Jakob, MD;  Location: [facility];  Service: Orthopedics;  Laterality: Right;  . LAPAROSCOPIC ASSISTED VAGINAL HYSTERECTOMY  04-25-2004   ovaries remain  . LAPAROSCOPIC GASTRIC BAND REMOVAL WITH LAPAROSCOPIC GASTRIC  SLEEVE RESECTION  10/26/2018  . TUBAL LIGATION  1982   Social History   Occupational History  . 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 Smoking status: Never Smoker  . Smokeless tobacco: Never Used  Substance and Sexual Activity  . Alcohol use: Not Currently    Comment: 2 glasses of wine weekly - reports none in 2 years  . Drug use: No    Comment: prescription for klonopin and oxy IR  . Sexual activity: Yes    Partners: Male    Birth control/protection: None    Comment: partial hysterectomy

## 2019-09-20 ENCOUNTER — Encounter: Payer: Self-pay | Admitting: Family Medicine

## 2019-09-20 ENCOUNTER — Ambulatory Visit (INDEPENDENT_AMBULATORY_CARE_PROVIDER_SITE_OTHER): Payer: Medicare Other | Admitting: Family Medicine

## 2019-09-20 DIAGNOSIS — G8929 Other chronic pain: Secondary | ICD-10-CM

## 2019-09-20 DIAGNOSIS — M25551 Pain in right hip: Secondary | ICD-10-CM

## 2019-09-20 NOTE — Progress Notes (Signed)
Subjective: Patient is here for ultrasound-guided intra-articular right hip injection.   This is for diagnostic and therapeutic purposes. Constant pain in the anterior hip.  Objective:  Good hip ROM.  Tender at ASIS.  Pain with hip flexion against resistance.  Procedure: Ultrasound-guided right hip injection: After sterile prep with Betadine, injected 8 cc 1% lidocaine without epinephrine and 40 mg methylprednisolone using a 22-gauge spinal needle, passing the needle through the iliofemoral ligament into the femoral head/neck junction.  Injectate seen filling joint capsule.  Good immediate relief, including with hip flexion against resistance.  Follow up with Dr. Erlinda Hong.

## 2019-09-27 ENCOUNTER — Encounter: Payer: Self-pay | Admitting: Physician Assistant

## 2019-09-27 ENCOUNTER — Ambulatory Visit (INDEPENDENT_AMBULATORY_CARE_PROVIDER_SITE_OTHER): Payer: Medicare Other | Admitting: Physician Assistant

## 2019-09-27 ENCOUNTER — Other Ambulatory Visit: Payer: Self-pay

## 2019-09-27 DIAGNOSIS — Z20828 Contact with and (suspected) exposure to other viral communicable diseases: Secondary | ICD-10-CM

## 2019-09-27 DIAGNOSIS — Z20822 Contact with and (suspected) exposure to covid-19: Secondary | ICD-10-CM

## 2019-09-27 NOTE — Patient Instructions (Signed)
Instructions sent to MyChart.  Please go to [facility] [place], [place] tomorrow morning for drive-through COVID testing.  Please remain quarantined at home until results are in.  Please keep hydrated and get plenty of rest. Start a bland diet (see instructions below). Can use Immodium if needed for frequent diarrhea. Saline nasal rinses and Delsym for nasal congestion and cough.   Please fill out the daily symptom questionnaires so we can keep a closer watch on you.  If you note any severe symptoms or significant shortness of breath, please go to the ER or call 911.    Food Choices to Help Relieve Diarrhea, Adult When you have diarrhea, the foods you eat and your eating habits are very important. Choosing the right foods and drinks can help:  Relieve diarrhea.  Replace lost fluids and nutrients.  Prevent dehydration. What general guidelines should I follow?  Relieving diarrhea  Choose foods with less than 2 g or .07 oz. of fiber per serving.  Limit fats to less than 8 tsp (38 g or 1.34 oz.) a day.  Avoid the following: ? Foods and beverages sweetened with high-fructose corn syrup, honey, or sugar alcohols such as xylitol, sorbitol, and mannitol. ? Foods that contain a lot of fat or sugar. ? Fried, greasy, or spicy foods. ? High-fiber grains, breads, and cereals. ? Raw fruits and vegetables.  Eat foods that are rich in probiotics. These foods include dairy products such as yogurt and fermented milk products. They help increase healthy bacteria in the stomach and intestines (gastrointestinal tract, or GI tract).  If you have lactose intolerance, avoid dairy products. These may make your diarrhea worse.  Take medicine to help stop diarrhea (antidiarrheal medicine) only as told by your health care provider. Replacing nutrients  Eat small meals or snacks every 3-4 hours.  Eat bland foods, such as white rice, toast, or baked potato, until your diarrhea starts to get  better. Gradually reintroduce nutrient-rich foods as tolerated or as told by your health care provider. This includes: ? Well-cooked protein foods. ? Peeled, seeded, and soft-cooked fruits and vegetables. ? Low-fat dairy products.  Take vitamin and mineral supplements as told by your health care provider. Preventing dehydration  Start by sipping water or a special solution to prevent dehydration (oral rehydration solution, ORS). Urine that is clear or pale yellow means that you are getting enough fluid.  Try to drink at least 8-10 cups of fluid each day to help replace lost fluids.  You may add other liquids in addition to water, such as clear juice or decaffeinated sports drinks, as tolerated or as told by your health care provider.  Avoid drinks with caffeine, such as coffee, tea, or soft drinks.  Avoid alcohol. What foods are recommended?     The items listed may not be a complete list. Talk with your health care provider about what dietary choices are best for you. Grains White rice. White, [place], or pita breads (fresh or toasted), including plain rolls, buns, or bagels. White pasta. Saltine, soda, or graham crackers. Pretzels. Low-fiber cereal. Cooked cereals made with water (such as cornmeal, farina, or cream cereals). Plain muffins. Matzo. Melba toast. Zwieback. Vegetables Potatoes (without the skin). Most well-cooked and canned vegetables without skins or seeds. Tender lettuce. Fruits Apple sauce. Fruits canned in juice. Cooked apricots, cherries, grapefruit, peaches, pears, or plums. Fresh bananas and cantaloupe. Meats and other protein foods Baked or boiled chicken. Eggs. Tofu. Fish. Seafood. Smooth nut butters. Ground or  well-cooked tender beef, ham, veal, lamb, pork, or poultry. Dairy Plain yogurt, kefir, and unsweetened liquid yogurt. Lactose-free milk, buttermilk, skim milk, or soy milk. Low-fat or nonfat hard cheese. Beverages Water. Low-calorie sports drinks. Fruit  juices without pulp. Strained tomato and vegetable juices. Decaffeinated teas. Sugar-free beverages not sweetened with sugar alcohols. Oral rehydration solutions, if approved by your health care provider. Seasoning and other foods Bouillon, broth, or soups made from recommended foods. What foods are not recommended? The items listed may not be a complete list. Talk with your health care provider about what dietary choices are best for you. Grains Whole grain, whole wheat, bran, or rye breads, rolls, pastas, and crackers. Wild or brown rice. Whole grain or bran cereals. Barley. Oats and oatmeal. Corn tortillas or taco shells. Granola. Popcorn. Vegetables Raw vegetables. Fried vegetables. Cabbage, broccoli, Brussels sprouts, artichokes, baked beans, beet greens, corn, kale, legumes, peas, sweet potatoes, and yams. Potato skins. Cooked spinach and cabbage. Fruits Dried fruit, including raisins and dates. Raw fruits. Stewed or dried prunes. Canned fruits with syrup. Meat and other protein foods Fried or fatty meats. Deli meats. Chunky nut butters. Nuts and seeds. Beans and lentils. Berniece Salines. Hot dogs. Sausage. Dairy High-fat cheeses. Whole milk, chocolate milk, and beverages made with milk, such as milk shakes. Half-and-half. Cream. sour cream. Ice cream. Beverages Caffeinated beverages (such as coffee, tea, soda, or energy drinks). Alcoholic beverages. Fruit juices with pulp. Prune juice. Soft drinks sweetened with high-fructose corn syrup or sugar alcohols. High-calorie sports drinks. Fats and oils Butter. Cream sauces. Margarine. Salad oils. Plain salad dressings. Olives. Avocados. Mayonnaise. Sweets and desserts Sweet rolls, doughnuts, and sweet breads. Sugar-free desserts sweetened with sugar alcohols such as xylitol and sorbitol. Seasoning and other foods Honey. Hot sauce. Chili powder. Gravy. Cream-based or milk-based soups. Pancakes and waffles. Summary  When you have diarrhea, the foods  you eat and your eating habits are very important.  Make sure you get at least 8-10 cups of fluid each day, or enough to keep your urine clear or pale yellow.  Eat bland foods and gradually reintroduce healthy, nutrient-rich foods as tolerated, or as told by your health care provider.  Avoid high-fiber, fried, greasy, or spicy foods. This information is not intended to replace advice given to you by your health care provider. Make sure you discuss any questions you have with your health care provider. Document Released: 02/22/2004 Document Revised: 03/25/2019 Document Reviewed: 11/29/2016 Elsevier Patient Education  2020 [employer].

## 2019-09-27 NOTE — Progress Notes (Signed)
Virtual Visit via Video   I connected with patient on 09/27/19 at  4:00 PM EDT by a video enabled telemedicine application and verified that I am speaking with the correct person using two identifiers.  Location patient: Home Location provider: [facility], Office Persons participating in the virtual visit: Patient, Provider, [place] (Patina Moore)  I discussed the limitations of evaluation and management by telemedicine and the availability of in person appointments. The patient expressed understanding and agreed to proceed.  Subjective:   HPI:   Patient presents via video visit today c/o URI and GI symptoms starting Friday evening. Patient notes scratchy throat developing Friday night with nasal congestion and sinus pressure starting the next day. By Saturday evening/Sunday AM patient was having frequent loose stools and nausea. Notes anorexia. Denies vomiting. Denies fever, chills, aches, change in taste or smell. Did go to a bridal shower this past week. Denies known sick contact.  ROS:   See pertinent positives and negatives per HPI.  Patient Active Problem List   Diagnosis Date Noted  . Chronic left shoulder pain 09/17/2019  . MDD (major depressive disorder), recurrent episode, severe ([place]) 05/05/2018  . Vitamin D deficiency 11/05/2017  . Dysphagia 09/02/2017  . OSA (obstructive sleep apnea) 02/14/2017  . Multiple falls 09/04/2016  . Hereditary and idiopathic peripheral neuropathy 04/03/2016  . Exertional shortness of breath 01/26/2016  . Left leg pain 01/26/2016  . Fibromyalgia 07/28/2015  . Right wrist pain 04/05/2015  . Physical exam 03/01/2014  . HTN (hypertension) 01/12/2014  . Pre-diabetes 01/12/2014  . Family history of lupus erythematosus 01/12/2014  . Fatigue 10/17/2013    Social History   Tobacco Use  . Smoking status: Never Smoker  . Smokeless tobacco: Never Used  Substance Use Topics  . Alcohol use: Not Currently    Comment: 2 glasses of wine  weekly - reports none in 2 years    Current Outpatient Medications:  .  acetaminophen-codeine (TYLENOL #3) 300-30 MG tablet, Take 1 tablet by mouth 3 (three) times daily as needed for moderate pain., Disp: 30 tablet, Rfl: 0 .  buPROPion (WELLBUTRIN XL) 150 MG 24 hr tablet, TAKE 1 TABLET(150 MG) BY MOUTH DAILY, Disp: 90 tablet, Rfl: 1 .  buPROPion (WELLBUTRIN XL) 300 MG 24 hr tablet, TAKE 1 TABLET(300 MG) BY MOUTH EVERY MORNING, Disp: 90 tablet, Rfl: 1 .  cetirizine (ZYRTEC) 10 MG tablet, TAKE 1 TABLET(10 MG) BY MOUTH DAILY, Disp: 30 tablet, Rfl: 0 .  Cholecalciferol (VITAMIN D PO), Take 1 tablet by mouth daily. 1000units, Disp: , Rfl:  .  clonazePAM (KLONOPIN) 0.5 MG tablet, TAKE 1 TABLET(0.5 MG) BY MOUTH AT BEDTIME, Disp: 30 tablet, Rfl: 3 .  diclofenac sodium (VOLTAREN) 1 % GEL, APPLY 4 GRAMS EXTERNALLY TO THE AFFECTED AREA FOUR TIMES DAILY, Disp: 100 g, Rfl: 3 .  DULoxetine (CYMBALTA) 60 MG capsule, Take 1 capsule (60 mg total) by mouth 2 (two) times daily., Disp: 180 capsule, Rfl: 2 .  Multiple Vitamin (MULTIVITAMIN) capsule, Take 1 capsule by mouth daily. With iron, Disp: , Rfl:  .  naproxen (NAPROSYN) 500 MG tablet, TAKE 1 TABLET(500 MG) BY MOUTH TWICE DAILY WITH A MEAL, Disp: 60 tablet, Rfl: 0 .  pregabalin (LYRICA) 100 MG capsule, One capsule in the morning and 2 in the evening, Disp: 270 capsule, Rfl: 1 .  QVAR REDIHALER 80 MCG/ACT inhaler, INHALE 1 PUFF BY MOUTH TWICE DAILY, Disp: 10.6 g, Rfl: 0 .  traZODone (DESYREL) 50 MG tablet, TAKE 1/2 TO 1  TABLET(25 TO 50 MG) BY MOUTH AT BEDTIME AS NEEDED FOR SLEEP, Disp: 30 tablet, Rfl: 0 .  ursodiol (ACTIGALL) 300 MG capsule, TK 1 C PO BID WF BEGINNING 14 DAYS AFTER SURGERY, Disp: , Rfl:  .  VENTOLIN HFA 108 (90 Base) MCG/ACT inhaler, INHALE 2 PUFFS BY MOUTH EVERY 4 HOURS AS NEEDED FOR SHORTNESS OF BREATH, Disp: 36 g, Rfl: 0 .  vitamin E 1000 UNIT capsule, Take 2,000 Units by mouth. , Disp: , Rfl:   Allergies  Allergen Reactions  . Aspirin  Swelling    Sweating/ swelling of hands and face   . Bee Venom Swelling    Swelling at site     Objective:   There were no vitals taken for this visit.  Patient is well-developed, well-nourished in no acute distress.  Resting comfortably at home.  Head is normocephalic, atraumatic.  No labored breathing.  Speech is clear and coherent with logical content.  Patient is alert and oriented at baseline.   Assessment and Plan:   1. Suspected COVID-19 virus infection Concern for this. Will send for testing. Patient enrolled in symptom monitoring program after helping patient set up her MyChart. Supportive measures and OTC medications reviewed. She is to quarantine until results are in.  - MyChart COVID-19 home monitoring program; Future - Temperature monitoring; Future    Leeanne Rio, PA-C 09/27/2019

## 2019-09-27 NOTE — Progress Notes (Signed)
I have discussed the procedure for the virtual visit with the patient who has given consent to proceed with assessment and treatment.   Cordell Guercio S Evah Rashid, CMA     

## 2019-09-28 ENCOUNTER — Other Ambulatory Visit: Payer: Self-pay

## 2019-09-28 DIAGNOSIS — Z20822 Contact with and (suspected) exposure to covid-19: Secondary | ICD-10-CM

## 2019-09-30 ENCOUNTER — Telehealth (INDEPENDENT_AMBULATORY_CARE_PROVIDER_SITE_OTHER): Payer: Medicare Other | Admitting: Physician Assistant

## 2019-09-30 ENCOUNTER — Ambulatory Visit: Payer: Medicare Other | Admitting: Neurology

## 2019-09-30 ENCOUNTER — Encounter: Payer: Self-pay | Admitting: Physician Assistant

## 2019-09-30 DIAGNOSIS — T7840XA Allergy, unspecified, initial encounter: Secondary | ICD-10-CM | POA: Diagnosis not present

## 2019-09-30 LAB — NOVEL CORONAVIRUS, NAA: SARS-CoV-2, NAA: NOT DETECTED

## 2019-09-30 MED ORDER — METHYLPREDNISOLONE 4 MG PO TBPK: ORAL_TABLET | ORAL | 0 refills | Status: DC

## 2019-09-30 NOTE — Progress Notes (Signed)
Virtual Visit via Video   I connected with patient on 09/30/19 at 10:00 AM EDT by a video enabled telemedicine application and verified that I am speaking with the correct person using two identifiers.  Location patient: Home Location provider: [facility] Persons participating in the virtual visit: Patient, Provider, [place] (Patina Moore)  I discussed the limitations of evaluation and management by telemedicine and the availability of in person appointments. The patient expressed understanding and agreed to proceed.  Subjective:   HPI:   Patient presents via [facility] today c/o a few days of facial swelling described as around her eyelids and cheeks bilaterally.  Was worsening over the first couple of days since onset but has since stabilized.  Notes some itching of throat without SOB.  Denies fever, chills, malaise or fatigue.  Denies change in soaps, lotions or detergents.  Does have a pet in the home but he is not allowed on furniture.  Of note, patient has been tested for COVID (negative) as she was seen a few days ago for sore throat, headache, diarrhea and nausea. Notes these symptoms are resolved.   ROS:   See pertinent positives and negatives per HPI.  Patient Active Problem List   Diagnosis Date Noted  . Chronic left shoulder pain 09/17/2019  . MDD (major depressive disorder), recurrent episode, severe ([place]) 05/05/2018  . Vitamin D deficiency 11/05/2017  . Dysphagia 09/02/2017  . OSA (obstructive sleep apnea) 02/14/2017  . Multiple falls 09/04/2016  . Hereditary and idiopathic peripheral neuropathy 04/03/2016  . Exertional shortness of breath 01/26/2016  . Left leg pain 01/26/2016  . Fibromyalgia 07/28/2015  . Right wrist pain 04/05/2015  . Physical exam 03/01/2014  . HTN (hypertension) 01/12/2014  . Pre-diabetes 01/12/2014  . Family history of lupus erythematosus 01/12/2014  . Fatigue 10/17/2013    Social History   Tobacco Use  . Smoking status:  Never Smoker  . Smokeless tobacco: Never Used  Substance Use Topics  . Alcohol use: Not Currently    Comment: 2 glasses of wine weekly - reports none in 2 years    Current Outpatient Medications:  .  acetaminophen-codeine (TYLENOL #3) 300-30 MG tablet, Take 1 tablet by mouth 3 (three) times daily as needed for moderate pain., Disp: 30 tablet, Rfl: 0 .  buPROPion (WELLBUTRIN XL) 150 MG 24 hr tablet, TAKE 1 TABLET(150 MG) BY MOUTH DAILY, Disp: 90 tablet, Rfl: 1 .  buPROPion (WELLBUTRIN XL) 300 MG 24 hr tablet, TAKE 1 TABLET(300 MG) BY MOUTH EVERY MORNING, Disp: 90 tablet, Rfl: 1 .  cetirizine (ZYRTEC) 10 MG tablet, TAKE 1 TABLET(10 MG) BY MOUTH DAILY, Disp: 30 tablet, Rfl: 0 .  Cholecalciferol (VITAMIN D PO), Take 1 tablet by mouth daily. 1000units, Disp: , Rfl:  .  clonazePAM (KLONOPIN) 0.5 MG tablet, TAKE 1 TABLET(0.5 MG) BY MOUTH AT BEDTIME, Disp: 30 tablet, Rfl: 3 .  diclofenac sodium (VOLTAREN) 1 % GEL, APPLY 4 GRAMS EXTERNALLY TO THE AFFECTED AREA FOUR TIMES DAILY, Disp: 100 g, Rfl: 3 .  DULoxetine (CYMBALTA) 60 MG capsule, Take 1 capsule (60 mg total) by mouth 2 (two) times daily., Disp: 180 capsule, Rfl: 2 .  Multiple Vitamin (MULTIVITAMIN) capsule, Take 1 capsule by mouth daily. With iron, Disp: , Rfl:  .  naproxen (NAPROSYN) 500 MG tablet, TAKE 1 TABLET(500 MG) BY MOUTH TWICE DAILY WITH A MEAL, Disp: 60 tablet, Rfl: 0 .  pregabalin (LYRICA) 100 MG capsule, One capsule in the morning and 2 in the evening, Disp:  270 capsule, Rfl: 1 .  QVAR REDIHALER 80 MCG/ACT inhaler, INHALE 1 PUFF BY MOUTH TWICE DAILY, Disp: 10.6 g, Rfl: 0 .  traZODone (DESYREL) 50 MG tablet, TAKE 1/2 TO 1 TABLET(25 TO 50 MG) BY MOUTH AT BEDTIME AS NEEDED FOR SLEEP, Disp: 30 tablet, Rfl: 0 .  ursodiol (ACTIGALL) 300 MG capsule, TK 1 C PO BID WF BEGINNING 14 DAYS AFTER SURGERY, Disp: , Rfl:  .  VENTOLIN HFA 108 (90 Base) MCG/ACT inhaler, INHALE 2 PUFFS BY MOUTH EVERY 4 HOURS AS NEEDED FOR SHORTNESS OF BREATH, Disp: 36 g,  Rfl: 0 .  vitamin E 1000 UNIT capsule, Take 2,000 Units by mouth. , Disp: , Rfl:  .  methylPREDNISolone (MEDROL DOSEPAK) 4 MG TBPK tablet, Take following package directions, Disp: 21 tablet, Rfl: 0  Allergies  Allergen Reactions  . Aspirin Swelling    Sweating/ swelling of hands and face   . Bee Venom Swelling    Swelling at site     Objective:   There were no vitals taken for this visit.  Patient is well-developed, well-nourished in no acute distress.  Resting comfortably  at home.  Head is normocephalic, atraumatic.  No labored breathing.  Speech is clear and coherent with logical content.  Patient is alert and oriented at baseline.  Swelling and edema noted left face, more significantly of periorbital areas bilaterally.  Mild conjunctival injection noted.  Assessment and Plan:   1. Allergic reaction, initial encounter Moderate.  Without difficulty breathing, tongue swelling or chest tightness.  No change to recent medications.  She is to avoid applying anything to the face, product wise.  Can continue warm compresses.  She is encouraged to start Benadryl every 6 hours over the next 48 to 72 hours.  Will start Medrol Dosepak to reduce inflammation.  Strict ER precautions reviewed with patient.  Since there is an unknown trigger may need to consider allergy testing once acute flare is resolved. - methylPREDNISolone (MEDROL DOSEPAK) 4 MG TBPK tablet; Take following package directions  Dispense: 21 tablet; Refill: 0    Leeanne Rio, [place] 09/30/2019

## 2019-09-30 NOTE — Patient Instructions (Signed)
Instructions sent to MyChart.   Please avoid touching the face. Do not apply any makeup, lotion, etc to the face.  Take Benadryl every 6 hours as needed for itching and swelling. Can continue warm compresses to the eyes. Take the steroid pack as directed.  If you note any worsening symptoms or you note difficulty swallowing or breathing, you need assessment ASAP at ER.

## 2019-10-14 DIAGNOSIS — Z23 Encounter for immunization: Secondary | ICD-10-CM | POA: Diagnosis not present

## 2019-10-19 ENCOUNTER — Other Ambulatory Visit: Payer: Self-pay | Admitting: General Practice

## 2019-10-19 MED ORDER — NAPROXEN 500 MG PO TABS: ORAL_TABLET | ORAL | 0 refills | Status: DC

## 2019-12-12 DIAGNOSIS — G4733 Obstructive sleep apnea (adult) (pediatric): Secondary | ICD-10-CM | POA: Diagnosis not present

## 2019-12-20 DIAGNOSIS — M5412 Radiculopathy, cervical region: Secondary | ICD-10-CM | POA: Diagnosis not present

## 2019-12-20 DIAGNOSIS — M5136 Other intervertebral disc degeneration, lumbar region: Secondary | ICD-10-CM | POA: Diagnosis not present

## 2019-12-20 DIAGNOSIS — G894 Chronic pain syndrome: Secondary | ICD-10-CM | POA: Diagnosis not present

## 2019-12-20 DIAGNOSIS — Z9689 Presence of other specified functional implants: Secondary | ICD-10-CM | POA: Diagnosis not present

## 2019-12-23 ENCOUNTER — Telehealth: Payer: Self-pay | Admitting: Family Medicine

## 2019-12-23 DIAGNOSIS — F411 Generalized anxiety disorder: Secondary | ICD-10-CM

## 2019-12-23 DIAGNOSIS — F331 Major depressive disorder, recurrent, moderate: Secondary | ICD-10-CM

## 2019-12-23 MED ORDER — BUPROPION HCL ER (XL) 150 MG PO TB24: ORAL_TABLET | ORAL | 1 refills | Status: DC

## 2019-12-23 MED ORDER — BUPROPION HCL ER (XL) 300 MG PO TB24: ORAL_TABLET | ORAL | 1 refills | Status: DC

## 2019-12-23 MED ORDER — TRAZODONE HCL 50 MG PO TABS: ORAL_TABLET | ORAL | 0 refills | Status: DC

## 2019-12-23 NOTE — Telephone Encounter (Signed)
Pt needs refill on both Wellbutrin 300mg  and 125mg , as well as Trazodone.  [facility]: [facility] [place] [phone]

## 2019-12-23 NOTE — Telephone Encounter (Signed)
Medication filled to pharmacy as requested.   

## 2020-01-04 DIAGNOSIS — Z79899 Other long term (current) drug therapy: Secondary | ICD-10-CM | POA: Diagnosis not present

## 2020-01-04 DIAGNOSIS — Z5181 Encounter for therapeutic drug level monitoring: Secondary | ICD-10-CM | POA: Diagnosis not present

## 2020-01-04 DIAGNOSIS — M48061 Spinal stenosis, lumbar region without neurogenic claudication: Secondary | ICD-10-CM | POA: Diagnosis not present

## 2020-01-10 DIAGNOSIS — K912 Postsurgical malabsorption, not elsewhere classified: Secondary | ICD-10-CM | POA: Diagnosis not present

## 2020-01-10 DIAGNOSIS — Z903 Acquired absence of stomach [part of]: Secondary | ICD-10-CM | POA: Diagnosis not present

## 2020-01-21 DIAGNOSIS — K912 Postsurgical malabsorption, not elsewhere classified: Secondary | ICD-10-CM | POA: Diagnosis not present

## 2020-01-21 DIAGNOSIS — Z903 Acquired absence of stomach [part of]: Secondary | ICD-10-CM | POA: Diagnosis not present

## 2020-02-01 ENCOUNTER — Ambulatory Visit: Payer: Medicare Other | Admitting: Neurology

## 2020-02-01 ENCOUNTER — Encounter: Payer: Self-pay | Admitting: Neurology

## 2020-02-01 ENCOUNTER — Other Ambulatory Visit: Payer: Self-pay | Admitting: General Practice

## 2020-02-01 ENCOUNTER — Other Ambulatory Visit: Payer: Self-pay

## 2020-02-01 VITALS — BP 122/86 | HR 77 | Temp 96.9°F | Ht 67.0 in | Wt 196.0 lb

## 2020-02-01 DIAGNOSIS — F411 Generalized anxiety disorder: Secondary | ICD-10-CM

## 2020-02-01 DIAGNOSIS — Z9114 Patient's other noncompliance with medication regimen: Secondary | ICD-10-CM | POA: Diagnosis not present

## 2020-02-01 DIAGNOSIS — G4733 Obstructive sleep apnea (adult) (pediatric): Secondary | ICD-10-CM

## 2020-02-01 DIAGNOSIS — F331 Major depressive disorder, recurrent, moderate: Secondary | ICD-10-CM

## 2020-02-01 DIAGNOSIS — Z91199 Patient's noncompliance with other medical treatment and regimen due to unspecified reason: Secondary | ICD-10-CM | POA: Insufficient documentation

## 2020-02-01 DIAGNOSIS — Z9989 Dependence on other enabling machines and devices: Secondary | ICD-10-CM | POA: Diagnosis not present

## 2020-02-01 NOTE — Patient Instructions (Addendum)
CPAP and BPAP Information CPAP and BPAP are methods of helping a person breathe with the use of air pressure. CPAP stands for "continuous positive airway pressure." BPAP stands for "bi-level positive airway pressure." In both methods, air is blown through your nose or mouth and into your air passages to help you breathe well. CPAP and BPAP use different amounts of pressure to blow air. With CPAP, the amount of pressure stays the same while you breathe in and out. With BPAP, the amount of pressure is increased when you breathe in (inhale) so that you can take larger breaths. Your health care provider will recommend whether CPAP or BPAP would be more helpful for you. Why are CPAP and BPAP treatments used? CPAP or BPAP can be helpful if you have:  Sleep apnea.  Chronic obstructive pulmonary disease (COPD).  Heart failure.  Medical conditions that weaken the muscles of the chest including muscular dystrophy, or neurological diseases such as amyotrophic lateral sclerosis (ALS).  Other problems that cause breathing to be weak, abnormal, or difficult. CPAP is most commonly used for obstructive sleep apnea (OSA) to keep the airways from collapsing when the muscles relax during sleep. How is CPAP or BPAP administered? Both CPAP and BPAP are provided by a small machine with a flexible plastic tube that attaches to a plastic mask. You wear the mask. Air is blown through the mask into your nose or mouth. The amount of pressure that is used to blow the air can be adjusted on the machine. Your health care provider will determine the pressure setting that should be used based on your individual needs. When should CPAP or BPAP be used? In most cases, the mask only needs to be worn during sleep. Generally, the mask needs to be worn throughout the night and during any daytime naps. People with certain medical conditions may also need to wear the mask at other times when they are awake. Follow instructions from your  health care provider about when to use the machine. What are some tips for using the mask?   Because the mask needs to be snug, some people feel trapped or closed-in (claustrophobic) when first using the mask. If you feel this way, you may need to get used to the mask. One way to do this is by holding the mask loosely over your nose or mouth and then gradually applying the mask more snugly. You can also gradually increase the amount of time that you use the mask.  Masks are available in various types and sizes. Some fit over your mouth and nose while others fit over just your nose. If your mask does not fit well, talk with your health care provider about getting a different one.  If you are using a mask that fits over your nose and you tend to breathe through your mouth, a chin strap may be applied to help keep your mouth closed.  The CPAP and BPAP machines have alarms that may sound if the mask comes off or develops a leak.  If you have trouble with the mask, it is very important that you talk with your health care provider about finding a way to make the mask easier to tolerate. Do not stop using the mask. Stopping the use of the mask could have a negative impact on your health. What are some tips for using the machine?  Place your CPAP or BPAP machine on a secure table or stand near an electrical outlet.  Know where the on/off   switch is located on the machine.  Follow instructions from your health care provider about how to set the pressure on your machine and when you should use it.  Do not eat or drink while the CPAP or BPAP machine is on. Food or fluids could get pushed into your lungs by the pressure of the CPAP or BPAP.  Do not smoke. Tobacco smoke residue can damage the machine.  For home use, CPAP and BPAP machines can be rented or purchased through home health care companies. Many different brands of machines are available. Renting a machine before purchasing may help you find out  which particular machine works well for you.  Keep the CPAP or BPAP machine and attachments clean. Ask your health care provider for specific instructions. Get help right away if:  You have redness or open areas around your nose or mouth where the mask fits.  You have trouble using the CPAP or BPAP machine.  You cannot tolerate wearing the CPAP or BPAP mask.  You have pain, discomfort, and bloating in your abdomen. Summary  CPAP and BPAP are methods of helping a person breathe with the use of air pressure.  Both CPAP and BPAP are provided by a small machine with a flexible plastic tube that attaches to a plastic mask.  If you have trouble with the mask, it is very important that you talk with your health care provider about finding a way to make the mask easier to tolerate. This information is not intended to replace advice given to you by your health care provider. Make sure you discuss any questions you have with your health care provider. Document Revised: 03/24/2019 Document Reviewed: 10/21/2016 Elsevier Patient Education  [place].  Myofascial Pain Syndrome and Fibromyalgia Myofascial pain syndrome and fibromyalgia are both pain disorders. This pain may be felt mainly in your muscles.  Myofascial pain syndrome: ? Always has tender points in the muscle that will cause pain when pressed (trigger points). The pain may come and go. ? Usually affects your neck, upper back, and shoulder areas. The pain often radiates into your arms and hands.  Fibromyalgia: ? Has muscle pains and tenderness that come and go. ? Is often associated with fatigue and sleep problems. ? Has trigger points. ? Tends to be long-lasting (chronic), but is not life-threatening. Fibromyalgia and myofascial pain syndrome are not the same. However, they often occur together. If you have both conditions, each can make the other worse. Both are common and can cause enough pain and fatigue to make  day-to-day activities difficult. Both can be hard to diagnose because their symptoms are common in many other conditions. What are the causes? The exact causes of these conditions are not known. What increases the risk? You are more likely to develop this condition if:  You have a family history of the condition.  You have certain triggers, such as: ? Spine disorders. ? An injury (trauma) or other physical stressors. ? Being under a lot of stress. ? Medical conditions such as osteoarthritis, rheumatoid arthritis, or lupus. What are the signs or symptoms? Fibromyalgia The main symptom of fibromyalgia is widespread pain and tenderness in your muscles. Pain is sometimes described as stabbing, shooting, or burning. You may also have:  Tingling or numbness.  Sleep problems and fatigue.  Problems with attention and concentration (fibro fog). Other symptoms may include:  Bowel and bladder problems.  Headaches.  Visual problems.  Problems with odors and noises.  Depression or mood  changes.  Painful menstrual periods (dysmenorrhea).  Dry skin or eyes. These symptoms can vary over time. Myofascial pain syndrome Symptoms of myofascial pain syndrome include:  Tight, ropy bands of muscle.  Uncomfortable sensations in muscle areas. These may include aching, cramping, burning, numbness, tingling, and weakness.  Difficulty moving certain parts of the body freely (poor range of motion). How is this diagnosed? This condition may be diagnosed by your symptoms and medical history. You will also have a physical exam. In general:  Fibromyalgia is diagnosed if you have pain, fatigue, and other symptoms for more than 3 months, and symptoms cannot be explained by another condition.  Myofascial pain syndrome is diagnosed if you have trigger points in your muscles, and those trigger points are tender and cause pain elsewhere in your body (referred pain). How is this treated? Treatment for  these conditions depends on the type that you have.  For fibromyalgia: ? Pain medicines, such as NSAIDs. ? Medicines for treating depression. ? Medicines for treating seizures. ? Medicines that relax the muscles.  For myofascial pain: ? Pain medicines, such as NSAIDs. ? Cooling and stretching of muscles. ? Trigger point injections. ? Sound wave (ultrasound) treatments to stimulate muscles. Treating these conditions often requires a team of health care providers. These may include:  Your primary care provider.  Physical therapist.  Complementary health care providers, such as massage therapists or acupuncturists.  Psychiatrist for cognitive behavioral therapy. Follow these instructions at home: Medicines  Take over-the-counter and prescription medicines only as told by your health care provider.  Do not drive or use heavy machinery while taking prescription pain medicine.  If you are taking prescription pain medicine, take actions to prevent or treat constipation. Your health care provider may recommend that you: ? Drink enough fluid to keep your urine pale yellow. ? Eat foods that are high in fiber, such as fresh fruits and vegetables, whole grains, and beans. ? Limit foods that are high in fat and processed sugars, such as fried or sweet foods. ? Take an over-the-counter or prescription medicine for constipation. Lifestyle   Exercise as directed by your health care provider or physical therapist.  Practice relaxation techniques to control your stress. You may want to try: ? Biofeedback. ? Visual imagery. ? Hypnosis. ? Muscle relaxation. ? Yoga. ? Meditation.  Maintain a healthy lifestyle. This includes eating a healthy diet and getting enough sleep.  Do not use any products that contain nicotine or tobacco, such as cigarettes and e-cigarettes. If you need help quitting, ask your health care provider. General instructions  Talk to your health care provider about  complementary treatments, such as acupuncture or massage.  Consider joining a support group with others who are diagnosed with this condition.  Do not do activities that stress or strain your muscles. This includes repetitive motions and heavy lifting.  Keep all follow-up visits as told by your health care provider. This is important. Where to find more information  National Fibromyalgia Association: www.fmaware.[place]: www.arthritis.org  American Chronic Pain Association: www.theacpa.org Contact a health care provider if:  You have new symptoms.  Your symptoms get worse or your pain is severe.  You have side effects from your medicines.  You have trouble sleeping.  Your condition is causing depression or anxiety. Summary  Myofascial pain syndrome and fibromyalgia are pain disorders.  Myofascial pain syndrome has tender points in the muscle that will cause pain when pressed (trigger points). Fibromyalgia also has muscle pains and  tenderness that come and go, but this condition is often associated with fatigue and sleep disturbances.  Fibromyalgia and myofascial pain syndrome are not the same but often occur together, causing pain and fatigue that make day-to-day activities difficult.  Treatment for fibromyalgia includes taking medicines to relax the muscles and medicines for pain, depression, or seizures. Treatment for myofascial pain syndrome includes taking medicines for pain, cooling and stretching of muscles, and injecting medicines into trigger points.  Follow your health care provider's instructions for taking medicines and maintaining a healthy lifestyle. This information is not intended to replace advice given to you by your health care provider. Make sure you discuss any questions you have with your health care provider. Document Revised: 03/26/2019 Document Reviewed: 12/17/2017 Elsevier Patient Education  2020 [employer].

## 2020-02-01 NOTE — Progress Notes (Signed)
SLEEP MEDICINE CLINIC   Provider:  Larey Seat, MD  Referring Provider: Midge Minium, MD Primary neurologist ; Dr. Lenor Coffin, MD    Linda Buckley is a 64 year old African American female patient and was seen on 02/01/2020 in a face -to-face visit.   I have the pleasure of seeing Linda Buckley today, a right -handed Black or [place] American female with a medical history of Allergy, Anxiety, Arthritis, Carpal tunnel syndrome of right wrist, Depression, Dysphagia (09/02/2017), Edema (09/02/2017), Environmental allergies(04/2016), Headache, Hereditary and idiopathic peripheral neuropathy (04/03/2016), History of adenomatous polyp of colon, History of gastric ulcer, History of kidney stones, Hypertension, Pneumonia, Sleep apnea, and Wears glasses. The patient had a gastric sleeve procedure on 11-01-2018 , since my last visit with her. Her BMI is now at 30 kg/m2.  The patient underwent a home sleep test on 16 June 2019 with a goal to establish where her sleep apnea was after she had begun to lose weight following her gastric sleeve procedure. The patient was already using CPAP at the time and was referred by Vaughan Browner, Nurse Practitioner following a virtual visit from 05-24-2019.  The home sleep test identified very mild sleep apnea with an AHI of 9.7/h, still being exacerbated during REM sleep to an AHI of 21.9/h.  There was normal heart rate variability , no prolonged hypoxemia, and only moderate snoring noted. The best treatment was still CPAP and I ordered an autotitrating  CPAP device for her, the pressure range was limited between 6 and 14 cmH2O pressure with 3 cm EPR, plus I ordered an interface of her choice.  I also recommended to avoid the supine sleep position.  The home sleep study results were based a total sleep time estimated to be 7 hours, 54 minutes.  The patient continues to report an Epworth score of 15/24 points which indicates persistent hypersomnia.  She  has been 60% compliant with her CPAP use over 4 hours daily, average use at time is 4 hours 4 hours 51 minutes, she is using an AutoSet with a 6 cm minimum pressure of 14 cm maximum pressure and 3 cm EPR she has 2.6 central apneas residually and 1.2 obstructive apneas with a total AHI of 4.2.  No Cheyne-Stokes respiration is noted and the 95th percentile pressure is 9.7 cm well was in the current setting.  Her usage dropped after 13 January 2020- she reports to have had such leg pains that she needed to walk and couldn't use CPAP.  She ran out of trazodone - and now is deemed non-compliant.  She has seen her "back pain specialist "-  At [facility], [place].  She is followed at [facility] by Dr Jannifer Franklin, MD- she reports for "Fibromyalgia".   Interval history from 09/03/2017, Linda Buckley is established as a patient of Dr. Floyde Parkins and has seen Ward Givens in the last 2 visits with our office. She was referred by Dr. Birdie Riddle for a sleep study which I had ordered a split-night protocol. The study was performed on 11/03/2016 after the patient had endorsed an Epworth sleepiness score of 20/24 points. BMI at the time was 40 neck circumference however was only 14 inches. The patient presented with an AHI of 25.5 did not have hypoxemia, periodic limb movements, or abnormal tachybradycardia responses. Her sleep latency was 88.5 minutes indicating that it was hard for her to go to sleep and her sleep efficiency was only 58% indicating that she had both forms of  insomnia. She was titrated to CPAP at a pressure of 6.0 cm water and this was enough to completely alleviate all apnea. In the meantime she states that she is sleeping better, no longer has nocturia and why she still has aches and pains her sleep has become more sound and less fragmented. She endorsed no the Epworth sleepiness score at 13 points and the fatigue severity score at 55 points. I'm also able today to see the compliance with CPAP, is 90% compliance bedtime EPR  is 3 cm on a 6 cm setting, this is a very low pressure but her AHI has been reduced to 2.6 and she rarely has air leaks. Given the clinical benefits, the patient should continue CPAP at exactly the same settings she's on now.  Bedtime is 9-10 PM, she rises at 6 AM.  She lost weight without dieting.   HPI:  Linda Buckley is a female, afro -[place], right handed 64 y.o. female , seen here as a referral  from Dr. Jannifer Franklin for a sleep evaluation and possible sleep study,  " I wake up -  because of my body hurting " Linda Buckley reports that she his snoring, wheezing, weight gain, fatigue, swelling in the lower extremities, a tendency to bruise easily, feels excessively sleepy, has restless legs, not enough sleep depression and anxiety, change in that site, is interested in some activities that used to give her pleasure, slurring of speech headaches confusion. She endorsed migraine depression anxiety and fibromyalgia in her past medical history as well as back surgeries she had a fusion anterior fusion cervical spine 12/04/2015, carpal tunnel, hysterectomy.  He was first told couple of years ago that she snored and was surprised. In the meantime her snoring has much increased in volume but this is one of the reasons why she is here.  Sleep habits are as follows:She goes to bed usually at 9:30 but watches TV in bed, usually watches TV news and roughly by 10:30 he will be asleep. She will switch the TV off and the bedroom will be call, quiet and dark. She is very restless and has no favorite sleep position, she also has a lot of discomfort in various positions. She uses 2 pillows between the legs, one under the right arm and 1 for neck and head support. About 1 AM she will wake and feel discomfort, not the urge to urinate. She rises in the morning at 6:45 AM, and still feels fatigued. She often has to do some stretching sitting for while. She may get 6 hours of sleep at the most overnight.   Sleep medical  history and family sleep history:  Brother has insomnia, PTSD. OSA.   Social history: Linda Buckley was married to a [name] service member and stationed in various continence and countries. She also attended [employer]. The patient has never used tobacco, she seldomly drinks wine, no hard liquor. She drinks coffee daily and she takes one cup in the morning and one cup at night, she reports she experiences a paradox  reaction to caffeine  "Dr Jannifer Franklin patient - last note: Ms. Buckley is a 64 year old right-handed black female with a history of fibromyalgia, and a prior history of cervical spine surgery. The patient has ongoing discomfort at the base of the neck and shoulders. The patient has significant neuromuscular pain throughout. She is having exercise intolerance, she has difficulty walking, she may feel as if her knees will collapse. The patient may have sharp pains on the top  and bottom of the feet, she has had a mild peripheral neuropathy documented by nerve conduction studies. She has had carpal tunnel syndrome previously. She gets somewhat short winded when she tries to walk. She reports a lot of excessive daytime drowsiness, she is unable to drive longer distances because she gets so drowsy, she will fall asleep if she is inactive. She has significant foot pain with walking. She is on Lyrica and Cymbalta. These medications are not controlling her symptoms. She has had neuromuscular pain for over 5 years."   Review of Systems: Out of a complete 14 system review, the patient complains of only the following symptoms, and all other reviewed systems are negative.  How likely are you to doze in the following situations: 0 = not likely, 1 = slight chance, 2 = moderate chance, 3 = high chance  Sitting and Reading? Watching Television? Sitting inactive in a public place (theater or meeting)? Lying down in the afternoon when circumstances permit? Sitting and talking to someone? Sitting quietly  after lunch without alcohol? In a car, while stopped for a few minutes in traffic? As a passenger in a car for an hour without a break?  Total = 15 / 24, several days of low compliance , CPAP use dropped off after her trazodone ran out.   She has not called prescriber for refills.  Fatigue severity score endorsed at 55 from 63 points since on CPAP , depression score endorsed at 3/15.    Social History   Socioeconomic History  . Marital status: Married    Spouse name: Not on file  . Number of children: 2  . Years of education: 2 yrs college  . Highest education level: Not on file  Occupational History  . 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 Smoking status: Never Smoker  . Smokeless tobacco: Never Used  Substance and Sexual Activity  . Alcohol use: Not Currently    Comment: 2 glasses of wine weekly - reports none in 2 years  . Drug use: No    Comment: prescription for klonopin and oxy IR  . Sexual activity: Yes    Partners: Male    Birth control/protection: None    Comment: partial hysterectomy  Other Topics Concern  . Not on file  Social History Narrative   Lives at home w/ her husband   Right-handed   Drinks 2 cups of coffee weekly    3 bottle water per day   Social Determinants of Health   Financial Resource Strain:   . Difficulty of Paying Living Expenses: Not on file  Food Insecurity:   . Worried About Charity fundraiser in the Last Year: Not on file  . Ran Out of Food in the Last Year: Not on file  Transportation Needs:   . Lack of Transportation (Medical): Not on file  . Lack of Transportation (Non-Medical): Not on file  Physical Activity:   . Days of Exercise per Week: Not on file  . Minutes of Exercise per Session: Not on file  Stress:   . Feeling of Stress : Not on file  Social Connections:   . Frequency of Communication with Friends and Family: Not on file  . Frequency of Social Gatherings with Friends and Family: Not on  file  . Attends Religious Services: Not on file  . Active Member of Clubs or Organizations: Not on file  . Attends Archivist Meetings: Not on file  .  Marital Status: Not on file  Intimate Partner Violence:   . Fear of Current or Ex-Partner: Not on file  . Emotionally Abused: Not on file  . Physically Abused: Not on file  . Sexually Abused: Not on file    Family History  Problem Relation Age of Onset  . Asthma Father   . Diabetes Father   . Arthritis Mother   . Hypertension Mother   . Diabetes Mother   . Cancer Mother   . Stomach cancer Mother   . Diabetes Brother   . Diabetes Brother   . Asthma Son   . Alcohol abuse Son   . Anxiety disorder Son   . Schizophrenia Son   . Cancer Maternal Grandfather        colon  . Colon cancer Maternal Grandfather   . Cancer Maternal Aunt        breast  . Cancer Maternal Uncle        lung  . Colon cancer Maternal Uncle   . Cancer Maternal Grandmother        ovarian  . Diabetes Son     Past Medical History:  Diagnosis Date  . Allergy   . Anxiety   . Arthritis   . Carpal tunnel syndrome of right wrist    RECURRENT  . Depression   . Dysphagia 09/02/2017  . Edema 09/02/2017  . Environmental allergies    dust mites, pollen, roaches and other insects, mold  . Fibromyalgia 04/2016  . Headache    migraines  . Hereditary and idiopathic peripheral neuropathy 04/03/2016  . History of adenomatous polyp of colon   . History of gastric ulcer   . History of kidney stones   . Hypertension   . Pneumonia   . Sleep apnea    wears CPAP  . Wears glasses     Past Surgical History:  Procedure Laterality Date  . CARPAL TUNNEL RELEASE Right 2007  . CARPAL TUNNEL RELEASE Right 07/05/2015   Procedure: RIGHT HAND REVISION CARPAL TUNNEL RELEASE;  Surgeon: Iran Planas, MD;  Location: [place];  Service: Orthopedics;  Laterality: Right;  . CARPAL TUNNEL RELEASE Left 09/2016  . CERVICAL DISC ARTHROPLASTY  03/2015  . COLONOSCOPY W/  POLYPECTOMY  04-15-2014  . CYSTO/  RIGHT RETROGRADE PYELOGRAM/ URETEROSCOPY STONE EXTRACTION/  STENT PLACEMENT  06-07-2010  . FINGER ARTHRODESIS Right 08/10/2018   Procedure: RIGHT THUMB ARTHRODESIS;  Surgeon: Milly Jakob, MD;  Location: [place];  Service: Orthopedics;  Laterality: Right;  . LAPAROSCOPIC ASSISTED VAGINAL HYSTERECTOMY  04-25-2004   ovaries remain  . LAPAROSCOPIC GASTRIC BAND REMOVAL WITH LAPAROSCOPIC GASTRIC SLEEVE RESECTION  10/26/2018  . TUBAL LIGATION  1982    Current Outpatient Medications  Medication Sig Dispense Refill  . acetaminophen-codeine (TYLENOL #3) 300-30 MG tablet Take 1 tablet by mouth 3 (three) times daily as needed for moderate pain. 30 tablet 0  . buPROPion (WELLBUTRIN XL) 150 MG 24 hr tablet TAKE 1 TABLET(150 MG) BY MOUTH DAILY 90 tablet 1  . buPROPion (WELLBUTRIN XL) 300 MG 24 hr tablet TAKE 1 TABLET(300 MG) BY MOUTH EVERY MORNING 90 tablet 1  . cetirizine (ZYRTEC) 10 MG tablet TAKE 1 TABLET(10 MG) BY MOUTH DAILY 30 tablet 0  . Cholecalciferol (VITAMIN D PO) Take 1 tablet by mouth daily. 1000units    . clonazePAM (KLONOPIN) 0.5 MG tablet TAKE 1 TABLET(0.5 MG) BY MOUTH AT BEDTIME 30 tablet 3  . diclofenac sodium (VOLTAREN) 1 % GEL APPLY 4 GRAMS EXTERNALLY TO  THE AFFECTED AREA FOUR TIMES DAILY 100 g 3  . DULoxetine (CYMBALTA) 60 MG capsule Take 1 capsule (60 mg total) by mouth 2 (two) times daily. 180 capsule 2  . methylPREDNISolone (MEDROL DOSEPAK) 4 MG TBPK tablet Take following package directions 21 tablet 0  . Multiple Vitamin (MULTIVITAMIN) capsule Take 1 capsule by mouth daily. With iron    . naproxen (NAPROSYN) 500 MG tablet TAKE 1 TABLET(500 MG) BY MOUTH TWICE DAILY WITH A MEAL 60 tablet 0  . pregabalin (LYRICA) 100 MG capsule One capsule in the morning and 2 in the evening 270 capsule 1  . QVAR REDIHALER 80 MCG/ACT inhaler INHALE 1 PUFF BY MOUTH TWICE DAILY 10.6 g 0  . traZODone (DESYREL) 50 MG tablet TAKE 1/2 TO 1 TABLET(25 TO  50 MG) BY MOUTH AT BEDTIME AS NEEDED FOR SLEEP 90 tablet 0  . ursodiol (ACTIGALL) 300 MG capsule TK 1 C PO BID WF BEGINNING 14 DAYS AFTER SURGERY    . VENTOLIN HFA 108 (90 Base) MCG/ACT inhaler INHALE 2 PUFFS BY MOUTH EVERY 4 HOURS AS NEEDED FOR SHORTNESS OF BREATH 36 g 0  . vitamin E 1000 UNIT capsule Take 2,000 Units by mouth.      No current facility-administered medications for this visit.    Allergies as of 02/01/2020 - Review Complete 02/01/2020  Allergen Reaction Noted  . Aspirin Swelling 10/11/2013  . Bee venom Swelling 07/04/2015    Vitals: BP 122/86   Pulse 77   Temp (!) 96.9 F (36.1 C)   Ht 5\' 7"  (1.702 m)   Wt 196 lb (88.9 kg)   BMI 30.70 kg/m  Last Weight:  Wt Readings from Last 1 Encounters:  02/01/20 196 lb (88.9 kg)   [id number] mass index is 30.7 kg/m.     Last Height:   Ht Readings from Last 1 Encounters:  02/01/20 5\' 7"  (1.702 m)    Physical exam: no loss of smell or taste.   General: The patient is awake, alert and appears not in acute distress. The patient is well groomed. Head: Normocephalic, atraumatic. Neck is supple. Mallampati 3 ,  neck circumference:14. Nasal airflow patent , TMJ is evident . Retrognathia is seen.  Cardiovascular:  Regular rate and rhythm, Respiratory: Lungs are clear to auscultation.  Neurologic exam : The patient is awake and alert, oriented to place and time.   Memory subjective  described as intact.   Attention span & concentration ability appears normal.  Speech is fluent,  Without dysarthria, dysphonia or aphasia.  Mood and affect are appropriate.  Cranial nerves: Pupils are equal and briskly reactive to light. Facial motor strength is symmetric and tongue and uvula move midline. Shoulder shrug was symmetrical.  Motor exam:  Normal tone, muscle bulk and symmetric strength in all extremities.  The patient was advised of the nature of the diagnosed sleep disorder, the treatment options and risks for general a health and  wellness arising from not treating the condition.  I spent more than 20 minutes of face to face time with the patient.  She understood that she has been non- compliant and that her own actions of not refilling trazodone are a contributing cause.   Greater than 50% of time was spent in counseling and coordination of care. We have discussed the diagnosis and differential and I answered the patient's questions.    Assessment:  After physical and neurologic examination, review of laboratory studies,  Personal review of imaging studies, reports of other /same  Imaging studies ,  Results of polysomnography/ neurophysiology testing and pre-existing records as far as provided in visit., my assessment is   1) I suspect strongly that the patient's underlying pain disorder continues to interfere with her sleep.  This is confirmed by her reports, and caused the recent non -compliance issues.  Pain will be addressed by PCP or primary Neurologist, not the sleep clinic. PS: She had benefited from using CPAP, is less sleepy in daytime, and was a compliant user until 12-2019.  She will have a yearly visit with her MD /NP.     Asencion Partridge Jessie Cowher MD  02/01/2020   CC: Floyde Parkins MD and Ward Givens , NP    Midge Minium, Md [address] [name] [address],  [place] [address]

## 2020-02-01 NOTE — Telephone Encounter (Signed)
Last OV 09/27/19 Clonazepam last filled 08/18/19 #30 with 3  Pt is requesting this to be filled to mail order.

## 2020-02-02 ENCOUNTER — Other Ambulatory Visit: Payer: Self-pay | Admitting: *Deleted

## 2020-02-02 ENCOUNTER — Other Ambulatory Visit: Payer: Self-pay

## 2020-02-02 ENCOUNTER — Ambulatory Visit (INDEPENDENT_AMBULATORY_CARE_PROVIDER_SITE_OTHER): Payer: Medicare Other | Admitting: Family Medicine

## 2020-02-02 ENCOUNTER — Encounter: Payer: Self-pay | Admitting: Family Medicine

## 2020-02-02 VITALS — BP 130/86 | HR 72 | Temp 97.6°F | Resp 16 | Ht 66.0 in | Wt 197.5 lb

## 2020-02-02 DIAGNOSIS — E669 Obesity, unspecified: Secondary | ICD-10-CM | POA: Diagnosis not present

## 2020-02-02 DIAGNOSIS — F331 Major depressive disorder, recurrent, moderate: Secondary | ICD-10-CM

## 2020-02-02 DIAGNOSIS — F411 Generalized anxiety disorder: Secondary | ICD-10-CM

## 2020-02-02 DIAGNOSIS — E2839 Other primary ovarian failure: Secondary | ICD-10-CM

## 2020-02-02 DIAGNOSIS — I10 Essential (primary) hypertension: Secondary | ICD-10-CM

## 2020-02-02 DIAGNOSIS — Z0000 Encounter for general adult medical examination without abnormal findings: Secondary | ICD-10-CM | POA: Diagnosis not present

## 2020-02-02 DIAGNOSIS — E559 Vitamin D deficiency, unspecified: Secondary | ICD-10-CM

## 2020-02-02 DIAGNOSIS — Z1231 Encounter for screening mammogram for malignant neoplasm of breast: Secondary | ICD-10-CM

## 2020-02-02 LAB — CBC WITH DIFFERENTIAL/PLATELET
Basophils Absolute: 0.1 10*3/uL (ref 0.0–0.1)
Basophils Relative: 1.3 % (ref 0.0–3.0)
Eosinophils Absolute: 0.1 10*3/uL (ref 0.0–0.7)
Eosinophils Relative: 2.2 % (ref 0.0–5.0)
HCT: 39.4 % (ref 36.0–46.0)
Hemoglobin: 12.9 g/dL (ref 12.0–15.0)
Lymphocytes Relative: 24.8 % (ref 12.0–46.0)
Lymphs Abs: 1 10*3/uL (ref 0.7–4.0)
MCHC: 32.6 g/dL (ref 30.0–36.0)
MCV: 91.7 fl (ref 78.0–100.0)
Monocytes Absolute: 0.3 10*3/uL (ref 0.1–1.0)
Monocytes Relative: 8.1 % (ref 3.0–12.0)
Neutro Abs: 2.4 10*3/uL (ref 1.4–7.7)
Neutrophils Relative %: 63.6 % (ref 43.0–77.0)
Platelets: 241 10*3/uL (ref 150.0–400.0)
RBC: 4.29 Mil/uL (ref 3.87–5.11)
RDW: 12.8 % (ref 11.5–15.5)
WBC: 3.8 10*3/uL — ABNORMAL LOW (ref 4.0–10.5)

## 2020-02-02 LAB — BASIC METABOLIC PANEL
BUN: 15 mg/dL (ref 6–23)
CO2: 31 mEq/L (ref 19–32)
Calcium: 9.8 mg/dL (ref 8.4–10.5)
Chloride: 105 mEq/L (ref 96–112)
Creatinine, Ser: 0.8 mg/dL (ref 0.40–1.20)
GFR: 88.14 mL/min (ref >60.00)
Glucose, Bld: 86 mg/dL (ref 70–99)
Potassium: 4.4 mEq/L (ref 3.5–5.1)
Sodium: 142 mEq/L (ref 135–145)

## 2020-02-02 LAB — HEPATIC FUNCTION PANEL
ALT: 10 U/L (ref 0–35)
AST: 14 U/L (ref 0–37)
Albumin: 4 g/dL (ref 3.5–5.2)
Alkaline Phosphatase: 62 U/L (ref 39–117)
Bilirubin, Direct: 0.1 mg/dL (ref 0.0–0.3)
Total Bilirubin: 0.5 mg/dL (ref 0.2–1.2)
Total Protein: 6.4 g/dL (ref 6.0–8.3)

## 2020-02-02 LAB — LIPID PANEL
Cholesterol: 187 mg/dL (ref 0–200)
HDL: 67.7 mg/dL (ref >39.00)
LDL Cholesterol: 103 mg/dL — ABNORMAL HIGH (ref 0–99)
NonHDL: 119.64
Total CHOL/HDL Ratio: 3
Triglycerides: 81 mg/dL (ref 0.0–149.0)
VLDL: 16.2 mg/dL (ref 0.0–40.0)

## 2020-02-02 LAB — TSH: TSH: 1.07 u[IU]/mL (ref 0.35–4.50)

## 2020-02-02 LAB — VITAMIN D 25 HYDROXY (VIT D DEFICIENCY, FRACTURES): VITD: 38.7 ng/mL (ref 30.00–100.00)

## 2020-02-02 MED ORDER — BUPROPION HCL ER (XL) 300 MG PO TB24: ORAL_TABLET | ORAL | 1 refills | Status: DC

## 2020-02-02 MED ORDER — CLONAZEPAM 0.5 MG PO TABS: ORAL_TABLET | ORAL | 1 refills | Status: DC

## 2020-02-02 MED ORDER — DULOXETINE HCL 60 MG PO CPEP: 60.0000 mg | ORAL_CAPSULE | Freq: Two times a day (BID) | ORAL | 2 refills | Status: DC

## 2020-02-02 MED ORDER — BUPROPION HCL ER (XL) 150 MG PO TB24: ORAL_TABLET | ORAL | 1 refills | Status: DC

## 2020-02-02 NOTE — Assessment & Plan Note (Signed)
Chronic problem, adequate control w/o medication at this time.  Will continue to follow.

## 2020-02-02 NOTE — Progress Notes (Signed)
Subjective:    Patient ID: Linda Buckley, female    DOB: Jul 24, 1956, 64 y.o.   MRN: [id number]  HPI Here today for CPE and MWV.  Risk Factors: HTN- chronic problem, currently able to control w/o medication. OSA- ongoing issue, hx of poor compliance w/ CPAP.  Now using CPAP regularly. Obesity- pt has gained 10 lbs since last visit Physical Activity: walking daily on elliptical Fall Risk: moderate Depression: currently being treated Hearing: normal to conversational tones and whispered voice ADL's: independent Cognitive: normal linear thought process, memory and attention intact Home Safety: safe at home, lives w/ husband Height, Weight, BMI, Visual Acuity: see vitals, vision corrected to 20/20 w/ glasses Counseling: Due for mammo.  Colonoscopy date was actually updated to May 2022.  UTD on flu shot, Tdap.  Plans to get COVID vaccine Labs Ordered: See A&P Care Plan: See A&P   Health Maintenance  Topic Date Due  . DEXA SCAN  1956/10/13  . COLONOSCOPY  04/16/2019  . MAMMOGRAM  06/04/2019  . Hepatitis C Screening  02/01/2021 (Originally 1956/09/17)  . TETANUS/TDAP  11/06/2027  . INFLUENZA VACCINE  Completed  . HIV Screening  Completed     Patient Care Team    Relationship Specialty Notifications Start End  Midge Minium, MD PCP - General Family Medicine  01/12/14   Iran Planas, MD Consulting Physician Orthopedic Surgery  10/23/15   Suella Broad, MD Consulting Physician Physical Medicine and Rehabilitation  10/23/15   Melina Schools, MD Consulting Physician Orthopedic Surgery  04/03/16   Milus Banister, MD Attending Physician Gastroenterology  11/05/17   Kathrynn Ducking, MD Consulting Physician Neurology  11/05/17   Erma Heritage, PA-C Physician Assistant Physician Assistant  11/05/17   Ward Givens, NP Registered Nurse Gerontology  11/05/17   Lorretta Harp, PA-C Physician Assistant Orthopedic Surgery  12/01/17       Review of Systems Patient reports  no vision/ hearing changes, adenopathy,fever, weight change,  persistant/recurrent hoarseness , swallowing issues, chest pain, palpitations, edema, persistant/recurrent cough, hemoptysis, dyspnea (rest/exertional/paroxysmal nocturnal), gastrointestinal bleeding (melena, rectal bleeding), abdominal pain, significant heartburn, bowel changes, GU symptoms (dysuria, hematuria, incontinence), Gyn symptoms (abnormal  bleeding, pain),  syncope, focal weakness, memory loss, numbness & tingling, skin/hair/nail changes, abnormal bruising or bleeding, anxiety, or depression.   This visit occurred during the SARS-CoV-2 public health emergency.  Safety protocols were in place, including screening questions prior to the visit, additional usage of staff PPE, and extensive cleaning of exam room while observing appropriate contact time as indicated for disinfecting solutions.       Objective:   Physical Exam General Appearance:    Alert, cooperative, no distress, appears stated age  Head:    Normocephalic, without obvious abnormality, atraumatic  Eyes:    PERRL, conjunctiva/corneas clear, EOM's intact, fundi    benign, both eyes  Ears:    Normal TM's and external ear canals, both ears  Nose:   Deferred due to COVID  Throat:   Neck:   Supple, symmetrical, trachea midline, no adenopathy;    Thyroid: no enlargement/tenderness/nodules  Back:     Symmetric, no curvature, ROM normal, no CVA tenderness  Lungs:     Clear to auscultation bilaterally, respirations unlabored  Chest Wall:    No tenderness or deformity   Heart:    Regular rate and rhythm, S1 and S2 normal, no murmur, rub   or gallop  Breast Exam:    Deferred to mammo  Abdomen:  Soft, non-tender, bowel sounds active all four quadrants,    no masses, no organomegaly  Genitalia:    Deferred  Rectal:    Extremities:   Extremities normal, atraumatic, no cyanosis or edema  Pulses:   2+ and symmetric all extremities  Skin:   Skin color, texture, turgor  normal, no rashes or lesions  Lymph nodes:   Cervical, supraclavicular, and axillary nodes normal  Neurologic:   CNII-XII intact, normal strength, sensation and reflexes    throughout          Assessment & Plan:

## 2020-02-02 NOTE — Assessment & Plan Note (Signed)
Check labs and replete prn. 

## 2020-02-02 NOTE — Assessment & Plan Note (Signed)
Ongoing issue for pt.  Pt has gained 10 lbs since last visit.  Stressed need for healthy diet and regular exercise.  Check labs to risk stratify.  Will follow.

## 2020-02-02 NOTE — Assessment & Plan Note (Signed)
Pt's PE WNL w/ exception of obesity.  UTD on colonoscopy, immunizations.  Due for mammo (ordered) and DEXA (ordered)  Check labs.  Discussed COVID safety and vaccines.

## 2020-02-02 NOTE — Patient Instructions (Addendum)
Follow up in 1 year or as needed We'll notify you of your lab results and make any changes if needed Continue to work on healthy diet and regular exercise- you can do it! Schedule your COVID vaccine when you are able We'll hold on the shingles shot for now They should call you with your mammogram appt Call with any questions or concerns Stay Safe!  Stay Healthy!   Preventive Care 62-62 Years Old, Female Preventive care refers to visits with your health care provider and lifestyle choices that can promote health and wellness. This includes:  A yearly physical exam. This may also be called an annual well check.  Regular dental visits and eye exams.  Immunizations.  Screening for certain conditions.  Healthy lifestyle choices, such as eating a healthy diet, getting regular exercise, not using drugs or products that contain nicotine and tobacco, and limiting alcohol use. What can I expect for my preventive care visit? Physical exam Your health care provider will check your:  Height and weight. This may be used to calculate body mass index (BMI), which tells if you are at a healthy weight.  Heart rate and blood pressure.  Skin for abnormal spots. Counseling Your health care provider may ask you questions about your:  Alcohol, tobacco, and drug use.  Emotional well-being.  Home and relationship well-being.  Sexual activity.  Eating habits.  Work and work Statistician.  Method of birth control.  Menstrual cycle.  Pregnancy history. What immunizations do I need?  Influenza (flu) vaccine  This is recommended every year. Tetanus, diphtheria, and pertussis (Tdap) vaccine  You may need a Td booster every 10 years. Varicella (chickenpox) vaccine  You may need this if you have not been vaccinated. Zoster (shingles) vaccine  You may need this after age 39. Measles, mumps, and rubella (MMR) vaccine  You may need at least one dose of MMR if you were born in 1957 or  later. You may also need a second dose. Pneumococcal conjugate (PCV13) vaccine  You may need this if you have certain conditions and were not previously vaccinated. Pneumococcal polysaccharide (PPSV23) vaccine  You may need one or two doses if you smoke cigarettes or if you have certain conditions. Meningococcal conjugate (MenACWY) vaccine  You may need this if you have certain conditions. Hepatitis A vaccine  You may need this if you have certain conditions or if you travel or work in places where you may be exposed to hepatitis A. Hepatitis B vaccine  You may need this if you have certain conditions or if you travel or work in places where you may be exposed to hepatitis B. Haemophilus influenzae type b (Hib) vaccine  You may need this if you have certain conditions. Human papillomavirus (HPV) vaccine  If recommended by your health care provider, you may need three doses over 6 months. You may receive vaccines as individual doses or as more than one vaccine together in one shot (combination vaccines). Talk with your health care provider about the risks and benefits of combination vaccines. What tests do I need? Blood tests  Lipid and cholesterol levels. These may be checked every 5 years, or more frequently if you are over 64 years old.  Hepatitis C test.  Hepatitis B test. Screening  Lung cancer screening. You may have this screening every year starting at age 82 if you have a 30-pack-year history of smoking and currently smoke or have quit within the past 15 years.  Colorectal cancer screening. All adults  should have this screening starting at age 79 and continuing until age 30. Your health care provider may recommend screening at age 19 if you are at increased risk. You will have tests every 1-10 years, depending on your results and the type of screening test.  Diabetes screening. This is done by checking your blood sugar (glucose) after you have not eaten for a while  (fasting). You may have this done every 1-3 years.  Mammogram. This may be done every 1-2 years. Talk with your health care provider about when you should start having regular mammograms. This may depend on whether you have a family history of breast cancer.  BRCA-related cancer screening. This may be done if you have a family history of breast, ovarian, tubal, or peritoneal cancers.  Pelvic exam and Pap test. This may be done every 3 years starting at age 45. Starting at age 3, this may be done every 5 years if you have a Pap test in combination with an HPV test. Other tests  Sexually transmitted disease (STD) testing.  Bone density scan. This is done to screen for osteoporosis. You may have this scan if you are at high risk for osteoporosis. Follow these instructions at home: Eating and drinking  Eat a diet that includes fresh fruits and vegetables, whole grains, lean protein, and low-fat dairy.  Take vitamin and mineral supplements as recommended by your health care provider.  Do not drink alcohol if: ? Your health care provider tells you not to drink. ? You are pregnant, may be pregnant, or are planning to become pregnant.  If you drink alcohol: ? Limit how much you have to 0-1 drink a day. ? Be aware of how much alcohol is in your drink. In the [place] one drink equals one 12 oz bottle of beer (355 mL), one 5 oz glass of wine (148 mL), or one 1 oz glass of hard liquor (44 mL). Lifestyle  Take daily care of your teeth and gums.  Stay active. Exercise for at least 30 minutes on 5 or more days each week.  Do not use any products that contain nicotine or tobacco, such as cigarettes, e-cigarettes, and chewing tobacco. If you need help quitting, ask your health care provider.  If you are sexually active, practice safe sex. Use a condom or other form of birth control (contraception) in order to prevent pregnancy and STIs (sexually transmitted infections).  If told by your health  care provider, take low-dose aspirin daily starting at age 56. What's next?  Visit your health care provider once a year for a well check visit.  Ask your health care provider how often you should have your eyes and teeth checked.  Stay up to date on all vaccines. This information is not intended to replace advice given to you by your health care provider. Make sure you discuss any questions you have with your health care provider. Document Revised: 08/13/2018 Document Reviewed: 08/13/2018 Elsevier Patient Education  2020 [employer].

## 2020-02-02 NOTE — Telephone Encounter (Signed)
Done during OV today

## 2020-02-07 DIAGNOSIS — M5416 Radiculopathy, lumbar region: Secondary | ICD-10-CM | POA: Diagnosis not present

## 2020-02-23 NOTE — Progress Notes (Signed)
PATIENT: Linda Buckley DOB: Sep 13, 1956  REASON FOR VISIT: follow up HISTORY FROM: patient  HISTORY OF PRESENT ILLNESS: Today 02/24/20  Linda Buckley is a 64 year old female with history of fibromyalgia, sleep apnea on CPAP, and memory disturbance.  She goes to the [facility], receives injections.  She has a spinal stimulator in place no longer offers benefit.  She remains on Lyrica and Cymbalta from this office.  She has chronic pain in her arms and legs.  She is planning a cervical epidural steroid injection in a week or so.  Today, is a bad day, has pain to the left shoulder, down the arm.  She thinks she needs an increase in the Lyrica, go back up to 200 mg twice a day, was decreased before due to side effect of drowsy, staggery .  With memory, feels she is forgetful, has word finding difficulty at times, may forget people's names, pain impacts her memory.  She lives with her husband.  She is using her CPAP.  She is using a cane, had 1 fall couple weeks ago, her legs gave out she presents today for evaluation unaccompanied.  HISTORY 08/19/2019 SS: Linda Buckley is a 63 year old female with history of fibromyalgia, sleep apnea on CPAP, and memory disturbance.  She has been followed through pain center in the past, and she has a spine stimulator in place but no longer offers any benefit.  After last visit her dose of Lyrica was reduced to 100 mg in the morning, 200 mg in the evening, due to report of side effects.  Laboratory evaluation at last visit was unremarkable for evaluation of memory trouble.  She is given Lyrica and Cymbalta through this office.  She reports continued pain in her arms and legs that is chronic.  She has actually only been taking Lyrica 100 mg in the morning, 100 mg in the evening.  She continues to report some trouble with memory and speech stuttering.  She lives with her husband, she is able to drive a car.  She is able to perform all her daily activities at  home.  She is on disability.  She is currently being treated by her primary care doctor for right trochanteric bursitis.  She does report 1 month ago she had a fall, due to loss of balance.  She continues to do a daily exercise program consisting of 30 minutes daily. She presents today for follow-up unaccompanied.  REVIEW OF SYSTEMS: Out of a complete 14 system review of symptoms, the patient complains only of the following symptoms, and all other reviewed systems are negative.  Chronic pain  ALLERGIES: Allergies  Allergen Reactions  . Aspirin Swelling    Sweating/ swelling of hands and face   . Bee Venom Swelling    Swelling at site     HOME MEDICATIONS: Outpatient Medications Prior to Visit  Medication Sig Dispense Refill  . acetaminophen-codeine (TYLENOL #3) 300-30 MG tablet Take 1 tablet by mouth 3 (three) times daily as needed for moderate pain. 30 tablet 0  . buPROPion (WELLBUTRIN XL) 150 MG 24 hr tablet TAKE 1 TABLET(150 MG) BY MOUTH DAILY 90 tablet 1  . buPROPion (WELLBUTRIN XL) 300 MG 24 hr tablet TAKE 1 TABLET(300 MG) BY MOUTH EVERY MORNING 90 tablet 1  . cetirizine (ZYRTEC) 10 MG tablet TAKE 1 TABLET(10 MG) BY MOUTH DAILY 30 tablet 0  . Cholecalciferol (VITAMIN D PO) Take 1 tablet by mouth daily. 1000units    . clonazePAM (KLONOPIN) 0.5  MG tablet TAKE 1 TABLET(0.5 MG) BY MOUTH AT BEDTIME 90 tablet 1  . diclofenac sodium (VOLTAREN) 1 % GEL APPLY 4 GRAMS EXTERNALLY TO THE AFFECTED AREA FOUR TIMES DAILY 100 g 3  . DULoxetine (CYMBALTA) 60 MG capsule Take 1 capsule (60 mg total) by mouth 2 (two) times daily. 180 capsule 2  . Multiple Vitamin (MULTIVITAMIN) capsule Take 1 capsule by mouth daily. With iron    . naproxen (NAPROSYN) 500 MG tablet TAKE 1 TABLET(500 MG) BY MOUTH TWICE DAILY WITH A MEAL 60 tablet 0  . pregabalin (LYRICA) 100 MG capsule One capsule in the morning and 2 in the evening 270 capsule 1  . QVAR REDIHALER 80 MCG/ACT inhaler INHALE 1 PUFF BY MOUTH TWICE DAILY  10.6 g 0  . VENTOLIN HFA 108 (90 Base) MCG/ACT inhaler INHALE 2 PUFFS BY MOUTH EVERY 4 HOURS AS NEEDED FOR SHORTNESS OF BREATH 36 g 0  . vitamin E 1000 UNIT capsule Take 2,000 Units by mouth.     . methylPREDNISolone (MEDROL DOSEPAK) 4 MG TBPK tablet Take following package directions (Patient not taking: Reported on 02/24/2020) 21 tablet 0  . ursodiol (ACTIGALL) 300 MG capsule TK 1 C PO BID WF BEGINNING 14 DAYS AFTER SURGERY     No facility-administered medications prior to visit.    PAST MEDICAL HISTORY: Past Medical History:  Diagnosis Date  . Allergy   . Anxiety   . Arthritis   . Carpal tunnel syndrome of right wrist    RECURRENT  . Depression   . Dysphagia 09/02/2017  . Edema 09/02/2017  . Environmental allergies    dust mites, pollen, roaches and other insects, mold  . Fibromyalgia 04/2016  . Headache    migraines  . Hereditary and idiopathic peripheral neuropathy 04/03/2016  . History of adenomatous polyp of colon   . History of gastric ulcer   . History of kidney stones   . Hypertension   . Pneumonia   . Sleep apnea    wears CPAP  . Wears glasses     PAST SURGICAL HISTORY: Past Surgical History:  Procedure Laterality Date  . CARPAL TUNNEL RELEASE Right 2007  . CARPAL TUNNEL RELEASE Right 07/05/2015   Procedure: RIGHT HAND REVISION CARPAL TUNNEL RELEASE;  Surgeon: Iran Planas, MD;  Location: [facility];  Service: Orthopedics;  Laterality: Right;  . CARPAL TUNNEL RELEASE Left 09/2016  . CERVICAL DISC ARTHROPLASTY  03/2015  . COLONOSCOPY W/ POLYPECTOMY  04-15-2014  . CYSTO/  RIGHT RETROGRADE PYELOGRAM/ URETEROSCOPY STONE EXTRACTION/  STENT PLACEMENT  06-07-2010  . FINGER ARTHRODESIS Right 08/10/2018   Procedure: RIGHT THUMB ARTHRODESIS;  Surgeon: Milly Jakob, MD;  Location: [facility];  Service: Orthopedics;  Laterality: Right;  . LAPAROSCOPIC ASSISTED VAGINAL HYSTERECTOMY  04-25-2004   ovaries remain  . LAPAROSCOPIC GASTRIC BAND REMOVAL WITH LAPAROSCOPIC  GASTRIC SLEEVE RESECTION  10/26/2018  . TUBAL LIGATION  1982    FAMILY HISTORY: Family History  Problem Relation Age of Onset  . Asthma Father   . Diabetes Father   . Arthritis Mother   . Hypertension Mother   . Diabetes Mother   . Cancer Mother   . Stomach cancer Mother   . Diabetes Brother   . Diabetes Brother   . Asthma Son   . Alcohol abuse Son   . Anxiety disorder Son   . Schizophrenia Son   . Cancer Maternal Grandfather        colon  . Colon cancer Maternal Grandfather   .  Cancer Maternal Aunt        breast  . Cancer Maternal Uncle        lung  . Colon cancer Maternal Uncle   . Cancer Maternal Grandmother        ovarian  . Diabetes Son     SOCIAL HISTORY: Social History   Socioeconomic History  . Marital status: Married    Spouse name: Not on file  . Number of children: 2  . Years of education: 2 yrs college  . Highest education level: Not on file  Occupational History  . 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 Smoking status: Never Smoker  . Smokeless tobacco: Never Used  Substance and Sexual Activity  . Alcohol use: Not Currently    Comment: 2 glasses of wine weekly - reports none in 2 years  . Drug use: No    Comment: prescription for klonopin and oxy IR  . Sexual activity: Yes    Partners: Male    Birth control/protection: None    Comment: partial hysterectomy  Other Topics Concern  . Not on file  Social History Narrative   Lives at home w/ her husband   Right-handed   Drinks 2 cups of coffee weekly    3 bottle water per day   Social Determinants of Health   Financial Resource Strain:   . Difficulty of Paying Living Expenses:   Food Insecurity:   . Worried About Charity fundraiser in the Last Year:   . Arboriculturist in the Last Year:   Transportation Needs:   . Film/video editor (Medical):   Marland Kitchen Lack of Transportation (Non-Medical):   Physical Activity:   . Days of Exercise per Week:   . Minutes of  Exercise per Session:   Stress:   . Feeling of Stress :   Social Connections:   . Frequency of Communication with Friends and Family:   . Frequency of Social Gatherings with Friends and Family:   . Attends Religious Services:   . Active Member of Clubs or Organizations:   . Attends Archivist Meetings:   Marland Kitchen Marital Status:   Intimate Partner Violence:   . Fear of Current or Ex-Partner:   . Emotionally Abused:   Marland Kitchen Physically Abused:   . Sexually Abused:    PHYSICAL EXAM  Vitals:   02/24/20 0819  BP: 128/74  Pulse: 76  Temp: (!) 97.1 F (36.2 C)  Weight: 201 lb (91.2 kg)  Height: 5\' 6"  (1.676 m)   Body mass index is 32.44 kg/m.  Generalized: Well developed, in no acute distress  MMSE - Mini Mental State Exam 02/24/2020 02/01/2019  Orientation to time 5 5  Orientation to Place 5 5  Registration 3 3  Attention/ Calculation 1 5  Recall 1 2  Language- name 2 objects 2 2  Language- repeat 1 0  Language- follow 3 step command 3 3  Language- read & follow direction 1 1  Write a sentence 1 1  Copy design 1 1  Total score 24 28    Neurological examination  Mentation: Alert oriented to time, place, history taking. Follows all commands speech and language fluent Cranial nerve II-XII: Pupils were equal round reactive to light. Extraocular movements were full, visual field were full on confrontational test. Facial sensation and strength were normal.  Head turning and shoulder shrug were normal and symmetric. Motor: Good strength of all extremities, slowing moving  extremities due to pain Sensory: Sensory testing is intact to soft touch on all 4 extremities. No evidence of extinction is noted.  Coordination: Cerebellar testing reveals good finger-nose-finger and heel-to-shin bilaterally.  Gait and station: Has to push off from seated position to stand, slow to rise, gait is somewhat wide-based, cautious, has a single-point cane Reflexes: Deep tendon reflexes are symmetric     DIAGNOSTIC DATA (LABS, IMAGING, TESTING) - I reviewed patient records, labs, notes, testing and imaging myself where available.  Lab Results  Component Value Date   WBC 3.8 (L) 02/02/2020   HGB 12.9 02/02/2020   HCT 39.4 02/02/2020   MCV 91.7 02/02/2020   PLT 241.0 02/02/2020      Component Value Date/Time   NA 142 02/02/2020 1052   K 4.4 02/02/2020 1052   CL 105 02/02/2020 1052   CO2 31 02/02/2020 1052   GLUCOSE 86 02/02/2020 1052   BUN 15 02/02/2020 1052   CREATININE 0.80 02/02/2020 1052   CREATININE 0.78 04/22/2016 1454   CALCIUM 9.8 02/02/2020 1052   PROT 6.4 02/02/2020 1052   PROT 6.6 04/03/2016 1018   ALBUMIN 4.0 02/02/2020 1052   AST 14 02/02/2020 1052   ALT 10 02/02/2020 1052   ALKPHOS 62 02/02/2020 1052   BILITOT 0.5 02/02/2020 1052   GFRNONAA 54 (L) 08/04/2018 1230   GFRAA >60 08/04/2018 1230   Lab Results  Component Value Date   CHOL 187 02/02/2020   HDL 67.70 02/02/2020   LDLCALC 103 (H) 02/02/2020   TRIG 81.0 02/02/2020   CHOLHDL 3 02/02/2020   Lab Results  Component Value Date   HGBA1C 6.0 11/05/2017   Lab Results  Component Value Date   VITAMINB12 420 02/01/2019   Lab Results  Component Value Date   TSH 1.07 02/02/2020      ASSESSMENT AND PLAN 64 y.o. year old female  has a past medical history of Allergy, Anxiety, Arthritis, Carpal tunnel syndrome of right wrist, Depression, Dysphagia (09/02/2017), Edema (09/02/2017), Environmental allergies, Fibromyalgia (04/2016), Headache, Hereditary and idiopathic peripheral neuropathy (04/03/2016), History of adenomatous polyp of colon, History of gastric ulcer, History of kidney stones, Hypertension, Pneumonia, Sleep apnea, and Wears glasses. here with:  1.  Fibromyalgia 2.  Memory disturbance  She may go back up to higher dose of Lyrica 200 mg twice a day, watch for side effect of drowsiness, gait instability.  She recently refilled her 100 mg capsules, she will use these up (check for side effect  before filling again).  She will remain on Cymbalta 60 mg twice a day.  Memory score was 24/30, felt score was lowered because having pain today.  We discussed, she can likely follow-up with [name] as needed, the [facility], or PCP may refill medications going forward.  I will schedule follow-up 9-month appointment with our office, but she may cancel accordingly.   I spent 15 minutes with the patient. 50% of this time was spent discussing her plan of care.   Butler Denmark, AGNP-C, DNP 02/24/2020, 8:27 AM [facility] [address] [place], [place] [address] [phone]

## 2020-02-24 ENCOUNTER — Encounter: Payer: Self-pay | Admitting: Neurology

## 2020-02-24 ENCOUNTER — Other Ambulatory Visit: Payer: Self-pay

## 2020-02-24 ENCOUNTER — Ambulatory Visit: Payer: Medicare Other | Admitting: Neurology

## 2020-02-24 VITALS — BP 128/74 | HR 76 | Temp 97.1°F | Ht 66.0 in | Wt 201.0 lb

## 2020-02-24 DIAGNOSIS — M797 Fibromyalgia: Secondary | ICD-10-CM

## 2020-02-24 NOTE — Progress Notes (Signed)
I have read the note, and I agree with the clinical assessment and plan.  Gretel Cantu K Devean Skoczylas   

## 2020-02-24 NOTE — Patient Instructions (Addendum)
May try higher dose of Lyrica 200 mg twice daily , continue taking Cymbalta, continue seeing the pain center, check to see if they could fill your medications going forward

## 2020-02-29 DIAGNOSIS — N816 Rectocele: Secondary | ICD-10-CM | POA: Diagnosis not present

## 2020-02-29 DIAGNOSIS — Z1231 Encounter for screening mammogram for malignant neoplasm of breast: Secondary | ICD-10-CM | POA: Diagnosis not present

## 2020-02-29 DIAGNOSIS — K5900 Constipation, unspecified: Secondary | ICD-10-CM | POA: Diagnosis not present

## 2020-02-29 DIAGNOSIS — Z779 Other contact with and (suspected) exposures hazardous to health: Secondary | ICD-10-CM | POA: Diagnosis not present

## 2020-02-29 LAB — HM MAMMOGRAPHY

## 2020-03-01 DIAGNOSIS — M4716 Other spondylosis with myelopathy, lumbar region: Secondary | ICD-10-CM | POA: Diagnosis not present

## 2020-03-02 ENCOUNTER — Encounter: Payer: Self-pay | Admitting: General Practice

## 2020-03-10 ENCOUNTER — Ambulatory Visit: Payer: Medicare Other

## 2020-03-28 ENCOUNTER — Telehealth: Payer: Self-pay | Admitting: Family Medicine

## 2020-03-28 NOTE — Progress Notes (Signed)
  Chronic Care Management   Outreach Note  03/28/2020 Name: Linda Buckley MRN: [id number] DOB: 07/06/1956  Referred by: Midge Minium, MD Reason for referral : No chief complaint on file.   An unsuccessful telephone outreach was attempted today. The patient was referred to the pharmacist for assistance with care management and care coordination.   Follow Up Plan:   Earney Hamburg Upstream Scheduler

## 2020-04-05 ENCOUNTER — Other Ambulatory Visit: Payer: Self-pay

## 2020-04-05 ENCOUNTER — Encounter: Payer: Self-pay | Admitting: Family Medicine

## 2020-04-05 ENCOUNTER — Telehealth (INDEPENDENT_AMBULATORY_CARE_PROVIDER_SITE_OTHER): Payer: Medicare Other | Admitting: Family Medicine

## 2020-04-05 DIAGNOSIS — K5900 Constipation, unspecified: Secondary | ICD-10-CM

## 2020-04-05 DIAGNOSIS — R2232 Localized swelling, mass and lump, left upper limb: Secondary | ICD-10-CM | POA: Diagnosis not present

## 2020-04-05 NOTE — Progress Notes (Signed)
I have discussed the procedure for the virtual visit with the patient who has given consent to proceed with assessment and treatment.   Pt unable to obtain vitals.   Jessica L Brodmerkel, CMA     

## 2020-04-05 NOTE — Progress Notes (Signed)
Virtual Visit via Video   I connected with patient on 04/05/20 at 11:30 AM EDT by a video enabled telemedicine application and verified that I am speaking with the correct person using two identifiers.  Location patient: Home Location provider: Acupuncturist, Office Persons participating in the virtual visit: Patient, Provider, Angie (Jess B)  I discussed the limitations of evaluation and management by telemedicine and the availability of in person appointments. The patient expressed understanding and agreed to proceed.  Subjective:   HPI:   Lump in armpit- 'it was hurting really bad last week but it's gone now'.  L side.  Pt reports this comes and goes.  No obvious cause or trigger.  Pt reports area will 'be so sore I can barely even wash'.  No drainage from the area.  Pt reports 'pea sized' non tender area today.  No pus or drainage.  Improves w/ heat.  Not related to shaving.  Constipation- pt reports irregularity that causes abdominal discomfort depending on how long between BMs.  Pt reports good water intake daily.     ROS:   See pertinent positives and negatives per HPI.  Patient Active Problem List   Diagnosis Date Noted  . Obesity (BMI 30-39.9) 02/02/2020  . Poor compliance with CPAP treatment 02/01/2020  . OSA on CPAP 02/01/2020  . Chronic left shoulder pain 09/17/2019  . MDD (major depressive disorder), recurrent episode, severe ([place]) 05/05/2018  . Vitamin D deficiency 11/05/2017  . Dysphagia 09/02/2017  . Multiple falls 09/04/2016  . Hereditary and idiopathic peripheral neuropathy 04/03/2016  . Exertional shortness of breath 01/26/2016  . Left leg pain 01/26/2016  . Fibromyalgia 07/28/2015  . Right wrist pain 04/05/2015  . Physical exam 03/01/2014  . HTN (hypertension) 01/12/2014  . Pre-diabetes 01/12/2014  . Family history of lupus erythematosus 01/12/2014  . Fatigue 10/17/2013    Social History   Tobacco Use  . Smoking status: Never Smoker  .  Smokeless tobacco: Never Used  Substance Use Topics  . Alcohol use: Not Currently    Comment: 2 glasses of wine weekly - reports none in 2 years    Current Outpatient Medications:  .  acetaminophen-codeine (TYLENOL #3) 300-30 MG tablet, Take 1 tablet by mouth 3 (three) times daily as needed for moderate pain., Disp: 30 tablet, Rfl: 0 .  buPROPion (WELLBUTRIN XL) 150 MG 24 hr tablet, TAKE 1 TABLET(150 MG) BY MOUTH DAILY, Disp: 90 tablet, Rfl: 1 .  buPROPion (WELLBUTRIN XL) 300 MG 24 hr tablet, TAKE 1 TABLET(300 MG) BY MOUTH EVERY MORNING, Disp: 90 tablet, Rfl: 1 .  cetirizine (ZYRTEC) 10 MG tablet, TAKE 1 TABLET(10 MG) BY MOUTH DAILY, Disp: 30 tablet, Rfl: 0 .  Cholecalciferol (VITAMIN D PO), Take 1 tablet by mouth daily. 1000units, Disp: , Rfl:  .  clonazePAM (KLONOPIN) 0.5 MG tablet, TAKE 1 TABLET(0.5 MG) BY MOUTH AT BEDTIME, Disp: 90 tablet, Rfl: 1 .  diclofenac sodium (VOLTAREN) 1 % GEL, APPLY 4 GRAMS EXTERNALLY TO THE AFFECTED AREA FOUR TIMES DAILY, Disp: 100 g, Rfl: 3 .  DULoxetine (CYMBALTA) 60 MG capsule, Take 1 capsule (60 mg total) by mouth 2 (two) times daily., Disp: 180 capsule, Rfl: 2 .  Multiple Vitamin (MULTIVITAMIN) capsule, Take 1 capsule by mouth daily. With iron, Disp: , Rfl:  .  naproxen (NAPROSYN) 500 MG tablet, TAKE 1 TABLET(500 MG) BY MOUTH TWICE DAILY WITH A MEAL, Disp: 60 tablet, Rfl: 0 .  pregabalin (LYRICA) 100 MG capsule, One capsule in the morning  and 2 in the evening, Disp: 270 capsule, Rfl: 1 .  QVAR REDIHALER 80 MCG/ACT inhaler, INHALE 1 PUFF BY MOUTH TWICE DAILY, Disp: 10.6 g, Rfl: 0 .  VENTOLIN HFA 108 (90 Base) MCG/ACT inhaler, INHALE 2 PUFFS BY MOUTH EVERY 4 HOURS AS NEEDED FOR SHORTNESS OF BREATH, Disp: 36 g, Rfl: 0 .  vitamin E 1000 UNIT capsule, Take 2,000 Units by mouth. , Disp: , Rfl:   Allergies  Allergen Reactions  . Aspirin Swelling    Sweating/ swelling of hands and face   . Bee Venom Swelling    Swelling at site     Objective:   There were  no vitals taken for this visit. AAOx3, NAD NCAT, EOMI No obvious CN deficits Coloring WNL Pt is able to speak clearly, coherently without shortness of breath or increased work of breathing.  No visible lump under L arm Thought process is linear.  Mood is appropriate.   Assessment and Plan:   Lump in axilla- new.  Not visible today and per pt report barely palpable.  No TTP today.  Discussed that this may be a lymph node or a hair follicle and that I would need to see it when flared to make a better dx.  Encourage hot compresses, antibacterial soap, and abx ointment prn.  Pt expressed understanding and is in agreement w/ plan.   Constipation- new.  Pt reports intermittent issues that will cause abdominal discomfort.  Good water intake.  Encouraged her to start OTC Miralax as needed in order to have daily BMs.  Pt expressed understanding and is in agreement w/ plan.    Annye Asa, MD 04/05/2020

## 2020-04-07 ENCOUNTER — Telehealth: Payer: Self-pay | Admitting: Family Medicine

## 2020-04-07 NOTE — Progress Notes (Signed)
°  Chronic Care Management   Outreach Note  04/07/2020 Name: INGER DANDREA MRN: [id number] DOB: [birth year]  Referred by: Midge Minium, MD Reason for referral : No chief complaint on file.   An unsuccessful telephone outreach was attempted today. The patient was referred to the pharmacist for assistance with care management and care coordination.    This note is not being shared with the patient for the following reason: To respect privacy (The patient or proxy has requested that the information not be shared).  Follow Up Plan:   Earney Hamburg Upstream Scheduler

## 2020-04-14 ENCOUNTER — Telehealth: Payer: Self-pay | Admitting: Family Medicine

## 2020-04-14 NOTE — Progress Notes (Signed)
  Chronic Care Management   Note  04/14/2020 Name: TACARI STEADMAN MRN: [id number] DOB: 10-10-1956  DANYETTA RIZZUTI is a 64 y.o. year old female who is a primary care patient of Birdie Riddle, Aundra Millet, MD. I reached out to [name] by phone today in response to a referral sent by Ms. Ardeth Perfect Ruffalo's PCP, Midge Minium, MD.   Ms. Bambrick was given information about Chronic Care Management services today including:  1. CCM service includes personalized support from designated clinical staff supervised by her physician, including individualized plan of care and coordination with other care providers 2. 24/7 contact phone numbers for assistance for urgent and routine care needs. 3. Service will only be billed when office clinical staff spend 20 minutes or more in a month to coordinate care. 4. Only one practitioner may furnish and bill the service in a calendar month. 5. The patient may stop CCM services at any time (effective at the end of the month) by phone call to the office staff.   Patient agreed to services and verbal consent obtained.  This note is not being shared with the patient for the following reason: To respect privacy (The patient or proxy has requested that the information not be shared). Follow up plan:   Earney Hamburg Upstream Scheduler

## 2020-04-19 ENCOUNTER — Other Ambulatory Visit: Payer: Self-pay | Admitting: Family Medicine

## 2020-04-19 DIAGNOSIS — F411 Generalized anxiety disorder: Secondary | ICD-10-CM

## 2020-04-19 DIAGNOSIS — F331 Major depressive disorder, recurrent, moderate: Secondary | ICD-10-CM

## 2020-04-20 ENCOUNTER — Other Ambulatory Visit: Payer: Self-pay | Admitting: General Practice

## 2020-04-20 MED ORDER — DICLOFENAC SODIUM 1 % EX GEL: 4.0000 g | Freq: Four times a day (QID) | CUTANEOUS | 3 refills | Status: DC

## 2020-04-21 ENCOUNTER — Other Ambulatory Visit: Payer: Self-pay

## 2020-04-21 ENCOUNTER — Ambulatory Visit
Admission: RE | Admit: 2020-04-21 | Discharge: 2020-04-21 | Disposition: A | Discharge status: Home / Self Care | Payer: Medicare Other | Source: Ambulatory Visit | Attending: Family Medicine | Admitting: Family Medicine

## 2020-04-21 DIAGNOSIS — Z780 Asymptomatic menopausal state: Secondary | ICD-10-CM | POA: Diagnosis not present

## 2020-04-21 DIAGNOSIS — M85852 Other specified disorders of bone density and structure, left thigh: Secondary | ICD-10-CM | POA: Diagnosis not present

## 2020-04-21 DIAGNOSIS — E2839 Other primary ovarian failure: Secondary | ICD-10-CM

## 2020-04-24 NOTE — Progress Notes (Signed)
Called pt and lmovm to return call.

## 2020-04-26 DIAGNOSIS — M5412 Radiculopathy, cervical region: Secondary | ICD-10-CM | POA: Diagnosis not present

## 2020-05-11 ENCOUNTER — Other Ambulatory Visit: Payer: Self-pay | Admitting: Family Medicine

## 2020-05-11 DIAGNOSIS — F411 Generalized anxiety disorder: Secondary | ICD-10-CM

## 2020-05-11 DIAGNOSIS — F331 Major depressive disorder, recurrent, moderate: Secondary | ICD-10-CM

## 2020-05-18 DIAGNOSIS — M5030 Other cervical disc degeneration, unspecified cervical region: Secondary | ICD-10-CM | POA: Diagnosis not present

## 2020-05-18 DIAGNOSIS — M5412 Radiculopathy, cervical region: Secondary | ICD-10-CM | POA: Diagnosis not present

## 2020-05-18 DIAGNOSIS — M48061 Spinal stenosis, lumbar region without neurogenic claudication: Secondary | ICD-10-CM | POA: Diagnosis not present

## 2020-05-18 DIAGNOSIS — Z981 Arthrodesis status: Secondary | ICD-10-CM | POA: Diagnosis not present

## 2020-05-23 ENCOUNTER — Other Ambulatory Visit: Payer: Self-pay | Admitting: General Practice

## 2020-05-23 DIAGNOSIS — E669 Obesity, unspecified: Secondary | ICD-10-CM

## 2020-05-23 DIAGNOSIS — F411 Generalized anxiety disorder: Secondary | ICD-10-CM

## 2020-05-23 DIAGNOSIS — I10 Essential (primary) hypertension: Secondary | ICD-10-CM

## 2020-05-30 ENCOUNTER — Ambulatory Visit: Payer: Medicare Other

## 2020-05-30 ENCOUNTER — Other Ambulatory Visit: Payer: Self-pay

## 2020-05-30 DIAGNOSIS — G4733 Obstructive sleep apnea (adult) (pediatric): Secondary | ICD-10-CM | POA: Diagnosis not present

## 2020-05-30 DIAGNOSIS — F332 Major depressive disorder, recurrent severe without psychotic features: Secondary | ICD-10-CM

## 2020-05-30 DIAGNOSIS — E559 Vitamin D deficiency, unspecified: Secondary | ICD-10-CM

## 2020-05-30 DIAGNOSIS — I10 Essential (primary) hypertension: Secondary | ICD-10-CM

## 2020-05-30 NOTE — Progress Notes (Deleted)
Chronic Care Management Pharmacy  Name: Linda Buckley  MRN: [id number] DOB: 28-Jul-1956  Chief Complaint/ HPI  Linda Buckley,  64 y.o. , female presents for their Initial CCM visit with the clinical pharmacist via telephone due to COVID-19 PandemicDescribes fibromyalgia greatly impacting her mood and sense of self. States that she has not really gotten out of bed since last Friday and really misses doing activities she loves - the greatest of which is work. Previously worked in Mudlogger at [employer] but the burden of chronic pain caused her to stop working sometime between 2016 and 2017. Also misses cooking and is bothered by having to ask for help. She is concerned with the perception people might have of her fibromyalgia and says this limits her from getting out of the house.   Reports to be doing her best to maintain some daily physical activity, walking around the residential complex up to 10-30 minutes depending on pain. Started on hydrocodone 10-325 mg on 05/18/2020 through pain management clinic with Dr. Donell Beers. Feels that stimulation therapy previously helped her pain but no longer benefits from this. Is receiving intermittent steroid injections. Continues with daily Cymbalta and Lyrica. Has been holding Klonopin until course is completed with hydrocodone.   Feels that her finger on right hand has been getting "stuck like a trigger" and this happening about 3 times a week. Reports most recent surgery on right hand for carpal tunnel syndrome on right hand was 4-5 yrs ago.   Also requesting a refill on ventolin as she feels her current inhaler may be expired - states SOB is occasional, denies any worsening symptoms over past 2 months.   PCP : Midge Minium, MD  Chronic conditions include:  Encounter Diagnoses  Name Primary?   Essential hypertension Yes   Severe episode of recurrent major depressive disorder, without psychotic features ([place])    Vitamin D deficiency       Patient Active Problem List   Diagnosis Date Noted   Obesity (BMI 30-39.9) 02/02/2020   Poor compliance with CPAP treatment 02/01/2020   OSA on CPAP 02/01/2020   Chronic left shoulder pain 09/17/2019   MDD (major depressive disorder), recurrent episode, severe ([place]) 05/05/2018   Vitamin D deficiency 11/05/2017   Dysphagia 09/02/2017   Multiple falls 09/04/2016   Hereditary and idiopathic peripheral neuropathy 04/03/2016   Exertional shortness of breath 01/26/2016   Left leg pain 01/26/2016   Fibromyalgia 07/28/2015   Right wrist pain 04/05/2015   Physical exam 03/01/2014   HTN (hypertension) 01/12/2014   Pre-diabetes 01/12/2014   Family history of lupus erythematosus 01/12/2014   Fatigue 10/17/2013   Past Surgical History:  Procedure Laterality Date   CARPAL TUNNEL RELEASE Right 2007   CARPAL TUNNEL RELEASE Right 07/05/2015   Procedure: RIGHT HAND REVISION CARPAL TUNNEL RELEASE;  Surgeon: Iran Planas, MD;  Location: [facility];  Service: Orthopedics;  Laterality: Right;   CARPAL TUNNEL RELEASE Left 09/2016   CERVICAL DISC ARTHROPLASTY  03/2015   COLONOSCOPY W/ POLYPECTOMY  04-15-2014   CYSTO/  RIGHT RETROGRADE PYELOGRAM/ URETEROSCOPY STONE EXTRACTION/  STENT PLACEMENT  06-07-2010   FINGER ARTHRODESIS Right 08/10/2018   Procedure: RIGHT THUMB ARTHRODESIS;  Surgeon: Milly Jakob, MD;  Location: [facility];  Service: Orthopedics;  Laterality: Right;   LAPAROSCOPIC ASSISTED VAGINAL HYSTERECTOMY  04-25-2004   ovaries remain   LAPAROSCOPIC GASTRIC BAND REMOVAL WITH LAPAROSCOPIC GASTRIC SLEEVE RESECTION  10/26/2018   TUBAL LIGATION  1982   Social History  Socioeconomic History   Marital status: Married    Spouse name: Not on file   Number of children: 2   Years of education: 2 yrs college   Highest education level: Not on file  Occupational History   Occupation: [employer]    Comment: Dance movement psychotherapist  Tobacco Use   Smoking  status: Never Smoker   Smokeless tobacco: Never Used  Scientific laboratory technician Use: Never used  Substance and Sexual Activity   Alcohol use: Not Currently    Comment: 2 glasses of wine weekly - reports none in 2 years   Drug use: No    Comment: prescription for klonopin and oxy IR   Sexual activity: Yes    Partners: Male    Birth control/protection: None    Comment: partial hysterectomy  Other Topics Concern   Not on file  Social History Narrative   Lives at home w/ her husband   Right-handed   Drinks 2 cups of coffee weekly    3 bottle water per day   Social Determinants of Health   Financial Resource Strain:    Difficulty of Paying Living Expenses:   Food Insecurity: Landscape architect Present   Worried About Charity fundraiser in the Last Year: Often true   Arboriculturist in the Last Year: Often true  Transportation Needs: No Transportation Needs   Lack of Transportation (Medical): No   Lack of Transportation (Non-Medical): No  Physical Activity:    Days of Exercise per Week:    Minutes of Exercise per Session:   Stress:    Feeling of Stress :   Social Connections:    Frequency of Communication with Friends and Family:    Frequency of Social Gatherings with Friends and Family:    Attends Religious Services:    Active Member of Clubs or Organizations:    Attends Music therapist:    Marital Status:    Family History  Problem Relation Age of Onset   Asthma Father    Diabetes Father    Arthritis Mother    Hypertension Mother    Diabetes Mother    Cancer Mother    Stomach cancer Mother    Diabetes Brother    Diabetes Brother    Asthma Son    Alcohol abuse Son    Anxiety disorder Son    Schizophrenia Son    Cancer Maternal Grandfather        colon   Colon cancer Maternal Grandfather    Cancer Maternal Aunt        breast   Cancer Maternal Uncle        lung   Colon cancer Maternal Uncle    Cancer Maternal  Grandmother        ovarian   Diabetes Son    Allergies  Allergen Reactions   Aspirin Swelling    Sweating/ swelling of hands and face    Bee Venom Swelling    Swelling at site    Outpatient Encounter Medications as of 05/30/2020  Medication Sig   buPROPion (WELLBUTRIN XL) 150 MG 24 hr tablet TAKE 1 TABLET BY MOUTH  DAILY   buPROPion (WELLBUTRIN XL) 300 MG 24 hr tablet TAKE 1 TABLET BY MOUTH IN  THE MORNING   cetirizine (ZYRTEC) 10 MG tablet TAKE 1 TABLET(10 MG) BY MOUTH DAILY   Cholecalciferol (VITAMIN D PO) Take 1 tablet by mouth daily. 1000units   clonazePAM (KLONOPIN) 0.5 MG tablet TAKE 1 TABLET(0.5  MG) BY MOUTH AT BEDTIME   diclofenac Sodium (VOLTAREN) 1 % GEL Apply 4 g topically 4 (four) times daily.   DULoxetine (CYMBALTA) 60 MG capsule Take 1 capsule (60 mg total) by mouth 2 (two) times daily.   HYDROcodone-acetaminophen (NORCO) 10-325 MG tablet Take 1 tablet by mouth every 8 (eight) hours.   Multiple Vitamin (MULTIVITAMIN) capsule Take 1 capsule by mouth daily. With iron   pregabalin (LYRICA) 100 MG capsule One capsule in the morning and 2 in the evening   VENTOLIN HFA 108 (90 Base) MCG/ACT inhaler INHALE 2 PUFFS BY MOUTH EVERY 4 HOURS AS NEEDED FOR SHORTNESS OF BREATH   vitamin E 1000 UNIT capsule Take 2,000 Units by mouth.    acetaminophen-codeine (TYLENOL #3) 300-30 MG tablet Take 1 tablet by mouth 3 (three) times daily as needed for moderate pain. (Patient not taking: Reported on 05/30/2020)   naproxen (NAPROSYN) 500 MG tablet TAKE 1 TABLET(500 MG) BY MOUTH TWICE DAILY WITH A MEAL (Patient not taking: Reported on 05/30/2020)   QVAR REDIHALER 80 MCG/ACT inhaler INHALE 1 PUFF BY MOUTH TWICE DAILY (Patient not taking: Reported on 05/30/2020)   No facility-administered encounter medications on file as of 05/30/2020.   Patient Care Team    Relationship Specialty Notifications Start End  Midge Minium, MD PCP - General Family Medicine  01/12/14   Iran Planas, MD Consulting Physician Orthopedic Surgery  10/23/15   Suella Broad, MD Consulting Physician Physical Medicine and Rehabilitation  10/23/15   Melina Schools, MD Consulting Physician Orthopedic Surgery  04/03/16   Milus Banister, MD Attending Physician Gastroenterology  11/05/17   Kathrynn Ducking, MD Consulting Physician Neurology  11/05/17   Erma Heritage, PA-C Physician Assistant Physician Assistant  11/05/17   Ward Givens, NP Registered Nurse Gerontology  11/05/17   Lorretta Harp, PA-C Physician Assistant Orthopedic Surgery  12/01/17   Madelin Rear, [facility] Pharmacist Pharmacist  04/14/20    Comment: PHONE NUMBER [phone]   Current Diagnosis/Assessment: Goals Addressed            This Visit's Progress    PharmD Care Plan       CARE PLAN ENTRY  Current Barriers:   Chronic Disease Management support, education, and care coordination needs related to Hypertension, Depression, and Vitamin D deficiency   Hypertension  Pharmacist Clinical Goal(s): o Over the next 180 days, patient will work with PharmD and providers to maintain BP goal <130/80  Current regimen:  o Controlled on diet and exercise  Interventions: o Continue current management  Patient self care activities - Over the next 180 days, patient will: o Check BP as directed, document, and provide at future appointments o Ensure daily salt intake < 2300 mg/day MDD  Pharmacist Clinical Goal(s) o Over the next 180 days, patient will work with PharmD and providers to ensure safe and effective therapy and minimize depressive symptoms   Current regimen:   Cymbalta 60 mg twice daily   Bupropion XL 450 mg daily   Interventions: o Continue current management  Patient self care activities - Over the next 180 days, patient will: o Continue current management Vitamin D Deficiency  Pharmacist Clinical Goal(s) o Over the next 180 days, patient will work with PharmD and providers to ensure appropriate  therapy   Current regimen:  o Vitamin D 1000 units daily   Interventions: o Continue current management  Patient self care activities - Over the next 180 days, patient will: o Continue current management  Medication  management  Pharmacist Clinical Goal(s): o Over the next 180 days, patient will work with PharmD and providers to maintain optimal medication adherence  Current pharmacy: [facility]   Interventions o Comprehensive medication review performed. o Continue current medication management strategy  Patient self care activities - Over the next 180 days, patient will: o Take medications as prescribed o Report any questions or concerns to PharmD and/or provider(s)  Initial goal documentation       Hypertension   BP Readings from Last 3 Encounters:  02/24/20 128/74  02/02/20 130/86  02/01/20 122/86   BP currently well controlled, managed on diet and exercise alone. Bariatric surgery 2019. Example diet: egg for breakfast with muffin or cereal/fruit, veggies or cheese for lunch, chicken salmon meatloaf or shrimp for dinner,  Fruit/pretzels for snack.   Plan  Continue control with diet and exercise   Vitamin D deficiency   VITD  Date Value Ref Range Status  02/02/2020 38.70 30.00 - 100.00 ng/mL Final   Recent lab at goal, on Patient is currently controlled on the following medications:   Vitamin D 1000 units daily   Plan  Continue current medications   Pain   Followed by Dr. Donell Beers at pain management clinic. Patient is currently taking:  Norco 10-325 mg every 8 hours  Voltaren 1% gel 4 grams four times daily  Pregabalin 100 mg capsule once in morning and two in evening daily   Plan  Continue current medications   MDD   Ongoing depressive symptoms, phq 16 - previous phq 0 (04/05/2020), 2 (02/02/2020). We discussed current medications and CBT. Is happy with benefit of current medications, today is agreeable to start counseling.  Patient is  currently taking the following medications:   Cymbalta 60 mg twice daily   Bupropion XL 450 mg daily   Plan  Continue current medications   Generalized anxiety disorder   Denies worsening symptoms of anxiety. Currently holding clonazepam until she completes course with hydrocodone. No recent side effects reported.  Clonazepam 0.5 mg at bedtime  Plan  Continue current management.  Recommend referral to behavioral health.   Vaccines   Immunization History  Administered Date(s) Administered   Influenza,inj,Quad PF,6+ Mos 09/15/2014, 10/24/2015, 09/08/2017, 01/29/2019   PFIZER SARS-COV-2 Vaccination 02/29/2020, 03/21/2020   Tdap 11/05/2017   Due for shingrix.  Plan  Recommended patient receive Shingrix vaccine in pharmacy.   Medication Management   Receives prescription medications from:  [facility], [address], [address], [place] [address] Phone: [phone] Fax: [phone]   Denies any issues with current medication management.   Plan  Continue current medication management strategy.  Follow up: 2 month phone visit.  MDD, pain ______________ Visit Information SDOH (Social Determinants of Health) assessments performed: Yes.  Ms. Schneiderman was given information about Chronic Care Management services today including:  1. CCM service includes personalized support from designated clinical staff supervised by her physician, including individualized plan of care and coordination with other care providers 2. 24/7 contact phone numbers for assistance for urgent and routine care needs. 3. Standard insurance, coinsurance, copays and deductibles apply for chronic care management only during months in which we provide at least 20 minutes of these services. Most insurances cover these services at 100%, however patients may be responsible for any copay, coinsurance and/or deductible if  applicable. This service may help you avoid the need for more expensive face-to-face services. 4. Only one practitioner  may furnish and bill the service in a calendar month. 5. The patient may stop CCM services at any time (effective at the end of the month) by phone call to the office staff.  Patient agreed to services and verbal consent obtained.   Madelin Rear, Pharm.D., BCGP Clinical Pharmacist [facility] [phone]

## 2020-05-30 NOTE — Progress Notes (Deleted)
Chronic Care Management Pharmacy  Name: Linda Buckley  MRN: [id number] DOB: [birth year]  Chief Complaint/ HPI  Linda Buckley,  64 y.o. , female presents for their Initial CCM visit with the clinical pharmacist via telephone due to COVID-19 Pandemic.  PCP : Midge Minium, MD  Chronic conditions include:  No diagnosis found.   Office Visits:  ***  Consult Visit: *** Patient Active Problem List   Diagnosis Date Noted  . Obesity (BMI 30-39.9) 02/02/2020  . Poor compliance with CPAP treatment 02/01/2020  . OSA on CPAP 02/01/2020  . Chronic left shoulder pain 09/17/2019  . MDD (major depressive disorder), recurrent episode, severe ([place]) 05/05/2018  . Vitamin D deficiency 11/05/2017  . Dysphagia 09/02/2017  . Multiple falls 09/04/2016  . Hereditary and idiopathic peripheral neuropathy 04/03/2016  . Exertional shortness of breath 01/26/2016  . Left leg pain 01/26/2016  . Fibromyalgia 07/28/2015  . Right wrist pain 04/05/2015  . Physical exam 03/01/2014  . HTN (hypertension) 01/12/2014  . Pre-diabetes 01/12/2014  . Family history of lupus erythematosus 01/12/2014  . Fatigue 10/17/2013   Past Surgical History:  Procedure Laterality Date  . CARPAL TUNNEL RELEASE Right 2007  . CARPAL TUNNEL RELEASE Right 07/05/2015   Procedure: RIGHT HAND REVISION CARPAL TUNNEL RELEASE;  Surgeon: Iran Planas, MD;  Location: [place];  Service: Orthopedics;  Laterality: Right;  . CARPAL TUNNEL RELEASE Left 09/2016  . CERVICAL DISC ARTHROPLASTY  03/2015  . COLONOSCOPY W/ POLYPECTOMY  04-15-2014  . CYSTO/  RIGHT RETROGRADE PYELOGRAM/ URETEROSCOPY STONE EXTRACTION/  STENT PLACEMENT  06-07-2010  . FINGER ARTHRODESIS Right 08/10/2018   Procedure: RIGHT THUMB ARTHRODESIS;  Surgeon: Milly Jakob, MD;  Location: [place];  Service: Orthopedics;  Laterality: Right;  . LAPAROSCOPIC ASSISTED VAGINAL HYSTERECTOMY  04-25-2004   ovaries remain  . LAPAROSCOPIC GASTRIC BAND  REMOVAL WITH LAPAROSCOPIC GASTRIC SLEEVE RESECTION  10/26/2018  . TUBAL LIGATION  1982   Social History   Socioeconomic History  . Marital status: Married    Spouse name: Not on file  . Number of children: 2  . Years of education: 2 yrs college  . Highest education level: Not on file  Occupational History  . 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 Smoking status: Never Smoker  . Smokeless tobacco: Never Used  Vaping Use  . Vaping Use: Never used  Substance and Sexual Activity  . Alcohol use: Not Currently    Comment: 2 glasses of wine weekly - reports none in 2 years  . Drug use: No    Comment: prescription for klonopin and oxy IR  . Sexual activity: Yes    Partners: Male    Birth control/protection: None    Comment: partial hysterectomy  Other Topics Concern  . Not on file  Social History Narrative   Lives at home w/ her husband   Right-handed   Drinks 2 cups of coffee weekly    3 bottle water per day   Social Determinants of Health   Financial Resource Strain:   . Difficulty of Paying Living Expenses:   Food Insecurity:   . Worried About Charity fundraiser in the Last Year:   . Arboriculturist in the Last Year:   Transportation Needs:   . Film/video editor (Medical):   Marland Kitchen Lack of Transportation (Non-Medical):   Physical Activity:   . Days of Exercise per Week:   . Minutes of Exercise per  Session:   Stress:   . Feeling of Stress :   Social Connections:   . Frequency of Communication with Friends and Family:   . Frequency of Social Gatherings with Friends and Family:   . Attends Religious Services:   . Active Member of Clubs or Organizations:   . Attends Archivist Meetings:   Marland Kitchen Marital Status:    Family History  Problem Relation Age of Onset  . Asthma Father   . Diabetes Father   . Arthritis Mother   . Hypertension Mother   . Diabetes Mother   . Cancer Mother   . Stomach cancer Mother   . Diabetes Brother   .  Diabetes Brother   . Asthma Son   . Alcohol abuse Son   . Anxiety disorder Son   . Schizophrenia Son   . Cancer Maternal Grandfather        colon  . Colon cancer Maternal Grandfather   . Cancer Maternal Aunt        breast  . Cancer Maternal Uncle        lung  . Colon cancer Maternal Uncle   . Cancer Maternal Grandmother        ovarian  . Diabetes Son    Allergies  Allergen Reactions  . Aspirin Swelling    Sweating/ swelling of hands and face   . Bee Venom Swelling    Swelling at site    Outpatient Encounter Medications as of 05/30/2020  Medication Sig  . acetaminophen-codeine (TYLENOL #3) 300-30 MG tablet Take 1 tablet by mouth 3 (three) times daily as needed for moderate pain.  Marland Kitchen buPROPion (WELLBUTRIN XL) 150 MG 24 hr tablet TAKE 1 TABLET BY MOUTH  DAILY  . buPROPion (WELLBUTRIN XL) 300 MG 24 hr tablet TAKE 1 TABLET BY MOUTH IN  THE MORNING  . cetirizine (ZYRTEC) 10 MG tablet TAKE 1 TABLET(10 MG) BY MOUTH DAILY  . Cholecalciferol (VITAMIN D PO) Take 1 tablet by mouth daily. 1000units  . clonazePAM (KLONOPIN) 0.5 MG tablet TAKE 1 TABLET(0.5 MG) BY MOUTH AT BEDTIME  . diclofenac Sodium (VOLTAREN) 1 % GEL Apply 4 g topically 4 (four) times daily.  . DULoxetine (CYMBALTA) 60 MG capsule Take 1 capsule (60 mg total) by mouth 2 (two) times daily.  . Multiple Vitamin (MULTIVITAMIN) capsule Take 1 capsule by mouth daily. With iron  . naproxen (NAPROSYN) 500 MG tablet TAKE 1 TABLET(500 MG) BY MOUTH TWICE DAILY WITH A MEAL  . pregabalin (LYRICA) 100 MG capsule One capsule in the morning and 2 in the evening  . QVAR REDIHALER 80 MCG/ACT inhaler INHALE 1 PUFF BY MOUTH TWICE DAILY  . VENTOLIN HFA 108 (90 Base) MCG/ACT inhaler INHALE 2 PUFFS BY MOUTH EVERY 4 HOURS AS NEEDED FOR SHORTNESS OF BREATH  . vitamin E 1000 UNIT capsule Take 2,000 Units by mouth.    No facility-administered encounter medications on file as of 05/30/2020.   Patient Care Team    Relationship Specialty Notifications  Start End  Midge Minium, MD PCP - General Family Medicine  01/12/14   Iran Planas, MD Consulting Physician Orthopedic Surgery  10/23/15   Suella Broad, MD Consulting Physician Physical Medicine and Rehabilitation  10/23/15   Melina Schools, MD Consulting Physician Orthopedic Surgery  04/03/16   Milus Banister, MD Attending Physician Gastroenterology  11/05/17   Kathrynn Ducking, MD Consulting Physician Neurology  11/05/17   Ahmed Prima Fransisco Hertz, PA-C Physician Assistant Physician Assistant  11/05/17  Ward Givens, NP Registered Nurse Gerontology  11/05/17   Lorretta Harp, PA-C Physician Assistant Orthopedic Surgery  12/01/17   Madelin Rear, [facility] Pharmacist Pharmacist  04/14/20    Comment: PHONE NUMBER [phone]   Current Diagnosis/Assessment: Goals Addressed   None    Hypertension   BP today is:  {CHL HP UPSTREAM Pharmacist BP [phone]}  Office blood pressures are  BP Readings from Last 3 Encounters:  02/24/20 128/74  02/02/20 130/86  02/01/20 122/86    Patient has failed these meds in the past: ***  Patient checks BP at home {CHL HP BP Monitoring [phone]}  Patient home BP readings are ranging: ***  We discussed {CHL HP Upstream Pharmacy discussion:[phone]}  Plan  Continue {CHL HP Upstream Pharmacy Plans:[phone]}     COPD / Asthma / Tobacco   Last spirometry score: ***  Gold Grade: {CHL HP Upstream Pharm COPD Gold XQJJH:[id number]} Current COPD Classification:  {CHL HP Upstream Pharm COPD Classification:[phone]}  Eosinophil count:   Lab Results  Component Value Date/Time   EOSPCT 2.2 02/02/2020 10:52 AM  %                               Eos (Absolute):  Lab Results  Component Value Date/Time   EOSABS 0.1 02/02/2020 10:52 AM    Tobacco Status:  Social History   Tobacco Use  Smoking Status Never Smoker  Smokeless Tobacco Never Used    Patient has failed these meds in past: *** Patient is currently {CHL  Controlled/Uncontrolled:[phone]} on the following medications: *** Using maintenance inhaler regularly? {yes/no:20286} Frequency of rescue inhaler use:  {CHL HP Upstream Pharm Inhaler JEHU:3149702637}  We discussed:  {CHL HP Upstream Pharmacy discussion:[phone]}  Plan  Continue {CHL HP Upstream Pharmacy Plans:[phone]}   GERD   Patient {Actions; denies-reports:120008::"denies"} {gerd assoc sx:31969:o:"dysphagia","heartburn","nausea"}.  Expresses understanding to avoid triggers such as {causes; exacerbators GERD:13199}.  Currently {CHL Controlled/Uncontrolled:[phone]} on: . ***  Plan   {rxplan:23810::"Continue current medication."}  Vitamin D deficiency   VITD  Date Value Ref Range Status  02/02/2020 38.70 30.00 - 100.00 ng/mL Final   Patient is currently {CHL Controlled/Uncontrolled:[phone]} on the following medications:  Marland Kitchen Vitamin D 1000 units daily   Plan  Continue {CHL HP Upstream Pharmacy Plans:[phone]}   Pain   Patient has failed these meds in past: *** Patient is currently {CHL Controlled/Uncontrolled:[phone]} on the following medications:  . Naproxen 500 mg tablet twice daily with meal . Voltaren 1% gel 4 grams four times daily . Pregabalin 100 mg capsule once in morning and two in evening daily   We discussed:  ***  Plan  Continue {CHL HP Upstream Pharmacy CHYIF:0277412878}   MDD   Patient has failed these meds in past: *** Patient is currently {CHL Controlled/Uncontrolled:[phone]} on the following medications:  . Cymbalta 60 mg twice daily  . Bupropion 450 mg daily   We discussed:  ***  Plan  Continue {CHL HP Upstream Pharmacy Plans:[phone]}   Generalized anxiety disorder   Patient has failed these meds in past: *** Patient is currently {CHL Controlled/Uncontrolled:[phone]} on the following medications:  . Clonazepam 0.5 mg at bedtime  We discussed:  ***  Plan  Continue {CHL HP Upstream Pharmacy  MVEHM:[id number]}   ***   Patient has failed these meds in past: *** Patient is currently {CHL Controlled/Uncontrolled:[phone]} on the following medications:  . ***  We discussed:  ***  Plan  Continue {CHL HP Upstream Pharmacy MOQHU:7654650354}  Vaccines   Immunization History  Administered Date(s) Administered  . Influenza,inj,Quad PF,6+ Mos 09/15/2014, 10/24/2015, 09/08/2017, 01/29/2019  . PFIZER SARS-COV-2 Vaccination 02/29/2020, 03/21/2020  . Tdap 11/05/2017   Due for shingrix.  Plan  Recommended patient receive *** vaccine in *** office/pharmacy.   Medication Management   Receives prescription medications from:  [facility], [address], [address], [place] [address] Phone: [phone] Fax: [phone]   *** Plan  {[facility] ITVI:71252}.  Follow up: *** month phone visit.  *** ______________ Visit Information SDOH (Social Determinants of Health) assessments performed: Yes.  Ms. Seher was given information about Chronic Care Management services today including:  1. CCM service includes personalized support from designated clinical staff supervised by her physician, including individualized plan of care and coordination with other care providers 2. 24/7 contact phone numbers for assistance for urgent and routine care needs. 3. Standard insurance, coinsurance, copays and deductibles apply for chronic care management only during months in which we provide at least 20 minutes of these services. Most insurances cover these services at 100%, however patients may be responsible for any copay, coinsurance and/or deductible if applicable. This service may help you avoid the need for more expensive face-to-face services. 4. Only one practitioner may furnish and bill the service in a calendar month. 5. The patient may stop CCM services at any time (effective at the end of the month) by  phone call to the office staff.  Patient agreed to services and verbal consent obtained.   Madelin Rear, Pharm.D., BCGP Clinical Pharmacist [facility] [phone]

## 2020-05-30 NOTE — Progress Notes (Signed)
Chronic Care Management Pharmacy  Name: Linda Buckley  MRN: [id number] DOB: 1956/07/26  Chief Complaint/ HPI  Linda Buckley,  64 y.o. , female presents for their Initial CCM visit with the clinical pharmacist via telephone due to COVID-19 PandemicDescribes fibromyalgia greatly impacting her mood and sense of self. States that she has not really gotten out of bed since last Friday and really misses doing activities she loves - the greatest of which is work. Previously worked in [employer] at [employer] but the burden of chronic pain caused her to stop working sometime between 2016 and 2017. Also misses cooking and is bothered by needing to ask for help with this. She is concerned with the perception people might have of her fibromyalgia and says this limits her from getting out of the house.   Reports to be doing her best to maintain some daily physical activity, walking around the residential complex up to 10-30 minutes depending on pain. Started on hydrocodone 10-325 mg on 05/18/2020 through pain management clinic with Dr. Donell Beers. Feels that stimulation therapy previously helped her pain but no longer benefits from this. Is receiving intermittent steroid injections. Continues with daily Cymbalta and Lyrica. Has been holding Klonopin until course is completed with hydrocodone.   Feels that finger on right hand has been getting "stuck like a trigger" and this happening about 3 times a week. Reports most recent surgery on right hand for carpal tunnel syndrome was 4-5 yrs ago.   Also requesting a refill on ventolin as she feels her current inhaler may be expired - states SOB is occasional, denies any worsening symptoms over past 2 months.   PCP : Linda Minium, MD  Chronic conditions include:  Encounter Diagnoses  Name Primary?  . Essential hypertension Yes  . Severe episode of recurrent major depressive disorder, without psychotic features ([place])   . Vitamin D deficiency      Patient Active Problem List   Diagnosis Date Noted  . Obesity (BMI 30-39.9) 02/02/2020  . Poor compliance with CPAP treatment 02/01/2020  . OSA on CPAP 02/01/2020  . Chronic left shoulder pain 09/17/2019  . MDD (major depressive disorder), recurrent episode, severe ([place]) 05/05/2018  . Vitamin D deficiency 11/05/2017  . Dysphagia 09/02/2017  . Multiple falls 09/04/2016  . Hereditary and idiopathic peripheral neuropathy 04/03/2016  . Exertional shortness of breath 01/26/2016  . Left leg pain 01/26/2016  . Fibromyalgia 07/28/2015  . Right wrist pain 04/05/2015  . Physical exam 03/01/2014  . HTN (hypertension) 01/12/2014  . Pre-diabetes 01/12/2014  . Family history of lupus erythematosus 01/12/2014  . Fatigue 10/17/2013   Past Surgical History:  Procedure Laterality Date  . CARPAL TUNNEL RELEASE Right 2007  . CARPAL TUNNEL RELEASE Right 07/05/2015   Procedure: RIGHT HAND REVISION CARPAL TUNNEL RELEASE;  Surgeon: Iran Planas, MD;  Location: [facility];  Service: Orthopedics;  Laterality: Right;  . CARPAL TUNNEL RELEASE Left 09/2016  . CERVICAL DISC ARTHROPLASTY  03/2015  . COLONOSCOPY W/ POLYPECTOMY  04-15-2014  . CYSTO/  RIGHT RETROGRADE PYELOGRAM/ URETEROSCOPY STONE EXTRACTION/  STENT PLACEMENT  06-07-2010  . FINGER ARTHRODESIS Right 08/10/2018   Procedure: RIGHT THUMB ARTHRODESIS;  Surgeon: Milly Jakob, MD;  Location: [facility];  Service: Orthopedics;  Laterality: Right;  . LAPAROSCOPIC ASSISTED VAGINAL HYSTERECTOMY  04-25-2004   ovaries remain  . LAPAROSCOPIC GASTRIC BAND REMOVAL WITH LAPAROSCOPIC GASTRIC SLEEVE RESECTION  10/26/2018  . TUBAL LIGATION  1982   Social History   Socioeconomic  History  . Marital status: Married    Spouse name: Not on file  . Number of children: 2  . Years of education: 2 yrs college  . Highest education level: Not on file  Occupational History  . 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 Smoking  status: Never Smoker  . Smokeless tobacco: Never Used  Vaping Use  . Vaping Use: Never used  Substance and Sexual Activity  . Alcohol use: Not Currently    Comment: 2 glasses of wine weekly - reports none in 2 years  . Drug use: No    Comment: prescription for klonopin and oxy IR  . Sexual activity: Yes    Partners: Male    Birth control/protection: None    Comment: partial hysterectomy  Other Topics Concern  . Not on file  Social History Narrative   Lives at home w/ her husband   Right-handed   Drinks 2 cups of coffee weekly    3 bottle water per day   Social Determinants of Health   Financial Resource Strain:   . Difficulty of Paying Living Expenses:   Food Insecurity: Food Insecurity Present  . Worried About Charity fundraiser in the Last Year: Often true  . Ran Out of Food in the Last Year: Often true  Transportation Needs: No Transportation Needs  . Lack of Transportation (Medical): No  . Lack of Transportation (Non-Medical): No  Physical Activity:   . Days of Exercise per Week:   . Minutes of Exercise per Session:   Stress:   . Feeling of Stress :   Social Connections:   . Frequency of Communication with Friends and Family:   . Frequency of Social Gatherings with Friends and Family:   . Attends Religious Services:   . Active Member of Clubs or Organizations:   . Attends Archivist Meetings:   Marland Kitchen Marital Status:    Family History  Problem Relation Age of Onset  . Asthma Father   . Diabetes Father   . Arthritis Mother   . Hypertension Mother   . Diabetes Mother   . Cancer Mother   . Stomach cancer Mother   . Diabetes Brother   . Diabetes Brother   . Asthma Son   . Alcohol abuse Son   . Anxiety disorder Son   . Schizophrenia Son   . Cancer Maternal Grandfather        colon  . Colon cancer Maternal Grandfather   . Cancer Maternal Aunt        breast  . Cancer Maternal Uncle        lung  . Colon cancer Maternal Uncle   . Cancer Maternal  Grandmother        ovarian  . Diabetes Son    Allergies  Allergen Reactions  . Aspirin Swelling    Sweating/ swelling of hands and face   . Bee Venom Swelling    Swelling at site    Outpatient Encounter Medications as of 05/30/2020  Medication Sig  . buPROPion (WELLBUTRIN XL) 150 MG 24 hr tablet TAKE 1 TABLET BY MOUTH  DAILY  . buPROPion (WELLBUTRIN XL) 300 MG 24 hr tablet TAKE 1 TABLET BY MOUTH IN  THE MORNING  . cetirizine (ZYRTEC) 10 MG tablet TAKE 1 TABLET(10 MG) BY MOUTH DAILY  . Cholecalciferol (VITAMIN D PO) Take 1 tablet by mouth daily. 1000units  . clonazePAM (KLONOPIN) 0.5 MG tablet TAKE 1 TABLET(0.5 MG)  BY MOUTH AT BEDTIME  . diclofenac Sodium (VOLTAREN) 1 % GEL Apply 4 g topically 4 (four) times daily.  . DULoxetine (CYMBALTA) 60 MG capsule Take 1 capsule (60 mg total) by mouth 2 (two) times daily.  Marland Kitchen HYDROcodone-acetaminophen (NORCO) 10-325 MG tablet Take 1 tablet by mouth every 8 (eight) hours.  . Multiple Vitamin (MULTIVITAMIN) capsule Take 1 capsule by mouth daily. With iron  . pregabalin (LYRICA) 100 MG capsule One capsule in the morning and 2 in the evening  . VENTOLIN HFA 108 (90 Base) MCG/ACT inhaler INHALE 2 PUFFS BY MOUTH EVERY 4 HOURS AS NEEDED FOR SHORTNESS OF BREATH  . vitamin E 1000 UNIT capsule Take 2,000 Units by mouth.   Marland Kitchen acetaminophen-codeine (TYLENOL #3) 300-30 MG tablet Take 1 tablet by mouth 3 (three) times daily as needed for moderate pain. (Patient not taking: Reported on 05/30/2020)  . naproxen (NAPROSYN) 500 MG tablet TAKE 1 TABLET(500 MG) BY MOUTH TWICE DAILY WITH A MEAL (Patient not taking: Reported on 05/30/2020)  . QVAR REDIHALER 80 MCG/ACT inhaler INHALE 1 PUFF BY MOUTH TWICE DAILY (Patient not taking: Reported on 05/30/2020)   No facility-administered encounter medications on file as of 05/30/2020.   Patient Care Team    Relationship Specialty Notifications Start End  Linda Minium, MD PCP - General Family Medicine  01/12/14   Iran Planas, MD Consulting Physician Orthopedic Surgery  10/23/15   Suella Broad, MD Consulting Physician Physical Medicine and Rehabilitation  10/23/15   Melina Schools, MD Consulting Physician Orthopedic Surgery  04/03/16   Milus Banister, MD Attending Physician Gastroenterology  11/05/17   Kathrynn Ducking, MD Consulting Physician Neurology  11/05/17   Ahmed Prima Fransisco Hertz, PA-C Physician Assistant Physician Assistant  11/05/17   Ward Givens, NP Registered Nurse Gerontology  11/05/17   Lorretta Harp, PA-C Physician Assistant Orthopedic Surgery  12/01/17   Madelin Rear, [facility] Pharmacist Pharmacist  04/14/20    Comment: PHONE NUMBER [phone]   Current Diagnosis/Assessment: Goals Addressed            This Visit's Progress   . PharmD Care Plan       CARE PLAN ENTRY  Current Barriers:  . Chronic Disease Management support, education, and care coordination needs related to Hypertension, Depression, and Vitamin D deficiency   Hypertension . Pharmacist Clinical Goal(s): o Over the next 180 days, patient will work with PharmD and providers to maintain BP goal <130/80 . Current regimen:  o Controlled on diet and exercise . Interventions: o Continue current management . Patient self care activities - Over the next 180 days, patient will: o Check BP as directed, document, and provide at future appointments o Ensure daily salt intake < 2300 mg/day MDD . Pharmacist Clinical Goal(s) o Over the next 180 days, patient will work with PharmD and providers to ensure safe and effective therapy and minimize depressive symptoms  . Current regimen:  . Cymbalta 60 mg twice daily  . Bupropion XL 450 mg daily  . Interventions: o Continue current management . Patient self care activities - Over the next 180 days, patient will: o Continue current management Vitamin D Deficiency . Pharmacist Clinical Goal(s) o Over the next 180 days, patient will work with PharmD and providers to ensure appropriate  therapy  . Current regimen:  o Vitamin D 1000 units daily  . Interventions: o Continue current management . Patient self care activities - Over the next 180 days, patient will: o Continue current management  Medication management .  Pharmacist Clinical Goal(s): o Over the next 180 days, patient will work with PharmD and providers to maintain optimal medication adherence . Current pharmacy: [facility]  . Interventions o Comprehensive medication review performed. o Continue current medication management strategy . Patient self care activities - Over the next 180 days, patient will: o Take medications as prescribed o Report any questions or concerns to PharmD and/or provider(s)  Initial goal documentation       MDD   Ongoing depressive symptoms, phq 16 - previous phq 0 (04/05/2020), 2 (02/02/2020). We discussed current medications and CBT. Feels content with current medications but is agreeable to start seeing behavioral health.   Patient is currently taking the following medications:  . Cymbalta 60 mg twice daily  . Bupropion XL 450 mg daily   Plan  Continue current medications.  Recommend referral to behavioral health.   Hypertension   BP Readings from Last 3 Encounters:  02/24/20 128/74  02/02/20 130/86  02/01/20 122/86   BP currently well controlled, managed on diet and exercise alone. Bariatric surgery 2019. Example diet: egg for breakfast with muffin or cereal/fruit, veggies or cheese for lunch, chicken salmon meatloaf or shrimp for dinner,  fruit/pretzels for snack. Walking 10-30 minutes daily for exercise.  Plan  Continue control with diet and exercise   Vitamin D deficiency   VITD  Date Value Ref Range Status  02/02/2020 38.70 30.00 - 100.00 ng/mL Final   Recent lab at goal, on Patient is currently controlled on the following medications:  Marland Kitchen Vitamin D 1000 units daily   Plan  Continue current medications   Pain   Followed by Dr. Donell Beers at pain  management clinic. Patient is currently taking: . Norco 10-325 mg every 8 hours . Voltaren 1% gel 4 grams four times daily . Pregabalin 100 mg capsule once in morning and two in evening daily   Plan  Continue current medications   Generalized anxiety disorder   Denies worsening symptoms of anxiety. Currently holding clonazepam until she completes course with hydrocodone. No recent side effects reported. . Clonazepam 0.5 mg at bedtime  Plan  Continue current management.   Medication Management   Receives prescription medications from:  [facility], [address], [address], [place] [address] Phone: [phone] Fax: [phone]   Denies any issues with current medication management.   Plan  Continue current medication management strategy.  Follow up: 2 month phone visit.  MDD, pain ______________ Visit Information SDOH (Social Determinants of Health) assessments performed: Yes.  Ms. Egelhoff was given information about Chronic Care Management services today including:  1. CCM service includes personalized support from designated clinical staff supervised by her physician, including individualized plan of care and coordination with other care providers 2. 24/7 contact phone numbers for assistance for urgent and routine care needs. 3. Standard insurance, coinsurance, copays and deductibles apply for chronic care management only during months in which we provide at least 20 minutes of these services. Most insurances cover these services at 100%, however patients may be responsible for any copay, coinsurance and/or deductible if applicable. This service may help you avoid the need for more expensive face-to-face services. 4. Only one practitioner may furnish and bill the service in a calendar month. 5. The patient may stop CCM services at any time (effective at the end of the month) by phone call to the  office staff.  Patient agreed to services and verbal consent  obtained.   Madelin Rear, Pharm.D., BCGP Clinical Pharmacist [facility] [phone]

## 2020-05-30 NOTE — Patient Instructions (Addendum)
Please call me at [phone] (direct line) with any questions - thank you!  - Edyth Gunnels., Clinical Pharmacist  Goals Addressed            This Visit's Progress   . PharmD Care Plan       CARE PLAN ENTRY  Current Barriers:  . Chronic Disease Management support, education, and care coordination needs related to Hypertension, Depression, and Vitamin D deficiency   Hypertension . Pharmacist Clinical Goal(s): o Over the next 180 days, patient will work with PharmD and providers to maintain BP goal <130/80 . Current regimen:  o Controlled on diet and exercise . Interventions: o Continue current management . Patient self care activities - Over the next 180 days, patient will: o Check BP as directed, document, and provide at future appointments o Ensure daily salt intake < 2300 mg/day MDD . Pharmacist Clinical Goal(s) o Over the next 180 days, patient will work with PharmD and providers to ensure safe and effective therapy and minimize depressive symptoms  . Current regimen:  . Cymbalta 60 mg twice daily  . Bupropion XL 450 mg daily  . Interventions: o Continue current management o Referral for counseling . Patient self care activities - Over the next 180 days, patient will: o Continue current management  Vitamin D Deficiency . Pharmacist Clinical Goal(s) o Over the next 180 days, patient will work with PharmD and providers to ensure appropriate therapy  . Current regimen:  o Vitamin D 1000 units daily  . Interventions: o Continue current management . Patient self care activities - Over the next 180 days, patient will: o Continue current management  Medication management . Pharmacist Clinical Goal(s): o Over the next 180 days, patient will work with PharmD and providers to maintain optimal medication adherence . Current pharmacy: [facility]  . Interventions o Comprehensive medication review performed. o Continue current medication management strategy . Patient self care  activities - Over the next 180 days, patient will: o Take medications as prescribed o Report any questions or concerns to PharmD and/or provider(s)  Initial goal documentation.      Ms. Lampton was given information about Chronic Care Management services today including:  1. CCM service includes personalized support from designated clinical staff supervised by her physician, including individualized plan of care and coordination with other care providers 2. 24/7 contact phone numbers for assistance for urgent and routine care needs. 3. Standard insurance, coinsurance, copays and deductibles apply for chronic care management only during months in which we provide at least 20 minutes of these services. Most insurances cover these services at 100%, however patients may be responsible for any copay, coinsurance and/or deductible if applicable. This service may help you avoid the need for more expensive face-to-face services. 4. Only one practitioner may furnish and bill the service in a calendar month. 5. The patient may stop CCM services at any time (effective at the end of the month) by phone call to the office staff.  Patient agreed to services and verbal consent obtained.   The patient verbalized understanding of instructions provided today and agreed to receive a mailed copy of patient instruction and/or educational materials. Telephone follow up appointment with pharmacy team member scheduled for: See next appointment with "Care Management Staff" under "What's Next" below.   Thank you!  Madelin Rear, Pharm.D., BCGP Clinical Pharmacist [facility] [phone]  Vitamin D Deficiency Vitamin D deficiency is when your body does not have enough vitamin D. Vitamin D  is important to your body because:  It helps your body use other minerals.  It helps to keep your bones strong and healthy.  It may help to prevent some diseases.  It helps your heart and  other muscles work well. Not getting enough vitamin D can make your bones soft. It can also cause other health problems. What are the causes? This condition may be caused by:  Not eating enough foods that contain vitamin D.  Not getting enough sun.  Having diseases that make it hard for your body to absorb vitamin D.  Having a surgery in which a part of the stomach or a part of the small intestine is removed.  Having kidney disease or liver disease. What increases the risk? You are more likely to get this condition if:  You are older.  You do not spend much time outdoors.  You live in a nursing home.  You have had broken bones.  You have weak or thin bones (osteoporosis).  You have a disease or condition that changes how your body absorbs vitamin D.  You have dark skin.  You take certain medicines.  You are overweight or obese. What are the signs or symptoms?  In mild cases, there may not be any symptoms. If the condition is very bad, symptoms may include: ? Bone pain. ? Muscle pain. ? Falling often. ? Broken bones caused by a minor injury. How is this treated? Treatment may include taking supplements as told by your doctor. Your doctor will tell you what dose is best for you. Supplements may include:  Vitamin D.  Calcium. Follow these instructions at home: Eating and drinking   Eat foods that contain vitamin D, such as: ? Dairy products, cereals, or juices with added vitamin D. Check the label. ? Fish, such as salmon or trout. ? Eggs. ? Oysters. ? Mushrooms. The items listed above may not be a complete list of what you can eat and drink. Contact a dietitian for more options. General instructions  Take medicines and supplements only as told by your doctor.  Get regular, safe exposure to natural sunlight.  Do not use a tanning bed.  Maintain a healthy weight. Lose weight if needed.  Keep all follow-up visits as told by your doctor. This is  important. How is this prevented?  You can get vitamin D by: ? Eating foods that naturally contain vitamin D. ? Eating or drinking products that have vitamin D added to them, such as cereals, juices, and milk. ? Taking vitamin D or a multivitamin that contains vitamin D. ? Being in the sun. Your body makes vitamin D when your skin is exposed to sunlight. Your body changes the sunlight into a form of the vitamin that it can use. Contact a doctor if:  Your symptoms do not go away.  You feel sick to your stomach (nauseous).  You throw up (vomit).  You poop less often than normal, or you have trouble pooping (constipation). Summary  Vitamin D deficiency is when your body does not have enough vitamin D.  Vitamin D helps to keep your bones strong and healthy.  This condition is often treated by taking a supplement.  Your doctor will tell you what dose is best for you. This information is not intended to replace advice given to you by your health care provider. Make sure you discuss any questions you have with your health care provider. Document Revised: 08/10/2018 Document Reviewed: 08/10/2018 Elsevier Patient Education  [phone]  [place] With Depression Everyone experiences occasional disappointment, sadness, and loss in their lives. When you are feeling down, blue, or sad for at least 2 weeks in a row, it may mean that you have depression. Depression can affect your thoughts and feelings, relationships, daily activities, and physical health. It is caused by changes in the way your brain functions. If you receive a diagnosis of depression, your health care provider will tell you which type of depression you have and what treatment options are available to you. If you are living with depression, there are ways to help you recover from it and also ways to prevent it from coming back. How to cope with lifestyle changes Coping with stress     Stress is your body's reaction to  life changes and events, both good and bad. Stressful situations may include:  Getting married.  The death of a spouse.  Losing a job.  Retiring.  Having a baby. Stress can last just a few hours or it can be ongoing. Stress can play a major role in depression, so it is important to learn both how to cope with stress and how to think about it differently. Talk with your health care provider or a counselor if you would like to learn more about stress reduction. He or she may suggest some stress reduction techniques, such as:  Music therapy. This can include creating music or listening to music. Choose music that you enjoy and that inspires you.  Mindfulness-based meditation. This kind of meditation can be done while sitting or walking. It involves being aware of your normal breaths, rather than trying to control your breathing.  Centering prayer. This is a kind of meditation that involves focusing on a spiritual word or phrase. Choose a word, phrase, or sacred image that is meaningful to you and that brings you peace.  Deep breathing. To do this, expand your stomach and inhale slowly through your nose. Hold your breath for 3-5 seconds, then exhale slowly, allowing your stomach muscles to relax.  Muscle relaxation. This involves intentionally tensing muscles then relaxing them. Choose a stress reduction technique that fits your lifestyle and personality. Stress reduction techniques take time and practice to develop. Set aside 5-15 minutes a day to do them. Therapists can offer training in these techniques. The training may be covered by some insurance plans. Other things you can do to manage stress include:  Keeping a stress diary. This can help you learn what triggers your stress and ways to control your response.  Understanding what your limits are and saying no to requests or events that lead to a schedule that is too full.  Thinking about how you respond to certain situations. You may  not be able to control everything, but you can control how you react.  Adding humor to your life by watching funny films or TV shows.  Making time for activities that help you relax and not feeling guilty about spending your time this way.  Medicines Your health care provider may suggest certain medicines if he or she feels that they will help improve your condition. Avoid using alcohol and other substances that may prevent your medicines from working properly (may interact). It is also important to:  Talk with your pharmacist or health care provider about all the medicines that you take, their possible side effects, and what medicines are safe to take together.  Make it your goal to take part in all treatment decisions (shared decision-making). This  includes giving input on the side effects of medicines. It is best if shared decision-making with your health care provider is part of your total treatment plan. If your health care provider prescribes a medicine, you may not notice the full benefits of it for 4-8 weeks. Most people who are treated for depression need to be on medicine for at least 6-12 months after they feel better. If you are taking medicines as part of your treatment, do not stop taking medicines without first talking to your health care provider. You may need to have the medicine slowly decreased (tapered) over time to decrease the risk of harmful side effects. Relationships Your health care provider may suggest family therapy along with individual therapy and drug therapy. While there may not be family problems that are causing you to feel depressed, it is still important to make sure your family learns as much as they can about your mental health. Having your family's support can help make your treatment successful. How to recognize changes in your condition Everyone has a different response to treatment for depression. Recovery from major depression happens when you have not had  signs of major depression for two months. This may mean that you will start to:  Have more interest in doing activities.  Feel less hopeless than you did 2 months ago.  Have more energy.  Overeat less often, or have better or improving appetite.  Have better concentration. Your health care provider will work with you to decide the next steps in your recovery. It is also important to recognize when your condition is getting worse. Watch for these signs:  Having fatigue or low energy.  Eating too much or too little.  Sleeping too much or too little.  Feeling restless, agitated, or hopeless.  Having trouble concentrating or making decisions.  Having unexplained physical complaints.  Feeling irritable, angry, or aggressive. Get help as soon as you or your family members notice these symptoms coming back. How to get support and help from others How to talk with friends and family members about your condition  Talking to friends and family members about your condition can provide you with one way to get support and guidance. Reach out to trusted friends or family members, explain your symptoms to them, and let them know that you are working with a health care provider to treat your depression. Financial resources Not all insurance plans cover mental health care, so it is important to check with your insurance carrier. If paying for co-pays or counseling services is a problem, search for a local or county mental health care center. They may be able to offer public mental health care services at low or no cost when you are not able to see a private health care provider. If you are taking medicine for depression, you may be able to get the generic form, which may be less expensive. Some makers of prescription medicines also offer help to patients who cannot afford the medicines they need. Follow these instructions at home:   Get the right amount and quality of sleep.  Cut down on using  caffeine, tobacco, alcohol, and other potentially harmful substances.  Try to exercise, such as walking or lifting small weights.  Take over-the-counter and prescription medicines only as told by your health care provider.  Eat a healthy diet that includes plenty of vegetables, fruits, whole grains, low-fat dairy products, and lean protein. Do not eat a lot of foods that are high in solid fats,  added sugars, or salt.  Keep all follow-up visits as told by your health care provider. This is important. Contact a health care provider if:  You stop taking your antidepressant medicines, and you have any of these symptoms: ? Nausea. ? Headache. ? Feeling lightheaded. ? Chills and body aches. ? Not being able to sleep (insomnia).  You or your friends and family think your depression is getting worse. Get help right away if:  You have thoughts of hurting yourself or others. If you ever feel like you may hurt yourself or others, or have thoughts about taking your own life, get help right away. You can go to your nearest emergency department or call:  Your local emergency services (911 in the [place]  A suicide crisis helpline, such as the [facility] at 613-591-0876. This is open 24-hours a day. Summary  If you are living with depression, there are ways to help you recover from it and also ways to prevent it from coming back.  Work with your health care team to create a management plan that includes counseling, stress management techniques, and healthy lifestyle habits. This information is not intended to replace advice given to you by your health care provider. Make sure you discuss any questions you have with your health care provider. Document Revised: 03/26/2019 Document Reviewed: 11/04/2016 Elsevier Patient Education  [place].

## 2020-06-01 ENCOUNTER — Other Ambulatory Visit: Payer: Self-pay | Admitting: General Practice

## 2020-06-01 DIAGNOSIS — F411 Generalized anxiety disorder: Secondary | ICD-10-CM

## 2020-06-01 DIAGNOSIS — F331 Major depressive disorder, recurrent, moderate: Secondary | ICD-10-CM

## 2020-06-01 DIAGNOSIS — M6530 Trigger finger, unspecified finger: Secondary | ICD-10-CM

## 2020-06-01 MED ORDER — ALBUTEROL SULFATE HFA 108 (90 BASE) MCG/ACT IN AERS: INHALATION_SPRAY | RESPIRATORY_TRACT | 0 refills | Status: DC

## 2020-06-01 NOTE — Progress Notes (Signed)
Medication filled to pharmacy as requested.  Referrals placed.

## 2020-06-26 DIAGNOSIS — M67912 Unspecified disorder of synovium and tendon, left shoulder: Secondary | ICD-10-CM | POA: Diagnosis not present

## 2020-06-28 DIAGNOSIS — M5127 Other intervertebral disc displacement, lumbosacral region: Secondary | ICD-10-CM | POA: Diagnosis not present

## 2020-06-28 DIAGNOSIS — M48061 Spinal stenosis, lumbar region without neurogenic claudication: Secondary | ICD-10-CM | POA: Diagnosis not present

## 2020-06-28 DIAGNOSIS — M5417 Radiculopathy, lumbosacral region: Secondary | ICD-10-CM | POA: Diagnosis not present

## 2020-06-28 DIAGNOSIS — M5137 Other intervertebral disc degeneration, lumbosacral region: Secondary | ICD-10-CM | POA: Diagnosis not present

## 2020-06-28 DIAGNOSIS — M5136 Other intervertebral disc degeneration, lumbar region: Secondary | ICD-10-CM | POA: Diagnosis not present

## 2020-06-28 DIAGNOSIS — M5126 Other intervertebral disc displacement, lumbar region: Secondary | ICD-10-CM | POA: Diagnosis not present

## 2020-06-29 ENCOUNTER — Ambulatory Visit (INDEPENDENT_AMBULATORY_CARE_PROVIDER_SITE_OTHER): Payer: Medicare Other | Admitting: Psychologist

## 2020-06-29 DIAGNOSIS — F411 Generalized anxiety disorder: Secondary | ICD-10-CM

## 2020-06-29 DIAGNOSIS — F331 Major depressive disorder, recurrent, moderate: Secondary | ICD-10-CM | POA: Diagnosis not present

## 2020-07-11 ENCOUNTER — Ambulatory Visit: Payer: Medicare Other | Admitting: Psychologist

## 2020-07-18 DIAGNOSIS — M5412 Radiculopathy, cervical region: Secondary | ICD-10-CM | POA: Diagnosis not present

## 2020-07-18 DIAGNOSIS — M5136 Other intervertebral disc degeneration, lumbar region: Secondary | ICD-10-CM | POA: Diagnosis not present

## 2020-07-18 DIAGNOSIS — M5417 Radiculopathy, lumbosacral region: Secondary | ICD-10-CM | POA: Diagnosis not present

## 2020-07-18 DIAGNOSIS — M48061 Spinal stenosis, lumbar region without neurogenic claudication: Secondary | ICD-10-CM | POA: Diagnosis not present

## 2020-07-21 ENCOUNTER — Ambulatory Visit: Payer: Medicare Other

## 2020-07-21 NOTE — Progress Notes (Unsigned)
Chronic Care Management Pharmacy ***Not seen  Name: Linda Buckley  MRN: [id number] DOB: July 03, 1956  Chief Complaint/ HPI  Linda Buckley,  64 y.o. , female presents for their Initial CCM visit with the clinical pharmacist via telephone due to COVID-19 Pandemic PCP : Midge Minium, MD  Exercise  Diet  suporrt  Chronic conditions include:  No diagnosis found.  Patient Active Problem List   Diagnosis Date Noted   Obesity (BMI 30-39.9) 02/02/2020   Poor compliance with CPAP treatment 02/01/2020   OSA on CPAP 02/01/2020   Chronic left shoulder pain 09/17/2019   MDD (major depressive disorder), recurrent episode, severe ([place]) 05/05/2018   Vitamin D deficiency 11/05/2017   Dysphagia 09/02/2017   Multiple falls 09/04/2016   Hereditary and idiopathic peripheral neuropathy 04/03/2016   Exertional shortness of breath 01/26/2016   Left leg pain 01/26/2016   Fibromyalgia 07/28/2015   Right wrist pain 04/05/2015   Physical exam 03/01/2014   HTN (hypertension) 01/12/2014   Pre-diabetes 01/12/2014   Family history of lupus erythematosus 01/12/2014   Fatigue 10/17/2013   Past Surgical History:  Procedure Laterality Date   CARPAL TUNNEL RELEASE Right 2007   CARPAL TUNNEL RELEASE Right 07/05/2015   Procedure: RIGHT HAND REVISION CARPAL TUNNEL RELEASE;  Surgeon: Iran Planas, MD;  Location: [facility];  Service: Orthopedics;  Laterality: Right;   CARPAL TUNNEL RELEASE Left 09/2016   CERVICAL DISC ARTHROPLASTY  03/2015   COLONOSCOPY W/ POLYPECTOMY  04-15-2014   CYSTO/  RIGHT RETROGRADE PYELOGRAM/ URETEROSCOPY STONE EXTRACTION/  STENT PLACEMENT  06-07-2010   FINGER ARTHRODESIS Right 08/10/2018   Procedure: RIGHT THUMB ARTHRODESIS;  Surgeon: Milly Jakob, MD;  Location: [facility];  Service: Orthopedics;  Laterality: Right;   LAPAROSCOPIC ASSISTED VAGINAL HYSTERECTOMY  04-25-2004   ovaries remain   LAPAROSCOPIC GASTRIC BAND REMOVAL WITH LAPAROSCOPIC GASTRIC  SLEEVE RESECTION  10/26/2018   TUBAL LIGATION  1982   Social History   Socioeconomic History   Marital status: Married    Spouse name: Not on file   Number of children: 2   Years of education: 2 yrs college   Highest education level: Not on file  Occupational History   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, broadcasting  Tobacco Use   Smoking status: Never Smoker   Smokeless tobacco: Never Used  Scientific laboratory technician Use: Never used  Substance and Sexual Activity   Alcohol use: Not Currently    Comment: 2 glasses of wine weekly - reports none in 2 years   Drug use: No    Comment: prescription for klonopin and oxy IR   Sexual activity: Yes    Partners: Male    Birth control/protection: None    Comment: partial hysterectomy  Other Topics Concern   Not on file  Social History Narrative   Lives at home w/ her husband   Right-handed   Drinks 2 cups of coffee weekly    3 bottle water per day   Social Determinants of Health   Financial Resource Strain:    Difficulty of Paying Living Expenses:   Food Insecurity: Food Insecurity Present   Worried About Charity fundraiser in the Last Year: Often true   Arboriculturist in the Last Year: Often true  Transportation Needs: No Transportation Needs   Lack of Transportation (Medical): No   Lack of Transportation (Non-Medical): No  Physical Activity:    Days of Exercise per Week:  Minutes of Exercise per Session:   Stress:    Feeling of Stress :   Social Connections:    Frequency of Communication with Friends and Family:    Frequency of Social Gatherings with Friends and Family:    Attends Religious Services:    Active Member of Clubs or Organizations:    Attends Music therapist:    Marital Status:    Family History  Problem Relation Age of Onset   Asthma Father    Diabetes Father    Arthritis Mother    Hypertension Mother    Diabetes Mother    Cancer Mother    Stomach cancer Mother    Diabetes Brother     Diabetes Brother    Asthma Son    Alcohol abuse Son    Anxiety disorder Son    Schizophrenia Son    Cancer Maternal Grandfather        colon   Colon cancer Maternal Grandfather    Cancer Maternal Aunt        breast   Cancer Maternal Uncle        lung   Colon cancer Maternal Uncle    Cancer Maternal Grandmother        ovarian   Diabetes Son    Allergies  Allergen Reactions   Aspirin Swelling    Sweating/ swelling of hands and face    Bee Venom Swelling    Swelling at site    Outpatient Encounter Medications as of 07/21/2020  Medication Sig   acetaminophen-codeine (TYLENOL #3) 300-30 MG tablet Take 1 tablet by mouth 3 (three) times daily as needed for moderate pain. (Patient not taking: Reported on 05/30/2020)   albuterol (VENTOLIN HFA) 108 (90 Base) MCG/ACT inhaler INHALE 2 PUFFS BY MOUTH EVERY 4 HOURS AS NEEDED FOR SHORTNESS OF BREATH   buPROPion (WELLBUTRIN XL) 150 MG 24 hr tablet TAKE 1 TABLET BY MOUTH  DAILY   buPROPion (WELLBUTRIN XL) 300 MG 24 hr tablet TAKE 1 TABLET BY MOUTH IN  THE MORNING   cetirizine (ZYRTEC) 10 MG tablet TAKE 1 TABLET(10 MG) BY MOUTH DAILY   Cholecalciferol (VITAMIN D PO) Take 1 tablet by mouth daily. 1000units   clonazePAM (KLONOPIN) 0.5 MG tablet TAKE 1 TABLET(0.5 MG) BY MOUTH AT BEDTIME   diclofenac Sodium (VOLTAREN) 1 % GEL Apply 4 g topically 4 (four) times daily.   DULoxetine (CYMBALTA) 60 MG capsule Take 1 capsule (60 mg total) by mouth 2 (two) times daily.   Multiple Vitamin (MULTIVITAMIN) capsule Take 1 capsule by mouth daily. With iron   naproxen (NAPROSYN) 500 MG tablet TAKE 1 TABLET(500 MG) BY MOUTH TWICE DAILY WITH A MEAL (Patient not taking: Reported on 05/30/2020)   pregabalin (LYRICA) 100 MG capsule One capsule in the morning and 2 in the evening   QVAR REDIHALER 80 MCG/ACT inhaler INHALE 1 PUFF BY MOUTH TWICE DAILY (Patient not taking: Reported on 05/30/2020)   vitamin E 1000 UNIT capsule Take 2,000 Units by mouth.    No  facility-administered encounter medications on file as of 07/21/2020.   Patient Care Team    Relationship Specialty Notifications Start End  Midge Minium, MD PCP - General Family Medicine  01/12/14   Iran Planas, MD Consulting Physician Orthopedic Surgery  10/23/15   Suella Broad, MD Consulting Physician Physical Medicine and Rehabilitation  10/23/15   Melina Schools, MD Consulting Physician Orthopedic Surgery  04/03/16   Milus Banister, MD Attending Physician Gastroenterology  11/05/17  Kathrynn Ducking, MD Consulting Physician Neurology  11/05/17   Erma Heritage, PA-C Physician Assistant Physician Assistant  11/05/17   Ward Givens, NP Registered Nurse Gerontology  11/05/17   Lorretta Harp, PA-C Physician Assistant Orthopedic Surgery  12/01/17   Madelin Rear, [facility] Pharmacist Pharmacist  04/14/20    Comment: PHONE NUMBER [phone]   Current Diagnosis/Assessment: Goals Addressed   None    MDD   Ongoing depressive symptoms, phq 16 - previous phq 0 (04/05/2020), 2 (02/02/2020). We discussed current medications and CBT. Feels content with current medications but is agreeable to start seeing behavioral health.   Patient is currently taking the following medications:  Cymbalta 60 mg twice daily  Bupropion XL 450 mg daily   Plan  Continue current medications.  Recommend referral to behavioral health.   Hypertension   BP Readings from Last 3 Encounters:  02/24/20 128/74  02/02/20 130/86  02/01/20 122/86   BP currently well controlled, managed on diet and exercise alone. Bariatric surgery 2019. Example diet: egg for breakfast with muffin or cereal/fruit, veggies or cheese for lunch, chicken salmon meatloaf or shrimp for dinner,  fruit/pretzels for snack. Walking 10-30 minutes daily for exercise.  Plan  Continue control with diet and exercise   Vitamin D deficiency   VITD  Date Value Ref Range Status  02/02/2020 38.70 30.00 - 100.00 ng/mL Final   Recent lab  at goal, on Patient is currently controlled on the following medications:  Vitamin D 1000 units daily   Plan  Continue current medications   Pain   Followed by Dr. Donell Beers at pain management clinic. Patient is currently taking: Norco 10-325 mg every 8 hours Voltaren 1% gel 4 grams four times daily Pregabalin 100 mg capsule once in morning and two in evening daily   Plan  Continue current medications   Generalized anxiety disorder   Denies worsening symptoms of anxiety. Currently holding clonazepam until she completes course with hydrocodone. No recent side effects reported. Clonazepam 0.5 mg at bedtime  Plan  Continue current management.   Medication Management   Receives prescription medications from:  [facility], [address], [address], [place] [address] Phone: [phone] Fax: [phone]   Denies any issues with current medication management.   Plan  Continue current medication management strategy.  Follow up: 2 month phone visit.  MDD, pain ______________ Visit Information SDOH (Social Determinants of Health) assessments performed: Yes.  Ms. Spohr was given information about Chronic Care Management services today including:  CCM service includes personalized support from designated clinical staff supervised by her physician, including individualized plan of care and coordination with other care providers 24/7 contact phone numbers for assistance for urgent and routine care needs. Standard insurance, coinsurance, copays and deductibles apply for chronic care management only during months in which we provide at least 20 minutes of these services. Most insurances cover these services at 100%, however patients may be responsible for any copay, coinsurance and/or deductible if applicable. This service may help you avoid the need for more expensive face-to-face services. Only one  practitioner may furnish and bill the service in a calendar month. The patient may stop CCM services at any time (effective at the end of the month) by phone call to the office staff.  Patient agreed to services and verbal consent obtained.   Madelin Rear, Pharm.D., BCGP Clinical Pharmacist [facility] [phone]

## 2020-07-25 ENCOUNTER — Telehealth (INDEPENDENT_AMBULATORY_CARE_PROVIDER_SITE_OTHER): Payer: Medicare Other | Admitting: Physician Assistant

## 2020-07-25 ENCOUNTER — Encounter: Payer: Self-pay | Admitting: Physician Assistant

## 2020-07-25 ENCOUNTER — Other Ambulatory Visit: Payer: Self-pay

## 2020-07-25 VITALS — BP 138/101 | HR 61 | Temp 99.8°F

## 2020-07-25 DIAGNOSIS — J029 Acute pharyngitis, unspecified: Secondary | ICD-10-CM | POA: Diagnosis not present

## 2020-07-25 DIAGNOSIS — R05 Cough: Secondary | ICD-10-CM | POA: Diagnosis not present

## 2020-07-25 DIAGNOSIS — R519 Headache, unspecified: Secondary | ICD-10-CM | POA: Diagnosis not present

## 2020-07-25 DIAGNOSIS — Z20822 Contact with and (suspected) exposure to covid-19: Secondary | ICD-10-CM

## 2020-07-25 DIAGNOSIS — R5383 Other fatigue: Secondary | ICD-10-CM | POA: Diagnosis not present

## 2020-07-25 MED ORDER — CETIRIZINE HCL 10 MG PO TABS: ORAL_TABLET | ORAL | 0 refills | Status: AC

## 2020-07-25 MED ORDER — ALBUTEROL SULFATE HFA 108 (90 BASE) MCG/ACT IN AERS: INHALATION_SPRAY | RESPIRATORY_TRACT | 0 refills | Status: DC

## 2020-07-25 MED ORDER — FLUTICASONE PROPIONATE 50 MCG/ACT NA SUSP: 2.0000 | Freq: Every day | NASAL | 0 refills | Status: DC

## 2020-07-25 NOTE — Progress Notes (Signed)
I have discussed the procedure for the virtual visit with the patient who has given consent to proceed with assessment and treatment.   Kolbie Lepkowski S Lamoyne Hessel, CMA     

## 2020-07-25 NOTE — Progress Notes (Signed)
Virtual Visit via Video   I connected with patient on 07/25/20 at  2:30 PM EDT by a video enabled telemedicine application and verified that I am speaking with the correct person using two identifiers.  Location patient: Home Location provider: [facility], Office Persons participating in the virtual visit: Patient, Provider, [place] (Patina Moore)  I discussed the limitations of evaluation and management by telemedicine and the availability of in person appointments. The patient expressed understanding and agreed to proceed.  Subjective:   HPI:   Patient presents via Care[facility] today 2.5 days of headache, nasal congestion, wheezing, dry cough, body aches, sore throat and chills.  Notes fever at 99.8 today only.  Denies any chest pain or overt shortness of breath.  Is out of her allergy medication and inhaler.  Denies recent travel.  Notes her parents were sick about a week ago with very similar symptoms.  Patient has had her Covid vaccine.    ROS:   See pertinent positives and negatives per HPI.  Patient Active Problem List   Diagnosis Date Noted   Obesity (BMI 30-39.9) 02/02/2020   Poor compliance with CPAP treatment 02/01/2020   OSA on CPAP 02/01/2020   Chronic left shoulder pain 09/17/2019   MDD (major depressive disorder), recurrent episode, severe ([place]) 05/05/2018   Vitamin D deficiency 11/05/2017   Dysphagia 09/02/2017   Multiple falls 09/04/2016   Hereditary and idiopathic peripheral neuropathy 04/03/2016   Exertional shortness of breath 01/26/2016   Left leg pain 01/26/2016   Fibromyalgia 07/28/2015   Right wrist pain 04/05/2015   Physical exam 03/01/2014   HTN (hypertension) 01/12/2014   Pre-diabetes 01/12/2014   Family history of lupus erythematosus 01/12/2014   Fatigue 10/17/2013    Social History   Tobacco Use   Smoking status: Never Smoker   Smokeless tobacco: Never Used  Substance Use Topics   Alcohol use: Not Currently     Comment: 2 glasses of wine weekly - reports none in 2 years    Current Outpatient Medications:    albuterol (VENTOLIN HFA) 108 (90 Base) MCG/ACT inhaler, INHALE 2 PUFFS BY MOUTH EVERY 4 HOURS AS NEEDED FOR SHORTNESS OF BREATH, Disp: 36 g, Rfl: 0   buPROPion (WELLBUTRIN XL) 150 MG 24 hr tablet, TAKE 1 TABLET BY MOUTH  DAILY, Disp: 90 tablet, Rfl: 3   buPROPion (WELLBUTRIN XL) 300 MG 24 hr tablet, TAKE 1 TABLET BY MOUTH IN  THE MORNING, Disp: 90 tablet, Rfl: 1   cetirizine (ZYRTEC) 10 MG tablet, TAKE 1 TABLET(10 MG) BY MOUTH DAILY, Disp: 30 tablet, Rfl: 0   Cholecalciferol (VITAMIN D PO), Take 1 tablet by mouth daily. 1000units, Disp: , Rfl:    clonazePAM (KLONOPIN) 0.5 MG tablet, TAKE 1 TABLET(0.5 MG) BY MOUTH AT BEDTIME, Disp: 90 tablet, Rfl: 1   diclofenac Sodium (VOLTAREN) 1 % GEL, Apply 4 g topically 4 (four) times daily., Disp: 100 g, Rfl: 3   DULoxetine (CYMBALTA) 60 MG capsule, Take 1 capsule (60 mg total) by mouth 2 (two) times daily., Disp: 180 capsule, Rfl: 2   Multiple Vitamin (MULTIVITAMIN) capsule, Take 1 capsule by mouth daily. With iron, Disp: , Rfl:    pregabalin (LYRICA) 100 MG capsule, One capsule in the morning and 2 in the evening, Disp: 270 capsule, Rfl: 1   vitamin E 1000 UNIT capsule, Take 2,000 Units by mouth. , Disp: , Rfl:   Allergies  Allergen Reactions   Aspirin Swelling    Sweating/ swelling of hands and face  Bee Venom Swelling    Swelling at site     Objective:   BP (!) 138/101    Pulse 61    Temp 99.8 F (37.7 C) (Oral)   Patient is well-developed, well-nourished in no acute distress.  Resting comfortably on bed at home.  Head is normocephalic, atraumatic.  No labored breathing.  Speech is clear and coherent with logical content.  Patient is alert and oriented at baseline.   Assessment and Plan:   1. Suspected COVID-19 virus infection Patient to go get testing. Instructions for this given.  Patient enrolled in [place] symptom  monitoring program.  She is to quarantine until results are in.  Increase fluids.  Restart Zyrtec.  Refill sent.  Rx Flonase.  Ventolin refilled.  Patient to start vitamin regimen as discussed.  Supportive measures and OTC medications reviewed.  Strict ER precautions reviewed with patient.  Patient voiced understanding and agreement with plan. - fluticasone (FLONASE) 50 MCG/ACT nasal spray; Place 2 sprays into both nostrils daily.  Dispense: 16 g; Refill: 0 - cetirizine (ZYRTEC) 10 MG tablet; TAKE 1 TABLET(10 MG) BY MOUTH DAILY  Dispense: 30 tablet; Refill: 0 - albuterol (VENTOLIN HFA) 108 (90 Base) MCG/ACT inhaler; INHALE 2 PUFFS BY MOUTH EVERY 4 HOURS AS NEEDED FOR SHORTNESS OF BREATH  Dispense: 36 g; Refill: 0 .   Leeanne Rio, PA-C 07/25/2020

## 2020-07-25 NOTE — Patient Instructions (Signed)
Instructions sent to MyChart

## 2020-08-28 ENCOUNTER — Ambulatory Visit: Payer: Medicare Other | Admitting: Neurology

## 2020-08-28 DIAGNOSIS — G4733 Obstructive sleep apnea (adult) (pediatric): Secondary | ICD-10-CM | POA: Diagnosis not present

## 2020-08-30 ENCOUNTER — Other Ambulatory Visit: Payer: Self-pay | Admitting: Neurology

## 2020-08-30 DIAGNOSIS — F331 Major depressive disorder, recurrent, moderate: Secondary | ICD-10-CM

## 2020-08-30 DIAGNOSIS — F411 Generalized anxiety disorder: Secondary | ICD-10-CM

## 2020-09-15 DIAGNOSIS — Z79899 Other long term (current) drug therapy: Secondary | ICD-10-CM | POA: Diagnosis not present

## 2020-09-15 DIAGNOSIS — M5136 Other intervertebral disc degeneration, lumbar region: Secondary | ICD-10-CM | POA: Diagnosis not present

## 2020-09-15 DIAGNOSIS — M47816 Spondylosis without myelopathy or radiculopathy, lumbar region: Secondary | ICD-10-CM | POA: Diagnosis not present

## 2020-09-15 DIAGNOSIS — Z5181 Encounter for therapeutic drug level monitoring: Secondary | ICD-10-CM | POA: Diagnosis not present

## 2020-09-15 DIAGNOSIS — M48061 Spinal stenosis, lumbar region without neurogenic claudication: Secondary | ICD-10-CM | POA: Diagnosis not present

## 2020-09-15 DIAGNOSIS — M5412 Radiculopathy, cervical region: Secondary | ICD-10-CM | POA: Diagnosis not present

## 2020-09-18 DIAGNOSIS — R519 Headache, unspecified: Secondary | ICD-10-CM | POA: Diagnosis not present

## 2020-09-18 DIAGNOSIS — R110 Nausea: Secondary | ICD-10-CM | POA: Diagnosis not present

## 2020-09-18 DIAGNOSIS — R42 Dizziness and giddiness: Secondary | ICD-10-CM | POA: Diagnosis not present

## 2020-09-19 ENCOUNTER — Other Ambulatory Visit: Payer: Self-pay

## 2020-09-19 ENCOUNTER — Telehealth (INDEPENDENT_AMBULATORY_CARE_PROVIDER_SITE_OTHER): Payer: Medicare Other | Admitting: Family Medicine

## 2020-09-19 ENCOUNTER — Encounter: Payer: Self-pay | Admitting: Family Medicine

## 2020-09-19 DIAGNOSIS — Z20822 Contact with and (suspected) exposure to covid-19: Secondary | ICD-10-CM | POA: Diagnosis not present

## 2020-09-19 DIAGNOSIS — J329 Chronic sinusitis, unspecified: Secondary | ICD-10-CM | POA: Diagnosis not present

## 2020-09-19 DIAGNOSIS — B9689 Other specified bacterial agents as the cause of diseases classified elsewhere: Secondary | ICD-10-CM

## 2020-09-19 MED ORDER — AMOXICILLIN 875 MG PO TABS: 875.0000 mg | ORAL_TABLET | Freq: Two times a day (BID) | ORAL | 0 refills | Status: DC

## 2020-09-19 NOTE — Progress Notes (Signed)
I have discussed the procedure for the virtual visit with the patient who has given consent to proceed with assessment and treatment.   Pt unable to obtain vitals.   Brittnie Lewey L Esther Broyles, CMA     

## 2020-09-19 NOTE — Progress Notes (Signed)
Virtual Visit via Video   I connected with patient on 09/19/20 at 11:30 AM EDT by a video enabled telemedicine application and verified that I am speaking with the correct person using two identifiers.  Location patient: Home Location provider: [facility], Office Persons participating in the virtual visit: Patient, Provider, [place] (Jess B)  I discussed the limitations of evaluation and management by telemedicine and the availability of in person appointments. The patient expressed understanding and agreed to proceed.  Subjective:   HPI:   Fatigue/HA/dizziness- sxs started ~1 week ago.  Pt got stung by a bee (she's allergic) around the same time, used her epipen.  She did not seek medical care at that time.  Denies fevers/chills.  HA is frontal.  Mild body aches.  Had 3 days of diarrhea- Friday/Sat/Sunday.  + nausea.  No cough, sore throat, changes to taste/smell.  Dizziness is described as 'lightheaded'  + facial pain.  Pt is vaccinated against COVID.  ROS:   See pertinent positives and negatives per HPI.  Patient Active Problem List   Diagnosis Date Noted  . Obesity (BMI 30-39.9) 02/02/2020  . Poor compliance with CPAP treatment 02/01/2020  . OSA on CPAP 02/01/2020  . Chronic left shoulder pain 09/17/2019  . MDD (major depressive disorder), recurrent episode, severe ([place]) 05/05/2018  . Vitamin D deficiency 11/05/2017  . Dysphagia 09/02/2017  . Multiple falls 09/04/2016  . Hereditary and idiopathic peripheral neuropathy 04/03/2016  . Exertional shortness of breath 01/26/2016  . Left leg pain 01/26/2016  . Fibromyalgia 07/28/2015  . Right wrist pain 04/05/2015  . Physical exam 03/01/2014  . HTN (hypertension) 01/12/2014  . Pre-diabetes 01/12/2014  . Family history of lupus erythematosus 01/12/2014  . Fatigue 10/17/2013    Social History   Tobacco Use  . Smoking status: Never Smoker  . Smokeless tobacco: Never Used  Substance Use Topics  . Alcohol use: Not  Currently    Comment: 2 glasses of wine weekly - reports none in 2 years    Current Outpatient Medications:  .  albuterol (VENTOLIN HFA) 108 (90 Base) MCG/ACT inhaler, INHALE 2 PUFFS BY MOUTH EVERY 4 HOURS AS NEEDED FOR SHORTNESS OF BREATH, Disp: 36 g, Rfl: 0 .  buPROPion (WELLBUTRIN XL) 150 MG 24 hr tablet, TAKE 1 TABLET BY MOUTH  DAILY, Disp: 90 tablet, Rfl: 3 .  buPROPion (WELLBUTRIN XL) 300 MG 24 hr tablet, TAKE 1 TABLET BY MOUTH IN  THE MORNING, Disp: 90 tablet, Rfl: 1 .  cetirizine (ZYRTEC) 10 MG tablet, TAKE 1 TABLET(10 MG) BY MOUTH DAILY, Disp: 30 tablet, Rfl: 0 .  Cholecalciferol (VITAMIN D PO), Take 1 tablet by mouth daily. 1000units, Disp: , Rfl:  .  diclofenac Sodium (VOLTAREN) 1 % GEL, Apply 4 g topically 4 (four) times daily., Disp: 100 g, Rfl: 3 .  DULoxetine (CYMBALTA) 60 MG capsule, TAKE 1 CAPSULE BY MOUTH  TWICE DAILY, Disp: 180 capsule, Rfl: 1 .  [START ON 09/25/2020] HYDROcodone-acetaminophen (NORCO) 10-325 MG tablet, Take by mouth., Disp: , Rfl:  .  Multiple Vitamin (MULTIVITAMIN) capsule, Take 1 capsule by mouth daily. With iron, Disp: , Rfl:  .  pregabalin (LYRICA) 100 MG capsule, One capsule in the morning and 2 in the evening, Disp: 270 capsule, Rfl: 1 .  vitamin E 1000 UNIT capsule, Take 2,000 Units by mouth. , Disp: , Rfl:  .  fluticasone (FLONASE) 50 MCG/ACT nasal spray, Place 2 sprays into both nostrils daily. (Patient not taking: Reported on 09/19/2020), Disp: 16  g, Rfl: 0  Allergies  Allergen Reactions  . Aspirin Swelling    Sweating/ swelling of hands and face   . Bee Venom Swelling    Swelling at site     Objective:   There were no vitals taken for this visit. AAOx3, NAD NCAT, EOMI No obvious CN deficits Coloring WNL Pt is able to speak clearly, coherently without shortness of breath or increased work of breathing.  Thought process is linear.  Mood is appropriate.   Assessment and Plan:   Sinusitis- pt has hx of sinus infxns and reports that in  some ways this feels similar but in other ways it's different.  + HA, facial pain, dizziness are consistent w/ sinus infection.  Nausea and diarrhea, fatigue, and body aches are more consistent w/ COVID.  Pt agreeable to get COVID test (she is fully vaccinated) and will do that today.  In the meantime, will start abx in case of bacterial sinusitis.  Reviewed supportive care and red flags that should prompt return.  Pt expressed understanding and is in agreement w/ plan.    Annye Asa, MD 09/19/2020

## 2020-09-20 DIAGNOSIS — Z20822 Contact with and (suspected) exposure to covid-19: Secondary | ICD-10-CM | POA: Diagnosis not present

## 2020-09-20 DIAGNOSIS — J069 Acute upper respiratory infection, unspecified: Secondary | ICD-10-CM | POA: Diagnosis not present

## 2020-11-23 ENCOUNTER — Other Ambulatory Visit: Payer: Self-pay | Admitting: Family Medicine

## 2020-11-23 DIAGNOSIS — F411 Generalized anxiety disorder: Secondary | ICD-10-CM

## 2020-11-23 DIAGNOSIS — F331 Major depressive disorder, recurrent, moderate: Secondary | ICD-10-CM

## 2021-01-04 DIAGNOSIS — R059 Cough, unspecified: Secondary | ICD-10-CM | POA: Diagnosis not present

## 2021-01-04 DIAGNOSIS — Z03818 Encounter for observation for suspected exposure to other biological agents ruled out: Secondary | ICD-10-CM | POA: Diagnosis not present

## 2021-01-04 DIAGNOSIS — R197 Diarrhea, unspecified: Secondary | ICD-10-CM | POA: Diagnosis not present

## 2021-01-04 DIAGNOSIS — G4483 Primary cough headache: Secondary | ICD-10-CM | POA: Diagnosis not present

## 2021-01-04 DIAGNOSIS — J029 Acute pharyngitis, unspecified: Secondary | ICD-10-CM | POA: Diagnosis not present

## 2021-01-04 DIAGNOSIS — R52 Pain, unspecified: Secondary | ICD-10-CM | POA: Diagnosis not present

## 2021-01-09 DIAGNOSIS — G4733 Obstructive sleep apnea (adult) (pediatric): Secondary | ICD-10-CM | POA: Diagnosis not present

## 2021-01-10 DIAGNOSIS — K912 Postsurgical malabsorption, not elsewhere classified: Secondary | ICD-10-CM | POA: Diagnosis not present

## 2021-01-10 DIAGNOSIS — Z903 Acquired absence of stomach [part of]: Secondary | ICD-10-CM | POA: Diagnosis not present

## 2021-01-23 DIAGNOSIS — K912 Postsurgical malabsorption, not elsewhere classified: Secondary | ICD-10-CM | POA: Diagnosis not present

## 2021-01-23 DIAGNOSIS — I10 Essential (primary) hypertension: Secondary | ICD-10-CM | POA: Diagnosis not present

## 2021-01-23 DIAGNOSIS — K219 Gastro-esophageal reflux disease without esophagitis: Secondary | ICD-10-CM | POA: Diagnosis not present

## 2021-01-23 DIAGNOSIS — M5417 Radiculopathy, lumbosacral region: Secondary | ICD-10-CM | POA: Diagnosis not present

## 2021-02-01 ENCOUNTER — Ambulatory Visit: Payer: Medicare Other | Admitting: Neurology

## 2021-02-01 ENCOUNTER — Telehealth: Payer: Self-pay

## 2021-02-01 NOTE — Chronic Care Management (AMB) (Signed)
    Chronic Care Management Pharmacy Assistant   Name: LACREASHA HINDS  MRN: [id number] DOB: [birth year]  Reason for Encounter: Reschedule Follow-Up Appointment With Clinical Pharmacist  Attempted to reach the patient to reschedule her appointment with Madelin Rear, CPP. I left a message for the patient to return my call [phone].  April D Calhoun, [place] Pharmacist Assistant [phone]   Follow-Up:  Pharmacist Review

## 2021-02-02 NOTE — Progress Notes (Signed)
Subjective:   Linda Buckley is a 65 y.o. female who presents for Medicare Annual (Subsequent) preventive examination.  Review of Systems     Cardiac Risk Factors include: hypertension;obesity (BMI >30kg/m2)     Objective:    Today's Vitals   02/05/21 1057  BP: 117/80  Pulse: 74  Resp: 18  Temp: (!) 97.5 F (36.4 C)  TempSrc: Oral  SpO2: 98%  Weight: 212 lb (96.2 kg)  Height: 5\' 6"  (1.676 m)   Body mass index is 34.22 kg/m.  Advanced Directives 02/05/2021 08/10/2018 08/04/2018 05/04/2018 12/08/2017 10/23/2017 12/06/2016  Does Patient Have a Medical Advance Directive? No No No No No No No  Type of Advance Directive - - - - - - -  Does patient want to make changes to medical advance directive? - - - - - - -  Copy of [place] in Chart? - - - - - - -  Would patient like information on creating a medical advance directive? Yes (MAU/Ambulatory/Procedural Areas - Information given) No - Patient declined No - Patient declined No - Patient declined - - -  Pre-existing out of facility DNR order (yellow form or pink MOST form) - - - - - - -  Some encounter information is confidential and restricted. Go to Review Flowsheets activity to see all data.    Current Medications (verified) Outpatient Encounter Medications as of 02/05/2021  Medication Sig  . albuterol (VENTOLIN HFA) 108 (90 Base) MCG/ACT inhaler INHALE 2 PUFFS BY MOUTH EVERY 4 HOURS AS NEEDED FOR SHORTNESS OF BREATH  . buPROPion (WELLBUTRIN XL) 150 MG 24 hr tablet TAKE 1 TABLET BY MOUTH  DAILY  . buPROPion (WELLBUTRIN XL) 300 MG 24 hr tablet TAKE 1 TABLET BY MOUTH IN  THE MORNING  . cetirizine (ZYRTEC) 10 MG tablet TAKE 1 TABLET(10 MG) BY MOUTH DAILY  . Cholecalciferol (VITAMIN D PO) Take 1 tablet by mouth daily. 1000units  . diclofenac Sodium (VOLTAREN) 1 % GEL Apply 4 g topically 4 (four) times daily.  . Multiple Vitamin (MULTIVITAMIN) capsule Take 1 capsule by mouth daily. With iron  . pregabalin  (LYRICA) 100 MG capsule One capsule in the morning and 2 in the evening  . [DISCONTINUED] amoxicillin (AMOXIL) 875 MG tablet Take 1 tablet (875 mg total) by mouth 2 (two) times daily. (Patient not taking: Reported on 02/05/2021)  . [DISCONTINUED] DULoxetine (CYMBALTA) 60 MG capsule TAKE 1 CAPSULE BY MOUTH  TWICE DAILY  . [DISCONTINUED] fluticasone (FLONASE) 50 MCG/ACT nasal spray Place 2 sprays into both nostrils daily. (Patient not taking: No sig reported)  . [DISCONTINUED] vitamin E 1000 UNIT capsule Take 2,000 Units by mouth.  (Patient not taking: Reported on 02/05/2021)   No facility-administered encounter medications on file as of 02/05/2021.    Allergies (verified) Aspirin and Bee venom   History: Past Medical History:  Diagnosis Date  . Allergy   . Anxiety   . Arthritis   . Carpal tunnel syndrome of right wrist    RECURRENT  . Depression   . Dysphagia 09/02/2017  . Edema 09/02/2017  . Environmental allergies    dust mites, pollen, roaches and other insects, mold  . Fibromyalgia 04/2016  . Headache    migraines  . Hereditary and idiopathic peripheral neuropathy 04/03/2016  . History of adenomatous polyp of colon   . History of gastric ulcer   . History of kidney stones   . Hypertension   . Pneumonia   . Sleep apnea  wears CPAP  . Wears glasses    Past Surgical History:  Procedure Laterality Date  . CARPAL TUNNEL RELEASE Right 2007  . CARPAL TUNNEL RELEASE Right 07/05/2015   Procedure: RIGHT HAND REVISION CARPAL TUNNEL RELEASE;  Surgeon: Iran Planas, MD;  Location: [facility];  Service: Orthopedics;  Laterality: Right;  . CARPAL TUNNEL RELEASE Left 09/2016  . CERVICAL DISC ARTHROPLASTY  03/2015  . COLONOSCOPY W/ POLYPECTOMY  04-15-2014  . CYSTO/  RIGHT RETROGRADE PYELOGRAM/ URETEROSCOPY STONE EXTRACTION/  STENT PLACEMENT  06-07-2010  . FINGER ARTHRODESIS Right 08/10/2018   Procedure: RIGHT THUMB ARTHRODESIS;  Surgeon: Milly Jakob, MD;  Location: [facility];  Service: Orthopedics;  Laterality: Right;  . LAPAROSCOPIC ASSISTED VAGINAL HYSTERECTOMY  04-25-2004   ovaries remain  . LAPAROSCOPIC GASTRIC BAND REMOVAL WITH LAPAROSCOPIC GASTRIC SLEEVE RESECTION  10/26/2018  . TUBAL LIGATION  1982   Family History  Problem Relation Age of Onset  . Asthma Father   . Diabetes Father   . Arthritis Mother   . Hypertension Mother   . Diabetes Mother   . Cancer Mother   . Stomach cancer Mother   . Diabetes Brother   . Diabetes Brother   . Asthma Son   . Alcohol abuse Son   . Anxiety disorder Son   . Schizophrenia Son   . Cancer Maternal Grandfather        colon  . Colon cancer Maternal Grandfather   . Cancer Maternal Aunt        breast  . Cancer Maternal Uncle        lung  . Colon cancer Maternal Uncle   . Cancer Maternal Grandmother        ovarian  . Diabetes Son    Social History   Socioeconomic History  . Marital status: Married    Spouse name: Not on file  . Number of children: 2  . Years of education: 2 yrs college  . Highest education level: Not on file  Occupational History  . 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 Smoking status: Never Smoker  . Smokeless tobacco: Never Used  Vaping Use  . Vaping Use: Never used  Substance and Sexual Activity  . Alcohol use: Not Currently    Comment: 2 glasses of wine weekly - reports none in 2 years  . Drug use: No    Comment: prescription for klonopin and oxy IR  . Sexual activity: Yes    Partners: Male    Birth control/protection: None    Comment: partial hysterectomy  Other Topics Concern  . Not on file  Social History Narrative   Lives at home w/ her husband   Right-handed   Drinks 2 cups of coffee weekly    3 bottle water per day   Social Determinants of Health   Financial Resource Strain: Low Risk   . Difficulty of Paying Living Expenses: Not very hard  Food Insecurity: No Food Insecurity  . Worried About Charity fundraiser in the  Last Year: Never true  . Ran Out of Food in the Last Year: Never true  Transportation Needs: No Transportation Needs  . Lack of Transportation (Medical): No  . Lack of Transportation (Non-Medical): No  Physical Activity: Sufficiently Active  . Days of Exercise per Week: 7 days  . Minutes of Exercise per Session: 30 min  Stress: No Stress Concern Present  . Feeling of Stress : Not at all  Social Connections: Moderately Integrated  . Frequency of Communication with Friends and Family: More than three times a week  . Frequency of Social Gatherings with Friends and Family: More than three times a week  . Attends Religious Services: 1 to 4 times per year  . Active Member of Clubs or Organizations: No  . Attends Archivist Meetings: Never  . Marital Status: Married    Tobacco Counseling Counseling given: Not Answered   Clinical Intake:  Pre-visit preparation completed: Yes  Pain : No/denies pain     Nutritional Status: BMI > 30  Obese Nutritional Risks: None Diabetes: No  How often do you need to have someone help you when you read instructions, pamphlets, or other written materials from your doctor or pharmacy?: 1 - Never  Diabetic?No  Interpreter Needed?: No  Information entered by :: Caroleen Hamman LPN   Activities of Daily Living In your present state of health, do you have any difficulty performing the following activities: 02/05/2021 02/05/2021  Hearing? N N  Vision? N N  Difficulty concentrating or making decisions? Y N  Comment occasionally -  Walking or climbing stairs? N N  Dressing or bathing? N N  Doing errands, shopping? N N  Preparing Food and eating ? N -  Using the Toilet? N -  In the past six months, have you accidently leaked urine? N -  Do you have problems with loss of bowel control? N -  Managing your Medications? N -  Managing your Finances? N -  Housekeeping or managing your Housekeeping? N -  Some recent data might be hidden     Patient Care Team: Midge Minium, MD as PCP - General (Family Medicine) Iran Planas, MD as Consulting Physician (Orthopedic Surgery) Suella Broad, MD as Consulting Physician (Physical Medicine and Rehabilitation) Melina Schools, MD as Consulting Physician (Orthopedic Surgery) Milus Banister, MD as Attending Physician (Gastroenterology) Kathrynn Ducking, MD as Consulting Physician (Neurology) Ahmed Prima Fransisco Hertz, PA-C as Physician Assistant (Physician Assistant) Ward Givens, NP as Registered Nurse (Gerontology) Lorretta Harp, PA-C as Physician Assistant (Orthopedic Surgery) Madelin Rear, A Rosie Place as Pharmacist (Pharmacist)  Indicate any recent Medical Services you may have received from other than [facility] providers in the past year (date may be approximate).     Assessment:   This is a routine wellness examination for [place].  Hearing/Vision screen  Hearing Screening   125Hz  250Hz  500Hz  1000Hz  2000Hz  3000Hz  4000Hz  6000Hz  8000Hz   Right ear:           Left ear:           Comments: No issues  Vision Screening Comments: Wears glasses Last eye exam-06/2020  Dietary issues and exercise activities discussed: Current Exercise Habits: Home exercise routine, Type of exercise: strength training/weights;stretching;walking, Time (Minutes): 30, Frequency (Times/Week): 7, Weekly Exercise (Minutes/Week): 210, Intensity: Mild, Exercise limited by: None identified  Goals    . Patient Stated     Eat healthier , drink more water & increase activity    . PharmD Care Plan     CARE PLAN ENTRY  Current Barriers:  . Chronic Disease Management support, education, and care coordination needs related to Hypertension, Depression, and Vitamin D deficiency   Hypertension . Pharmacist Clinical Goal(s): o Over the next 180 days, patient will work with PharmD and providers to maintain BP goal <130/80 . Current regimen:  o Controlled on diet and exercise . Interventions: o Continue current  management . Patient self care activities - Over the  next 180 days, patient will: o Check BP as directed, document, and provide at future appointments o Ensure daily salt intake < 2300 mg/day MDD . Pharmacist Clinical Goal(s) o Over the next 180 days, patient will work with PharmD and providers to ensure safe and effective therapy and minimize depressive symptoms  . Current regimen:  . Cymbalta 60 mg twice daily  . Bupropion XL 450 mg daily  . Interventions: o Continue current management o Referral for counseling . Patient self care activities - Over the next 180 days, patient will: o Continue current management  Vitamin D Deficiency . Pharmacist Clinical Goal(s) o Over the next 180 days, patient will work with PharmD and providers to ensure appropriate therapy  . Current regimen:  o Vitamin D 1000 units daily  . Interventions: o Continue current management . Patient self care activities - Over the next 180 days, patient will: o Continue current management  Medication management . Pharmacist Clinical Goal(s): o Over the next 180 days, patient will work with PharmD and providers to maintain optimal medication adherence . Current pharmacy: [facility]  . Interventions o Comprehensive medication review performed. o Continue current medication management strategy . Patient self care activities - Over the next 180 days, patient will: o Take medications as prescribed o Report any questions or concerns to PharmD and/or provider(s)  Initial goal documentation.      Depression Screen PHQ 2/9 Scores 02/05/2021 02/05/2021 09/19/2020 05/30/2020 04/05/2020 02/02/2020 08/18/2019  PHQ - 2 Score 0 0 0 4 0 2 0  PHQ- 9 Score - 0 0 16 0 2 0  Exception Documentation - - - - - - -  Some encounter information is confidential and restricted. Go to Review Flowsheets activity to see all data.    Fall Risk Fall Risk  02/05/2021 02/05/2021 09/19/2020 04/05/2020 02/02/2020  Falls in the past year? 1 0 0 0 1   Number falls in past yr: 1 0 0 0 1  Comment - - - - -  Injury with Fall? 0 0 0 0 0  Risk for fall due to : History of fall(s) No Fall Risks - - -  Risk for fall due to: Comment - - - - -  Follow up Falls prevention discussed - Falls evaluation completed Falls evaluation completed Falls evaluation completed    [place]:  Any stairs in or around the home? Yes  If so, are there any without handrails? No  Home free of loose throw rugs in walkways, pet beds, electrical cords, etc? Yes  Adequate lighting in your home to reduce risk of falls? Yes   ASSISTIVE DEVICES UTILIZED TO PREVENT FALLS:  Life alert? No  Use of a cane, walker or w/c? No  Grab bars in the bathroom? Yes  Shower chair or bench in shower? No  Elevated toilet seat or a handicapped toilet? No   TIMED UP AND GO:  Was the test performed? Yes .  Length of time to ambulate 10 feet: 9 sec.   Gait steady and fast without use of assistive device  Cognitive Function:Normal cognitive status assessed by direct observation by this Nurse Health Advisor. No abnormalities found.   MMSE - Mini Mental State Exam 02/24/2020 02/01/2019  Orientation to time 5 5  Orientation to Place 5 5  Registration 3 3  Attention/ Calculation 1 5  Recall 1 2  Language- name 2 objects 2 2  Language- repeat 1 0  Language- follow 3 step command  3 3  Language- read & follow direction 1 1  Write a sentence 1 1  Copy design 1 1  Total score 24 28     6CIT Screen 02/05/2021  What Year? 0 points  What month? 3 points  What time? 0 points  Count back from 20 0 points  Months in reverse 0 points  Repeat phrase 0 points  Total Score 3    Immunizations Immunization History  Administered Date(s) Administered  . Influenza,inj,Quad PF,6+ Mos 09/15/2014, 10/24/2015, 09/08/2017, 01/29/2019  . Influenza-Unspecified 12/25/2020  . PFIZER(Purple Top)SARS-COV-2 Vaccination 02/29/2020, 03/21/2020, 10/10/2020  . Tdap  11/05/2017    TDAP status: Up to date  Flu Vaccine status: Up to date  Pneumococcal vaccine status: Not yet indicated  Covid-19 vaccine status: Completed vaccines  Qualifies for Shingles Vaccine? Yes   Zostavax completed No   Shingrix Completed?: No.    Education has been provided regarding the importance of this vaccine. Patient has been advised to call insurance company to determine out of pocket expense if they have not yet received this vaccine. Advised may also receive vaccine at local pharmacy or Health Dept. Verbalized acceptance and understanding.  Screening Tests Health Maintenance  Topic Date Due  . Hepatitis C Screening  04/23/2021 (Originally 10-20-1956)  . MAMMOGRAM  02/28/2021  . COLONOSCOPY (Pts 45-28yrs Insurance coverage will need to be confirmed)  04/15/2021  . DEXA SCAN  04/21/2022  . TETANUS/TDAP  11/06/2027  . INFLUENZA VACCINE  Completed  . COVID-19 Vaccine  Completed  . HIV Screening  Completed    Health Maintenance  There are no preventive care reminders to display for this patient.  Colorectal cancer screening: Type of screening: Colonoscopy. Completed 04/15/2014. Repeat every 7 years  Mammogram status: Completed Bilateral 02/29/2020. Repeat every year  Bone Density status: Completed 04/21/2020. Results reflect: Bone density results: OSTEOPENIA. Repeat every 2 years.  Lung Cancer Screening: (Low Dose CT Chest recommended if Age 63-80 years, 30 pack-year currently smoking OR have quit w/in 15years.) does not qualify.     Additional Screening:  Hepatitis C Screening: does qualify; Discuss with PCP  Vision Screening: Recommended annual ophthalmology exams for early detection of glaucoma and other disorders of the eye. Is the patient up to date with their annual eye exam?  Yes  Who is the provider or what is the name of the office in which the patient attends annual eye exams? Unsure of name   Dental Screening: Recommended annual dental exams for  proper oral hygiene  Community Resource Referral / Chronic Care Management: CRR required this visit?  No   CCM required this visit?  No      Plan:     I have personally reviewed and noted the following in the patient's chart:   . Medical and social history . Use of alcohol, tobacco or illicit drugs  . Current medications and supplements . Functional ability and status . Nutritional status . Physical activity . Advanced directives . List of other physicians . Hospitalizations, surgeries, and ER visits in previous 12 months . Vitals . Screenings to include cognitive, depression, and falls . Referrals and appointments  In addition, I have reviewed and discussed with patient certain preventive protocols, quality metrics, and best practice recommendations. A written personalized care plan for preventive services as well as general preventive health recommendations were provided to patient.   Patient to access avs on mychart.  Marta Antu, LPN   4/31/5400  Nurse Health Advisor  Nurse Notes: None

## 2021-02-05 ENCOUNTER — Encounter: Payer: Self-pay | Admitting: Family Medicine

## 2021-02-05 ENCOUNTER — Ambulatory Visit (INDEPENDENT_AMBULATORY_CARE_PROVIDER_SITE_OTHER): Payer: Medicare Other

## 2021-02-05 ENCOUNTER — Ambulatory Visit (INDEPENDENT_AMBULATORY_CARE_PROVIDER_SITE_OTHER): Payer: Medicare Other | Admitting: Family Medicine

## 2021-02-05 ENCOUNTER — Other Ambulatory Visit: Payer: Self-pay

## 2021-02-05 VITALS — BP 117/80 | HR 74 | Temp 97.5°F | Resp 18 | Ht 66.0 in | Wt 212.6 lb

## 2021-02-05 VITALS — BP 117/80 | HR 74 | Temp 97.5°F | Resp 18 | Ht 66.0 in | Wt 212.0 lb

## 2021-02-05 DIAGNOSIS — L239 Allergic contact dermatitis, unspecified cause: Secondary | ICD-10-CM | POA: Diagnosis not present

## 2021-02-05 DIAGNOSIS — Z0000 Encounter for general adult medical examination without abnormal findings: Secondary | ICD-10-CM

## 2021-02-05 DIAGNOSIS — M797 Fibromyalgia: Secondary | ICD-10-CM

## 2021-02-05 DIAGNOSIS — E559 Vitamin D deficiency, unspecified: Secondary | ICD-10-CM | POA: Diagnosis not present

## 2021-02-05 DIAGNOSIS — F332 Major depressive disorder, recurrent severe without psychotic features: Secondary | ICD-10-CM

## 2021-02-05 DIAGNOSIS — I10 Essential (primary) hypertension: Secondary | ICD-10-CM | POA: Diagnosis not present

## 2021-02-05 LAB — CBC WITH DIFFERENTIAL/PLATELET
Basophils Absolute: 0 10*3/uL (ref 0.0–0.1)
Basophils Relative: 1.3 % (ref 0.0–3.0)
Eosinophils Absolute: 0.1 10*3/uL (ref 0.0–0.7)
Eosinophils Relative: 4.4 % (ref 0.0–5.0)
HCT: 38.9 % (ref 36.0–46.0)
Hemoglobin: 12.8 g/dL (ref 12.0–15.0)
Lymphocytes Relative: 22.5 % (ref 12.0–46.0)
Lymphs Abs: 0.7 10*3/uL (ref 0.7–4.0)
MCHC: 32.8 g/dL (ref 30.0–36.0)
MCV: 91.2 fl (ref 78.0–100.0)
Monocytes Absolute: 0.3 10*3/uL (ref 0.1–1.0)
Monocytes Relative: 9.8 % (ref 3.0–12.0)
Neutro Abs: 2.1 10*3/uL (ref 1.4–7.7)
Neutrophils Relative %: 62 % (ref 43.0–77.0)
Platelets: 227 10*3/uL (ref 150.0–400.0)
RBC: 4.27 Mil/uL (ref 3.87–5.11)
RDW: 13.1 % (ref 11.5–15.5)
WBC: 3.3 10*3/uL — ABNORMAL LOW (ref 4.0–10.5)

## 2021-02-05 MED ORDER — CLONAZEPAM 0.5 MG PO TABS: ORAL_TABLET | ORAL | 3 refills | Status: DC

## 2021-02-05 MED ORDER — TRIAMCINOLONE ACETONIDE 0.1 % EX OINT: 1.0000 "application " | TOPICAL_OINTMENT | Freq: Two times a day (BID) | CUTANEOUS | 1 refills | Status: AC

## 2021-02-05 NOTE — Patient Instructions (Signed)
Linda Buckley , Thank you for taking time to come for your Medicare Wellness Visit. I appreciate your ongoing commitment to your health goals. Please review the following plan we discussed and let me know if I can assist you in the future.   Screening recommendations/referrals: Colonoscopy: Completed 04/15/2014-Due 04/15/2021 Mammogram: Completed 02/29/2020-Due 02/28/2021 Bone Density: Completed 04/21/2020-. Due again at age 65 Recommended yearly ophthalmology/optometry visit for glaucoma screening and checkup Recommended yearly dental visit for hygiene and checkup  Vaccinations: Influenza vaccine: Up to date Pneumococcal vaccine: Not yet indicated Tdap vaccine: Up to date-Due-11/06/2027 Shingles vaccine: Discuss with pharmacy  Covid-19: Completed vaccines  Advanced directives: Information given today  Conditions/risks identified: See problem list  Next appointment: Follow up in one year for your annual wellness visit.   Preventive Care 40-64 Years, Female Preventive care refers to lifestyle choices and visits with your health care provider that can promote health and wellness. What does preventive care include?  A yearly physical exam. This is also called an annual well check.  Dental exams once or twice a year.  Routine eye exams. Ask your health care provider how often you should have your eyes checked.  Personal lifestyle choices, including:  Daily care of your teeth and gums.  Regular physical activity.  Eating a healthy diet.  Avoiding tobacco and drug use.  Limiting alcohol use.  Practicing safe sex.  Taking low-dose aspirin daily starting at age 39.  Taking vitamin and mineral supplements as recommended by your health care provider. What happens during an annual well check? The services and screenings done by your health care provider during your annual well check will depend on your age, overall health, lifestyle risk factors, and family history of  disease. Counseling  Your health care provider may ask you questions about your:  Alcohol use.  Tobacco use.  Drug use.  Emotional well-being.  Home and relationship well-being.  Sexual activity.  Eating habits.  Work and work Statistician.  Method of birth control.  Menstrual cycle.  Pregnancy history. Screening  You may have the following tests or measurements:  Height, weight, and BMI.  Blood pressure.  Lipid and cholesterol levels. These may be checked every 5 years, or more frequently if you are over 34 years old.  Skin check.  Lung cancer screening. You may have this screening every year starting at age 72 if you have a 30-pack-year history of smoking and currently smoke or have quit within the past 15 years.  Fecal occult blood test (FOBT) of the stool. You may have this test every year starting at age 22.  Flexible sigmoidoscopy or colonoscopy. You may have a sigmoidoscopy every 5 years or a colonoscopy every 10 years starting at age 70.  Hepatitis C blood test.  Hepatitis B blood test.  Sexually transmitted disease (STD) testing.  Diabetes screening. This is done by checking your blood sugar (glucose) after you have not eaten for a while (fasting). You may have this done every 1-3 years.  Mammogram. This may be done every 1-2 years. Talk to your health care provider about when you should start having regular mammograms. This may depend on whether you have a family history of breast cancer.  BRCA-related cancer screening. This may be done if you have a family history of breast, ovarian, tubal, or peritoneal cancers.  Pelvic exam and Pap test. This may be done every 3 years starting at age 9. Starting at age 64, this may be done every 5 years if you  have a Pap test in combination with an HPV test.  Bone density scan. This is done to screen for osteoporosis. You may have this scan if you are at high risk for osteoporosis. Discuss your test results,  treatment options, and if necessary, the need for more tests with your health care provider. Vaccines  Your health care provider may recommend certain vaccines, such as:  Influenza vaccine. This is recommended every year.  Tetanus, diphtheria, and acellular pertussis (Tdap, Td) vaccine. You may need a Td booster every 10 years.  Zoster vaccine. You may need this after age 38.  Pneumococcal 13-valent conjugate (PCV13) vaccine. You may need this if you have certain conditions and were not previously vaccinated.  Pneumococcal polysaccharide (PPSV23) vaccine. You may need one or two doses if you smoke cigarettes or if you have certain conditions. Talk to your health care provider about which screenings and vaccines you need and how often you need them. This information is not intended to replace advice given to you by your health care provider. Make sure you discuss any questions you have with your health care provider. Document Released: 12/29/2015 Document Revised: 08/21/2016 Document Reviewed: 10/03/2015 Elsevier Interactive Patient Education  2017 [place] Prevention in the Home Falls can cause injuries. They can happen to people of all ages. There are many things you can do to make your home safe and to help prevent falls. What can I do on the outside of my home?  Regularly fix the edges of walkways and driveways and fix any cracks.  Remove anything that might make you trip as you walk through a door, such as a raised step or threshold.  Trim any bushes or trees on the path to your home.  Use bright outdoor lighting.  Clear any walking paths of anything that might make someone trip, such as rocks or tools.  Regularly check to see if handrails are loose or broken. Make sure that both sides of any steps have handrails.  Any raised decks and porches should have guardrails on the edges.  Have any leaves, snow, or ice cleared regularly.  Use sand or salt on walking  paths during winter.  Clean up any spills in your garage right away. This includes oil or grease spills. What can I do in the bathroom?  Use night lights.  Install grab bars by the toilet and in the tub and shower. Do not use towel bars as grab bars.  Use non-skid mats or decals in the tub or shower.  If you need to sit down in the shower, use a plastic, non-slip stool.  Keep the floor dry. Clean up any water that spills on the floor as soon as it happens.  Remove soap buildup in the tub or shower regularly.  Attach bath mats securely with double-sided non-slip rug tape.  Do not have throw rugs and other things on the floor that can make you trip. What can I do in the bedroom?  Use night lights.  Make sure that you have a light by your bed that is easy to reach.  Do not use any sheets or blankets that are too big for your bed. They should not hang down onto the floor.  Have a firm chair that has side arms. You can use this for support while you get dressed.  Do not have throw rugs and other things on the floor that can make you trip. What can I do in the kitchen?  Clean up any spills right away.  Avoid walking on wet floors.  Keep items that you use a lot in easy-to-reach places.  If you need to reach something above you, use a strong step stool that has a grab bar.  Keep electrical cords out of the way.  Do not use floor polish or wax that makes floors slippery. If you must use wax, use non-skid floor wax.  Do not have throw rugs and other things on the floor that can make you trip. What can I do with my stairs?  Do not leave any items on the stairs.  Make sure that there are handrails on both sides of the stairs and use them. Fix handrails that are broken or loose. Make sure that handrails are as long as the stairways.  Check any carpeting to make sure that it is firmly attached to the stairs. Fix any carpet that is loose or worn.  Avoid having throw rugs at  the top or bottom of the stairs. If you do have throw rugs, attach them to the floor with carpet tape.  Make sure that you have a light switch at the top of the stairs and the bottom of the stairs. If you do not have them, ask someone to add them for you. What else can I do to help prevent falls?  Wear shoes that:  Do not have high heels.  Have rubber bottoms.  Are comfortable and fit you well.  Are closed at the toe. Do not wear sandals.  If you use a stepladder:  Make sure that it is fully opened. Do not climb a closed stepladder.  Make sure that both sides of the stepladder are locked into place.  Ask someone to hold it for you, if possible.  Clearly mark and make sure that you can see:  Any grab bars or handrails.  First and last steps.  Where the edge of each step is.  Use tools that help you move around (mobility aids) if they are needed. These include:  Canes.  Walkers.  Scooters.  Crutches.  Turn on the lights when you go into a dark area. Replace any light bulbs as soon as they burn out.  Set up your furniture so you have a clear path. Avoid moving your furniture around.  If any of your floors are uneven, fix them.  If there are any pets around you, be aware of where they are.  Review your medicines with your doctor. Some medicines can make you feel dizzy. This can increase your chance of falling. Ask your doctor what other things that you can do to help prevent falls. This information is not intended to replace advice given to you by your health care provider. Make sure you discuss any questions you have with your health care provider. Document Released: 09/28/2009 Document Revised: 05/09/2016 Document Reviewed: 01/06/2015 Elsevier Interactive Patient Education  2017 [employer].

## 2021-02-05 NOTE — Assessment & Plan Note (Signed)
Check labs and replete prn. 

## 2021-02-05 NOTE — Patient Instructions (Signed)
Follow up in 1 year or as needed We'll notify you of your lab results and make any changes if needed APPLY the Triamcinolone ointment twice daily Avoid soaps, lotions, sprays b/c this will irritate the area Continue to work on healthy diet and regular exercise- you can do it! Call with any questions or concerns Stay Safe!  Stay Healthy!

## 2021-02-05 NOTE — Assessment & Plan Note (Signed)
Pt has gained 11 lbs and BMI is again >34 at 34.31  Given her hyperlipidemia, HTN, and OSA this qualifies as morbid obesity.  Encouraged pt to work on [employer] and regular exercise.  Check labs to risk stratify.  Will follow.

## 2021-02-05 NOTE — Progress Notes (Signed)
Subjective:    Patient ID: Linda Buckley, female    DOB: Aug 10, 1956, 65 y.o.   MRN: [id number]  HPI CPE- UTD on mammo, colonoscopy (due later this year), BMD.  UTD on Tdap, flu, COVID.  Reviewed past medical, surgical, family and social histories.   Patient Care Team    Relationship Specialty Notifications Start End  Midge Minium, MD PCP - General Family Medicine  01/12/14   Iran Planas, MD Consulting Physician Orthopedic Surgery  10/23/15   Suella Broad, MD Consulting Physician Physical Medicine and Rehabilitation  10/23/15   Melina Schools, MD Consulting Physician Orthopedic Surgery  04/03/16   Milus Banister, MD Attending Physician Gastroenterology  11/05/17   Kathrynn Ducking, MD Consulting Physician Neurology  11/05/17   Ahmed Prima Fransisco Hertz, PA-C Physician Assistant Physician Assistant  11/05/17   Ward Givens, NP Registered Nurse Gerontology  11/05/17   Lorretta Harp, PA-C Physician Assistant Orthopedic Surgery  12/01/17   Madelin Rear, [facility] Pharmacist Pharmacist  04/14/20    Comment: PHONE NUMBER [phone]    Health Maintenance  Topic Date Due  . Hepatitis C Screening  04/23/2021 (Originally 01-Feb-1956)  . MAMMOGRAM  02/28/2021  . COLONOSCOPY (Pts 45-22yrs Insurance coverage will need to be confirmed)  04/15/2021  . DEXA SCAN  04/21/2022  . TETANUS/TDAP  11/06/2027  . INFLUENZA VACCINE  Completed  . COVID-19 Vaccine  Completed  . HIV Screening  Completed      Review of Systems Patient reports no vision/ hearing changes, adenopathy,fever, persistant/recurrent hoarseness , swallowing issues, chest pain, palpitations, edema, persistant/recurrent cough, hemoptysis, dyspnea (rest/exertional/paroxysmal nocturnal), gastrointestinal bleeding (melena, rectal bleeding), abdominal pain, significant heartburn, bowel changes, GU symptoms (dysuria, hematuria, incontinence), Gyn symptoms (abnormal  bleeding, pain),  syncope, focal weakness, memory loss, numbness &  tingling, hair/nail changes, abnormal bruising or bleeding, anxiety, or depression.   + 11 lb weight gain + rash on anterior chest- started on 2/13 after applying bath and body works spray.  Since then sxs have waxed and waned but primarily itch, ooze, burns. Fibromyalgia- pt has weaned herself off all pain medication and her cymbalta.  Reports feeling better.  Asking to restart the Clonazepam for sleep  This visit occurred during the SARS-CoV-2 public health emergency.  Safety protocols were in place, including screening questions prior to the visit, additional usage of staff PPE, and extensive cleaning of exam room while observing appropriate contact time as indicated for disinfecting solutions.       Objective:   Physical Exam General Appearance:    Alert, cooperative, no distress, appears stated age, obese  Head:    Normocephalic, without obvious abnormality, atraumatic  Eyes:    PERRL, conjunctiva/corneas clear, EOM's intact, fundi    benign, both eyes  Ears:    Normal TM's and external ear canals, both ears  Nose:   Deferred due to COVID  Throat:   Neck:   Supple, symmetrical, trachea midline, no adenopathy;    Thyroid: no enlargement/tenderness/nodules  Back:     Symmetric, no curvature, ROM normal, no CVA tenderness  Lungs:     Clear to auscultation bilaterally, respirations unlabored  Chest Wall:    No tenderness or deformity   Heart:    Regular rate and rhythm, S1 and S2 normal, no murmur, rub   or gallop  Breast Exam:    Deferred to mammo  Abdomen:     Soft, non-tender, bowel sounds active all four quadrants,    no masses, no organomegaly  Genitalia:  Deferred  Rectal:    Extremities:   Extremities normal, atraumatic, no cyanosis or edema  Pulses:   2+ and symmetric all extremities  Skin:   Skin color, texture, turgor normal, allergic dermatitis on anterior chest  Lymph nodes:   Cervical, supraclavicular, and axillary nodes normal  Neurologic:   CNII-XII intact, normal  strength, sensation and reflexes    throughout          Assessment & Plan:  Allergic dermatitis- new.  Reviewed dx w/ pt and will start topical Triamcinolone ointment BID.  She is to avoid other products at this time.  Pt expressed understanding and is in agreement w/ plan.

## 2021-02-05 NOTE — Assessment & Plan Note (Signed)
Pt's PE WNL w/ exception of obesity and allergic dermatitis.  UTD on mammo, colonoscopy, immunizations.  Check labs.  Anticipatory guidance provided.

## 2021-02-05 NOTE — Assessment & Plan Note (Signed)
Ongoing issue.  Pt is doing well on her Wellbutrin at this time.  No med changes.  Will follow.

## 2021-02-05 NOTE — Assessment & Plan Note (Signed)
Improving.  Has weaned herself off all narcotic pain medication and has stopped her cymbalta.  Asking to restart her Clonazepam to help w/ sleep.  Prescription sent.  Will follow.

## 2021-02-06 LAB — BASIC METABOLIC PANEL
BUN: 15 mg/dL (ref 6–23)
CO2: 29 mEq/L (ref 19–32)
Calcium: 10.1 mg/dL (ref 8.4–10.5)
Chloride: 104 mEq/L (ref 96–112)
Creatinine, Ser: 0.98 mg/dL (ref 0.40–1.20)
GFR: 61.94 mL/min (ref >60.00)
Glucose, Bld: 51 mg/dL — ABNORMAL LOW (ref 70–99)
Potassium: 3.7 mEq/L (ref 3.5–5.1)
Sodium: 141 mEq/L (ref 135–145)

## 2021-02-06 LAB — HEPATIC FUNCTION PANEL
ALT: 16 U/L (ref 0–35)
AST: 20 U/L (ref 0–37)
Albumin: 4.1 g/dL (ref 3.5–5.2)
Alkaline Phosphatase: 63 U/L (ref 39–117)
Bilirubin, Direct: 0.1 mg/dL (ref 0.0–0.3)
Total Bilirubin: 0.5 mg/dL (ref 0.2–1.2)
Total Protein: 7 g/dL (ref 6.0–8.3)

## 2021-02-06 LAB — VITAMIN D 25 HYDROXY (VIT D DEFICIENCY, FRACTURES): VITD: 50.35 ng/mL (ref 30.00–100.00)

## 2021-02-06 LAB — LIPID PANEL
Cholesterol: 172 mg/dL (ref 0–200)
HDL: 66.2 mg/dL (ref >39.00)
LDL Cholesterol: 91 mg/dL (ref 0–99)
NonHDL: 105.7
Total CHOL/HDL Ratio: 3
Triglycerides: 73 mg/dL (ref 0.0–149.0)
VLDL: 14.6 mg/dL (ref 0.0–40.0)

## 2021-02-06 LAB — TSH: TSH: 1.23 u[IU]/mL (ref 0.35–4.50)

## 2021-02-08 ENCOUNTER — Ambulatory Visit: Payer: Medicare Other | Admitting: Adult Health

## 2021-02-08 ENCOUNTER — Other Ambulatory Visit: Payer: Self-pay

## 2021-02-08 ENCOUNTER — Ambulatory Visit: Payer: Medicare Other | Admitting: Neurology

## 2021-02-08 ENCOUNTER — Encounter: Payer: Self-pay | Admitting: Adult Health

## 2021-02-08 VITALS — BP 127/82 | HR 70 | Ht 66.0 in | Wt 213.0 lb

## 2021-02-08 DIAGNOSIS — G4733 Obstructive sleep apnea (adult) (pediatric): Secondary | ICD-10-CM

## 2021-02-08 DIAGNOSIS — Z9989 Dependence on other enabling machines and devices: Secondary | ICD-10-CM

## 2021-02-08 NOTE — Progress Notes (Signed)
PATIENT: Linda Buckley DOB: [birth year]  REASON FOR VISIT: follow up HISTORY FROM: patient  HISTORY OF PRESENT ILLNESS: Today 02/08/21:  Ms. Dugue is a 65 year old female with a history of sleep apnea on CPAP.  She reports that the CPAP works well for her.  She does need new supplies.  She would like a new machine but is not due till December.  She also reports that the mass that she has is uncomfortable.  She would like to try a different style.  Compliance Report Compliance  Usage 01/09/2021 - 02/07/2021 Usage days 26/30 days (87%) >= 4 hours 25 days (83%) Average usage (days used) 6 hours 50 minutes  AirSense 10 AutoSet For Her Serial number [id number] Mode AutoSet for Her Min Pressure 6 cmH2O Max Pressure 14 cmH2O EPR Fulltime EPR level 3  Therapy Pressure - cmH2O Median: 8.8 95th percentile: 11.2 Maximum: 11.9 Leaks - L/min Median: 2.0 95th percentile: 3.9 Maximum: 10.6 Events per hour AI: 3.7 HI: 0.2 AHI: 3.9 Apnea Index Central: 2.2 Obstructive: 1.4 Unknown: 0.0  HISTORY Linda Moffat Robinsonis a 65 year old African American female patientand was seenon 02/01/2020 in a face -to-face visit.  I have the pleasure of seeing Linda Buckley today,a right -handed Black or [place] American female with a medical history of Allergy, Anxiety, Arthritis, Carpal tunnel syndrome of right wrist, Depression, Dysphagia (09/02/2017), Edema (09/02/2017), Environmental allergies(04/2016), Headache, Hereditary and idiopathic peripheral neuropathy (04/03/2016), History of adenomatous polyp of colon, History of gastric ulcer, History of kidney stones, Hypertension, Pneumonia, Sleep apnea, and Wears glasses. The patient had a gastric sleeve procedure on 11-01-2018 , since my last visit with her. Her BMI is now at 30 kg/m2.  The patient underwent a home sleep test on 16 June 2019 with a goal to establish where her sleep apnea was after she had begun to lose weight following her gastric  sleeve procedure. The patient was already using CPAP at the time and was referred by Vaughan Browner, Nurse Practitioner following a virtual visit from 05-24-2019.  The home sleep test identified very mild sleep apnea with an AHI of 9.7/h, still being exacerbated during REM sleep to an AHI of 21.9/h.  There was normal heart rate variability , no prolonged hypoxemia, and only moderate snoring noted. The best treatment was still CPAP and I ordered an autotitrating  CPAP device for her, the pressure range was limited between 6 and 14 cmH2O pressure with 3 cm EPR, plus I ordered an interface of her choice.  I also recommended to avoid the supine sleep position.  The home sleep study results were based a total sleep time estimated to be 7 hours, 54 minutes.  The patient continues to report an Epworth score of 15/24 points which indicates persistent hypersomnia.  She has been 60% compliant with her CPAP use over 4 hours daily, average use at time is 4 hours 4 hours 51 minutes, she is using an AutoSet with a 6 cm minimum pressure of 14 cm maximum pressure and 3 cm EPR she has 2.6 central apneas residually and 1.2 obstructive apneas with a total AHI of 4.2.  No Cheyne-Stokes respiration is noted and the 95th percentile pressure is 9.7 cm well was in the current setting.  Her usage dropped after 13 January 2020- she reports to have had such leg pains that she needed to walk and couldn't use CPAP.  She ran out of trazodone - and now is deemed non-compliant.  She has seen her "  back pain specialist "-  At [facility], [place].  She is followed at [facility] by Dr Jannifer Franklin, MD- she reports for "Fibromyalgia".  REVIEW OF SYSTEMS: Out of a complete 14 system review of symptoms, the patient complains only of the following symptoms, and all other reviewed systems are negative.  FSS 57 ESS 13  ALLERGIES: Allergies  Allergen Reactions  . Aspirin Swelling    Sweating/ swelling of hands and face   . Bee Venom Swelling    Swelling at  site     HOME MEDICATIONS: Outpatient Medications Prior to Visit  Medication Sig Dispense Refill  . albuterol (VENTOLIN HFA) 108 (90 Base) MCG/ACT inhaler INHALE 2 PUFFS BY MOUTH EVERY 4 HOURS AS NEEDED FOR SHORTNESS OF BREATH 36 g 0  . buPROPion (WELLBUTRIN XL) 150 MG 24 hr tablet TAKE 1 TABLET BY MOUTH  DAILY 90 tablet 3  . buPROPion (WELLBUTRIN XL) 300 MG 24 hr tablet TAKE 1 TABLET BY MOUTH IN  THE MORNING 90 tablet 1  . cetirizine (ZYRTEC) 10 MG tablet TAKE 1 TABLET(10 MG) BY MOUTH DAILY 30 tablet 0  . Cholecalciferol (VITAMIN D PO) Take 1 tablet by mouth daily. 1000units    . clonazePAM (KLONOPIN) 0.5 MG tablet TAKE 1 TABLET(0.5 MG) BY MOUTH AT BEDTIME 30 tablet 3  . diclofenac Sodium (VOLTAREN) 1 % GEL Apply 4 g topically 4 (four) times daily. 100 g 3  . Multiple Vitamin (MULTIVITAMIN) capsule Take 1 capsule by mouth daily. With iron    . pregabalin (LYRICA) 100 MG capsule One capsule in the morning and 2 in the evening 270 capsule 1  . triamcinolone ointment (KENALOG) 0.1 % Apply 1 application topically 2 (two) times daily. 90 g 1   No facility-administered medications prior to visit.    PAST MEDICAL HISTORY: Past Medical History:  Diagnosis Date  . Allergy   . Anxiety   . Arthritis   . Carpal tunnel syndrome of right wrist    RECURRENT  . Depression   . Dysphagia 09/02/2017  . Edema 09/02/2017  . Environmental allergies    dust mites, pollen, roaches and other insects, mold  . Fibromyalgia 04/2016  . Headache    migraines  . Hereditary and idiopathic peripheral neuropathy 04/03/2016  . History of adenomatous polyp of colon   . History of gastric ulcer   . History of kidney stones   . Hypertension   . Pneumonia   . Sleep apnea    wears CPAP  . Wears glasses     PAST SURGICAL HISTORY: Past Surgical History:  Procedure Laterality Date  . CARPAL TUNNEL RELEASE Right 2007  . CARPAL TUNNEL RELEASE Right 07/05/2015   Procedure: RIGHT HAND REVISION CARPAL TUNNEL  RELEASE;  Surgeon: Iran Planas, MD;  Location: [facility];  Service: Orthopedics;  Laterality: Right;  . CARPAL TUNNEL RELEASE Left 09/2016  . CERVICAL DISC ARTHROPLASTY  03/2015  . COLONOSCOPY W/ POLYPECTOMY  04-15-2014  . CYSTO/  RIGHT RETROGRADE PYELOGRAM/ URETEROSCOPY STONE EXTRACTION/  STENT PLACEMENT  06-07-2010  . FINGER ARTHRODESIS Right 08/10/2018   Procedure: RIGHT THUMB ARTHRODESIS;  Surgeon: Milly Jakob, MD;  Location: [facility];  Service: Orthopedics;  Laterality: Right;  . LAPAROSCOPIC ASSISTED VAGINAL HYSTERECTOMY  04-25-2004   ovaries remain  . LAPAROSCOPIC GASTRIC BAND REMOVAL WITH LAPAROSCOPIC GASTRIC SLEEVE RESECTION  10/26/2018  . TUBAL LIGATION  1982    FAMILY HISTORY: Family History  Problem Relation Age of Onset  . Asthma Father   . Diabetes Father   .  Arthritis Mother   . Hypertension Mother   . Diabetes Mother   . Cancer Mother   . Stomach cancer Mother   . Diabetes Brother   . Diabetes Brother   . Asthma Son   . Alcohol abuse Son   . Anxiety disorder Son   . Schizophrenia Son   . Cancer Maternal Grandfather        colon  . Colon cancer Maternal Grandfather   . Cancer Maternal Aunt        breast  . Cancer Maternal Uncle        lung  . Colon cancer Maternal Uncle   . Cancer Maternal Grandmother        ovarian  . Diabetes Son     SOCIAL HISTORY: Social History   Socioeconomic History  . Marital status: Married    Spouse name: Not on file  . Number of children: 2  . Years of education: 2 yrs college  . Highest education level: Not on file  Occupational History  . 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 Smoking status: Never Smoker  . Smokeless tobacco: Never Used  Vaping Use  . Vaping Use: Never used  Substance and Sexual Activity  . Alcohol use: Not Currently    Comment: 2 glasses of wine weekly - reports none in 2 years  . Drug use: No    Comment: prescription for klonopin and oxy IR  .  Sexual activity: Yes    Partners: Male    Birth control/protection: None    Comment: partial hysterectomy  Other Topics Concern  . Not on file  Social History Narrative   Lives at home w/ her husband   Right-handed   Drinks 2 cups of coffee weekly    3 bottle water per day   Social Determinants of Health   Financial Resource Strain: Low Risk   . Difficulty of Paying Living Expenses: Not very hard  Food Insecurity: No Food Insecurity  . Worried About Charity fundraiser in the Last Year: Never true  . Ran Out of Food in the Last Year: Never true  Transportation Needs: No Transportation Needs  . Lack of Transportation (Medical): No  . Lack of Transportation (Non-Medical): No  Physical Activity: Sufficiently Active  . Days of Exercise per Week: 7 days  . Minutes of Exercise per Session: 30 min  Stress: No Stress Concern Present  . Feeling of Stress : Not at all  Social Connections: Moderately Integrated  . Frequency of Communication with Friends and Family: More than three times a week  . Frequency of Social Gatherings with Friends and Family: More than three times a week  . Attends Religious Services: 1 to 4 times per year  . Active Member of Clubs or Organizations: No  . Attends Archivist Meetings: Never  . Marital Status: Married  Human resources officer Violence: Not At Risk  . Fear of Current or Ex-Partner: No  . Emotionally Abused: No  . Physically Abused: No  . Sexually Abused: No      PHYSICAL EXAM  Vitals:   02/08/21 1127  BP: 127/82  Pulse: 70  Weight: 213 lb (96.6 kg)  Height: 5\' 6"  (1.676 m)   Body mass index is 34.38 kg/m.  Generalized: Well developed, in no acute distress  Chest: Lungs clear to auscultation bilaterally  Neurological examination  Mentation: Alert oriented to time, place, history taking. Follows all commands speech and language  fluent Cranial nerve II-XII: Extraocular movements were full, visual field were full on  confrontational test Head turning and shoulder shrug  were normal and symmetric. Motor: The motor testing reveals 5 over 5 strength of all 4 extremities. Good symmetric motor tone is noted throughout.  Sensory: Sensory testing is intact to soft touch on all 4 extremities. No evidence of extinction is noted.  Gait and station: Gait is normal.    DIAGNOSTIC DATA (LABS, IMAGING, TESTING) - I reviewed patient records, labs, notes, testing and imaging myself where available.  Lab Results  Component Value Date   WBC 3.3 (L) 02/05/2021   HGB 12.8 02/05/2021   HCT 38.9 02/05/2021   MCV 91.2 02/05/2021   PLT 227.0 02/05/2021      Component Value Date/Time   NA 141 02/05/2021 1026   K 3.7 02/05/2021 1026   CL 104 02/05/2021 1026   CO2 29 02/05/2021 1026   GLUCOSE 51 (L) 02/05/2021 1026   BUN 15 02/05/2021 1026   CREATININE 0.98 02/05/2021 1026   CREATININE 0.78 04/22/2016 1454   CALCIUM 10.1 02/05/2021 1026   PROT 7.0 02/05/2021 1026   PROT 6.6 04/03/2016 1018   ALBUMIN 4.1 02/05/2021 1026   AST 20 02/05/2021 1026   ALT 16 02/05/2021 1026   ALKPHOS 63 02/05/2021 1026   BILITOT 0.5 02/05/2021 1026   GFRNONAA 54 (L) 08/04/2018 1230   GFRAA >60 08/04/2018 1230   Lab Results  Component Value Date   CHOL 172 02/05/2021   HDL 66.20 02/05/2021   LDLCALC 91 02/05/2021   TRIG 73.0 02/05/2021   CHOLHDL 3 02/05/2021   Lab Results  Component Value Date   HGBA1C 6.0 11/05/2017   Lab Results  Component Value Date   VITAMINB12 420 02/01/2019   Lab Results  Component Value Date   TSH 1.23 02/05/2021      ASSESSMENT AND PLAN 65 y.o. year old female  has a past medical history of Allergy, Anxiety, Arthritis, Carpal tunnel syndrome of right wrist, Depression, Dysphagia (09/02/2017), Edema (09/02/2017), Environmental allergies, Fibromyalgia (04/2016), Headache, Hereditary and idiopathic peripheral neuropathy (04/03/2016), History of adenomatous polyp of colon, History of gastric ulcer,  History of kidney stones, Hypertension, Pneumonia, Sleep apnea, and Wears glasses. here with:  1. OSA on CPAP  - CPAP compliance excellent - Good treatment of AHI  - Encourage patient to use CPAP nightly and > 4 hours each night -Order sent for mask refitting - F/U in 1 year or sooner if needed   I spent 20 minutes of face-to-face and non-face-to-face time with patient.  This included previsit chart review, lab review, study review, order entry, electronic health record documentation, patient education.  Ward Givens, MSN, NP-C 02/08/2021, 11:01 AM [facility] [address] [place], [place] [address] [phone]

## 2021-02-08 NOTE — Patient Instructions (Signed)

## 2021-02-08 NOTE — Progress Notes (Signed)
Cm message sent to [facility]  

## 2021-02-09 ENCOUNTER — Telehealth: Payer: Self-pay

## 2021-02-09 NOTE — Chronic Care Management (AMB) (Signed)
    Chronic Care Management Pharmacy Assistant   Name: Linda Buckley  MRN: [id number] DOB: February 08, 1956  Reason for Encounter: Chart Review  PCP : Midge Minium, MD  Allergies:   Allergies  Allergen Reactions  . Aspirin Swelling    Sweating/ swelling of hands and face   . Bee Venom Swelling    Swelling at site     Medications: Outpatient Encounter Medications as of 02/09/2021  Medication Sig  . albuterol (VENTOLIN HFA) 108 (90 Base) MCG/ACT inhaler INHALE 2 PUFFS BY MOUTH EVERY 4 HOURS AS NEEDED FOR SHORTNESS OF BREATH  . buPROPion (WELLBUTRIN XL) 150 MG 24 hr tablet TAKE 1 TABLET BY MOUTH  DAILY  . buPROPion (WELLBUTRIN XL) 300 MG 24 hr tablet TAKE 1 TABLET BY MOUTH IN  THE MORNING  . cetirizine (ZYRTEC) 10 MG tablet TAKE 1 TABLET(10 MG) BY MOUTH DAILY  . Cholecalciferol (VITAMIN D PO) Take 1 tablet by mouth daily. 1000units  . clonazePAM (KLONOPIN) 0.5 MG tablet TAKE 1 TABLET(0.5 MG) BY MOUTH AT BEDTIME  . diclofenac Sodium (VOLTAREN) 1 % GEL Apply 4 g topically 4 (four) times daily.  . Multiple Vitamin (MULTIVITAMIN) capsule Take 1 capsule by mouth daily. With iron  . pregabalin (LYRICA) 100 MG capsule One capsule in the morning and 2 in the evening  . triamcinolone ointment (KENALOG) 0.1 % Apply 1 application topically 2 (two) times daily.   No facility-administered encounter medications on file as of 02/09/2021.    Current Diagnosis: Patient Active Problem List   Diagnosis Date Noted  . Poor compliance with CPAP treatment 02/01/2020  . OSA on CPAP 02/01/2020  . Chronic left shoulder pain 09/17/2019  . MDD (major depressive disorder), recurrent episode, severe ([place]) 05/05/2018  . Vitamin D deficiency 11/05/2017  . Dysphagia 09/02/2017  . Multiple falls 09/04/2016  . Hereditary and idiopathic peripheral neuropathy 04/03/2016  . Exertional shortness of breath 01/26/2016  . Left leg pain 01/26/2016  . Fibromyalgia 07/28/2015  . Right wrist pain 04/05/2015  .  Physical exam 03/01/2014  . HTN (hypertension) 01/12/2014  . Morbid obesity ([place]) 01/12/2014  . Pre-diabetes 01/12/2014  . Family history of lupus erythematosus 01/12/2014  . Fatigue 10/17/2013    Reviewed chart for medication changes.  02/05/2021 OV PCP Dr. Birdie Riddle; annual physical, allergic dermatitis-new apply triamcinolone ointment twice daily, restart clonazepam to help with sleep, pt gaomed 11lbs and BMI is again >34.  02/08/2021 OV (neurology) Ernestine Mcmurray, NP; Epworth score of 15/24 which indicates hypersomnia, use CPAP nightly >4 hours each night, f/u 1 year  Future Appointments  Date Time Provider [place]  11/12/2021 10:30 AM Ward Givens, NP [facility] None  02/11/2022  9:00 AM [facility]  02/11/2022 10:00 AM Midge Minium, MD [facility]    April D Calhoun, [place] Pharmacist Assistant [phone]   Follow-Up:  Pharmacist Review

## 2021-02-23 ENCOUNTER — Telehealth: Payer: Self-pay | Admitting: Adult Health

## 2021-02-23 NOTE — Telephone Encounter (Signed)
Pt called, last office visit NP was going to reach to [facility] about scheduling a mask fitting. Mask I have is too tight, makes my face swell. Wanted to let her know [facility] has not contacted me to schedule a mask fitting Would like a call from the nurse.

## 2021-02-26 NOTE — Telephone Encounter (Signed)
Jacquelyne Balint, RN We spoke w/ pt on 02/23/21 - we had not gotten orders for a new mask fitting, and pt had not contacted [name] about it. Pt has an outstanding balance that needs to be addressed before we can provide additional equipment. Pt stated that she will reach out to the billing dept to get this handled so we can schedule a fitting.   Thank you!      Previous Messages   ----- Message -----  From: Brandon Melnick, RN  Sent: 02/26/2021 10:28 AM EDT  To: Mady Gemma  Subject: cpap mask                     Pt contacted [name] about   "Pt called, last office visit NP was going to reach to [facility] about scheduling a mask fitting. Mask I have is too tight, makes my face swell. Wanted to let her know [facility] has not contacted me to schedule a mask fitting."    Manie R. [facility]  Female, 65 y.o., 24-Apr-1956   MRN:  [id number]  Thank you, SY/GNA

## 2021-02-26 NOTE — Telephone Encounter (Signed)
Sent CM to [employer] about  DME order.

## 2021-03-08 ENCOUNTER — Ambulatory Visit: Payer: Medicare Other | Admitting: Neurology

## 2021-05-09 DIAGNOSIS — I10 Essential (primary) hypertension: Secondary | ICD-10-CM | POA: Diagnosis not present

## 2021-05-09 DIAGNOSIS — Z903 Acquired absence of stomach [part of]: Secondary | ICD-10-CM | POA: Diagnosis not present

## 2021-05-09 DIAGNOSIS — Z131 Encounter for screening for diabetes mellitus: Secondary | ICD-10-CM | POA: Diagnosis not present

## 2021-05-09 DIAGNOSIS — R7303 Prediabetes: Secondary | ICD-10-CM | POA: Diagnosis not present

## 2021-05-09 DIAGNOSIS — E559 Vitamin D deficiency, unspecified: Secondary | ICD-10-CM | POA: Diagnosis not present

## 2021-05-09 DIAGNOSIS — G4733 Obstructive sleep apnea (adult) (pediatric): Secondary | ICD-10-CM | POA: Diagnosis not present

## 2021-05-11 DIAGNOSIS — G4733 Obstructive sleep apnea (adult) (pediatric): Secondary | ICD-10-CM | POA: Diagnosis not present

## 2021-05-21 ENCOUNTER — Other Ambulatory Visit: Payer: Self-pay

## 2021-05-21 ENCOUNTER — Encounter: Payer: Self-pay | Admitting: Family Medicine

## 2021-05-21 ENCOUNTER — Ambulatory Visit (INDEPENDENT_AMBULATORY_CARE_PROVIDER_SITE_OTHER): Payer: Medicare Other | Admitting: Family Medicine

## 2021-05-21 VITALS — BP 115/80 | HR 78 | Temp 98.6°F | Resp 20 | Ht 66.0 in | Wt 213.2 lb

## 2021-05-21 DIAGNOSIS — M25551 Pain in right hip: Secondary | ICD-10-CM

## 2021-05-21 DIAGNOSIS — F411 Generalized anxiety disorder: Secondary | ICD-10-CM | POA: Diagnosis not present

## 2021-05-21 MED ORDER — HYDROCODONE-ACETAMINOPHEN 5-325 MG PO TABS: 1.0000 | ORAL_TABLET | Freq: Four times a day (QID) | ORAL | 0 refills | Status: DC | PRN

## 2021-05-21 MED ORDER — BUSPIRONE HCL 7.5 MG PO TABS: 7.5000 mg | ORAL_TABLET | Freq: Two times a day (BID) | ORAL | 3 refills | Status: DC

## 2021-05-21 MED ORDER — MELOXICAM 15 MG PO TABS: 15.0000 mg | ORAL_TABLET | Freq: Every day | ORAL | 1 refills | Status: DC

## 2021-05-21 NOTE — Patient Instructions (Signed)
Follow up in 3-4 weeks to recheck anxiety We'll call you with your orthopedic appt START the Meloxicam once daily- take w/ food USE the Hydrocodone only as needed for severe pain START the Buspirone twice daily for anxiety Call with any questions or concerns Hang in there!

## 2021-05-21 NOTE — Progress Notes (Signed)
   Subjective:    Patient ID: Linda Buckley, female    DOB: 1956-09-17, 65 y.o.   MRN: [id number]  HPI Hip pain- R side, 'it just aches so bad'  Pt reports she has fallen 2-3x due to pain.  Will have tingling down front of leg to just below the knee.  Pain originates in the groin.  sxs started 1-2 months ago but have dramatically worsened over the last 3-4 weeks.  No known injury.  Pain will come and go.  No particular movements trigger pain.  Taking tylenol w/o relief.  Currently on Lyrica.    Anxiety- chronic problem, pt on Wellbutrin 450mg  daily and Klonopin prn.  'it gets so much.  I get so stressed out'.  Son just went to rehab.  Having some issues in her marriage- pt feels safe at home.   Review of Systems For ROS see HPI   This visit occurred during the SARS-CoV-2 public health emergency.  Safety protocols were in place, including screening questions prior to the visit, additional usage of staff PPE, and extensive cleaning of exam room while observing appropriate contact time as indicated for disinfecting solutions.       Objective:   Physical Exam Vitals reviewed.  Constitutional:      General: She is not in acute distress.    Appearance: Normal appearance. She is obese. She is not ill-appearing.  HENT:     Head: Normocephalic and atraumatic.  Cardiovascular:     Pulses: Normal pulses.  Musculoskeletal:     Comments: Good flexion/extension of R hip, good external rotation, very painful w/ internal rotation  Skin:    General: Skin is warm and dry.  Neurological:     General: No focal deficit present.     Mental Status: She is alert and oriented to person, place, and time.  Psychiatric:        Mood and Affect: Mood normal.        Behavior: Behavior normal.        Thought Content: Thought content normal.           Assessment & Plan:  R hip pain- new.  PE limited by body habitus but obvious pain w/ internal rotation of R hip.  Given that her pain is truly in her  groin and runs down the front of her leg, will refer to ortho.  Start Meloxicam once daily and hydrocodone given to use for severe pain.  Pt expressed understanding and is in agreement w/ plan.   Anxiety- deteriorated.  Pt's son just went to rehab which is very hard for her.  She reports difficulty in her marriage but thankfully still feels safe at home.  She is maxed out on Wellbutrin but we will add Buspar to help control her sxs.  Pt expressed understanding and is in agreement w/ plan.

## 2021-06-05 ENCOUNTER — Telehealth: Payer: Self-pay | Admitting: Family Medicine

## 2021-06-05 NOTE — Telephone Encounter (Signed)
.  Medication Refills  Last OV:  Medication:  Hydrocodone  Pharmacy:[facility] in [place]  Let patient know to contact pharmacy at the end of the day to make sure medication is ready.   Please notify patient to allow 48-72 hours to process.  Encourage patient to contact the pharmacy for refills or they can request refills through [facility] out below:   Last refill:  QTY:  Refill Date:    Other Comments: Patient states that she only has 5 tablets left and she is having to take at least 2 a day  Okay for refill?  Please advise.

## 2021-06-05 NOTE — Telephone Encounter (Signed)
Requesting:Hydrocodone 5-325mg  Contract: UDS: Last Visit:05/21/21 Next Visit:06/22/21 Last Refill:05/21/21 20 tabs 0 refills  Please Advise

## 2021-06-06 MED ORDER — HYDROCODONE-ACETAMINOPHEN 5-325 MG PO TABS: 1.0000 | ORAL_TABLET | Freq: Four times a day (QID) | ORAL | 0 refills | Status: DC | PRN

## 2021-06-06 NOTE — Telephone Encounter (Signed)
Prescription filled at pt's request ?

## 2021-06-06 NOTE — Addendum Note (Signed)
Addended by: Midge Minium on: 06/06/2021 10:30 AM   Modules accepted: Orders

## 2021-06-13 ENCOUNTER — Encounter: Payer: Self-pay | Admitting: *Deleted

## 2021-06-22 ENCOUNTER — Ambulatory Visit (INDEPENDENT_AMBULATORY_CARE_PROVIDER_SITE_OTHER): Payer: Medicare Other | Admitting: Family Medicine

## 2021-06-22 ENCOUNTER — Other Ambulatory Visit: Payer: Self-pay

## 2021-06-22 ENCOUNTER — Encounter: Payer: Self-pay | Admitting: Family Medicine

## 2021-06-22 VITALS — BP 120/80 | HR 66 | Temp 98.1°F | Resp 18 | Ht 66.0 in | Wt 212.0 lb

## 2021-06-22 DIAGNOSIS — M5410 Radiculopathy, site unspecified: Secondary | ICD-10-CM

## 2021-06-22 DIAGNOSIS — F411 Generalized anxiety disorder: Secondary | ICD-10-CM | POA: Diagnosis not present

## 2021-06-22 DIAGNOSIS — R5383 Other fatigue: Secondary | ICD-10-CM

## 2021-06-22 LAB — BASIC METABOLIC PANEL
BUN: 18 mg/dL (ref 6–23)
CO2: 26 mEq/L (ref 19–32)
Calcium: 9.6 mg/dL (ref 8.4–10.5)
Chloride: 106 mEq/L (ref 96–112)
Creatinine, Ser: 1.07 mg/dL (ref 0.40–1.20)
GFR: 55.59 mL/min — ABNORMAL LOW (ref >60.00)
Glucose, Bld: 102 mg/dL — ABNORMAL HIGH (ref 70–99)
Potassium: 4 mEq/L (ref 3.5–5.1)
Sodium: 141 mEq/L (ref 135–145)

## 2021-06-22 LAB — CBC WITH DIFFERENTIAL/PLATELET
Basophils Absolute: 0 10*3/uL (ref 0.0–0.1)
Basophils Relative: 1.1 % (ref 0.0–3.0)
Eosinophils Absolute: 0 10*3/uL (ref 0.0–0.7)
Eosinophils Relative: 1.5 % (ref 0.0–5.0)
HCT: 41.7 % (ref 36.0–46.0)
Hemoglobin: 13.8 g/dL (ref 12.0–15.0)
Lymphocytes Relative: 17.3 % (ref 12.0–46.0)
Lymphs Abs: 0.6 10*3/uL — ABNORMAL LOW (ref 0.7–4.0)
MCHC: 33 g/dL (ref 30.0–36.0)
MCV: 91.7 fl (ref 78.0–100.0)
Monocytes Absolute: 0.2 10*3/uL (ref 0.1–1.0)
Monocytes Relative: 6.9 % (ref 3.0–12.0)
Neutro Abs: 2.4 10*3/uL (ref 1.4–7.7)
Neutrophils Relative %: 73.2 % (ref 43.0–77.0)
Platelets: 220 10*3/uL (ref 150.0–400.0)
RBC: 4.55 Mil/uL (ref 3.87–5.11)
RDW: 12.8 % (ref 11.5–15.5)
WBC: 3.2 10*3/uL — ABNORMAL LOW (ref 4.0–10.5)

## 2021-06-22 LAB — HEPATIC FUNCTION PANEL
ALT: 12 U/L (ref 0–35)
AST: 14 U/L (ref 0–37)
Albumin: 4.4 g/dL (ref 3.5–5.2)
Alkaline Phosphatase: 66 U/L (ref 39–117)
Bilirubin, Direct: 0.1 mg/dL (ref 0.0–0.3)
Total Bilirubin: 0.6 mg/dL (ref 0.2–1.2)
Total Protein: 7 g/dL (ref 6.0–8.3)

## 2021-06-22 LAB — B12 AND FOLATE PANEL
Folate: >24.4 ng/mL (ref >5.9)
Vitamin B-12: 307 pg/mL (ref 211–911)

## 2021-06-22 LAB — TSH: TSH: 0.35 u[IU]/mL (ref 0.35–5.50)

## 2021-06-22 LAB — VITAMIN D 25 HYDROXY (VIT D DEFICIENCY, FRACTURES): VITD: 47.08 ng/mL (ref 30.00–100.00)

## 2021-06-22 MED ORDER — PREDNISONE 10 MG PO TABS: ORAL_TABLET | ORAL | 0 refills | Status: DC

## 2021-06-22 NOTE — Progress Notes (Signed)
   Subjective:    Patient ID: Linda Buckley, female    DOB: [birth year], 65 y.o.   MRN: [id number]  HPI Anxiety- pt last visit we added Buspar to pt's current Wellbutrin.  Feels the 7.5mg  BID is a good dose and at this time, we don't need to adjust.    Bilateral hip pain- pt had R sided hip pain at last visit but now has bilateral hip pain.  Initially was lateral but now is posterior and radiating down the leg.  Pain is worse at night, not able to sleep.  Fatigue- pt is seeing a nutritionist at Dr Roetta Sessions office and they recommend lab work due to ongoing fatigue.  Review of Systems For ROS see HPI   This visit occurred during the SARS-CoV-2 public health emergency.  Safety protocols were in place, including screening questions prior to the visit, additional usage of staff PPE, and extensive cleaning of exam room while observing appropriate contact time as indicated for disinfecting solutions.      Objective:   Physical Exam Vitals reviewed.  Constitutional:      General: She is not in acute distress.    Appearance: Normal appearance. She is not ill-appearing.  HENT:     Head: Normocephalic and atraumatic.  Eyes:     Extraocular Movements: Extraocular movements intact.     Conjunctiva/sclera: Conjunctivae normal.     Pupils: Pupils are equal, round, and reactive to light.  Musculoskeletal:     Right lower leg: No edema.     Left lower leg: No edema.  Skin:    General: Skin is warm and dry.  Neurological:     General: No focal deficit present.     Mental Status: She is alert and oriented to person, place, and time.  Psychiatric:        Mood and Affect: Mood normal.        Behavior: Behavior normal.        Thought Content: Thought content normal.          Assessment & Plan:  Anxiety- improved w/ addition of Buspar.  Pt to continue current dose of Buspar and Wellbutrin.  Pt expressed understanding and is in agreement w/ plan.   Bilateral radiculopathy- pt has not heard  from the referral placed in early June.  Since sxs have now worsened and spread to both sides, will start Prednisone taper for symptomatic relief.  Pt expressed understanding and is in agreement w/ plan.

## 2021-06-22 NOTE — Assessment & Plan Note (Signed)
Pt has had excessive fatigue intermittently for years.  Dr Deliah Boston (bariatric doctor) encouraged labs to assess for metabolic cause.  Will treat any vitamin deficiencies or abnormalities if present

## 2021-06-22 NOTE — Patient Instructions (Signed)
Schedule your complete physical for February We'll keep trying to get Sports [facility] but if you don't hear from them call [phone] START the Prednisone as directed- take w/ food Continue the Buspar twice daily Call with any questions or concerns Hang in there! Have a great summer!!!

## 2021-06-25 NOTE — Progress Notes (Signed)
Left a voice mail message with patient results per DPR.

## 2021-07-03 ENCOUNTER — Other Ambulatory Visit: Payer: Self-pay

## 2021-07-03 ENCOUNTER — Ambulatory Visit (INDEPENDENT_AMBULATORY_CARE_PROVIDER_SITE_OTHER): Payer: Medicare Other | Admitting: Sports Medicine

## 2021-07-03 ENCOUNTER — Ambulatory Visit (INDEPENDENT_AMBULATORY_CARE_PROVIDER_SITE_OTHER): Payer: Medicare Other

## 2021-07-03 DIAGNOSIS — M545 Low back pain, unspecified: Secondary | ICD-10-CM | POA: Diagnosis not present

## 2021-07-03 DIAGNOSIS — M5416 Radiculopathy, lumbar region: Secondary | ICD-10-CM

## 2021-07-03 DIAGNOSIS — M549 Dorsalgia, unspecified: Secondary | ICD-10-CM

## 2021-07-03 DIAGNOSIS — M47816 Spondylosis without myelopathy or radiculopathy, lumbar region: Secondary | ICD-10-CM

## 2021-07-03 MED ORDER — GABAPENTIN 300 MG PO CAPS: ORAL_CAPSULE | ORAL | 3 refills | Status: DC

## 2021-07-03 NOTE — Progress Notes (Addendum)
    Procedures performed today:    None.  Independent interpretation of notes and tests performed by another provider:   I personally reviewed hip and pelvis x-rays from 2020, minimal degenerative change, I also reviewed abdominal/pelvic CT from 2014 with focus on the lumbar spine, there is lower lumbar L5-S1 bilateral facet arthritis and mild multilevel DDD.  Brief History, Exam, Impression, and Recommendations:    Lumbar spondylosis Linda Buckley 65 year old female with a several month long history of pain in her back with radiation down the anterolateral right thigh, posterior right lower leg to the top of the right foot. Worse with most positions, no red flag symptoms. She has had some burst of steroids without much improvement. She is on Lyrica and Cymbalta for fibromyalgia, she said she is still getting some dizziness, she stopped her Cymbalta and restarted as this did not help the dizziness, I do suspect it is from the Lyrica so we will discontinue this and transition to gabapentin. Has never had formal PT. She does have a history of a cervical fusion, she has a spinal cord stimulator for pain, this is an MRI safe stimulator per her report and she can turn this off prior to MRI. She is considering greater than 6 weeks of physician directed conservative treatment including medications I would like to proceed with MRI for epidural planning, likely right L5-S1 transforaminal. I did discuss the anatomy and evolutionary anthropology of disc disease with her today.  MRI does show some spinal stenosis there is also significant facet arthritis, no neural impingement, we could certainly try multilevel right-sided facet joint injections if she would like.  No epidurals as none of the nerve roots are pinched.    ___________________________________________ Gwen Her. Dianah Field, M.D., ABFM., CAQSM. Primary Care and [place] Instructor of [place] of [facility] of [facility]

## 2021-07-03 NOTE — Assessment & Plan Note (Addendum)
Pleasant 65 year old female with a several month long history of pain in her back with radiation down the anterolateral right thigh, posterior right lower leg to the top of the right foot. Worse with most positions, no red flag symptoms. She has had some burst of steroids without much improvement. She is on Lyrica and Cymbalta for fibromyalgia, she said she is still getting some dizziness, she stopped her Cymbalta and restarted as this did not help the dizziness, I do suspect it is from the Lyrica so we will discontinue this and transition to gabapentin. Has never had formal PT. She does have a history of a cervical fusion, she has a spinal cord stimulator for pain, this is an MRI safe stimulator per her report and she can turn this off prior to MRI. She is considering greater than 6 weeks of physician directed conservative treatment including medications I would like to proceed with MRI for epidural planning, likely right L5-S1 transforaminal. I did discuss the anatomy and evolutionary anthropology of disc disease with her today.  MRI does show some spinal stenosis there is also significant facet arthritis, no neural impingement, we could certainly try multilevel right-sided facet joint injections if she would like.  No epidurals as none of the nerve roots are pinched.

## 2021-07-05 ENCOUNTER — Ambulatory Visit (INDEPENDENT_AMBULATORY_CARE_PROVIDER_SITE_OTHER): Payer: Medicare Other | Admitting: Rehabilitative and Restorative Service Providers"

## 2021-07-05 ENCOUNTER — Other Ambulatory Visit: Payer: Self-pay

## 2021-07-05 DIAGNOSIS — M79604 Pain in right leg: Secondary | ICD-10-CM | POA: Diagnosis not present

## 2021-07-05 DIAGNOSIS — R29898 Other symptoms and signs involving the musculoskeletal system: Secondary | ICD-10-CM

## 2021-07-05 DIAGNOSIS — M5416 Radiculopathy, lumbar region: Secondary | ICD-10-CM

## 2021-07-05 DIAGNOSIS — M6281 Muscle weakness (generalized): Secondary | ICD-10-CM | POA: Diagnosis not present

## 2021-07-05 NOTE — Therapy (Addendum)
[facility] [place], [place], [address] Phone: [phone]   Fax:  [phone]  Physical Therapy Evaluation  Patient Details  Name: Linda Buckley MRN: [id number] Date of Birth: [birth year] Referring Provider (PT): Dr Dianah Field   Encounter Date: 07/05/2021   PT End of Session - 07/05/21 1610     Visit Number 1    Number of Visits 12    Date for PT Re-Evaluation 08/16/21    PT Start Time 6815    PT Stop Time 9470    PT Time Calculation (min) 49 min    Activity Tolerance Patient tolerated treatment well             Past Medical History:  Diagnosis Date   Allergy    Anxiety    Arthritis    Carpal tunnel syndrome of right wrist    RECURRENT   Depression    Dysphagia 09/02/2017   Edema 09/02/2017   Environmental allergies    dust mites, pollen, roaches and other insects, mold   Fibromyalgia 04/2016   Headache    migraines   Hereditary and idiopathic peripheral neuropathy 04/03/2016   History of adenomatous polyp of colon    History of gastric ulcer    History of kidney stones    Hypertension    Pneumonia    Sleep apnea    wears CPAP   Wears glasses     Past Surgical History:  Procedure Laterality Date   CARPAL TUNNEL RELEASE Right 2007   CARPAL TUNNEL RELEASE Right 07/05/2015   Procedure: RIGHT HAND REVISION CARPAL TUNNEL RELEASE;  Surgeon: Iran Planas, MD;  Location: [facility];  Service: Orthopedics;  Laterality: Right;   CARPAL TUNNEL RELEASE Left 09/2016   CERVICAL DISC ARTHROPLASTY  03/2015   COLONOSCOPY W/ POLYPECTOMY  04-15-2014   CYSTO/  RIGHT RETROGRADE PYELOGRAM/ URETEROSCOPY STONE EXTRACTION/  STENT PLACEMENT  06-07-2010   FINGER ARTHRODESIS Right 08/10/2018   Procedure: RIGHT THUMB ARTHRODESIS;  Surgeon: Milly Jakob, MD;  Location: [facility];  Service: Orthopedics;  Laterality: Right;   LAPAROSCOPIC ASSISTED VAGINAL HYSTERECTOMY  04-25-2004   ovaries remain    LAPAROSCOPIC GASTRIC BAND REMOVAL WITH LAPAROSCOPIC GASTRIC SLEEVE RESECTION  10/26/2018   TUBAL LIGATION  1982    There were no vitals filed for this visit.    Subjective Assessment - 07/05/21 1518     Subjective Patient reports that she has had Rt LBP in the past several years. She has had a cervical disc fusion ~  4 years ago with spinal cordstimulator for pain ~ 2 yrs ago. Pain inthe Rt posterior hip to thigh, knee and foot has increased in the past 2 months and she has noticed some painin the Lt LE in the 10 days. Patient reports that she has been wearing a back brace when she does anything to help with the pain.    Pertinent History cervical disc fusion 4 yrs ago; spine stimulator 2 yr ago; fibromyalgia x 7 yrs; arthritis; DDD; sleep apnea uses c-pap; CTS x 2 Rt, x1 Lt hand; partial hysterectomy    Patient Stated Goals decrease pain in the Rt leg    Currently in Pain? Yes    Pain Score 8     Pain Location Back    Pain Orientation Right    Pain Descriptors / Indicators Aching;Burning;Throbbing;Tightness;Sharp    Pain Type Chronic pain    Pain Radiating Towards posterior hip; ant/post thigh; leg; top and bottom of  foot    Pain Onset More than a month ago    Pain Frequency Intermittent    Aggravating Factors  anything    Pain Relieving Factors sleeping; pressure; topical analgesic lasts ~ 1 hour; nerve stimulator at times; injections last ~ 5 months ago(not helping)                [facility] PT Assessment - 07/05/21 0001       Assessment   Medical Diagnosis Rt lumbar radiculitis    Referring Provider (PT) Dr Dianah Field    Onset Date/Surgical Date 03/16/21   LBP ~ 6-7 yrs; Rt LE ~ 3 yrs worse in past 4 months   Hand Dominance Right    Next MD Visit after MRI    Prior Therapy yes for LBP with some help      Precautions   Precautions None      Restrictions   Weight Bearing Restrictions No      Balance Screen   Has the patient fallen in the past 6 months No    Has the  patient had a decrease in activity level because of a fear of falling?  No    Is the patient reluctant to leave their home because of a fear of falling?  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    Leisure household chores; cooking; 10 min of walking 3 days/week; water aerobics but not in the past several weeks - had been only 6 visits      Observation/Other Assessments   Focus on Therapeutic Outcomes (FOTO)  34      Sensation   Additional Comments tingling into Rt foot      Posture/Postural Control   Posture Comments head forward; shoulders rounded; flexed forward at hips      AROM   Lumbar Flexion 80% pain in the posterior Rt thigh; HS tightness/pull    Lumbar Extension 60% pain posterior thigh    Lumbar - Right Side Bend 85%    Lumbar - Left Side Bend 85%    Lumbar - Right Rotation 65%    Lumbar - Left Rotation 65% pain in the Rt posterior thigh      Strength   Overall Strength Comments pain with resistive testing Rt LE    Right Hip Flexion 4-/5    Right Hip Extension 4/5    Right Hip ABduction 3+/5      Flexibility   Hamstrings tight Rt    Quadriceps tight end range Rt    ITB tight Rt    Piriformis tight Rt      Palpation   Spinal mobility hypomobile lumbar spine with pain with PA mobs    Palpation comment muscular tightness through posterior Rt hip, thigh and calf; Rt > Lt psoas      Special Tests   Other special tests (-) SLR bilat      Ambulation/Gait   Gait Comments antalgic gait Rt LE      Balance   Balance Assessed --   poor SLS maintaining balance ~ 3-4 sec on the Lt and 2-3 sec on Rt  Objective measurements completed on examination: See above findings.       [facility]  Treatment/Exercise - 07/05/21 0001       Self-Care   Self-Care Other Self-Care Comments    Other Self-Care Comments  posture and alignment; sitting      Lumbar Exercises: Stretches   Passive Hamstring Stretch Limitations add Rt next visit with nerve mobilization    Hip Flexor Stretch Limitations add next visit    Standing Extension 2 reps   2-3 sec   Press Ups 5 reps    Press Ups Limitations 2-3 sec through partal range as pt tolerates    Piriformis Stretch Right;3 reps;30 seconds   supine travell     Lumbar Exercises: Seated   Sit to Stand 5 reps    Sit to Stand Limitations hinged hip with VC for core engaged      Lumbar Exercises: Supine   AB Set Limitations 2 part core pelvic floor and transverse abdominals reminders not to hold breath                    PT Education - 07/05/21 1602     Education Details HEP posture DN office chair POC    Person(s) Educated Patient    Methods Explanation;Demonstration;Tactile cues;Verbal cues;Handout    Comprehension Verbalized understanding;Returned demonstration;Verbal cues required;Tactile cues required                 PT Long Term Goals - 07/05/21 1801       PT LONG TERM GOAL #1   Title Improve core strength and stability with patient to tolerate standing and walking for 10-15 min with minimal to no increase in baseline pain    Time 6    Period Weeks    Status New    Target Date 08/16/21      PT LONG TERM GOAL #2   Title Patient to demonstrate and/or verbalize correct body mechanics and back care with transitional movements and ADL's    Time 6    Period Weeks    Status New    Target Date 08/16/21      PT LONG TERM GOAL #3   Title Increase Rt LE strength in hip to 4/5 to 4+/5    Time 6    Period Weeks    Status New    Target Date 08/16/21      PT LONG TERM GOAL #4   Title Independent in HEP (including aquatic program as indicated)    Time 6    Period Weeks    Status New    Target Date 08/16/21       PT LONG TERM GOAL #5   Title Improve functional limitation score to 46    Time 6    Period Weeks    Status New    Target Date 08/16/21                    Plan - 07/05/21 1610     Clinical Impression Statement Patient presents with history of chronic LBP x ~ 7 years with cervical fusion ~ 4 yrs ago and spinal cord stimulator placement ~ 2 yrs ago. Patient reports increased pain in the Rt LE over the past 3-2 months with symptoms in the posterior hip to ant/post thigh, calf, dorsum and plantar surfaces of foot. She has poor posture and alignment; core weakness; Rt LE weakness(pain effecting manual muscle resting results); sedentary lifestyle. She will benefit from  PT to address problems identified.    Stability/Clinical Decision Making Stable/Uncomplicated    Clinical Decision Making Low    Rehab Potential Fair    PT Frequency 2x / week    PT Duration 6 weeks    PT Treatment/Interventions ADLs/Self Care Home Management;Aquatic Therapy;Cryotherapy;Electrical Stimulation;Iontophoresis 81m/ml Dexamethasone;Moist Heat;Ultrasound;Gait training;Stair training;Functional mobility training;Therapeutic activities;Therapeutic exercise;Balance training;Neuromuscular re-education;Patient/family education;Manual techniques;Dry needling;Taping    PT Next Visit Plan review and progress exercises with focus on core stabilization and strengthening; possible trial of DN; myofacial ball release work; back care and postural education; encouraging increased activity at home; aquatic therapy    PT Home Exercise Plan [email or web address]             Patient will benefit from skilled therapeutic intervention in order to improve the following deficits and impairments:  Abnormal gait, Obesity, Decreased activity tolerance, Pain, Hypomobility, Improper body mechanics, Decreased mobility, Decreased strength, Postural dysfunction  Visit Diagnosis: Radiculopathy, lumbar region - Plan: PT plan of care  cert/re-cert  Pain in right leg - Plan: PT plan of care cert/re-cert  Other symptoms and signs involving the musculoskeletal system - Plan: PT plan of care cert/re-cert  Muscle weakness (generalized) - Plan: PT plan of care cert/re-cert     Problem List Patient Active Problem List   Diagnosis Date Noted   Right lumbar radiculitis 07/03/2021   Poor compliance with CPAP treatment 02/01/2020   OSA on CPAP 02/01/2020   Chronic left shoulder pain 09/17/2019   MDD (major depressive disorder), recurrent episode, severe ([place] 05/05/2018   Vitamin D deficiency 11/05/2017   Dysphagia 09/02/2017   Multiple falls 09/04/2016   Hereditary and idiopathic peripheral neuropathy 04/03/2016   Exertional shortness of breath 01/26/2016   Left leg pain 01/26/2016   Fibromyalgia 07/28/2015   Right wrist pain 04/05/2015   Physical exam 03/01/2014   HTN (hypertension) 01/12/2014   Morbid obesity ([place] 01/12/2014   Pre-diabetes 01/12/2014   Family history of lupus erythematosus 01/12/2014   Fatigue 10/17/2013    Terri Malerba PNilda SimmerPT, MPH  07/05/2021, 6:46 PM  [place] [address] [address]Phone: [phone]  Fax:  [phone] Name: Linda WALSWORTHMRN: [id number] of Birth: [birth year] PHYSICAL THERAPY DISCHARGE SUMMARY  Visits from Start of Care: Patient seen for evaluation only   Current functional level related to goals / functional outcomes: See initial evaluation    Remaining deficits: Unknown   Education / Equipment: Initial HEP    Patient agrees to discharge. Patient goals were not met. Patient is being discharged due to not returning since the last visit.   Trelon Plush P. HHelene KelpPT, MPH 08/23/21 12:18 PM

## 2021-07-05 NOTE — Patient Instructions (Signed)
Access Code: [email or web address] URL: [email or web address] Date: 07/05/2021 Prepared by: Gillermo Murdoch  Exercises Prone Press Up - 2 x daily - 7 x weekly - 1 sets - 10 reps - 2-3 sec hold Standing Lumbar Extension - 2 x daily - 7 x weekly - 1 sets - 2-3 reps - 2-3 sec hold Supine Transversus Abdominis Bracing with Pelvic Floor Contraction - 2 x daily - 7 x weekly - 1 sets - 10 reps - 10sec hold Supine Piriformis Stretch with Leg Straight - 2 x daily - 7 x weekly - 1 sets - 3 reps - 30 sec hold Sit to Stand - 2 x daily - 7 x weekly - 1 sets - 10 reps - 3-5 sec hold  Patient Education Tour manager [place]

## 2021-07-07 ENCOUNTER — Ambulatory Visit (INDEPENDENT_AMBULATORY_CARE_PROVIDER_SITE_OTHER): Payer: Medicare Other

## 2021-07-07 ENCOUNTER — Other Ambulatory Visit: Payer: Self-pay

## 2021-07-07 DIAGNOSIS — M5416 Radiculopathy, lumbar region: Secondary | ICD-10-CM

## 2021-07-07 DIAGNOSIS — M5116 Intervertebral disc disorders with radiculopathy, lumbar region: Secondary | ICD-10-CM | POA: Diagnosis not present

## 2021-07-07 DIAGNOSIS — M4316 Spondylolisthesis, lumbar region: Secondary | ICD-10-CM | POA: Diagnosis not present

## 2021-07-07 DIAGNOSIS — M48061 Spinal stenosis, lumbar region without neurogenic claudication: Secondary | ICD-10-CM | POA: Diagnosis not present

## 2021-07-07 DIAGNOSIS — Z9889 Other specified postprocedural states: Secondary | ICD-10-CM | POA: Diagnosis not present

## 2021-07-10 ENCOUNTER — Telehealth (INDEPENDENT_AMBULATORY_CARE_PROVIDER_SITE_OTHER): Payer: Medicare Other | Admitting: Sports Medicine

## 2021-07-10 DIAGNOSIS — M47816 Spondylosis without myelopathy or radiculopathy, lumbar region: Secondary | ICD-10-CM | POA: Diagnosis not present

## 2021-07-10 NOTE — Progress Notes (Signed)
Virtual Visit via WebEx/MyChart   I connected with  Linda Buckley  on 07/10/21 via WebEx/MyChart/Doximity Video and verified that I am speaking with the correct person using two identifiers.   I discussed the limitations, risks, security and privacy concerns of performing an evaluation and management service by WebEx/MyChart/Doximity Video, including the higher likelihood of inaccurate diagnosis and treatment, and the availability of in person appointments.  We also discussed the likely need of an additional face to face encounter for complete and high quality delivery of care.  I also discussed with the patient that there may be a patient responsible charge related to this service. The patient expressed understanding and wishes to proceed.  Provider location is in medical facility. Patient location is at their home, different from provider location. People involved in care of the patient during this telehealth encounter were myself, my nurse/medical assistant, and my front office/scheduling team member.  Review of Systems: No fevers, chills, night sweats, weight loss, chest pain, or shortness of breath.   Objective Findings:    General: Speaking full sentences, no audible heavy breathing.  Sounds alert and appropriately interactive.  Appears well.  Face symmetric.  Extraocular movements intact.  Pupils equal and round.  No nasal flaring or accessory muscle use visualized.  Independent interpretation of tests performed by another provider:   I did personally review Linda Buckley's lumbar spine MRI, she has multilevel lumbar facet arthritis from L2-S1, worst from L2-L5, she also has mild central canal stenosis at the L2-L3 level.  Brief History, Exam, Impression, and Recommendations:    Lumbar spondylosis Linda Buckley returns in a video visit to discuss her MRI results, to recap she is a pleasant 65 year old female with a several month long history of pain in her low back with radiation down the  anterolateral right thigh, posterior right lower leg to the top of the right foot, worse with most positions, she had no red flag symptoms, she has been through bursts of steroids, she has been through home conditioning exercises, Lyrica, Cymbalta. She also has a history of a cervical fusion with a spinal cord stimulator. At the last visit we transitioned her to gabapentin and due to failure of conservative treatments pulled the trigger for a lumbar spine MRI the results of which will be dictated above. She is noting some improvement with her gabapentin currently at 300 mg nightly going up to twice daily. Dominant finding is right-sided L2-L5 facet joint osteoarthritis, these will be targeted with facet joint injections, patient prefers Dr. Francesco Runner. If successful we will proceed with medial branch blocks and radiofrequency ablation. Return to see me 4 weeks after injections, if no improvement, not even temporary we will target the L2-L3 spinal stenosis with an epidural.   I discussed the above assessment and treatment plan with the patient. The patient was provided an opportunity to ask questions and all were answered. The patient agreed with the plan and demonstrated an understanding of the instructions.   The patient was advised to call back or seek an in-person evaluation if the symptoms worsen or if the condition fails to improve as anticipated.   I provided 30 minutes of face to face and non-face-to-face time during this encounter date, time was needed to gather information, review chart, records, communicate/coordinate with staff remotely, as well as complete documentation.   ___________________________________________ Gwen Her. Dianah Field, M.D., ABFM., CAQSM. Primary Care and [place] Instructor of [place] of [facility]  of Medicine

## 2021-07-10 NOTE — Assessment & Plan Note (Addendum)
Linda Buckley returns in a video visit to discuss her MRI results, to recap she is a pleasant 65 year old female with a several month long history of pain in her low back with radiation down the anterolateral right thigh, posterior right lower leg to the top of the right foot, worse with most positions, she had no red flag symptoms, she has been through bursts of steroids, she has been through home conditioning exercises, Lyrica, Cymbalta. She also has a history of a cervical fusion with a spinal cord stimulator. At the last visit we transitioned her to gabapentin and due to failure of conservative treatments pulled the trigger for a lumbar spine MRI the results of which will be dictated above. She is noting some improvement with her gabapentin currently at 300 mg nightly going up to twice daily. Dominant finding is right-sided L2-L5 facet joint osteoarthritis, these will be targeted with facet joint injections, patient prefers Dr. Francesco Runner. If successful we will proceed with medial branch blocks and radiofrequency ablation. Return to see me 4 weeks after injections, if no improvement, not even temporary we will target the L2-L3 spinal stenosis with an epidural.

## 2021-07-11 ENCOUNTER — Encounter: Payer: Medicare Other | Admitting: Physical Therapy

## 2021-07-13 ENCOUNTER — Encounter: Payer: Medicare Other | Admitting: Rehabilitative and Restorative Service Providers"

## 2021-07-19 ENCOUNTER — Other Ambulatory Visit: Payer: Self-pay | Admitting: Family Medicine

## 2021-07-19 NOTE — Telephone Encounter (Signed)
LFD 05/21/21 #30 with 1 refill LOV 06/22/21 NOV 01/23/22

## 2021-07-23 DIAGNOSIS — M47816 Spondylosis without myelopathy or radiculopathy, lumbar region: Secondary | ICD-10-CM | POA: Diagnosis not present

## 2021-07-23 DIAGNOSIS — M5136 Other intervertebral disc degeneration, lumbar region: Secondary | ICD-10-CM | POA: Diagnosis not present

## 2021-07-23 DIAGNOSIS — G894 Chronic pain syndrome: Secondary | ICD-10-CM | POA: Diagnosis not present

## 2021-07-23 DIAGNOSIS — M5459 Other low back pain: Secondary | ICD-10-CM | POA: Diagnosis not present

## 2021-08-02 ENCOUNTER — Telehealth: Payer: Self-pay

## 2021-08-02 NOTE — Progress Notes (Signed)
Chronic Care Management Pharmacy Assistant   Name: Linda Buckley  MRN: [id number] DOB: [birth year]  Reason for Encounter: Disease State - General Adherence Call / Scheduled Follow up CPP visit.     Recent office visits:  07/10/21 Aundria Mems, MD - Family Medicine (Video visit) - Referral placed to Pain Clinic. No medication changes. Follow up in 4 weeks.   07/03/21 Harrell Lark, MD - Family Medicine - Right Lumbar Radiculitis - Lumbar spine xray ordered. Ambulatory referral to Physical Therapy. MRI Lumbar Spine ordered.   06/22/21 Annye Asa, MD (PCP) - Family Medicine - Labs were ordered. Prednisone (DELTASONE) 10 MG tablet 3 tabs x3 days and then 2 tabs x3 days and then 1 tab x3 prescribed.   05/21/21 Annye Asa, MD (PCP) - Family Medicine - Right hip pain - Referral to Orthopedic Surgery pleaced - Started BusPIRone (BUSPAR) 7.5 MG tablet. HYDROcodone-acetaminophen (NORCO/VICODIN) 5-325 MG tablet prescribed as needed for pain. Meloxicam (MOBIC) 15 MG tablet prescribed.    Recent consult visits:   05/09/21 - Elayne Snare, NP -  Bariatrics - Obesity - Topiramate (TOPAMAX) 50 MG tablet prescribed. Ambulatory referral to Care Coordinates Dietitian placed. AMB REFERRAL TO [facility] placed. Follow up in 2 months.   Hospital visits:  None in previous 6 months  Medications: Outpatient Encounter Medications as of 08/02/2021  Medication Sig   albuterol (VENTOLIN HFA) 108 (90 Base) MCG/ACT inhaler INHALE 2 PUFFS BY MOUTH EVERY 4 HOURS AS NEEDED FOR SHORTNESS OF BREATH   buPROPion (WELLBUTRIN XL) 150 MG 24 hr tablet TAKE 1 TABLET BY MOUTH  DAILY   buPROPion (WELLBUTRIN XL) 300 MG 24 hr tablet TAKE 1 TABLET BY MOUTH IN  THE MORNING   busPIRone (BUSPAR) 7.5 MG tablet Take 1 tablet (7.5 mg total) by mouth 2 (two) times daily.   cetirizine (ZYRTEC) 10 MG tablet TAKE 1 TABLET(10 MG) BY MOUTH DAILY   Cholecalciferol (VITAMIN D PO) Take 1 tablet by mouth  daily. 1000units   clonazePAM (KLONOPIN) 0.5 MG tablet TAKE 1 TABLET(0.5 MG) BY MOUTH AT BEDTIME   gabapentin (NEURONTIN) 300 MG capsule One tab PO qHS for a week, then BID for a week, then TID. May double weekly to a max of 3,'600mg'$ /day   meloxicam (MOBIC) 15 MG tablet TAKE ONE TABLET BY MOUTH DAILY   Multiple Vitamin (MULTIVITAMIN) capsule Take 1 capsule by mouth daily. With iron   topiramate (TOPAMAX) 50 MG tablet Take 1/2 tablet po daily for one week and then increase to a full pill   triamcinolone ointment (KENALOG) 0.1 % Apply 1 application topically 2 (two) times daily.   No facility-administered encounter medications on file as of 08/02/2021.    Have you had any problems recently with your health? Patient denied any recent concerns with her health.   Have you had any problems with your pharmacy? Patient denied having any problems with current pharmacy.  What issues or side effects are you having with your medications? Patient denied any issues or side effects with medications.   What would you like me to pass along to Madelin Rear, CPP for them to help you with?  Patient does not have anything she would like to pass along to CPP.  What can we do to take care of you better? Patient did not have any recommendation at this time. She is satisfied with her current level of care.    Star Rating Drugs: No Star Rating Drugs noted.   Future Appointments  Date  Time Provider [place]  09/05/2021  4:00 PM [facility] CCM PHARMACIST [facility]  11/12/2021 10:30 AM Ward Givens, NP [facility] None  01/23/2022 10:00 AM Midge Minium, MD [facility]  02/11/2022  9:00 AM [facility] HEALTH COACH [facility]  02/11/2022 10:00 AM Midge Minium, MD [facility]   Patient scheduled for Follow up CPP phone visit for : 09/05/21 @ 4pm. Patient confimed appointment date and time.    Jobe Gibbon, [place] Pharmacist Assistant  [phone]  Time Spent: 60 minutes

## 2021-08-15 ENCOUNTER — Telehealth: Payer: Self-pay

## 2021-08-15 ENCOUNTER — Other Ambulatory Visit: Payer: Self-pay | Admitting: Family

## 2021-08-15 MED ORDER — CLONAZEPAM 0.5 MG PO TABS: ORAL_TABLET | ORAL | 3 refills | Status: DC

## 2021-08-15 NOTE — Telephone Encounter (Signed)
Requesting:Klonopin 0.'5mg'$  Contract: UDS: Last Visit:06/22/21 Next Visit:01/23/22 Last Refill:02/05/21 30 tabs 3 refills  Please Advise

## 2021-08-16 ENCOUNTER — Ambulatory Visit (INDEPENDENT_AMBULATORY_CARE_PROVIDER_SITE_OTHER): Payer: Medicare Other | Admitting: Sports Medicine

## 2021-08-16 ENCOUNTER — Ambulatory Visit (INDEPENDENT_AMBULATORY_CARE_PROVIDER_SITE_OTHER): Payer: Medicare Other

## 2021-08-16 ENCOUNTER — Other Ambulatory Visit: Payer: Self-pay

## 2021-08-16 DIAGNOSIS — M47816 Spondylosis without myelopathy or radiculopathy, lumbar region: Secondary | ICD-10-CM

## 2021-08-16 DIAGNOSIS — M25561 Pain in right knee: Secondary | ICD-10-CM

## 2021-08-16 DIAGNOSIS — G8929 Other chronic pain: Secondary | ICD-10-CM | POA: Diagnosis not present

## 2021-08-16 DIAGNOSIS — M1711 Unilateral primary osteoarthritis, right knee: Secondary | ICD-10-CM | POA: Insufficient documentation

## 2021-08-16 DIAGNOSIS — M25562 Pain in left knee: Secondary | ICD-10-CM | POA: Diagnosis not present

## 2021-08-16 NOTE — Assessment & Plan Note (Signed)
Linda Buckley had some right-sided L2-L5 facet joint osteoarthritis, she got medial branch blocks with Dr. Francesco Runner approximately 3 weeks ago. She did well, I think she should see Dr. Francesco Runner again for consideration of radiofrequency ablation.

## 2021-08-16 NOTE — Assessment & Plan Note (Signed)
Linda Buckley continues to have pain in her right knee, localized medial and lateral joint lines, difficulty bending and straightening it out. On exam she has tenderness at the medial joint line predominantly, no effusion. We will get some x-rays, I injected her knee today. Return to see me in a month, MRI if no better.

## 2021-08-16 NOTE — Progress Notes (Signed)
    Procedures performed today:    Procedure: Real-time Ultrasound Guided injection of the right knee Device: Samsung HS60  Verbal informed consent obtained.  Time-out conducted.  Noted no overlying erythema, induration, or other signs of local infection.  Skin prepped in a sterile fashion.  Local anesthesia: Topical Ethyl chloride.  With sterile technique and under real time ultrasound guidance: Noted trace effusion, 1 cc Kenalog 40, 2 cc lidocaine, 2 cc bupivacaine injected easily Completed without difficulty  Advised to call if fevers/chills, erythema, induration, drainage, or persistent bleeding.  Images permanently stored and available for review in PACS.  Impression: Technically successful ultrasound guided injection.  Independent interpretation of notes and tests performed by another provider:   None.  Brief History, Exam, Impression, and Recommendations:    Chronic pain of right knee [name] continues to have pain in her right knee, localized medial and lateral joint lines, difficulty bending and straightening it out. On exam she has tenderness at the medial joint line predominantly, no effusion. We will get some x-rays, I injected her knee today. Return to see me in a month, MRI if no better.  Lumbar spondylosis Sherryll had some right-sided L2-L5 facet joint osteoarthritis, she got medial branch blocks with Dr. Francesco Runner approximately 3 weeks ago. She did well, I think she should see Dr. Francesco Runner again for consideration of radiofrequency ablation.    ___________________________________________ Gwen Her. Dianah Field, M.D., ABFM., CAQSM. Primary Care and [place] Instructor of [place] of [facility] of Medicine

## 2021-08-21 ENCOUNTER — Encounter: Payer: Self-pay | Admitting: Gastroenterology

## 2021-09-05 ENCOUNTER — Telehealth: Payer: Medicare Other

## 2021-09-05 ENCOUNTER — Telehealth: Payer: Self-pay

## 2021-09-05 NOTE — Telephone Encounter (Signed)
  Chronic Care Management  Outreach Note   Name: Linda Buckley MRN: [id number] DOB: February 18, 1956  Referred by: Midge Minium, MD Reason for referral: Telephone Appointment with [place] Pharmacist, Madelin Rear.   An unsuccessful telephone outreach was attempted today. The patient was referred to the pharmacist for assistance with care management and care coordination.   Telephone appointment with clinical pharmacist today (09/05/2021) at 4pm. If patient immediately returns call, transfer to [id number].   Madelin Rear, PharmD, CPP Clinical Pharmacist Practitioner  [facility]  [phone]

## 2021-09-05 NOTE — Progress Notes (Deleted)
Chronic Care Management Pharmacy Note  09/05/2021 Name:  Linda Buckley MRN:  [id number] DOB:  1956-11-15  Summary: ***  Subjective: Linda Buckley is an 65 y.o. year old female who is a primary patient of Tabori, Aundra Millet, MD.  The CCM team was consulted for assistance with disease management and care coordination needs.    {CCMTELEPHONEFACETOFACE:21091510} for {CCMINITIALFOLLOWUPCHOICE:21091511} in response to provider referral for pharmacy case management and/or care coordination services.   Consent to Services:  {CCMCONSENTOPTIONS:25074}  Patient Care Team: Midge Minium, MD as PCP - General (Family Medicine) Iran Planas, MD as Consulting Physician (Orthopedic Surgery) Suella Broad, MD as Consulting Physician (Physical Medicine and Rehabilitation) Melina Schools, MD as Consulting Physician (Orthopedic Surgery) Milus Banister, MD as Attending Physician (Gastroenterology) Kathrynn Ducking, MD as Consulting Physician (Neurology) Ahmed Prima, Fransisco Hertz, PA-C as Physician Assistant (Physician Assistant) Ward Givens, NP as Registered Nurse (Gerontology) Lorretta Harp, PA-C as Physician Assistant (Orthopedic Surgery) Madelin Rear, [facility] as Pharmacist (Pharmacist)  Hospital visits: {Hospital DC Yes/No:21091515}  Objective:  Lab Results  Component Value Date   CREATININE 1.07 06/22/2021   CREATININE 0.98 02/05/2021   CREATININE 0.80 02/02/2020    Lab Results  Component Value Date   HGBA1C 6.0 11/05/2017   HGBA1C 6.0 07/04/2017   HGBA1C 5.9 03/04/2017   Last diabetic Eye exam: No results found for: HMDIABEYEEXA  Last diabetic Foot exam: No results found for: HMDIABFOOTEX      Component Value Date/Time   CHOL 172 02/05/2021 1024   CHOL 187 02/02/2020 1052   CHOL 163 01/29/2019 1138   TRIG 73.0 02/05/2021 1024   TRIG 81.0 02/02/2020 1052   TRIG 69.0 01/29/2019 1138   HDL 66.20 02/05/2021 1024   HDL 67.70 02/02/2020 1052   HDL 49.10 01/29/2019  1138   CHOLHDL 3 02/05/2021 1024   VLDL 14.6 02/05/2021 1024   LDLCALC 91 02/05/2021 1024   LDLCALC 103 (H) 02/02/2020 1052   LDLCALC 100 (H) 01/29/2019 1138    Hepatic Function Latest Ref Rng & Units 06/22/2021 02/05/2021 02/02/2020  Total Protein 6.0 - 8.3 g/dL 7.0 7.0 6.4  Albumin 3.5 - 5.2 g/dL 4.4 4.1 4.0  AST 0 - 37 U/L 14 20 14   ALT 0 - 35 U/L 12 16 10   Alk Phosphatase 39 - 117 U/L 66 63 62  Total Bilirubin 0.2 - 1.2 mg/dL 0.6 0.5 0.5  Bilirubin, Direct 0.0 - 0.3 mg/dL 0.1 0.1 0.1    Lab Results  Component Value Date/Time   TSH 0.35 06/22/2021 11:11 AM   TSH 1.23 02/05/2021 10:26 AM   FREET4 0.68 03/03/2017 12:10 PM    CBC Latest Ref Rng & Units 06/22/2021 02/05/2021 02/02/2020  WBC 4.0 - 10.5 K/uL 3.2(L) 3.3(L) 3.8(L)  Hemoglobin 12.0 - 15.0 g/dL 13.8 12.8 12.9  Hematocrit 36.0 - 46.0 % 41.7 38.9 39.4  Platelets 150.0 - 400.0 K/uL 220.0 227.0 241.0    Lab Results  Component Value Date/Time   VD25OH 47.08 06/22/2021 11:11 AM   VD25OH 50.35 02/05/2021 10:26 AM    Clinical ASCVD:  The 10-year ASCVD risk score (Arnett DK, et al., 2019) is: 5.2%   Values used to calculate the score:     Age: 54 years     Sex: Female     Is Non-Hispanic African American: Yes     Diabetic: No     Tobacco smoker: No     Systolic Blood Pressure: 308 mmHg     Is BP treated: No  HDL Cholesterol: 66.2 mg/dL     Total Cholesterol: 172 mg/dL   Social History   Tobacco Use  Smoking Status Never  Smokeless Tobacco Never   BP Readings from Last 3 Encounters:  06/22/21 120/80  05/21/21 115/80  02/08/21 127/82   Pulse Readings from Last 3 Encounters:  06/22/21 66  05/21/21 78  02/08/21 70   Wt Readings from Last 3 Encounters:  06/22/21 212 lb (96.2 kg)  05/21/21 213 lb 3.2 oz (96.7 kg)  02/08/21 213 lb (96.6 kg)   BMI Readings from Last 3 Encounters:  06/22/21 34.22 kg/m  05/21/21 34.41 kg/m  02/08/21 34.38 kg/m    Assessment: Review of patient past medical history,  allergies, medications, health status, including review of consultants reports, laboratory and other test data, was performed as part of comprehensive evaluation and provision of chronic care management services.   SDOH:  (Social Determinants of Health) assessments and interventions performed: ***   CCM Care Plan  Allergies  Allergen Reactions   Aspirin Swelling    Sweating/ swelling of hands and face    Bee Venom Swelling    Swelling at site     Medications Reviewed Today     Reviewed by Everardo All, PT (Physical Therapist) on 07/05/21 at 1515  Med List Status: <None>   Medication Order Taking? Sig Documenting Provider Last Dose Status Informant  albuterol (VENTOLIN HFA) 108 (90 Base) MCG/ACT inhaler [id number] Yes INHALE 2 PUFFS BY MOUTH EVERY 4 HOURS AS NEEDED FOR SHORTNESS OF BREATH Brunetta Jeans, PA-C Taking Active   buPROPion (WELLBUTRIN XL) 150 MG 24 hr tablet [id number] Yes TAKE 1 TABLET BY MOUTH  DAILY Midge Minium, MD Taking Active   buPROPion (WELLBUTRIN XL) 300 MG 24 hr tablet [id number] Yes TAKE 1 TABLET BY MOUTH IN  THE MORNING Midge Minium, MD Taking Active   busPIRone (BUSPAR) 7.5 MG tablet [id number] Yes Take 1 tablet (7.5 mg total) by mouth 2 (two) times daily. Midge Minium, MD Taking Active   cetirizine (ZYRTEC) 10 MG tablet [id number] Yes TAKE 1 TABLET(10 MG) BY MOUTH DAILY Brunetta Jeans, PA-C Taking Active   Cholecalciferol (VITAMIN D PO) [id number] Yes Take 1 tablet by mouth daily. 1000units [provider] Taking Active Self  clonazePAM (KLONOPIN) 0.5 MG tablet [id number] Yes TAKE 1 TABLET(0.5 MG) BY MOUTH AT BEDTIME Midge Minium, MD Taking Active   gabapentin (NEURONTIN) 300 MG capsule [id number] Yes One tab PO qHS for a week, then BID for a week, then TID. May double weekly to a max of 3,65m/day TSilverio Decamp MD Taking Active   meloxicam (MOBIC) 15 MG tablet [id number] Take 1 tablet (15 mg total) by mouth  daily. TMidge Minium MD Taking Active   Multiple Vitamin (MULTIVITAMIN) capsule [id number] Take 1 capsule by mouth daily. With iron [provider] Taking Active Self  topiramate (TOPAMAX) 50 MG tablet [id number] Take 1/2 tablet po daily for one week and then increase to a full pill [provider] Taking Active   triamcinolone ointment (KENALOG) 0.1 % [id number] Apply 1 application topically 2 (two) times daily. TMidge Minium MD Taking Active             Patient Active Problem List   Diagnosis Date Noted   Chronic pain of right knee 08/16/2021   Lumbar spondylosis 07/03/2021   Poor compliance with CPAP treatment 02/01/2020   OSA on CPAP 02/01/2020   Chronic left shoulder  pain 09/17/2019   MDD (major depressive disorder), recurrent episode, severe ([place]) 05/05/2018   Vitamin D deficiency 11/05/2017   Dysphagia 09/02/2017   Multiple falls 09/04/2016   Hereditary and idiopathic peripheral neuropathy 04/03/2016   Exertional shortness of breath 01/26/2016   Left leg pain 01/26/2016   Fibromyalgia 07/28/2015   Right wrist pain 04/05/2015   Physical exam 03/01/2014   HTN (hypertension) 01/12/2014   Morbid obesity ([place]) 01/12/2014   Pre-diabetes 01/12/2014   Family history of lupus erythematosus 01/12/2014   Fatigue 10/17/2013    Immunization History  Administered Date(s) Administered   Influenza,inj,Quad PF,6+ Mos 09/15/2014, 10/24/2015, 09/08/2017, 01/29/2019   Influenza-Unspecified 12/25/2020   PFIZER(Purple Top)SARS-COV-2 Vaccination 02/29/2020, 03/21/2020, 10/10/2020   Tdap 11/05/2017    Conditions to be addressed/monitored: ***  There are no care plans that you recently modified to display for this patient.  Current Barriers:  {pharmacybarriers:24917}  Pharmacist Clinical Goal(s):  Patient will {PHARMACYGOALCHOICES:24921} through collaboration with PharmD and provider.   Interventions: 1:1 collaboration with Midge Minium, MD regarding development and update of comprehensive plan of care as evidenced by provider attestation and co-signature Inter-disciplinary care team collaboration (see longitudinal plan of care) Comprehensive medication review performed; medication list updated in electronic medical record  {CCM PHARMD DISEASE STATES:25130}  Patient Goals/Self-Care Activities Patient will:  - {pharmacypatientgoals:24919}  Medication Assistance: {MEDASSISTANCEINFO:25044}  Patient's preferred pharmacy is:  [facility] [id number] - [place], [place] [address] Phone: [phone] Fax: [phone]  [facility]  ([place], [place] [facility] [address] [place] [address] [place] [address] Phone: [phone] Fax: [phone]   Pt endorses ***% compliance  Follow Up:  Patient agrees to Care Plan and Follow-up. Plan: [facility] ***. Pharmacist ***  Future Appointments  Date Time Provider [place]  09/27/2021  9:15 AM Silverio Decamp, MD [facility] None  01/23/2022 10:00 AM Midge Minium, MD [facility]  02/11/2022  9:00 AM [facility] HEALTH COACH [facility]  02/11/2022 10:00 AM Midge Minium, MD [facility]    {jpsigs:26361}

## 2021-09-11 DIAGNOSIS — G4733 Obstructive sleep apnea (adult) (pediatric): Secondary | ICD-10-CM | POA: Diagnosis not present

## 2021-09-27 ENCOUNTER — Ambulatory Visit (INDEPENDENT_AMBULATORY_CARE_PROVIDER_SITE_OTHER): Payer: Medicare Other | Admitting: Sports Medicine

## 2021-09-27 ENCOUNTER — Other Ambulatory Visit: Payer: Self-pay

## 2021-09-27 ENCOUNTER — Ambulatory Visit (INDEPENDENT_AMBULATORY_CARE_PROVIDER_SITE_OTHER): Payer: Medicare Other

## 2021-09-27 DIAGNOSIS — M7061 Trochanteric bursitis, right hip: Secondary | ICD-10-CM | POA: Diagnosis not present

## 2021-09-27 DIAGNOSIS — M1711 Unilateral primary osteoarthritis, right knee: Secondary | ICD-10-CM

## 2021-09-27 DIAGNOSIS — M25551 Pain in right hip: Secondary | ICD-10-CM | POA: Diagnosis not present

## 2021-09-27 DIAGNOSIS — M25552 Pain in left hip: Secondary | ICD-10-CM | POA: Diagnosis not present

## 2021-09-27 NOTE — Assessment & Plan Note (Signed)
Linda Buckley returns, she had a knee joint injection at the last visit, knee is now pain-free.

## 2021-09-27 NOTE — Assessment & Plan Note (Signed)
Now having some pain in the right hip, no pain with internal rotation, good motion, reproduction of tenderness with palpation of the greater trochanter, adding greater trochanter conditioning, x-rays, return to see me in 1 month, injection if no better.

## 2021-09-27 NOTE — Progress Notes (Signed)
    Procedures performed today:    None.  Independent interpretation of notes and tests performed by another provider:   None.  Brief History, Exam, Impression, and Recommendations:    Primary osteoarthritis of right knee Birttany returns, she had a knee joint injection at the last visit, knee is now pain-free.  Greater trochanteric bursitis, right Now having some pain in the right hip, no pain with internal rotation, good motion, reproduction of tenderness with palpation of the greater trochanter, adding greater trochanter conditioning, x-rays, return to see me in 1 month, injection if no better.    ___________________________________________ Gwen Her. Dianah Field, M.D., ABFM., CAQSM. Primary Care and [place] Instructor of [place] of [facility] of Medicine

## 2021-11-12 ENCOUNTER — Telehealth: Payer: Self-pay | Admitting: Adult Health

## 2021-11-12 ENCOUNTER — Other Ambulatory Visit: Payer: Self-pay

## 2021-11-12 ENCOUNTER — Ambulatory Visit (INDEPENDENT_AMBULATORY_CARE_PROVIDER_SITE_OTHER): Payer: Medicare Other | Admitting: Sports Medicine

## 2021-11-12 ENCOUNTER — Ambulatory Visit: Payer: Medicare Other | Admitting: Adult Health

## 2021-11-12 DIAGNOSIS — M25561 Pain in right knee: Secondary | ICD-10-CM

## 2021-11-12 DIAGNOSIS — G4733 Obstructive sleep apnea (adult) (pediatric): Secondary | ICD-10-CM

## 2021-11-12 DIAGNOSIS — G8929 Other chronic pain: Secondary | ICD-10-CM | POA: Diagnosis not present

## 2021-11-12 DIAGNOSIS — M7061 Trochanteric bursitis, right hip: Secondary | ICD-10-CM | POA: Diagnosis not present

## 2021-11-12 MED ORDER — MELOXICAM 15 MG PO TABS: ORAL_TABLET | ORAL | 3 refills | Status: DC

## 2021-11-12 NOTE — Progress Notes (Signed)
    Procedures performed today:    None.  Independent interpretation of notes and tests performed by another provider:   None.  Brief History, Exam, Impression, and Recommendations:    Chronic pain of right knee This is a pleasant 65 year old female, she has chronic right knee pain, anterior, we have been treating her since sometime in the fall, she has had pain anteriorly, no trauma, no mechanical symptoms, we did an injection, home conditioning/greater than 6 weeks of physician directed physical therapy and she improved considerably, unfortunately she is starting to have recurrence of pain in the right knee, proceeding with MRI, adding meloxicam. Follow-up will depend on MRI results.  Greater trochanteric bursitis, right Right hip pain still present but improved considerably, return as needed.  Chronic process with exacerbation and pharmacologic intervention  ___________________________________________ Gwen Her. Dianah Field, M.D., ABFM., CAQSM. Primary Care and [place] Instructor of [place] of [facility] of Medicine

## 2021-11-12 NOTE — Telephone Encounter (Signed)
Pt last seen Feb 2022. Her machine was issued on 11/22/2016. Will send request to [facility] Shea NP.

## 2021-11-12 NOTE — Telephone Encounter (Signed)
Pt called, spoke with DME company. CPAP machine and mask was leaking. Instructed by DME to call  physician to order to CPAP machine and mask. Would like a call from the nurse.

## 2021-11-12 NOTE — Telephone Encounter (Signed)
Order placed

## 2021-11-12 NOTE — Assessment & Plan Note (Signed)
This is a pleasant 65 year old female, she has chronic right knee pain, anterior, we have been treating her since sometime in the fall, she has had pain anteriorly, no trauma, no mechanical symptoms, we did an injection, home conditioning/greater than 6 weeks of physician directed physical therapy and she improved considerably, unfortunately she is starting to have recurrence of pain in the right knee, proceeding with MRI, adding meloxicam. Follow-up will depend on MRI results.

## 2021-11-12 NOTE — Telephone Encounter (Signed)
Secure message sent to [facility] regarding new order placed.

## 2021-11-12 NOTE — Telephone Encounter (Signed)
Spoke with pt and advised her that order was sent to [facility]. Also advised that she will need to be seen in the office 30-90 days after she starts using her new machine. I have asked her to give [name] a call as soon as she receives her new machine to get scheduled and also to call [facility] if she hasn't heard from them in the next few days. Pt was very appreciative.

## 2021-11-12 NOTE — Assessment & Plan Note (Signed)
Right hip pain still present but improved considerably, return as needed.

## 2021-11-14 NOTE — Telephone Encounter (Signed)
Raeford Razor, RN So patient has a balance in collections that would need to be taken care of before we can process this order. I reached her and gave her the number to the billing department and she will call me back when she calls and makes a payment.,

## 2021-11-17 ENCOUNTER — Ambulatory Visit (INDEPENDENT_AMBULATORY_CARE_PROVIDER_SITE_OTHER): Payer: Medicare Other

## 2021-11-17 ENCOUNTER — Other Ambulatory Visit: Payer: Self-pay

## 2021-11-17 DIAGNOSIS — M25561 Pain in right knee: Secondary | ICD-10-CM | POA: Diagnosis not present

## 2021-11-17 DIAGNOSIS — R600 Localized edema: Secondary | ICD-10-CM | POA: Diagnosis not present

## 2021-11-17 DIAGNOSIS — M948X6 Other specified disorders of cartilage, lower leg: Secondary | ICD-10-CM | POA: Diagnosis not present

## 2021-11-17 DIAGNOSIS — M79651 Pain in right thigh: Secondary | ICD-10-CM | POA: Diagnosis not present

## 2021-11-17 DIAGNOSIS — G8929 Other chronic pain: Secondary | ICD-10-CM | POA: Diagnosis not present

## 2021-11-19 ENCOUNTER — Telehealth: Payer: Self-pay

## 2021-11-19 NOTE — Telephone Encounter (Signed)
We can discuss at upcoming appt

## 2021-11-19 NOTE — Telephone Encounter (Signed)
Communicated via mychart

## 2021-11-19 NOTE — Telephone Encounter (Signed)
Caller name:Liani Quentin Cornwall   On DPR? :Yes  Call back [place]  Provider they see: Birdie Riddle   Reason for call:Pt needs colonoscopy and mammogram

## 2021-11-23 ENCOUNTER — Ambulatory Visit (INDEPENDENT_AMBULATORY_CARE_PROVIDER_SITE_OTHER): Payer: Medicare Other

## 2021-11-23 ENCOUNTER — Other Ambulatory Visit: Payer: Self-pay

## 2021-11-23 ENCOUNTER — Ambulatory Visit (INDEPENDENT_AMBULATORY_CARE_PROVIDER_SITE_OTHER): Payer: Medicare Other | Admitting: Sports Medicine

## 2021-11-23 ENCOUNTER — Telehealth: Payer: Self-pay | Admitting: Sports Medicine

## 2021-11-23 DIAGNOSIS — G8929 Other chronic pain: Secondary | ICD-10-CM

## 2021-11-23 DIAGNOSIS — M1711 Unilateral primary osteoarthritis, right knee: Secondary | ICD-10-CM

## 2021-11-23 DIAGNOSIS — M25571 Pain in right ankle and joints of right foot: Secondary | ICD-10-CM

## 2021-11-23 DIAGNOSIS — M7989 Other specified soft tissue disorders: Secondary | ICD-10-CM | POA: Diagnosis not present

## 2021-11-23 MED ORDER — TRAMADOL HCL 50 MG PO TABS
50.0000 mg | ORAL_TABLET | Freq: Three times a day (TID) | ORAL | 0 refills | Status: DC | PRN
Start: 2021-11-23 — End: 2021-12-11

## 2021-11-23 NOTE — Telephone Encounter (Signed)
Please work on viscosupplementation approval, x-ray confirmed right knee osteoarthritis, failed everything including injections.  Failed greater than 6 weeks of physician directed therapy.

## 2021-11-23 NOTE — Assessment & Plan Note (Signed)
This is a pleasant 65 year old female, she has had chronic knee pain now for years, anterior, we have treated her since the fall of last year, she has had analgesics, injections, home therapy, no improvement. Ultimately we obtained an MRI, MRI showed only osteoarthritis, no meniscal tearing. Meloxicam is ineffective so we will add tramadol, and I am going to work on getting her approved for viscosupplementation.

## 2021-11-23 NOTE — Progress Notes (Signed)
    Procedures performed today:    None.  Independent interpretation of notes and tests performed by another provider:   None.  Brief History, Exam, Impression, and Recommendations:    Primary osteoarthritis of right knee This is a pleasant 65 year old female, she has had chronic knee pain now for years, anterior, we have treated her since the fall of last year, she has had analgesics, injections, home therapy, no improvement. Ultimately we obtained an MRI, MRI showed only osteoarthritis, no meniscal tearing. Meloxicam is ineffective so we will add tramadol, and I am going to work on getting her approved for viscosupplementation.  Right ankle pain New onset right ankle pain, adding some x-rays, the tramadol as below should help the ankle as well. We may consider orthotics in the future as pain was somewhat at the lateral talar dome.  Chronic process with exacerbation and pharmacologic intervention  ___________________________________________ Gwen Her. Dianah Field, M.D., ABFM., CAQSM. Primary Care and [place] Instructor of [place] of [facility] of Medicine

## 2021-11-23 NOTE — Assessment & Plan Note (Signed)
New onset right ankle pain, adding some x-rays, the tramadol as below should help the ankle as well. We may consider orthotics in the future as pain was somewhat at the lateral talar dome.

## 2021-11-28 NOTE — Telephone Encounter (Signed)
[employer] paperwork faxed to [employer] at [phone] Request is for Orthovisc Pt's insurance prefers Synvisc Fax confirmation receipt received

## 2021-11-30 ENCOUNTER — Encounter: Payer: Self-pay | Admitting: Family Medicine

## 2021-11-30 ENCOUNTER — Ambulatory Visit (INDEPENDENT_AMBULATORY_CARE_PROVIDER_SITE_OTHER): Payer: Medicare Other | Admitting: Family Medicine

## 2021-11-30 VITALS — BP 122/78 | HR 71 | Temp 97.2°F | Resp 16 | Wt 211.0 lb

## 2021-11-30 DIAGNOSIS — F332 Major depressive disorder, recurrent severe without psychotic features: Secondary | ICD-10-CM | POA: Diagnosis not present

## 2021-11-30 DIAGNOSIS — H43399 Other vitreous opacities, unspecified eye: Secondary | ICD-10-CM

## 2021-11-30 DIAGNOSIS — F331 Major depressive disorder, recurrent, moderate: Secondary | ICD-10-CM | POA: Diagnosis not present

## 2021-11-30 DIAGNOSIS — F411 Generalized anxiety disorder: Secondary | ICD-10-CM

## 2021-11-30 DIAGNOSIS — R233 Spontaneous ecchymoses: Secondary | ICD-10-CM

## 2021-11-30 LAB — CBC WITH DIFFERENTIAL/PLATELET
Basophils Absolute: 0 10*3/uL (ref 0.0–0.1)
Basophils Relative: 1.3 % (ref 0.0–3.0)
Eosinophils Absolute: 0.1 10*3/uL (ref 0.0–0.7)
Eosinophils Relative: 3.9 % (ref 0.0–5.0)
HCT: 41.7 % (ref 36.0–46.0)
Hemoglobin: 13.6 g/dL (ref 12.0–15.0)
Lymphocytes Relative: 26.1 % (ref 12.0–46.0)
Lymphs Abs: 0.8 10*3/uL (ref 0.7–4.0)
MCHC: 32.8 g/dL (ref 30.0–36.0)
MCV: 92.1 fl (ref 78.0–100.0)
Monocytes Absolute: 0.3 10*3/uL (ref 0.1–1.0)
Monocytes Relative: 9.6 % (ref 3.0–12.0)
Neutro Abs: 1.9 10*3/uL (ref 1.4–7.7)
Neutrophils Relative %: 59.1 % (ref 43.0–77.0)
Platelets: 221 10*3/uL (ref 150.0–400.0)
RBC: 4.53 Mil/uL (ref 3.87–5.11)
RDW: 12.7 % (ref 11.5–15.5)
WBC: 3.2 10*3/uL — ABNORMAL LOW (ref 4.0–10.5)

## 2021-11-30 LAB — HEPATIC FUNCTION PANEL
ALT: 10 U/L (ref 0–35)
AST: 13 U/L (ref 0–37)
Albumin: 4.3 g/dL (ref 3.5–5.2)
Alkaline Phosphatase: 68 U/L (ref 39–117)
Bilirubin, Direct: 0.1 mg/dL (ref 0.0–0.3)
Total Bilirubin: 0.6 mg/dL (ref 0.2–1.2)
Total Protein: 7.2 g/dL (ref 6.0–8.3)

## 2021-11-30 LAB — PROTIME-INR
INR: 1.1 ratio — ABNORMAL HIGH (ref 0.8–1.0)
Prothrombin Time: 12.1 s (ref 9.6–13.1)

## 2021-11-30 LAB — APTT: aPTT: 35.8 s — ABNORMAL HIGH (ref 23.4–32.7)

## 2021-11-30 MED ORDER — BUPROPION HCL ER (XL) 150 MG PO TB24: ORAL_TABLET | ORAL | 3 refills | Status: DC

## 2021-11-30 MED ORDER — BUPROPION HCL ER (XL) 300 MG PO TB24: 300.0000 mg | ORAL_TABLET | Freq: Every morning | ORAL | 3 refills | Status: DC

## 2021-11-30 MED ORDER — BUSPIRONE HCL 15 MG PO TABS: 15.0000 mg | ORAL_TABLET | Freq: Two times a day (BID) | ORAL | 1 refills | Status: DC

## 2021-11-30 NOTE — Assessment & Plan Note (Signed)
Deteriorated.  Pt's PHQ=13 today.  Pt states sxs are not severe, 'just a little funk'.  Currently on Wellbutrin 450mg  daily and Buspar 7.5mg  BID.  She would like to increase the Buspar to see if this improves her sxs.  New prescription for 15mg  BID sent to pharmacy.  Pt expressed understanding and is in agreement w/ plan.

## 2021-11-30 NOTE — Progress Notes (Signed)
Subjective:    Patient ID: Linda Buckley, female    DOB: 09-29-1956, 65 y.o.   MRN: [id number]  HPI Seeing spots- pt reports 'little black spots- they look like gnats'.  Sxs started a few weeks ago.  Sxs will come and go.  Some days she doesn't have any sxs.  Pt reports typically the sxs will resolve fairly quickly.  Sometimes occurs w/ position changes- getting up- but not always.  Pt reports poor water intake.  Last eye exam was July and everything WNL.  Not seeing any spots today.  Easy bruising- pt reports she has bruising of her legs but 'it's been like that a long time'.  Pt reports bruising will occur on legs or arms- never on trunk.  Started Meloxicam daily.  Pt is fearful of SLE as this runs in her family  Depression- pt's PHQ=13 today.  Currently on Wellbutrin and Buspar.  Pt denies 'bad thoughts'.  Pt just feels she's in 'a funk'.  Pt feels she would benefit from increased Buspar   Review of Systems For ROS see HPI   This visit occurred during the SARS-CoV-2 public health emergency.  Safety protocols were in place, including screening questions prior to the visit, additional usage of staff PPE, and extensive cleaning of exam room while observing appropriate contact time as indicated for disinfecting solutions.      Objective:   Physical Exam Vitals reviewed.  Constitutional:      General: She is not in acute distress.    Appearance: Normal appearance. She is well-developed. She is obese. She is not ill-appearing.  HENT:     Head: Normocephalic and atraumatic.  Eyes:     General: Lids are normal. Vision grossly intact.        Right eye: No discharge.        Left eye: No discharge.     Extraocular Movements: Extraocular movements intact.     Conjunctiva/sclera: Conjunctivae normal.     Pupils: Pupils are equal, round, and reactive to light.     Slit lamp exam:    Right eye: No photophobia.     Left eye: No photophobia.  Neck:     Thyroid: No thyromegaly.   Cardiovascular:     Rate and Rhythm: Normal rate and regular rhythm.     Heart sounds: Normal heart sounds. No murmur heard. Pulmonary:     Effort: Pulmonary effort is normal. No respiratory distress.     Breath sounds: Normal breath sounds.  Abdominal:     General: There is no distension.     Palpations: Abdomen is soft.     Tenderness: There is no abdominal tenderness.  Musculoskeletal:     Cervical back: Normal range of motion and neck supple.  Lymphadenopathy:     Cervical: No cervical adenopathy.  Skin:    General: Skin is warm and dry.     Findings: Bruising (small, faint bruising on bilateral lower legs) present.  Neurological:     Mental Status: She is alert and oriented to person, place, and time.  Psychiatric:        Mood and Affect: Mood normal.        Behavior: Behavior normal.        Thought Content: Thought content normal.          Assessment & Plan:  Spots- new.  Pt reports seeing spots like 'gnats' in her vision.  She has not seen any today and these come and go.  She is unclear if these only occur w/ position changes but admits to poor water intake.  Encouraged her to increase water intake and change positions slowly to avoid postural hypotension.  Also encouraged her to schedule w/ eye doctor.  Pt expressed understanding and is in agreement w/ plan.   Easy bruising- pt states this has been going on for awhile.  She never has bruising on trunk- primarily on legs.  Occasionally on arms.  Reviewed that some of her medications can cause increased bruising- particularly Meloxicam or other NSAIDs.  She is fearful of Lupus as this runs in the family.  Will check labs to r/o underlying cause of bruising.  Reassurance provided.

## 2021-11-30 NOTE — Patient Instructions (Signed)
Follow up in 6-8 weeks to recheck mood We'll notify you of your lab results and make any changes if needed Please increase your water intake and change positions slowly Call your eye doctor and let them know about your spots INCREASE the Buspirone (Buspar)- 2 of what you have at home and 1 of the new prescription when it comes in the mail Call with any questions or concerns Stay Safe!  Stay Healthy! Happy Holidays!

## 2021-12-03 LAB — ANA: Anti Nuclear Antibody (ANA): POSITIVE — AB

## 2021-12-03 LAB — ANTI-NUCLEAR AB-TITER (ANA TITER): ANA Titer 1: 1:40 {titer} — ABNORMAL HIGH

## 2021-12-08 ENCOUNTER — Other Ambulatory Visit: Payer: Self-pay | Admitting: Sports Medicine

## 2021-12-08 DIAGNOSIS — M5416 Radiculopathy, lumbar region: Secondary | ICD-10-CM

## 2021-12-10 ENCOUNTER — Other Ambulatory Visit: Payer: Self-pay | Admitting: Sports Medicine

## 2021-12-10 DIAGNOSIS — M1711 Unilateral primary osteoarthritis, right knee: Secondary | ICD-10-CM

## 2021-12-10 DIAGNOSIS — G4733 Obstructive sleep apnea (adult) (pediatric): Secondary | ICD-10-CM | POA: Diagnosis not present

## 2021-12-12 ENCOUNTER — Encounter: Payer: Self-pay | Admitting: Gastroenterology

## 2021-12-12 ENCOUNTER — Telehealth: Payer: Self-pay

## 2021-12-12 DIAGNOSIS — R768 Other specified abnormal immunological findings in serum: Secondary | ICD-10-CM

## 2021-12-12 MED ORDER — BUSPIRONE HCL 15 MG PO TABS: 15.0000 mg | ORAL_TABLET | Freq: Two times a day (BID) | ORAL | 1 refills | Status: DC

## 2021-12-12 NOTE — Telephone Encounter (Signed)
-----   Message from Midge Minium, MD sent at 12/04/2021 12:38 PM EST ----- Your ANA is just slightly positive and likely to be insignificant, but given the family hx of lupus, we should refer to Rheumatology for a complete evaluation

## 2021-12-12 NOTE — Progress Notes (Signed)
Patient aware of labs and referral. Referral placed and sent to [place] to sign.

## 2021-12-25 NOTE — Telephone Encounter (Signed)
New benefit year  [employer] paperwork faxed to [employer] at [phone] Request is for Orthovisc Pt's insurance prefers Synvisc Fax confirmation receipt received

## 2021-12-26 DIAGNOSIS — Z1231 Encounter for screening mammogram for malignant neoplasm of breast: Secondary | ICD-10-CM | POA: Diagnosis not present

## 2021-12-26 LAB — HM MAMMOGRAPHY

## 2021-12-28 ENCOUNTER — Other Ambulatory Visit: Payer: Self-pay | Admitting: Sports Medicine

## 2021-12-28 DIAGNOSIS — M1711 Unilateral primary osteoarthritis, right knee: Secondary | ICD-10-CM

## 2022-01-02 ENCOUNTER — Telehealth: Payer: Self-pay | Admitting: Sports Medicine

## 2022-01-02 DIAGNOSIS — M1711 Unilateral primary osteoarthritis, right knee: Secondary | ICD-10-CM

## 2022-01-02 MED ORDER — TRAMADOL HCL 50 MG PO TABS: ORAL_TABLET | ORAL | 0 refills | Status: DC

## 2022-01-02 NOTE — Telephone Encounter (Signed)
She is talking about her tramadol Rx that was denied.

## 2022-01-02 NOTE — Telephone Encounter (Signed)
While speaking with patient regarding the medication, she stated that she has new insurance for this year. Call was directly connected to [place] at the front desk to update patient's insurance info so we can work from correct information.

## 2022-01-02 NOTE — Telephone Encounter (Signed)
Pt wants to verify if her insurance co has approved her for the injection that you guys discussed at her last visit. She said she was also denied a refill for her RX. So she is currently without any relief from her pain.

## 2022-01-02 NOTE — Telephone Encounter (Signed)
Deferring to [facility] re: injections, I will refill tramadol.

## 2022-01-03 NOTE — Telephone Encounter (Signed)
Per [employer], insurance has been updated and verified.

## 2022-01-09 ENCOUNTER — Other Ambulatory Visit: Payer: Self-pay

## 2022-01-09 ENCOUNTER — Ambulatory Visit (AMBULATORY_SURGERY_CENTER): Payer: Medicare Other | Admitting: *Deleted

## 2022-01-09 VITALS — Ht 66.0 in | Wt 209.0 lb

## 2022-01-09 DIAGNOSIS — Z86010 Personal history of colonic polyps: Secondary | ICD-10-CM

## 2022-01-09 MED ORDER — PEG 3350-KCL-NA BICARB-NACL 420 G PO SOLR: 4000.0000 mL | Freq: Once | ORAL | 0 refills | Status: AC

## 2022-01-09 NOTE — Progress Notes (Signed)

## 2022-01-14 ENCOUNTER — Encounter: Payer: Self-pay | Admitting: Gastroenterology

## 2022-01-17 ENCOUNTER — Encounter: Payer: Self-pay | Admitting: Family Medicine

## 2022-01-18 ENCOUNTER — Encounter: Payer: Self-pay | Admitting: Gastroenterology

## 2022-01-18 ENCOUNTER — Other Ambulatory Visit: Payer: Self-pay

## 2022-01-18 ENCOUNTER — Ambulatory Visit: Payer: Medicare PPO | Admitting: Gastroenterology

## 2022-01-18 VITALS — BP 151/96 | HR 72 | Temp 96.4°F | Resp 20 | Ht 66.0 in | Wt 209.0 lb

## 2022-01-18 DIAGNOSIS — G4733 Obstructive sleep apnea (adult) (pediatric): Secondary | ICD-10-CM | POA: Diagnosis not present

## 2022-01-18 DIAGNOSIS — Z86010 Personal history of colonic polyps: Secondary | ICD-10-CM | POA: Diagnosis not present

## 2022-01-18 DIAGNOSIS — F419 Anxiety disorder, unspecified: Secondary | ICD-10-CM | POA: Diagnosis not present

## 2022-01-18 DIAGNOSIS — D122 Benign neoplasm of ascending colon: Secondary | ICD-10-CM

## 2022-01-18 MED ORDER — SODIUM CHLORIDE 0.9 % IV SOLN: 500.0000 mL | INTRAVENOUS | Status: DC

## 2022-01-18 NOTE — Patient Instructions (Signed)
HANDOUTS PROVIDED ON: POLYPS  The polyp removed today have been sent for pathology.  The results can take 1-3 weeks to receive.  When your next colonoscopy should occur will be based on the pathology results.    You may resume your previous diet and medication schedule.  Thank you for allowing [name] to care for you today!!!   YOU HAD AN ENDOSCOPIC PROCEDURE TODAY AT [facility]:   Refer to the procedure report that was given to you for any specific questions about what was found during the examination.  If the procedure report does not answer your questions, please call your gastroenterologist to clarify.  If you requested that your care partner not be given the details of your procedure findings, then the procedure report has been included in a sealed envelope for you to review at your convenience later.  YOU SHOULD EXPECT: Some feelings of bloating in the abdomen. Passage of more gas than usual.  Walking can help get rid of the air that was put into your GI tract during the procedure and reduce the bloating. If you had a lower endoscopy (such as a colonoscopy or flexible sigmoidoscopy) you may notice spotting of blood in your stool or on the toilet paper. If you underwent a bowel prep for your procedure, you may not have a normal bowel movement for a few days.  Please Note:  You might notice some irritation and congestion in your nose or some drainage.  This is from the oxygen used during your procedure.  There is no need for concern and it should clear up in a day or so.  SYMPTOMS TO REPORT IMMEDIATELY:  Following lower endoscopy (colonoscopy or flexible sigmoidoscopy):  Excessive amounts of blood in the stool  Significant tenderness or worsening of abdominal pains  Swelling of the abdomen that is new, acute  Fever of 100F or higher  For urgent or emergent issues, a gastroenterologist can be reached at any hour by calling [phone]. Do not use MyChart messaging for  urgent concerns.    DIET:  We do recommend a small meal at first, but then you may proceed to your regular diet.  Drink plenty of fluids but you should avoid alcoholic beverages for 24 hours.  ACTIVITY:  You should plan to take it easy for the rest of today and you should NOT DRIVE or use heavy machinery until tomorrow (because of the sedation medicines used during the test).    FOLLOW UP: Our staff will call the number listed on your records Tuesday morning between 7:15 am and 8:15 am following your procedure to check on you and address any questions or concerns that you may have regarding the information given to you following your procedure. If we do not reach you, we will leave a message.  We will attempt to reach you two times.  During this call, we will ask if you have developed any symptoms of COVID 19. If you develop any symptoms (ie: fever, flu-like symptoms, shortness of breath, cough etc.) before then, please call [phone].  If you test positive for Covid 19 in the 2 weeks post procedure, please call and report this information to [name].    If any biopsies were taken you will be contacted by phone or by letter within the next 1-3 weeks.  Please call us at [phone] if you have not heard about the biopsies in 3 weeks.    SIGNATURES/CONFIDENTIALITY: You and/or your care partner have signed paperwork  which will be entered into your electronic medical record.  These signatures attest to the fact that that the information above on your After Visit Summary has been reviewed and is understood.  Full responsibility of the confidentiality of this discharge information lies with you and/or your care-partner.

## 2022-01-18 NOTE — Progress Notes (Signed)
[facility] vital monitor in recovery bay 1 has an air leak in the cable for bp checks.  Using a portable bp monitor for this patient.

## 2022-01-18 NOTE — Progress Notes (Signed)
Report given to PACU, vss 

## 2022-01-18 NOTE — Progress Notes (Signed)
HPI: This is a woman with h/o polyps  04/2014 screening colonoscopy found a single subCM adenoma, otherwise normal.   ROS: complete GI ROS as described in HPI, all other review negative.  Constitutional:  No unintentional weight loss   Past Medical History:  Diagnosis Date   Allergy    Anxiety    Arthritis    Carpal tunnel syndrome of right wrist    RECURRENT   Depression    Dysphagia 09/02/2017   Edema 09/02/2017   Environmental allergies    dust mites, pollen, roaches and other insects, mold   Fibromyalgia 04/2016   Headache    migraines   Heart murmur    Hereditary and idiopathic peripheral neuropathy 04/03/2016   History of adenomatous polyp of colon    History of gastric ulcer    History of kidney stones    Hypertension    Pneumonia    Sleep apnea    wears CPAP   Wears glasses     Past Surgical History:  Procedure Laterality Date   CARPAL TUNNEL RELEASE Right 2007   CARPAL TUNNEL RELEASE Right 07/05/2015   Procedure: RIGHT HAND REVISION CARPAL TUNNEL RELEASE;  Surgeon: Iran Planas, MD;  Location: [facility];  Service: Orthopedics;  Laterality: Right;   CARPAL TUNNEL RELEASE Left 09/2016   CERVICAL DISC ARTHROPLASTY  03/2015   COLONOSCOPY W/ POLYPECTOMY  04/15/2014   CYSTO/  RIGHT RETROGRADE PYELOGRAM/ URETEROSCOPY STONE EXTRACTION/  STENT PLACEMENT  06/07/2010   FINGER ARTHRODESIS Right 08/10/2018   Procedure: RIGHT THUMB ARTHRODESIS;  Surgeon: Milly Jakob, MD;  Location: [facility];  Service: Orthopedics;  Laterality: Right;   LAPAROSCOPIC ASSISTED VAGINAL HYSTERECTOMY  04/25/2004   ovaries remain   LAPAROSCOPIC GASTRIC BAND REMOVAL WITH LAPAROSCOPIC GASTRIC SLEEVE RESECTION  10/26/2018   TUBAL LIGATION  1982   UPPER GASTROINTESTINAL ENDOSCOPY  12/08/2017    Current Outpatient Medications  Medication Sig Dispense Refill   albuterol (VENTOLIN HFA) 108 (90 Base) MCG/ACT inhaler INHALE 2 PUFFS BY MOUTH EVERY 4 HOURS AS NEEDED FOR SHORTNESS OF  BREATH (Patient not taking: Reported on 01/09/2022) 36 g 0   buPROPion (WELLBUTRIN XL) 150 MG 24 hr tablet TAKE 1 TABLET BY MOUTH  DAILY 90 tablet 3   buPROPion (WELLBUTRIN XL) 300 MG 24 hr tablet Take 1 tablet (300 mg total) by mouth every morning. 90 tablet 3   busPIRone (BUSPAR) 15 MG tablet Take 1 tablet (15 mg total) by mouth 2 (two) times daily. 180 tablet 1   cetirizine (ZYRTEC) 10 MG tablet TAKE 1 TABLET(10 MG) BY MOUTH DAILY 30 tablet 0   Cholecalciferol (VITAMIN D PO) Take 1 tablet by mouth daily. 1000units     Cholecalciferol (VITAMIN D3 PO) Take by mouth.     clonazePAM (KLONOPIN) 0.5 MG tablet TAKE 1 TABLET(0.5 MG) BY MOUTH AT BEDTIME 30 tablet 3   Cyanocobalamin (VITAMIN B-12 PO) Take by mouth.     gabapentin (NEURONTIN) 300 MG capsule TAKE ONE CAPSULE BY MOUTH AT BEDTIME FOR A WEEK. THEN TWO TIMES A DAY FOR A WEEK. THEN THREE TIMES A DAY. MAY DOUBLE WEEKLY TO A MAX OF 3600MG /DAY 90 capsule 3   Multiple Vitamin (MULTIVITAMIN) capsule Take 1 capsule by mouth daily. With iron     traMADol (ULTRAM) 50 MG tablet TAKE ONE TO TWO TABLETS BY MOUTH EVERY 8 HOURS  AS NEEDED FOR MODERATE PAIN, MAX DOSE OF 6 TABLETS PER DAY 30 tablet 0   triamcinolone ointment (KENALOG) 0.1 % Apply 1  application topically 2 (two) times daily. 90 g 1   VITAMIN E PO Take by mouth.     Current Facility-Administered Medications  Medication Dose Route Frequency Provider Last Rate Last Admin   0.9 %  sodium chloride infusion  500 mL Intravenous Continuous Milus Banister, MD        Allergies as of 01/18/2022 - Review Complete 01/18/2022  Allergen Reaction Noted   Aspirin Swelling 10/11/2013   Bee venom Swelling 07/04/2015    Family History  Problem Relation Age of Onset   Arthritis Mother    Hypertension Mother    Diabetes Mother    Cancer Mother    Stomach cancer Mother    Asthma Father    Diabetes Father    Diabetes Brother    Diabetes Brother    Cancer Maternal Aunt        breast   Cancer  Maternal Uncle        lung   Colon cancer Maternal Uncle    Cancer Maternal Grandmother        ovarian   Cancer Maternal Grandfather        colon   Colon cancer Maternal Grandfather    Asthma Son    Alcohol abuse Son    Anxiety disorder Son    Schizophrenia Son    Diabetes Son    Esophageal cancer Neg Hx     Social History   Socioeconomic History   Marital status: Married    Spouse name: Not on file   Number of children: 2   Years of education: 2 yrs college   Highest education level: Not on file  Occupational History   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, broadcasting  Tobacco Use   Smoking status: Never   Smokeless tobacco: Never  Vaping Use   Vaping Use: Never used  Substance and Sexual Activity   Alcohol use: Not Currently   Drug use: No   Sexual activity: Yes    Partners: Male    Birth control/protection: None    Comment: partial hysterectomy  Other Topics Concern   Not on file  Social History Narrative   Lives at home w/ her husband   Right-handed   Drinks 2 cups of coffee weekly    3 bottle water per day   Social Determinants of Health   Financial Resource Strain: Low Risk    Difficulty of Paying Living Expenses: Not very hard  Food Insecurity: No Food Insecurity   Worried About Charity fundraiser in the Last Year: Never true   Arboriculturist in the Last Year: Never true  Transportation Needs: Not on file  Physical Activity: Sufficiently Active   Days of Exercise per Week: 7 days   Minutes of Exercise per Session: 30 min  Stress: No Stress Concern Present   Feeling of Stress : Not at all  Social Connections: Moderately Integrated   Frequency of Communication with Friends and Family: More than three times a week   Frequency of Social Gatherings with Friends and Family: More than three times a week   Attends Religious Services: 1 to 4 times per year   Active Member of [employer] or Organizations: No   Attends Archivist Meetings: Never    Marital Status: Married  Human resources officer Violence: Not At Risk   Fear of Current or Ex-Partner: No   Emotionally Abused: No   Physically Abused: No   Sexually Abused: No  Physical Exam: BP 130/86    Pulse 60    Temp (!) 96.4 F (35.8 C) (Temporal)    Ht 5\' 6"  (1.676 m)    Wt 209 lb (94.8 kg)    SpO2 100%    BMI 33.73 kg/m  Constitutional: generally well-appearing Psychiatric: alert and oriented x3 Lungs: CTA bilaterally Heart: no MCR  Assessment and plan: 66 y.o. female with h/o polyps  Colonoscopy for polyp surveillance today.  Care is appropriate for the ambulatory setting.  Owens Loffler, MD [facility] 01/18/2022, 1:41 PM

## 2022-01-18 NOTE — Progress Notes (Signed)
Called to room to assist during endoscopic procedure.  Patient ID and intended procedure confirmed with present staff. Received instructions for my participation in the procedure from the performing physician.  

## 2022-01-18 NOTE — Telephone Encounter (Signed)
Benefits Investigation Details received from [employer] Injection: Orthovisc  Medical: Deductible does not apply. Once OOP is met, patient is covered at 100%. Only one copy applies per date of service. Prior authorization is not required.  PA required: No  Pharmacy: Benefits are currently undisclosed until a prior authorization has been obtained for the drug, the procedure. To initiate, call at [phone].   Specialty Pharmacy: [facility]  May fill through: [facility]aryn Lull and [place] Copay/Coinsurance:  Product Copay: 20% Administration Coinsurance:  Administration Copay: $45 Deductible: None Out of Pocket Max: $8300 (met: $0)    Spoke with [employer], who stated that no PA was needed for orthovisc as it was the preferred agent.

## 2022-01-18 NOTE — Progress Notes (Signed)
Pt's states no medical or surgical changes since previsit or office visit. 

## 2022-01-18 NOTE — Op Note (Signed)
[facility] Patient Name: Linda Buckley Procedure Date: 01/18/2022 1:38 PM MRN: [id number] Endoscopist: Milus Banister , MD Age: 66 Referring MD:  Date of Birth: [birth year] Gender: Female Account #: [id number] Procedure:                Colonoscopy Indications:              High risk colon cancer surveillance: Personal                            history of colonic polyps; 04/2014 screening                            colonoscopy found a single subCM adenoma, otherwise                            normal. Medicines:                Monitored Anesthesia Care Procedure:                Pre-Anesthesia Assessment:                           - Prior to the procedure, a History and Physical                            was performed, and patient medications and                            allergies were reviewed. The patient's tolerance of                            previous anesthesia was also reviewed. The risks                            and benefits of the procedure and the sedation                            options and risks were discussed with the patient.                            All questions were answered, and informed consent                            was obtained. Prior Anticoagulants: The patient has                            taken no previous anticoagulant or antiplatelet                            agents. ASA Grade Assessment: II - A patient with                            mild systemic disease. After reviewing the risks  and benefits, the patient was deemed in                            satisfactory condition to undergo the procedure.                           After obtaining informed consent, the colonoscope                            was passed under direct vision. Throughout the                            procedure, the patient's blood pressure, pulse, and                            oxygen saturations were monitored continuously. The                             CF [id number] #[id number] was introduced through the anus                            and advanced to the the cecum, identified by                            appendiceal orifice and ileocecal valve. The                            colonoscopy was performed without difficulty. The                            patient tolerated the procedure well. The quality                            of the bowel preparation was good. The ileocecal                            valve, appendiceal orifice, and rectum were                            photographed. Scope In: 1:54:20 PM Scope Out: 2:08:37 PM Scope Withdrawal Time: 0 hours 11 minutes 1 second  Total Procedure Duration: 0 hours 14 minutes 17 seconds  Findings:                 A 2 mm polyp was found in the ascending colon. The                            polyp was sessile. The polyp was removed with a                            cold snare. Resection and retrieval were complete.                           The exam was otherwise without abnormality on  direct and retroflexion views. Complications:            No immediate complications. Estimated blood loss:                            None. Estimated Blood Loss:     Estimated blood loss: none. Impression:               - One 2 mm polyp in the ascending colon, removed                            with a cold snare. Resected and retrieved.                           - The examination was otherwise normal on direct                            and retroflexion views. Recommendation:           - Patient has a contact number available for                            emergencies. The signs and symptoms of potential                            delayed complications were discussed with the                            patient. Return to normal activities tomorrow.                            Written discharge instructions were provided to the                            patient.                            - Resume previous diet.                           - Continue present medications.                           - Await pathology results. Milus Banister, MD 01/18/2022 2:18:13 PM This report has been signed electronically.

## 2022-01-22 ENCOUNTER — Telehealth: Payer: Self-pay | Admitting: *Deleted

## 2022-01-22 NOTE — Telephone Encounter (Signed)
°  Follow up Call-  Call back number 01/18/2022  Post procedure Call Back phone  # [phone]  Permission to leave phone message Yes  Some recent data might be hidden     Patient questions:  Do you have a fever, pain , or abdominal swelling? No. Pain Score  0 *  Have you tolerated food without any problems? Yes.    Have you been able to return to your normal activities? Yes.    Do you have any questions about your discharge instructions: Diet   No. Medications  No. Follow up visit  No.  Do you have questions or concerns about your Care? No.  Actions: * If pain score is 4 or above: No action needed, pain <4.

## 2022-01-23 ENCOUNTER — Encounter: Payer: Self-pay | Admitting: Family Medicine

## 2022-01-23 ENCOUNTER — Encounter: Payer: Self-pay | Admitting: Gastroenterology

## 2022-01-23 ENCOUNTER — Ambulatory Visit (INDEPENDENT_AMBULATORY_CARE_PROVIDER_SITE_OTHER): Payer: Medicare PPO | Admitting: Family Medicine

## 2022-01-23 VITALS — BP 122/80 | HR 68 | Temp 98.4°F | Resp 16 | Ht 66.0 in | Wt 214.0 lb

## 2022-01-23 DIAGNOSIS — F331 Major depressive disorder, recurrent, moderate: Secondary | ICD-10-CM

## 2022-01-23 DIAGNOSIS — E669 Obesity, unspecified: Secondary | ICD-10-CM

## 2022-01-23 DIAGNOSIS — E559 Vitamin D deficiency, unspecified: Secondary | ICD-10-CM | POA: Diagnosis not present

## 2022-01-23 DIAGNOSIS — Z0000 Encounter for general adult medical examination without abnormal findings: Secondary | ICD-10-CM | POA: Diagnosis not present

## 2022-01-23 LAB — CBC WITH DIFFERENTIAL/PLATELET
Basophils Absolute: 0 10*3/uL (ref 0.0–0.1)
Basophils Relative: 1.3 % (ref 0.0–3.0)
Eosinophils Absolute: 0.1 10*3/uL (ref 0.0–0.7)
Eosinophils Relative: 3.1 % (ref 0.0–5.0)
HCT: 39.2 % (ref 36.0–46.0)
Hemoglobin: 12.5 g/dL (ref 12.0–15.0)
Lymphocytes Relative: 22.9 % (ref 12.0–46.0)
Lymphs Abs: 0.7 10*3/uL (ref 0.7–4.0)
MCHC: 31.9 g/dL (ref 30.0–36.0)
MCV: 90.4 fl (ref 78.0–100.0)
Monocytes Absolute: 0.3 10*3/uL (ref 0.1–1.0)
Monocytes Relative: 8.2 % (ref 3.0–12.0)
Neutro Abs: 2.1 10*3/uL (ref 1.4–7.7)
Neutrophils Relative %: 64.5 % (ref 43.0–77.0)
Platelets: 231 10*3/uL (ref 150.0–400.0)
RBC: 4.33 Mil/uL (ref 3.87–5.11)
RDW: 13.2 % (ref 11.5–15.5)
WBC: 3.2 10*3/uL — ABNORMAL LOW (ref 4.0–10.5)

## 2022-01-23 LAB — BASIC METABOLIC PANEL
BUN: 13 mg/dL (ref 6–23)
CO2: 31 mEq/L (ref 19–32)
Calcium: 9.9 mg/dL (ref 8.4–10.5)
Chloride: 104 mEq/L (ref 96–112)
Creatinine, Ser: 1.06 mg/dL (ref 0.40–1.20)
GFR: 55.99 mL/min — ABNORMAL LOW (ref >60.00)
Glucose, Bld: 83 mg/dL (ref 70–99)
Potassium: 4.1 mEq/L (ref 3.5–5.1)
Sodium: 143 mEq/L (ref 135–145)

## 2022-01-23 LAB — LIPID PANEL
Cholesterol: 178 mg/dL (ref 0–200)
HDL: 70.7 mg/dL (ref >39.00)
LDL Cholesterol: 94 mg/dL (ref 0–99)
NonHDL: 107.78
Total CHOL/HDL Ratio: 3
Triglycerides: 70 mg/dL (ref 0.0–149.0)
VLDL: 14 mg/dL (ref 0.0–40.0)

## 2022-01-23 LAB — HEPATIC FUNCTION PANEL
ALT: 11 U/L (ref 0–35)
AST: 13 U/L (ref 0–37)
Albumin: 3.9 g/dL (ref 3.5–5.2)
Alkaline Phosphatase: 63 U/L (ref 39–117)
Bilirubin, Direct: 0.1 mg/dL (ref 0.0–0.3)
Total Bilirubin: 0.6 mg/dL (ref 0.2–1.2)
Total Protein: 6.6 g/dL (ref 6.0–8.3)

## 2022-01-23 LAB — TSH: TSH: 0.5 u[IU]/mL (ref 0.35–5.50)

## 2022-01-23 LAB — VITAMIN D 25 HYDROXY (VIT D DEFICIENCY, FRACTURES): VITD: 37.58 ng/mL (ref 30.00–100.00)

## 2022-01-23 NOTE — Patient Instructions (Signed)
Follow up in 1 year or as needed We'll notify you of your lab results and make any changes if needed Continue to work on healthy diet and regular exercise- you can do it! Call with any questions or concerns Stay Safe!  Stay Healthy! Happy Valentine's Day!!!

## 2022-01-23 NOTE — Assessment & Plan Note (Signed)
Pt's PE WNL w/ exception of obesity.  UTD on DEXA, mammo, Tdap, colonoscopy.  Check labs.  Anticipatory guidance provided.

## 2022-01-23 NOTE — Assessment & Plan Note (Signed)
Check labs and replete prn. 

## 2022-01-23 NOTE — Assessment & Plan Note (Signed)
Ongoing issue for pt.  BMI 34.54  She is not able to exercise due to ongoing issues w/ R knee.  She ambulates w/ a cane.  Encouraged low carb diet.  Check labs to risk stratify.  Will follow.

## 2022-01-23 NOTE — Progress Notes (Signed)
Subjective:    Patient ID: Linda Buckley, female    DOB: Mar 16, 1956, 66 y.o.   MRN: [id number]  HPI CPE- UTD on DEXA, mammo, Tdap, colonoscopy.  Pt's only complaints today are fatigue and headaches (sinus- needs to restart Zyrtec)  Patient Care Team    Relationship Specialty Notifications Start End  Midge Minium, MD PCP - General Family Medicine  01/12/14   Iran Planas, MD Consulting Physician Orthopedic Surgery  10/23/15   Suella Broad, MD Consulting Physician Physical Medicine and Rehabilitation  10/23/15   Melina Schools, MD Consulting Physician Orthopedic Surgery  04/03/16   Milus Banister, MD Attending Physician Gastroenterology  11/05/17   Kathrynn Ducking, MD (Inactive) Consulting Physician Neurology  11/05/17   Erma Heritage, PA-C Physician Assistant Physician Assistant  11/05/17   Ward Givens, NP Registered Nurse Gerontology  11/05/17   Lorretta Harp, PA-C Physician Assistant Orthopedic Surgery  12/01/17   Madelin Rear, [facility] Pharmacist Pharmacist  04/14/20    Comment: PHONE NUMBER [phone]    Health Maintenance  Topic Date Due   Zoster Vaccines- Shingrix (1 of 2) Never done   COVID-19 Vaccine (4 - Booster for [place] series) 12/07/2021   Hepatitis C Screening  05/21/2022 (Originally 10/08/1974)   DEXA SCAN  04/21/2022   MAMMOGRAM  12/26/2022   TETANUS/TDAP  11/06/2027   COLONOSCOPY (Pts 45-73yrs Insurance coverage will need to be confirmed)  01/18/2029   INFLUENZA VACCINE  Completed   HIV Screening  Completed   HPV VACCINES  Aged Out      Review of Systems Patient reports no vision/ hearing changes, adenopathy,fever, weight change,  persistant/recurrent hoarseness , swallowing issues, chest pain, palpitations, edema, persistant/recurrent cough, hemoptysis, dyspnea (rest/exertional/paroxysmal nocturnal), gastrointestinal bleeding (melena, rectal bleeding), abdominal pain, significant heartburn, bowel changes, GU symptoms (dysuria, hematuria,  incontinence), Gyn symptoms (abnormal  bleeding, pain),  syncope, focal weakness, memory loss, skin/hair/nail changes, abnormal bruising or bleeding, anxiety, or depression.   + numbness/tingling of hands and feet  This visit occurred during the SARS-CoV-2 public health emergency.  Safety protocols were in place, including screening questions prior to the visit, additional usage of staff PPE, and extensive cleaning of exam room while observing appropriate contact time as indicated for disinfecting solutions.      Objective:   Physical Exam General Appearance:    Alert, cooperative, no distress, appears stated age, obese  Head:    Normocephalic, without obvious abnormality, atraumatic  Eyes:    PERRL, conjunctiva/corneas clear, EOM's intact, fundi    benign, both eyes  Ears:    Normal TM's and external ear canals, both ears  Nose:   Deferred due to COVID  Throat:   Neck:   Supple, symmetrical, trachea midline, no adenopathy;    Thyroid: no enlargement/tenderness/nodules  Back:     Symmetric, no curvature, ROM normal, no CVA tenderness  Lungs:     Clear to auscultation bilaterally, respirations unlabored  Chest Wall:    No tenderness or deformity   Heart:    Regular rate and rhythm, S1 and S2 normal, no murmur, rub   or gallop  Breast Exam:    Deferred to mammo  Abdomen:     Soft, non-tender, bowel sounds active all four quadrants,    no masses, no organomegaly  Genitalia:    Deferred to GYN  Rectal:    Extremities:   Extremities normal, atraumatic, no cyanosis or edema  Pulses:   2+ and symmetric all extremities  Skin:  Skin color, texture, turgor normal, no rashes or lesions  Lymph nodes:   Cervical, supraclavicular, and axillary nodes normal  Neurologic:   CNII-XII intact, normal strength, sensation and reflexes    throughout          Assessment & Plan:

## 2022-01-23 NOTE — Assessment & Plan Note (Signed)
Pt's PHQ score improved since last visit.  Will continue to monitor.  No med changes at this time.

## 2022-01-24 ENCOUNTER — Other Ambulatory Visit: Payer: Self-pay

## 2022-01-24 ENCOUNTER — Emergency Department (INDEPENDENT_AMBULATORY_CARE_PROVIDER_SITE_OTHER)
Admission: EM | Admit: 2022-01-24 | Discharge: 2022-01-24 | Disposition: A | Discharge status: Home / Self Care | Payer: Medicare PPO | Source: Home / Self Care | Attending: Family Medicine | Admitting: Family Medicine

## 2022-01-24 DIAGNOSIS — R7303 Prediabetes: Secondary | ICD-10-CM | POA: Diagnosis not present

## 2022-01-24 DIAGNOSIS — G8929 Other chronic pain: Secondary | ICD-10-CM | POA: Diagnosis not present

## 2022-01-24 DIAGNOSIS — Z903 Acquired absence of stomach [part of]: Secondary | ICD-10-CM | POA: Diagnosis not present

## 2022-01-24 DIAGNOSIS — M1711 Unilateral primary osteoarthritis, right knee: Secondary | ICD-10-CM

## 2022-01-24 DIAGNOSIS — G4733 Obstructive sleep apnea (adult) (pediatric): Secondary | ICD-10-CM | POA: Diagnosis not present

## 2022-01-24 DIAGNOSIS — M25561 Pain in right knee: Secondary | ICD-10-CM | POA: Diagnosis not present

## 2022-01-24 MED ORDER — TRAMADOL HCL 50 MG PO TABS: ORAL_TABLET | ORAL | 0 refills | Status: DC

## 2022-01-24 NOTE — ED Provider Notes (Signed)
[facility]    CSN: [id number] Arrival date & time: 01/24/22  0954      History   Chief Complaint Chief Complaint  Patient presents with   Knee Pain    rt    HPI CHRISS REDEL is a 66 y.o. female.   HPI Patient has multiple medical problems.  She also has chronic pain in her right knee.  She is under the care of Dr. Dianah Field.  They are awaiting approval for viscosupplementation.  She is out of tramadol.  She called his office today and is unable to be seen.  She is here for pain management.  She is requesting a brace. I did check with Dr. Landry Corporal office.  His representative came over and discussed Visco shots with Ms. Quentin Cornwall  Past Medical History:  Diagnosis Date   Allergy    Anxiety    Arthritis    Carpal tunnel syndrome of right wrist    RECURRENT   Depression    Dysphagia 09/02/2017   Edema 09/02/2017   Environmental allergies    dust mites, pollen, roaches and other insects, mold   Fibromyalgia 04/2016   Headache    migraines   Heart murmur    Hereditary and idiopathic peripheral neuropathy 04/03/2016   History of adenomatous polyp of colon    History of gastric ulcer    History of kidney stones    Hypertension    Pneumonia    Sleep apnea    wears CPAP   Wears glasses     Patient Active Problem List   Diagnosis Date Noted   Right ankle pain 11/23/2021   Primary osteoarthritis of right knee 08/16/2021   Lumbar spondylosis 07/03/2021   Poor compliance with CPAP treatment 02/01/2020   OSA on CPAP 02/01/2020   Chronic left shoulder pain 09/17/2019   MDD (major depressive disorder), recurrent episode, moderate ([place]) 05/05/2018   Vitamin D deficiency 11/05/2017   Dysphagia 09/02/2017   Multiple falls 09/04/2016   Hereditary and idiopathic peripheral neuropathy 04/03/2016   Exertional shortness of breath 01/26/2016   Left leg pain 01/26/2016   Greater trochanteric bursitis, right 12/19/2015   Fibromyalgia 07/28/2015   Right  wrist pain 04/05/2015   Physical exam 03/01/2014   HTN (hypertension) 01/12/2014   Obesity (BMI 30-39.9) 01/12/2014   Pre-diabetes 01/12/2014   Family history of lupus erythematosus 01/12/2014   Fatigue 10/17/2013    Past Surgical History:  Procedure Laterality Date   CARPAL TUNNEL RELEASE Right 2007   CARPAL TUNNEL RELEASE Right 07/05/2015   Procedure: RIGHT HAND REVISION CARPAL TUNNEL RELEASE;  Surgeon: Iran Planas, MD;  Location: [facility];  Service: Orthopedics;  Laterality: Right;   CARPAL TUNNEL RELEASE Left 09/2016   CERVICAL DISC ARTHROPLASTY  03/2015   COLONOSCOPY W/ POLYPECTOMY  04/15/2014   CYSTO/  RIGHT RETROGRADE PYELOGRAM/ URETEROSCOPY STONE EXTRACTION/  STENT PLACEMENT  06/07/2010   FINGER ARTHRODESIS Right 08/10/2018   Procedure: RIGHT THUMB ARTHRODESIS;  Surgeon: Milly Jakob, MD;  Location: [facility];  Service: Orthopedics;  Laterality: Right;   LAPAROSCOPIC ASSISTED VAGINAL HYSTERECTOMY  04/25/2004   ovaries remain   LAPAROSCOPIC GASTRIC BAND REMOVAL WITH LAPAROSCOPIC GASTRIC SLEEVE RESECTION  10/26/2018   TUBAL LIGATION  1982   UPPER GASTROINTESTINAL ENDOSCOPY  12/08/2017    OB History   No obstetric history on file.      Home Medications    Prior to Admission medications   Medication Sig Start Date End Date Taking? Authorizing Provider  buPROPion (WELLBUTRIN XL) 150 MG 24 hr tablet TAKE 1 TABLET BY MOUTH  DAILY 11/30/21   Midge Minium, MD  buPROPion (WELLBUTRIN XL) 300 MG 24 hr tablet Take 1 tablet (300 mg total) by mouth every morning. 11/30/21   Midge Minium, MD  busPIRone (BUSPAR) 15 MG tablet Take 1 tablet (15 mg total) by mouth 2 (two) times daily. 12/12/21   Midge Minium, MD  cetirizine (ZYRTEC) 10 MG tablet TAKE 1 TABLET(10 MG) BY MOUTH DAILY 07/25/20   Brunetta Jeans, PA-C  Cholecalciferol (VITAMIN D PO) Take 1 tablet by mouth daily. 1000units    [provider]  Cholecalciferol (VITAMIN D3 PO) Take  by mouth.    [provider]  clonazePAM (KLONOPIN) 0.5 MG tablet TAKE 1 TABLET(0.5 MG) BY MOUTH AT BEDTIME 08/15/21   Dutch Quint B, FNP  Cyanocobalamin (VITAMIN B-12 PO) Take by mouth.    [provider]  gabapentin (NEURONTIN) 300 MG capsule TAKE ONE CAPSULE BY MOUTH AT BEDTIME FOR A WEEK. THEN TWO TIMES A DAY FOR A WEEK. THEN THREE TIMES A DAY. MAY DOUBLE WEEKLY TO A MAX OF 3600MG /DAY 12/12/21   Silverio Decamp, MD  Multiple Vitamin (MULTIVITAMIN) capsule Take 1 capsule by mouth daily. With iron    [provider]  traMADol (ULTRAM) 50 MG tablet TAKE ONE TO TWO TABLETS BY MOUTH EVERY 8 HOURS  AS NEEDED FOR MODERATE PAIN, MAX DOSE OF 6 TABLETS PER DAY 01/24/22   Raylene Everts, MD  triamcinolone ointment (KENALOG) 0.1 % Apply 1 application topically 2 (two) times daily. 02/05/21 02/05/22  Midge Minium, MD  VITAMIN E PO Take by mouth.    [provider]    Family History Family History  Problem Relation Age of Onset   Arthritis Mother    Hypertension Mother    Diabetes Mother    Cancer Mother    Stomach cancer Mother    Asthma Father    Diabetes Father    Diabetes Brother    Diabetes Brother    Cancer Maternal Aunt        breast   Cancer Maternal Uncle        lung   Colon cancer Maternal Uncle    Cancer Maternal Grandmother        ovarian   Cancer Maternal Grandfather        colon   Colon cancer Maternal Grandfather    Asthma Son    Alcohol abuse Son    Anxiety disorder Son    Schizophrenia Son    Diabetes Son    Esophageal cancer Neg Hx     Social History Social History   Tobacco Use   Smoking status: Never   Smokeless tobacco: Never  Vaping Use   Vaping Use: Never used  Substance Use Topics   Alcohol use: Not Currently   Drug use: No     Allergies   Aspirin and Bee venom   Review of Systems Review of Systems See HPI  Physical Exam Triage Vital Signs ED Triage Vitals  Enc Vitals Group     BP  01/24/22 1041 (!) 128/92     Pulse Rate 01/24/22 1041 62     Resp 01/24/22 1041 16     Temp 01/24/22 1041 99 F (37.2 C)     Temp Source 01/24/22 1041 Oral     SpO2 01/24/22 1041 99 %     Weight --      Height --  Head Circumference --      Peak Flow --      Pain Score 01/24/22 1042 10     Pain Loc --      Pain Edu? --      Excl. in [place]? --    No data found.  Updated Vital Signs BP (!) 128/92 (BP Location: Right Arm)    Pulse 62    Temp 99 F (37.2 C) (Oral)    Resp 16    SpO2 99%       Physical Exam Constitutional:      General: She is not in acute distress.    Appearance: She is well-developed. She is obese.     Comments: Uncomfortable.  Antalgic gait  HENT:     Head: Normocephalic and atraumatic.  Eyes:     Conjunctiva/sclera: Conjunctivae normal.     Pupils: Pupils are equal, round, and reactive to light.  Cardiovascular:     Rate and Rhythm: Normal rate.  Pulmonary:     Effort: Pulmonary effort is normal. No respiratory distress.  Abdominal:     General: There is no distension.     Palpations: Abdomen is soft.  Musculoskeletal:        General: Normal range of motion.     Cervical back: Normal range of motion.     Comments: Left knee has warmth.  Trace effusion.  Medial joint line tenderness.  Limited range of motion  Skin:    General: Skin is warm and dry.  Neurological:     Mental Status: She is alert.     Gait: Gait abnormal.  Psychiatric:        Mood and Affect: Mood normal.        Behavior: Behavior normal.     UC Treatments / Results  Labs (all labs ordered are listed, but only abnormal results are displayed) Labs Reviewed - No data to display  EKG   Radiology No results found.  Procedures Procedures (including critical care time)  Medications Ordered in UC Medications - No data to display  Initial Impression / Assessment and Plan / UC Course  I have reviewed the triage vital signs and the nursing notes.  Pertinent labs &  imaging results that were available during my care of the patient were reviewed by me and considered in my medical decision making (see chart for details).     I explained to the patient that I cannot going to change the management of Dr. Dianah Field, and that brace would come from his office.  I will refill her Toradol.  Her last prescription was on 01/02/2022 for 30 tablets. Final Clinical Impressions(s) / UC Diagnoses   Final diagnoses:  Chronic pain of right knee     Discharge Instructions      Limit walking while knee is painful May use ice or heat to knee You need to see Dr. Dianah Field in follow-up for additional treatment and for brace fitting I have refilled your tramadol.  You may take 1 or 2 pills, 3 times a day for maximum of 6 a day   ED Prescriptions     Medication Sig Dispense Auth. Provider   traMADol (ULTRAM) 50 MG tablet TAKE ONE TO TWO TABLETS BY MOUTH EVERY 8 HOURS  AS NEEDED FOR MODERATE PAIN, MAX DOSE OF 6 TABLETS PER DAY 30 tablet Raylene Everts, MD      I have reviewed the PDMP during this encounter.   Raylene Everts, MD 01/24/22 1444

## 2022-01-24 NOTE — Telephone Encounter (Signed)
Spoke with patient in an UC exam room regarding her cost for the orthovisc injections. She stated that she wants to proceed. Advised her that the front desk staff would be in touch to get her scheduled.   Please call patient and get her scheduled for orthovisc with Dr. Darene Lamer at her earliest convenience.

## 2022-01-24 NOTE — Telephone Encounter (Signed)
LVM for pt to call back to schedule an appt.  

## 2022-01-24 NOTE — Discharge Instructions (Signed)
Limit walking while knee is painful May use ice or heat to knee You need to see Dr. Dianah Field in follow-up for additional treatment and for brace fitting I have refilled your tramadol.  You may take 1 or 2 pills, 3 times a day for maximum of 6 a day

## 2022-01-24 NOTE — ED Triage Notes (Signed)
Pt presents with rt knee/hip/leg pain that began in October but has worsened over the last month. Pt currently being treated by Dr. Darene Lamer.

## 2022-01-30 NOTE — Telephone Encounter (Signed)
Patient scheduled for 02/04/22 for gel injection.

## 2022-02-04 ENCOUNTER — Ambulatory Visit (INDEPENDENT_AMBULATORY_CARE_PROVIDER_SITE_OTHER): Payer: Medicare PPO | Admitting: Sports Medicine

## 2022-02-04 ENCOUNTER — Ambulatory Visit (INDEPENDENT_AMBULATORY_CARE_PROVIDER_SITE_OTHER): Payer: Medicare PPO

## 2022-02-04 ENCOUNTER — Other Ambulatory Visit: Payer: Self-pay

## 2022-02-04 DIAGNOSIS — M1711 Unilateral primary osteoarthritis, right knee: Secondary | ICD-10-CM | POA: Diagnosis not present

## 2022-02-04 NOTE — Progress Notes (Signed)
° ° °  Procedures performed today:    Procedure: Real-time Ultrasound Guided injection of the right knee Device: Samsung HS60  Verbal informed consent obtained.  Time-out conducted.  Noted no overlying erythema, induration, or other signs of local infection.  Skin prepped in a sterile fashion.  Local anesthesia: Topical Ethyl chloride.  With sterile technique and under real time ultrasound guidance: 30 mg/2 mL of OrthoVisc (sodium hyaluronate) in a prefilled syringe was injected easily into the knee through a 22-gauge needle. Completed without difficulty  Advised to call if fevers/chills, erythema, induration, drainage, or persistent bleeding.  Images permanently stored and available for review in PACS.  Impression: Technically successful ultrasound guided injection.  Independent interpretation of notes and tests performed by another provider:   None.  Brief History, Exam, Impression, and Recommendations:    Primary osteoarthritis of right knee Orthovisc No. 1 of 4 right knee, return in 1 week for #2 of 4. Historically MRI only showed osteoarthritis without meniscal tearing, did not respond sufficiently to meloxicam or tramadol. We also gave her a reaction knee brace today.    ___________________________________________ Gwen Her. Dianah Field, M.D., ABFM., CAQSM. Primary Care and [place] In[employer] of [employer] [facility] [employer]

## 2022-02-04 NOTE — Assessment & Plan Note (Addendum)
Orthovisc No. 1 of 4 right knee, return in 1 week for #2 of 4. Historically MRI only showed osteoarthritis without meniscal tearing, did not respond sufficiently to meloxicam or tramadol. We also gave her a reaction knee brace today.

## 2022-02-05 DIAGNOSIS — Z6833 Body mass index (BMI) 33.0-33.9, adult: Secondary | ICD-10-CM | POA: Diagnosis not present

## 2022-02-05 DIAGNOSIS — E669 Obesity, unspecified: Secondary | ICD-10-CM | POA: Diagnosis not present

## 2022-02-05 DIAGNOSIS — Z9884 Bariatric surgery status: Secondary | ICD-10-CM | POA: Diagnosis not present

## 2022-02-07 ENCOUNTER — Ambulatory Visit (INDEPENDENT_AMBULATORY_CARE_PROVIDER_SITE_OTHER): Payer: Medicare PPO

## 2022-02-07 DIAGNOSIS — Z0000 Encounter for general adult medical examination without abnormal findings: Secondary | ICD-10-CM | POA: Diagnosis not present

## 2022-02-07 NOTE — Progress Notes (Signed)
Subjective:   Linda Buckley is a 66 y.o. female who presents for an Subsequent  Medicare Annual Wellness Visit. I connected with Wendall Papa today by telephone and verified that I am speaking with the correct person using two identifiers. Location patient: home Location provider: work Persons participating in the virtual visit: patient, provider.   I discussed the limitations, risks, security and privacy concerns of performing an evaluation and management service by telephone and the availability of in person appointments. I also discussed with the patient that there may be a patient responsible charge related to this service. The patient expressed understanding and verbally consented to this telephonic visit.    Interactive audio and video telecommunications were attempted between this provider and patient, however failed, due to patient having technical difficulties OR patient did not have access to video capability.  We continued and completed visit with audio only.    Review of Systems    Cardiac Risk Factors include: advanced age (>52men, >34 women);hypertension;dyslipidemia     Objective:    Today's Vitals   There is no height or weight on file to calculate BMI.  Advanced Directives 02/07/2022 07/05/2021 02/05/2021 08/10/2018 08/04/2018 05/04/2018 12/08/2017  Does Patient Have a Medical Advance Directive? Yes No No No No No No  Type of Advance Directive [place]  Does patient want to make changes to medical advance directive? - - - - - - -  Copy of [place] in Chart? No - copy requested - - - - - -  Would patient like information on creating a medical advance directive? - No - Patient declined Yes (MAU/Ambulatory/Procedural Areas - Information given) No - Patient declined No - Patient declined No - Patient declined -  Pre-existing out of facility DNR order (yellow form or pink MOST form) - - - - - - -  Some encounter  information is confidential and restricted. Go to Review Flowsheets activity to see all data.    Current Medications (verified) Outpatient Encounter Medications as of 02/07/2022  Medication Sig   buPROPion (WELLBUTRIN XL) 150 MG 24 hr tablet TAKE 1 TABLET BY MOUTH  DAILY   buPROPion (WELLBUTRIN XL) 300 MG 24 hr tablet Take 1 tablet (300 mg total) by mouth every morning.   busPIRone (BUSPAR) 15 MG tablet Take 1 tablet (15 mg total) by mouth 2 (two) times daily.   cetirizine (ZYRTEC) 10 MG tablet TAKE 1 TABLET(10 MG) BY MOUTH DAILY   Cholecalciferol (VITAMIN D PO) Take 1 tablet by mouth daily. 1000units   Cholecalciferol (VITAMIN D3 PO) Take by mouth.   clonazePAM (KLONOPIN) 0.5 MG tablet TAKE 1 TABLET(0.5 MG) BY MOUTH AT BEDTIME   Cyanocobalamin (VITAMIN B-12 PO) Take by mouth.   gabapentin (NEURONTIN) 300 MG capsule TAKE ONE CAPSULE BY MOUTH AT BEDTIME FOR A WEEK. THEN TWO TIMES A DAY FOR A WEEK. THEN THREE TIMES A DAY. MAY DOUBLE WEEKLY TO A MAX OF 3600MG /DAY   Multiple Vitamin (MULTIVITAMIN) capsule Take 1 capsule by mouth daily. With iron   traMADol (ULTRAM) 50 MG tablet TAKE ONE TO TWO TABLETS BY MOUTH EVERY 8 HOURS  AS NEEDED FOR MODERATE PAIN, MAX DOSE OF 6 TABLETS PER DAY   VITAMIN E PO Take by mouth.   No facility-administered encounter medications on file as of 02/07/2022.    Allergies (verified) Aspirin and Bee venom   History: Past Medical History:  Diagnosis Date   Allergy  Anxiety    Arthritis    Carpal tunnel syndrome of right wrist    RECURRENT   Depression    Dysphagia 09/02/2017   Edema 09/02/2017   Environmental allergies    dust mites, pollen, roaches and other insects, mold   Fibromyalgia 04/2016   Headache    migraines   Heart murmur    Hereditary and idiopathic peripheral neuropathy 04/03/2016   History of adenomatous polyp of colon    History of gastric ulcer    History of kidney stones    Hypertension    Pneumonia    Sleep apnea    wears CPAP    Wears glasses    Past Surgical History:  Procedure Laterality Date   CARPAL TUNNEL RELEASE Right 2007   CARPAL TUNNEL RELEASE Right 07/05/2015   Procedure: RIGHT HAND REVISION CARPAL TUNNEL RELEASE;  Surgeon: Iran Planas, MD;  Location: [facility];  Service: Orthopedics;  Laterality: Right;   CARPAL TUNNEL RELEASE Left 09/2016   CERVICAL DISC ARTHROPLASTY  03/2015   COLONOSCOPY W/ POLYPECTOMY  04/15/2014   CYSTO/  RIGHT RETROGRADE PYELOGRAM/ URETEROSCOPY STONE EXTRACTION/  STENT PLACEMENT  06/07/2010   FINGER ARTHRODESIS Right 08/10/2018   Procedure: RIGHT THUMB ARTHRODESIS;  Surgeon: Milly Jakob, MD;  Location: [facility];  Service: Orthopedics;  Laterality: Right;   LAPAROSCOPIC ASSISTED VAGINAL HYSTERECTOMY  04/25/2004   ovaries remain   LAPAROSCOPIC GASTRIC BAND REMOVAL WITH LAPAROSCOPIC GASTRIC SLEEVE RESECTION  10/26/2018   TUBAL LIGATION  1982   UPPER GASTROINTESTINAL ENDOSCOPY  12/08/2017   Family History  Problem Relation Age of Onset   Arthritis Mother    Hypertension Mother    Diabetes Mother    Cancer Mother    Stomach cancer Mother    Asthma Father    Diabetes Father    Diabetes Brother    Diabetes Brother    Cancer Maternal Aunt        breast   Cancer Maternal Uncle        lung   Colon cancer Maternal Uncle    Cancer Maternal Grandmother        ovarian   Cancer Maternal Grandfather        colon   Colon cancer Maternal Grandfather    Asthma Son    Alcohol abuse Son    Anxiety disorder Son    Schizophrenia Son    Diabetes Son    Esophageal cancer Neg Hx    Social History   Socioeconomic History   Marital status: Married    Spouse name: Not on file   Number of children: 2   Years of education: 2 yrs college   Highest education level: Not on file  Occupational History   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, broadcasting  Tobacco Use   Smoking status: Never   Smokeless tobacco: Never  Vaping Use   Vaping Use: Never used  Substance  and Sexual Activity   Alcohol use: Not Currently   Drug use: No   Sexual activity: Yes    Partners: Male    Birth control/protection: None    Comment: partial hysterectomy  Other Topics Concern   Not on file  Social History Narrative   Lives at home w/ her husband   Right-handed   Drinks 2 cups of coffee weekly    3 bottle water per day   Social Determinants of Health   Financial Resource Strain: Low Risk    Difficulty of Paying Living Expenses:  Not hard at all  Food Insecurity: No Food Insecurity   Worried About Charity fundraiser in the Last Year: Never true   Ran Out of Food in the Last Year: Never true  Transportation Needs: No Transportation Needs   Lack of Transportation (Medical): No   Lack of Transportation (Non-Medical): No  Physical Activity: Inactive   Days of Exercise per Week: 0 days   Minutes of Exercise per Session: 0 min  Stress: No Stress Concern Present   Feeling of Stress : Not at all  Social Connections: Moderately Integrated   Frequency of Communication with Friends and Family: Twice a week   Frequency of Social Gatherings with Friends and Family: Twice a week   Attends Religious Services: More than 4 times per year   Active Member of [employer] or Organizations: No   Attends Music therapist: Never   Marital Status: Married    Tobacco Counseling Counseling given: Not Answered   Clinical Intake:  Pre-visit preparation completed: Yes  Pain : No/denies pain     Nutritional Risks: None Diabetes: No  How often do you need to have someone help you when you read instructions, pamphlets, or other written materials from your doctor or pharmacy?: 1 - Never What is the last grade level you completed in school?: college  Diabetic?no   Interpreter Needed?: No  Information entered by :: [place] of Daily Living In your present state of health, do you have any difficulty performing the following activities: 02/07/2022  01/23/2022  Hearing? N N  Vision? N N  Difficulty concentrating or making decisions? N N  Walking or climbing stairs? N Y  Dressing or bathing? N N  Doing errands, shopping? N N  Preparing Food and eating ? N -  Using the Toilet? N -  In the past six months, have you accidently leaked urine? N -  Do you have problems with loss of bowel control? N -  Managing your Medications? N -  Managing your Finances? N -  Housekeeping or managing your Housekeeping? N -  Some recent data might be hidden    Patient Care Team: Midge Minium, MD as PCP - General (Family Medicine) Iran Planas, MD as Consulting Physician (Orthopedic Surgery) Suella Broad, MD as Consulting Physician (Physical Medicine and Rehabilitation) Melina Schools, MD as Consulting Physician (Orthopedic Surgery) Milus Banister, MD as Attending Physician (Gastroenterology) Kathrynn Ducking, MD (Inactive) as Consulting Physician (Neurology) Ahmed Prima Neta Ehlers as Physician Assistant (Physician Assistant) Ward Givens, NP as Registered Nurse (Gerontology) Lorretta Harp, PA-C as Physician Assistant (Orthopedic Surgery) Madelin Rear, [facility] as Pharmacist (Pharmacist)  Indicate any recent Medical Services you may have received from other than [facility] providers in the past year (date may be approximate).     Assessment:   This is a routine wellness examination for [place].  Hearing/Vision screen No results found.  Dietary issues and exercise activities discussed: Current Exercise Habits: Home exercise routine, Type of exercise: walking, Time (Minutes): 15, Frequency (Times/Week): 3, Weekly Exercise (Minutes/Week): 45, Intensity: Mild, Exercise limited by: None identified   Goals Addressed             This Visit's Progress    Patient Stated   On track    Eat healthier , drink more water & increase activity       Depression Screen PHQ 2/9 Scores 02/07/2022 02/07/2022 01/23/2022 11/30/2021 06/22/2021 05/21/2021  02/05/2021  PHQ - 2 Score 0 0 0 2  0 0 0  PHQ- 9 Score - - 7 13 0 0 -  Exception Documentation - - - - - - -  Some encounter information is confidential and restricted. Go to Review Flowsheets activity to see all data.    Fall Risk Fall Risk  02/07/2022 01/23/2022 11/30/2021 06/22/2021 05/21/2021  Falls in the past year? 1 1 1  0 1  Number falls in past yr: 0 1 1 0 1  Comment - - - - -  Injury with Fall? 0 0 0 0 0  Risk for fall due to : No Fall Risks History of fall(s);Impaired balance/gait History of fall(s) No Fall Risks Impaired mobility  Risk for fall due to: Comment - - - - -  Follow up Falls evaluation completed Falls evaluation completed Falls evaluation completed - -    FALL RISK PREVENTION PERTAINING TO THE HOME:  Any stairs in or around the home? Yes  If so, are there any without handrails? No  Home free of loose throw rugs in walkways, pet beds, electrical cords, etc? Yes  Adequate lighting in your home to reduce risk of falls? Yes   ASSISTIVE DEVICES UTILIZED TO PREVENT FALLS:  Life alert? No  Use of a cane, walker or w/c? Yes  Grab bars in the bathroom? Yes  Shower chair or bench in shower? No  Elevated toilet seat or a handicapped toilet? No   Cognitive Function: Normal cognitive status assessed by direct observation by this Nurse Health Advisor. No abnormalities found.   MMSE - Mini Mental State Exam 02/24/2020 02/01/2019  Orientation to time 5 5  Orientation to Place 5 5  Registration 3 3  Attention/ Calculation 1 5  Recall 1 2  Language- name 2 objects 2 2  Language- repeat 1 0  Language- follow 3 step command 3 3  Language- read & follow direction 1 1  Write a sentence 1 1  Copy design 1 1  Total score 24 28     6CIT Screen 02/05/2021  What Year? 0 points  What month? 3 points  What time? 0 points  Count back from 20 0 points  Months in reverse 0 points  Repeat phrase 0 points  Total Score 3    Immunizations Immunization History  Administered  Date(s) Administered   Influenza,inj,Quad PF,6+ Mos 09/15/2014, 10/24/2015, 09/08/2017, 01/29/2019   Influenza-Unspecified 12/25/2020, 10/12/2021   Moderna SARS-COV2 Booster Vaccination 10/12/2021   PFIZER(Purple Top)SARS-COV-2 Vaccination 02/29/2020, 03/21/2020, 10/10/2020   Tdap 11/05/2017    TDAP status: Up to date  Flu Vaccine status: Up to date  Pneumococcal vaccine status: Up to date  Covid-19 vaccine status: Completed vaccines  Qualifies for Shingles Vaccine? Yes   Zostavax completed No   Shingrix Completed?: No.    Education has been provided regarding the importance of this vaccine. Patient has been advised to call insurance company to determine out of pocket expense if they have not yet received this vaccine. Advised may also receive vaccine at local pharmacy or Health Dept. Verbalized acceptance and understanding.  Screening Tests Health Maintenance  Topic Date Due   Zoster Vaccines- Shingrix (1 of 2) Never done   COVID-19 Vaccine (4 - Booster for Pfizer series) 12/07/2021   Hepatitis C Screening  05/21/2022 (Originally 10/08/1974)   DEXA SCAN  04/21/2022   MAMMOGRAM  12/26/2022   TETANUS/TDAP  11/06/2027   COLONOSCOPY (Pts 45-43yrs Insurance coverage will need to be confirmed)  01/18/2029   INFLUENZA VACCINE  Completed   HIV Screening  Completed   HPV VACCINES  Aged Out    Health Maintenance  Health Maintenance Due  Topic Date Due   Zoster Vaccines- Shingrix (1 of 2) Never done   COVID-19 Vaccine (4 - Booster for Pfizer series) 12/07/2021    Colorectal cancer screening: Type of screening: Colonoscopy. Completed 01/18/2022. Repeat every 7 years  Mammogram status: Completed 12/26/2021. Repeat every year  Bone Density status: Completed 04/21/2020. Results reflect: Bone density results: OSTEOPENIA. Repeat every 5 years.  Lung Cancer Screening: (Low Dose CT Chest recommended if Age 9-80 years, 30 pack-year currently smoking OR have quit w/in 15years.) does not  qualify.   Lung Cancer Screening Referral: n/a  Additional Screening:  Hepatitis C Screening: does qualify;   Vision Screening: Recommended annual ophthalmology exams for early detection of glaucoma and other disorders of the eye. Is the patient up to date with their annual eye exam?  Yes  Who is the provider or what is the name of the office in which the patient attends annual eye exams? [facility]  If pt is not established with a provider, would they like to be referred to a provider to establish care? No .   Dental Screening: Recommended annual dental exams for proper oral hygiene  Community Resource Referral / Chronic Care Management: CRR required this visit?  No   CCM required this visit?  No      Plan:     I have personally reviewed and noted the following in the patients chart:   Medical and social history Use of alcohol, tobacco or illicit drugs  Current medications and supplements including opioid prescriptions. Patient is not currently taking opioid prescriptions. Functional ability and status Nutritional status Physical activity Advanced directives List of other physicians Hospitalizations, surgeries, and ER visits in previous 12 months Vitals Screenings to include cognitive, depression, and falls Referrals and appointments  In addition, I have reviewed and discussed with patient certain preventive protocols, quality metrics, and best practice recommendations. A written personalized care plan for preventive services as well as general preventive health recommendations were provided to patient.     Randel Pigg, LPN   [id number]   Nurse Notes: none

## 2022-02-07 NOTE — Patient Instructions (Signed)
Linda Buckley , Thank you for taking time to come for your Medicare Wellness Visit. I appreciate your ongoing commitment to your health goals. Please review the following plan we discussed and let me know if I can assist you in the future.   Screening recommendations/referrals: Colonoscopy: 01/18/2022  due 2030 Mammogram: 12/26/2021 Bone Density: 04/21/2020 Recommended yearly ophthalmology/optometry visit for glaucoma screening and checkup Recommended yearly dental visit for hygiene and checkup  Vaccinations: Influenza vaccine: completed  Pneumococcal vaccine: completed  Tdap vaccine: 11/05/2017 Shingles vaccine: will consider     Advanced directives: yes   Conditions/risks identified: none   Next appointment: none    Preventive Care 64 Years and Older, Female Preventive care refers to lifestyle choices and visits with your health care provider that can promote health and wellness. What does preventive care include? A yearly physical exam. This is also called an annual well check. Dental exams once or twice a year. Routine eye exams. Ask your health care provider how often you should have your eyes checked. Personal lifestyle choices, including: Daily care of your teeth and gums. Regular physical activity. Eating a healthy diet. Avoiding tobacco and drug use. Limiting alcohol use. Practicing safe sex. Taking low-dose aspirin every day. Taking vitamin and mineral supplements as recommended by your health care provider. What happens during an annual well check? The services and screenings done by your health care provider during your annual well check will depend on your age, overall health, lifestyle risk factors, and family history of disease. Counseling  Your health care provider may ask you questions about your: Alcohol use. Tobacco use. Drug use. Emotional well-being. Home and relationship well-being. Sexual activity. Eating habits. History of falls. Memory and  ability to understand (cognition). Work and work Statistician. Reproductive health. Screening  You may have the following tests or measurements: Height, weight, and BMI. Blood pressure. Lipid and cholesterol levels. These may be checked every 5 years, or more frequently if you are over 58 years old. Skin check. Lung cancer screening. You may have this screening every year starting at age 64 if you have a 30-pack-year history of smoking and currently smoke or have quit within the past 15 years. Fecal occult blood test (FOBT) of the stool. You may have this test every year starting at age 64. Flexible sigmoidoscopy or colonoscopy. You may have a sigmoidoscopy every 5 years or a colonoscopy every 10 years starting at age 53. Hepatitis C blood test. Hepatitis B blood test. Sexually transmitted disease (STD) testing. Diabetes screening. This is done by checking your blood sugar (glucose) after you have not eaten for a while (fasting). You may have this done every 1-3 years. Bone density scan. This is done to screen for osteoporosis. You may have this done starting at age 64. Mammogram. This may be done every 1-2 years. Talk to your health care provider about how often you should have regular mammograms. Talk with your health care provider about your test results, treatment options, and if necessary, the need for more tests. Vaccines  Your health care provider may recommend certain vaccines, such as: Influenza vaccine. This is recommended every year. Tetanus, diphtheria, and acellular pertussis (Tdap, Td) vaccine. You may need a Td booster every 10 years. Zoster vaccine. You may need this after age 64. Pneumococcal 13-valent conjugate (PCV13) vaccine. One dose is recommended after age 64. Pneumococcal polysaccharide (PPSV23) vaccine. One dose is recommended after age 64. Talk to your health care provider about which screenings and vaccines you need  and how often you need them. This information is  not intended to replace advice given to you by your health care provider. Make sure you discuss any questions you have with your health care provider. Document Released: 12/29/2015 Document Revised: 08/21/2016 Document Reviewed: 10/03/2015 Elsevier Interactive Patient Education  2017 [place] Prevention in the Home Falls can cause injuries. They can happen to people of all ages. There are many things you can do to make your home safe and to help prevent falls. What can I do on the outside of my home? Regularly fix the edges of walkways and driveways and fix any cracks. Remove anything that might make you trip as you walk through a door, such as a raised step or threshold. Trim any bushes or trees on the path to your home. Use bright outdoor lighting. Clear any walking paths of anything that might make someone trip, such as rocks or tools. Regularly check to see if handrails are loose or broken. Make sure that both sides of any steps have handrails. Any raised decks and porches should have guardrails on the edges. Have any leaves, snow, or ice cleared regularly. Use sand or salt on walking paths during winter. Clean up any spills in your garage right away. This includes oil or grease spills. What can I do in the bathroom? Use night lights. Install grab bars by the toilet and in the tub and shower. Do not use towel bars as grab bars. Use non-skid mats or decals in the tub or shower. If you need to sit down in the shower, use a plastic, non-slip stool. Keep the floor dry. Clean up any water that spills on the floor as soon as it happens. Remove soap buildup in the tub or shower regularly. Attach bath mats securely with double-sided non-slip rug tape. Do not have throw rugs and other things on the floor that can make you trip. What can I do in the bedroom? Use night lights. Make sure that you have a light by your bed that is easy to reach. Do not use any sheets or blankets that  are too big for your bed. They should not hang down onto the floor. Have a firm chair that has side arms. You can use this for support while you get dressed. Do not have throw rugs and other things on the floor that can make you trip. What can I do in the kitchen? Clean up any spills right away. Avoid walking on wet floors. Keep items that you use a lot in easy-to-reach places. If you need to reach something above you, use a strong step stool that has a grab bar. Keep electrical cords out of the way. Do not use floor polish or wax that makes floors slippery. If you must use wax, use non-skid floor wax. Do not have throw rugs and other things on the floor that can make you trip. What can I do with my stairs? Do not leave any items on the stairs. Make sure that there are handrails on both sides of the stairs and use them. Fix handrails that are broken or loose. Make sure that handrails are as long as the stairways. Check any carpeting to make sure that it is firmly attached to the stairs. Fix any carpet that is loose or worn. Avoid having throw rugs at the top or bottom of the stairs. If you do have throw rugs, attach them to the floor with carpet tape. Make sure that you  have a light switch at the top of the stairs and the bottom of the stairs. If you do not have them, ask someone to add them for you. What else can I do to help prevent falls? Wear shoes that: Do not have high heels. Have rubber bottoms. Are comfortable and fit you well. Are closed at the toe. Do not wear sandals. If you use a stepladder: Make sure that it is fully opened. Do not climb a closed stepladder. Make sure that both sides of the stepladder are locked into place. Ask someone to hold it for you, if possible. Clearly mark and make sure that you can see: Any grab bars or handrails. First and last steps. Where the edge of each step is. Use tools that help you move around (mobility aids) if they are needed. These  include: Canes. Walkers. Scooters. Crutches. Turn on the lights when you go into a dark area. Replace any light bulbs as soon as they burn out. Set up your furniture so you have a clear path. Avoid moving your furniture around. If any of your floors are uneven, fix them. If there are any pets around you, be aware of where they are. Review your medicines with your doctor. Some medicines can make you feel dizzy. This can increase your chance of falling. Ask your doctor what other things that you can do to help prevent falls. This information is not intended to replace advice given to you by your health care provider. Make sure you discuss any questions you have with your health care provider. Document Released: 09/28/2009 Document Revised: 05/09/2016 Document Reviewed: 01/06/2015 Elsevier Interactive Patient Education  2017 [employer].

## 2022-02-11 ENCOUNTER — Ambulatory Visit (INDEPENDENT_AMBULATORY_CARE_PROVIDER_SITE_OTHER): Payer: Medicare PPO | Admitting: Sports Medicine

## 2022-02-11 ENCOUNTER — Encounter: Payer: Medicare Other | Admitting: Family Medicine

## 2022-02-11 ENCOUNTER — Ambulatory Visit (INDEPENDENT_AMBULATORY_CARE_PROVIDER_SITE_OTHER): Payer: Medicare PPO

## 2022-02-11 ENCOUNTER — Ambulatory Visit: Payer: Medicare Other

## 2022-02-11 ENCOUNTER — Other Ambulatory Visit: Payer: Self-pay

## 2022-02-11 DIAGNOSIS — M1711 Unilateral primary osteoarthritis, right knee: Secondary | ICD-10-CM

## 2022-02-11 NOTE — Progress Notes (Signed)
° ° °  Procedures performed today:      Procedure: Real-time Ultrasound Guided injection of the right knee Device: Samsung HS60  Verbal informed consent obtained.  Time-out conducted.  Noted no overlying erythema, induration, or other signs of local infection.  Skin prepped in a sterile fashion.  Local anesthesia: Topical Ethyl chloride.  With sterile technique and under real time ultrasound guidance: No effusion noted, 30 mg/2 mL of OrthoVisc (sodium hyaluronate) in a prefilled syringe was injected easily into the knee through a 22-gauge needle. Completed without difficulty  Advised to call if fevers/chills, erythema, induration, drainage, or persistent bleeding.  Images permanently stored and available for review in PACS.  Impression: Technically successful ultrasound guided injection.  Independent interpretation of notes and tests performed by another provider:   None.  Brief History, Exam, Impression, and Recommendations:    Primary osteoarthritis of right knee Orthovisc No. 2 of 4 right knee, return in 1 week for #3 of 4. Of note MRI historically showed osteoarthritis without meniscal tearing.    ___________________________________________ Gwen Her. Dianah Field, M.D., ABFM., CAQSM. Primary Care and [place] Instructor of [place] [employer]

## 2022-02-11 NOTE — Assessment & Plan Note (Signed)
Orthovisc No. 2 of 4 right knee, return in 1 week for #3 of 4. Of note MRI historically showed osteoarthritis without meniscal tearing.

## 2022-02-14 ENCOUNTER — Ambulatory Visit: Payer: Medicare Other

## 2022-02-18 ENCOUNTER — Telehealth: Payer: Self-pay

## 2022-02-18 ENCOUNTER — Ambulatory Visit (INDEPENDENT_AMBULATORY_CARE_PROVIDER_SITE_OTHER): Payer: Medicare PPO | Admitting: Sports Medicine

## 2022-02-18 ENCOUNTER — Other Ambulatory Visit: Payer: Self-pay

## 2022-02-18 ENCOUNTER — Ambulatory Visit (INDEPENDENT_AMBULATORY_CARE_PROVIDER_SITE_OTHER): Payer: Medicare PPO

## 2022-02-18 DIAGNOSIS — M1711 Unilateral primary osteoarthritis, right knee: Secondary | ICD-10-CM | POA: Diagnosis not present

## 2022-02-18 DIAGNOSIS — R768 Other specified abnormal immunological findings in serum: Secondary | ICD-10-CM

## 2022-02-18 NOTE — Progress Notes (Signed)
? ? ?  Procedures performed today:   ? ?Procedure: Real-time Ultrasound Guided injection of the right knee ?Device: Samsung HS60  ?Verbal informed consent obtained.  ?Time-out conducted.  ?Noted no overlying erythema, induration, or other signs of local infection.  ?Skin prepped in a sterile fashion.  ?Local anesthesia: Topical Ethyl chloride.  ?With sterile technique and under real time ultrasound guidance: No effusion noted, 30 mg/2 mL of OrthoVisc (sodium hyaluronate) in a prefilled syringe was injected easily into the knee through a 22-gauge needle. ?Completed without difficulty  ?Advised to call if fevers/chills, erythema, induration, drainage, or persistent bleeding.  ?Images permanently stored and available for review in PACS.  ?Impression: Technically successful ultrasound guided injection. ? ?Independent interpretation of notes and tests performed by another provider:  ? ?None. ? ?Brief History, Exam, Impression, and Recommendations:   ? ?Primary osteoarthritis of right knee ?Orthovisc 3 of 4 right knee, return in 1 week for #4 of 4, of note MRI historically has shown osteoarthritis without meniscal tearing. ? ? ? ?___________________________________________ ?Gwen Her. Dianah Field, M.D., ABFM., CAQSM. ?Primary Care and Sports Medicine ?[place] ? ?Adjunct Instructor of Family Medicine  ?[employer] ?

## 2022-02-18 NOTE — Telephone Encounter (Deleted)
Caller name:Linda Buckley  ? ?On DPR? :Yes ? ?Call back number:[phone] ? ?Provider they see: Ta ? ?Reason for call: ? ?

## 2022-02-18 NOTE — Assessment & Plan Note (Signed)
Orthovisc 3 of 4 right knee, return in 1 week for #4 of 4, of note MRI historically has shown osteoarthritis without meniscal tearing. ?

## 2022-02-18 NOTE — Telephone Encounter (Signed)
Caller name:Shametra Woodring  ? ?On DPR? :Yes ? ?Call back number:[phone] ? ?Provider they see: Birdie Riddle  ? ?Reason for call:PT called pt needs new referral to a new rheumatologist pt was sent to [facility] she is not longer seeing new patients and then the pt was sent to [facility] in the same office and right before her appt he had to take medical leave. Pt wants a new referral to a Rheumatologist near [place] ? ?

## 2022-02-20 ENCOUNTER — Ambulatory Visit: Payer: Medicare Other | Admitting: Rheumatology

## 2022-02-20 NOTE — Addendum Note (Signed)
Addended by: Midge Minium on: 02/20/2022 03:46 PM ? ? Modules accepted: Orders ? ?

## 2022-02-21 ENCOUNTER — Encounter: Payer: Self-pay | Admitting: Family Medicine

## 2022-02-21 ENCOUNTER — Ambulatory Visit (INDEPENDENT_AMBULATORY_CARE_PROVIDER_SITE_OTHER): Payer: Medicare PPO | Admitting: Family Medicine

## 2022-02-21 VITALS — BP 120/78 | HR 79 | Temp 98.7°F | Resp 16 | Wt 213.4 lb

## 2022-02-21 DIAGNOSIS — M797 Fibromyalgia: Secondary | ICD-10-CM

## 2022-02-21 MED ORDER — DULOXETINE HCL 60 MG PO CPEP: ORAL_CAPSULE | ORAL | 3 refills | Status: DC

## 2022-02-21 MED ORDER — PREDNISONE 10 MG PO TABS: ORAL_TABLET | ORAL | 0 refills | Status: DC

## 2022-02-21 NOTE — Patient Instructions (Signed)
Follow up as needed or as scheduled ?START the Prednisone as directed- take w/ food.  3 pills at the same time x3 days, then 2 pills at the same time x3 days, etc ?CONTINUE the Gabapentin as directed ?RESTART the Cymbalta (Duloxetine) once daily x2 weeks and then increase to twice daily ?Call with any questions or concerns ?Hang in there!!! ?

## 2022-02-21 NOTE — Progress Notes (Signed)
? ?  Subjective:  ? ? Patient ID: Linda Buckley, female    DOB: [birth year], 66 y.o.   MRN: [id number] ? ?HPI ?Pain- 'i'm just hurting a whole lot'.  Pain along arms bilaterally, hips and thighs bilaterally, shoulders and neck, and R abd.  Sxs started ~2-3 weeks ago but have been worsening.  Recently stopped Lyrica and switched to Gabapentin.  Was previously on Cymbalta but stopped this a week ago.  Pt was on '60mg'$  BID.  Has constant HA.   ? ? ?Review of Systems ?For ROS see HPI  ? ?This visit occurred during the SARS-CoV-2 public health emergency.  Safety protocols were in place, including screening questions prior to the visit, additional usage of staff PPE, and extensive cleaning of exam room while observing appropriate contact time as indicated for disinfecting solutions.   ?   ?Objective:  ? Physical Exam ?Vitals reviewed.  ?Constitutional:   ?   General: She is not in acute distress. ?   Appearance: She is well-developed. She is not ill-appearing.  ?HENT:  ?   Head: Normocephalic and atraumatic.  ?Musculoskeletal:     ?   General: Tenderness (TTP over multiple trigger points- bilateral upper arms, bilateral forearms, bilateral shoulder blades, neck, bilateral thighs) present. No swelling or deformity.  ?   Right lower leg: No tenderness.  ?Skin: ?   General: Skin is warm and dry.  ?   Findings: No erythema.  ?Neurological:  ?   General: No focal deficit present.  ?   Mental Status: She is alert and oriented to person, place, and time.  ?Psychiatric:     ?   Mood and Affect: Mood normal.     ?   Behavior: Behavior normal.  ? ? ? ? ? ?   ?Assessment & Plan:  ? ? ?

## 2022-02-22 NOTE — Assessment & Plan Note (Signed)
Deteriorated.  Pt stopped her high dose Cymbalta ('60mg'$  BID) ~1 week ago.  This considerably worsened her pain and I suspect she is also having some withdrawal symptoms.  Restart Cymbalta '60mg'$  daily x2 weeks and then increase back to BID.  Given her current flare, will do low dose Prednisone taper to improve her pain.  Pt expressed understanding and is in agreement w/ plan.  ?

## 2022-02-25 ENCOUNTER — Ambulatory Visit (INDEPENDENT_AMBULATORY_CARE_PROVIDER_SITE_OTHER): Payer: Medicare PPO | Admitting: Sports Medicine

## 2022-02-25 ENCOUNTER — Ambulatory Visit (INDEPENDENT_AMBULATORY_CARE_PROVIDER_SITE_OTHER): Payer: Medicare PPO

## 2022-02-25 ENCOUNTER — Other Ambulatory Visit: Payer: Self-pay

## 2022-02-25 DIAGNOSIS — M1711 Unilateral primary osteoarthritis, right knee: Secondary | ICD-10-CM

## 2022-02-25 NOTE — Assessment & Plan Note (Signed)
Orthovisc 4 of 4 right knee, return as needed.  She is doing well. ?MRI historically showed osteoarthritis without meniscal tearing. ?

## 2022-02-25 NOTE — Progress Notes (Signed)
? ? ?  Procedures performed today:   ? ?Procedure: Real-time Ultrasound Guided injection of the right knee ?Device: Samsung HS60  ?Verbal informed consent obtained.  ?Time-out conducted.  ?Noted no overlying erythema, induration, or other signs of local infection.  ?Skin prepped in a sterile fashion.  ?Local anesthesia: Topical Ethyl chloride.  ?With sterile technique and under real time ultrasound guidance: No effusion noted, 30 mg/2 mL of OrthoVisc (sodium hyaluronate) in a prefilled syringe was injected easily into the knee through a 22-gauge needle. ?Completed without difficulty  ?Advised to call if fevers/chills, erythema, induration, drainage, or persistent bleeding.  ?Images permanently stored and available for review in PACS.  ?Impression: Technically successful ultrasound guided injection. ? ?Independent interpretation of notes and tests performed by another provider:  ? ?None. ? ?Brief History, Exam, Impression, and Recommendations:   ? ?Primary osteoarthritis of right knee ?Orthovisc 4 of 4 right knee, return as needed.  She is doing well. ?MRI historically showed osteoarthritis without meniscal tearing. ? ? ? ?___________________________________________ ?Gwen Her. Dianah Field, M.D., ABFM., CAQSM. ?Primary Care and Sports Medicine ?[place] ? ?Adjunct Instructor of Family Medicine  ?[employer] ?

## 2022-03-04 DIAGNOSIS — Z6833 Body mass index (BMI) 33.0-33.9, adult: Secondary | ICD-10-CM | POA: Diagnosis not present

## 2022-03-04 DIAGNOSIS — E669 Obesity, unspecified: Secondary | ICD-10-CM | POA: Diagnosis not present

## 2022-03-04 DIAGNOSIS — M170 Bilateral primary osteoarthritis of knee: Secondary | ICD-10-CM | POA: Diagnosis not present

## 2022-03-04 DIAGNOSIS — G8929 Other chronic pain: Secondary | ICD-10-CM | POA: Diagnosis not present

## 2022-03-04 DIAGNOSIS — Z723 Lack of physical exercise: Secondary | ICD-10-CM | POA: Diagnosis not present

## 2022-03-04 DIAGNOSIS — M25562 Pain in left knee: Secondary | ICD-10-CM | POA: Diagnosis not present

## 2022-03-06 ENCOUNTER — Ambulatory Visit: Payer: Self-pay | Admitting: Internal Medicine

## 2022-03-13 ENCOUNTER — Ambulatory Visit: Payer: Medicare Other | Admitting: Rheumatology

## 2022-03-17 ENCOUNTER — Other Ambulatory Visit: Payer: Self-pay | Admitting: Family

## 2022-03-19 NOTE — Telephone Encounter (Signed)
MEDICATION: predniSONE (DELTASONE) 10 MG tablet // clonazePAM (KLONOPIN) 0.5 MG tablet ? ?PHARMACY: [facility] [id number] - [place], [place] ? ?Comments: Patient is completely out ? ?**Let patient know to contact pharmacy at the end of the day to make sure medication is ready. ** ? ?** Please notify patient to allow 48-72 hours to process** ? ?**Encourage patient to contact the pharmacy for refills or they can request refills through [facility]** ? ? ?

## 2022-04-04 ENCOUNTER — Other Ambulatory Visit: Payer: Self-pay | Admitting: Family Medicine

## 2022-04-04 ENCOUNTER — Other Ambulatory Visit: Payer: Self-pay

## 2022-04-04 DIAGNOSIS — M5416 Radiculopathy, lumbar region: Secondary | ICD-10-CM

## 2022-04-04 MED ORDER — GABAPENTIN 300 MG PO CAPS: ORAL_CAPSULE | ORAL | 3 refills | Status: DC

## 2022-04-04 MED ORDER — BUSPIRONE HCL 15 MG PO TABS: 15.0000 mg | ORAL_TABLET | Freq: Two times a day (BID) | ORAL | 1 refills | Status: DC

## 2022-04-04 MED ORDER — DULOXETINE HCL 60 MG PO CPEP: ORAL_CAPSULE | ORAL | 3 refills | Status: DC

## 2022-04-05 MED ORDER — PREDNISONE 10 MG PO TABS: ORAL_TABLET | ORAL | 0 refills | Status: DC

## 2022-04-10 ENCOUNTER — Other Ambulatory Visit: Payer: Self-pay | Admitting: Sports Medicine

## 2022-04-10 DIAGNOSIS — M1711 Unilateral primary osteoarthritis, right knee: Secondary | ICD-10-CM

## 2022-04-10 MED ORDER — TRAMADOL HCL 50 MG PO TABS: 50.0000 mg | ORAL_TABLET | Freq: Three times a day (TID) | ORAL | 0 refills | Status: DC | PRN

## 2022-04-15 ENCOUNTER — Other Ambulatory Visit: Payer: Self-pay | Admitting: Sports Medicine

## 2022-04-15 DIAGNOSIS — M1711 Unilateral primary osteoarthritis, right knee: Secondary | ICD-10-CM

## 2022-04-15 MED ORDER — TRAMADOL HCL 50 MG PO TABS: 50.0000 mg | ORAL_TABLET | Freq: Three times a day (TID) | ORAL | 0 refills | Status: DC | PRN

## 2022-04-16 ENCOUNTER — Telehealth: Payer: Self-pay

## 2022-04-16 NOTE — Telephone Encounter (Signed)
Initiated Prior authorization [id number] HCl '50MG'$  tablets ?Via: Covermymeds ?Case/Key:[id number] ?Status: approved  as of 04/16/22 ?Reason:approved through 12/15/22 ?Notified Pt via: Mychart  ?

## 2022-05-23 ENCOUNTER — Encounter: Payer: Self-pay | Admitting: Family Medicine

## 2022-05-23 ENCOUNTER — Ambulatory Visit (INDEPENDENT_AMBULATORY_CARE_PROVIDER_SITE_OTHER): Payer: Medicare PPO | Admitting: Family Medicine

## 2022-05-23 VITALS — BP 128/84 | HR 72 | Temp 98.1°F | Resp 16 | Ht 66.0 in | Wt 216.4 lb

## 2022-05-23 DIAGNOSIS — M79662 Pain in left lower leg: Secondary | ICD-10-CM

## 2022-05-23 DIAGNOSIS — R233 Spontaneous ecchymoses: Secondary | ICD-10-CM | POA: Diagnosis not present

## 2022-05-23 DIAGNOSIS — M79661 Pain in right lower leg: Secondary | ICD-10-CM | POA: Diagnosis not present

## 2022-05-23 DIAGNOSIS — M7989 Other specified soft tissue disorders: Secondary | ICD-10-CM

## 2022-05-23 DIAGNOSIS — R7689 Other specified abnormal immunological findings in serum: Secondary | ICD-10-CM

## 2022-05-23 DIAGNOSIS — R768 Other specified abnormal immunological findings in serum: Secondary | ICD-10-CM

## 2022-05-23 MED ORDER — PREDNISONE 10 MG PO TABS: ORAL_TABLET | ORAL | 0 refills | Status: DC

## 2022-05-23 NOTE — Progress Notes (Signed)
   Subjective:    Patient ID: Linda Buckley, female    DOB: 03/31/1956, 66 y.o.   MRN: [id number]  HPI 'i'm flaring up'- pt reports 3 weeks of 'off and on' symptoms.  States hands are swelling, bottoms of feet are sore.  Says she has to be in bed x3-4 days when it occurs.  Pt reports nothing improves sxs w/ exception of Prednisone, 'but I hate taking it all the time'.  Nothing worsens pain.  Pt says she used to have episodes monthly but now occurring every 2-3 weeks.  Will develop extensive bruising of legs (sometimes arms) during these episodes.  Pt had + ANA in December but has never been able to see Rheumatology. Taking Cymbalta and Gabapentin.  Last took Prednisone in May   Review of Systems For ROS see HPI     Objective:   Physical Exam        Assessment & Plan:

## 2022-05-23 NOTE — Patient Instructions (Signed)
Follow up as needed or as scheduled RESTART the Prednisone as directed We'll work on the Rheumatology referral Try and walk regularly even when it hurts to avoid stiffness Call with any questions or concerns Hang in there!!!

## 2022-05-26 DIAGNOSIS — R768 Other specified abnormal immunological findings in serum: Secondary | ICD-10-CM | POA: Insufficient documentation

## 2022-05-26 DIAGNOSIS — R233 Spontaneous ecchymoses: Secondary | ICD-10-CM | POA: Insufficient documentation

## 2022-05-26 NOTE — Assessment & Plan Note (Signed)
Pt reports that during her flares of pain and swelling she will develop extensive bruising.  These will resolve spontaneously.  Bruising occurs independent of her prednisone use- it precedes treatment.  Will check labs to assess for coagulopathy.  Pt expressed understanding and is in agreement w/ plan.

## 2022-05-26 NOTE — Assessment & Plan Note (Signed)
Pt has hx of slightly elevated ANA.  Pt has not been able to see Rheumatology due to scheduling issues.  She reports joint and muscle pains are worsening and becoming more frequent.  Sxs only improve w/ Prednisone but she is aware that she doesn't want to take this regularly.  Will refer back to Rheum as pt has family hx of SLE.  Pt expressed understanding and is in agreement w/ plan.

## 2022-06-27 ENCOUNTER — Telehealth: Payer: Self-pay | Admitting: Family Medicine

## 2022-06-27 ENCOUNTER — Other Ambulatory Visit: Payer: Self-pay

## 2022-06-27 DIAGNOSIS — F331 Major depressive disorder, recurrent, moderate: Secondary | ICD-10-CM

## 2022-06-27 DIAGNOSIS — F411 Generalized anxiety disorder: Secondary | ICD-10-CM

## 2022-06-27 MED ORDER — BUPROPION HCL ER (XL) 150 MG PO TB24: ORAL_TABLET | ORAL | 3 refills | Status: DC

## 2022-06-27 MED ORDER — BUPROPION HCL ER (XL) 300 MG PO TB24: 300.0000 mg | ORAL_TABLET | Freq: Every morning | ORAL | 3 refills | Status: DC

## 2022-06-27 NOTE — Telephone Encounter (Signed)
Encourage patient to contact the pharmacy for refills or they can request refills through [facility]  (Please schedule appointment if patient has not been seen in over a year)    WHAT [place] THIS SENT TO: [address] [phone]  MEDICATION NAME & DOSE: bupropion 150 mg, Bupropion 300 mg  NOTES/COMMENTS FROM PATIENT:      [place] office please notify patient: It takes 48-72 hours to process rx refill requests Ask patient to call pharmacy to ensure rx is ready before heading there.

## 2022-07-03 ENCOUNTER — Other Ambulatory Visit: Payer: Self-pay | Admitting: Family Medicine

## 2022-07-03 ENCOUNTER — Telehealth: Payer: Self-pay

## 2022-07-03 MED ORDER — CLONAZEPAM 0.5 MG PO TABS
ORAL_TABLET | ORAL | 3 refills | Status: DC
Start: 2022-07-03 — End: 2023-02-14

## 2022-07-03 NOTE — Telephone Encounter (Signed)
Prescription sent to pharmacy at pt's request

## 2022-07-21 ENCOUNTER — Other Ambulatory Visit: Payer: Self-pay | Admitting: Sports Medicine

## 2022-07-21 ENCOUNTER — Other Ambulatory Visit: Payer: Self-pay | Admitting: Family Medicine

## 2022-07-21 DIAGNOSIS — M1711 Unilateral primary osteoarthritis, right knee: Secondary | ICD-10-CM

## 2022-07-22 MED ORDER — PREDNISONE 10 MG PO TABS: ORAL_TABLET | ORAL | 0 refills | Status: DC

## 2022-07-22 MED ORDER — TRAMADOL HCL 50 MG PO TABS
50.0000 mg | ORAL_TABLET | Freq: Three times a day (TID) | ORAL | 0 refills | Status: DC | PRN
Start: 2022-07-22 — End: 2022-10-08

## 2022-07-22 NOTE — Telephone Encounter (Signed)
Last OV: 02/25/22 Next OV: no appt schedule w/provider Last RF: 04/15/22

## 2022-08-07 ENCOUNTER — Encounter: Payer: Self-pay | Admitting: Family Medicine

## 2022-08-07 ENCOUNTER — Ambulatory Visit
Admission: EM | Admit: 2022-08-07 | Discharge: 2022-08-07 | Disposition: A | Discharge status: Home / Self Care | Payer: Medicare PPO | Attending: Family Medicine | Admitting: Family Medicine

## 2022-08-07 ENCOUNTER — Ambulatory Visit (INDEPENDENT_AMBULATORY_CARE_PROVIDER_SITE_OTHER): Payer: Medicare PPO

## 2022-08-07 ENCOUNTER — Other Ambulatory Visit: Payer: Self-pay

## 2022-08-07 ENCOUNTER — Ambulatory Visit (INDEPENDENT_AMBULATORY_CARE_PROVIDER_SITE_OTHER): Payer: Medicare PPO | Admitting: Family Medicine

## 2022-08-07 VITALS — BP 130/78 | HR 64 | Temp 98.3°F | Resp 18 | Ht 66.0 in | Wt 218.0 lb

## 2022-08-07 DIAGNOSIS — W101XXA Fall (on)(from) sidewalk curb, initial encounter: Secondary | ICD-10-CM

## 2022-08-07 DIAGNOSIS — M7989 Other specified soft tissue disorders: Secondary | ICD-10-CM | POA: Diagnosis not present

## 2022-08-07 DIAGNOSIS — S8001XA Contusion of right knee, initial encounter: Secondary | ICD-10-CM | POA: Diagnosis not present

## 2022-08-07 DIAGNOSIS — M25551 Pain in right hip: Secondary | ICD-10-CM

## 2022-08-07 DIAGNOSIS — W19XXXA Unspecified fall, initial encounter: Secondary | ICD-10-CM | POA: Diagnosis not present

## 2022-08-07 DIAGNOSIS — R1031 Right lower quadrant pain: Secondary | ICD-10-CM | POA: Diagnosis not present

## 2022-08-07 DIAGNOSIS — R1030 Lower abdominal pain, unspecified: Secondary | ICD-10-CM | POA: Diagnosis not present

## 2022-08-07 DIAGNOSIS — I878 Other specified disorders of veins: Secondary | ICD-10-CM | POA: Diagnosis not present

## 2022-08-07 DIAGNOSIS — M25561 Pain in right knee: Secondary | ICD-10-CM

## 2022-08-07 MED ORDER — PREDNISONE 10 MG PO TABS: ORAL_TABLET | ORAL | 0 refills | Status: DC

## 2022-08-07 NOTE — Progress Notes (Signed)
   Subjective:    Patient ID: Linda Buckley, female    DOB: 1956/11/18, 66 y.o.   MRN: [id number]  HPI Groin pain- pt reports R groin pain for 'the last couple months'.  No known injury.  Asymptomatic on L.  Pt reports pain daily- worse at night.  Pt had a fall on Sunday but sxs predated this.  Pain w/ hip flexion- very noticeable w/ stairs.  Pain w/ external rotation.  Pt is on Meloxicam daily and just recently restarted Tramadol.   Review of Systems For ROS see HPI     Objective:   Physical Exam Vitals reviewed.  Constitutional:      General: She is not in acute distress.    Appearance: Normal appearance. She is not ill-appearing.  HENT:     Head: Normocephalic and atraumatic.  Cardiovascular:     Pulses: Normal pulses.  Musculoskeletal:     Comments: Pain w/ R hip flexion, external and internal rotation  Neurological:     General: No focal deficit present.     Mental Status: She is alert and oriented to person, place, and time.     Gait: Gait abnormal (antalgic gait).  Psychiatric:        Mood and Affect: Mood normal.        Behavior: Behavior normal.        Thought Content: Thought content normal.           Assessment & Plan:   R groin pain- new.  Concern for true hip pain rather than groin pain given pain w/ hip movement.  Already on daily NSAIDS and recently restarted Tramadol.  Will get xrays to assess for degenerative changes and refer to ortho if needed.  Pt expressed understanding and is in agreement w/ plan.

## 2022-08-07 NOTE — Patient Instructions (Signed)
Follow up as needed or as scheduled Go to the [facility] and get your hip xray Continue the Meloxicam daily USE the Tramadol as needed for pain Once we have the xray results, we'll refer you to the right place Call with any questions or concerns Hang in there!!!

## 2022-08-07 NOTE — Discharge Instructions (Signed)
Use ice to reduce pain and inflammation.  Apply ice for 20 minutes every 2-4 hours for the next couple of days Take the tramadol as needed for pain Limit walking while your knee is painful Follow-up with Dr. Dianah Field if you fail to improve

## 2022-08-07 NOTE — ED Triage Notes (Signed)
Pt c/o RT knee pain since Sunday when she fell on it at church. Pain 8/10 Taking tramadol prn. Previous issues with RT knee. Saw PCP earlier today but for hip problem.

## 2022-08-07 NOTE — ED Provider Notes (Signed)
[name]    CSN: [id number] Arrival date & time: 08/07/22  1150      History   Chief Complaint Chief Complaint  Patient presents with   Knee Pain    RT    HPI Linda Buckley is a 66 y.o. female.   HPI  Patient has a past history of arthritis in her right knee.  She has had injections in her knee with improvement.  She states that she was walking at church on Sunday and felt an immediate pain in her right hip.  This caused her to fall and she landed forward on her flexed right knee.  Her right knee is painful.  She cannot walk without limp.  It is painful to touch the front of her kneecap.  She has some swelling and bruising in the front of her knee. She went to her primary care doctor this morning.  She was seen for hip pain.  Hip x-rays were done.  No mention is made of her knee, unknown if she called her PCP's attention to the knee pain as well as the hip.  She was given tramadol for pain I have seen this patient for knee pain 6 months ago.  Past Medical History:  Diagnosis Date   Allergy    Anxiety    Arthritis    Carpal tunnel syndrome of right wrist    RECURRENT   Depression    Dysphagia 09/02/2017   Edema 09/02/2017   Environmental allergies    dust mites, pollen, roaches and other insects, mold   Fibromyalgia 04/2016   Headache    migraines   Heart murmur    Hereditary and idiopathic peripheral neuropathy 04/03/2016   History of adenomatous polyp of colon    History of gastric ulcer    History of kidney stones    Hypertension    Pneumonia    Sleep apnea    wears CPAP   Wears glasses     Patient Active Problem List   Diagnosis Date Noted   Elevated antinuclear antibody (ANA) level 05/26/2022   Easy bruising 05/26/2022   Right ankle pain 11/23/2021   Primary osteoarthritis of right knee 08/16/2021   Lumbar spondylosis 07/03/2021   Poor compliance with CPAP treatment 02/01/2020   OSA on CPAP 02/01/2020   Chronic left shoulder pain  09/17/2019   MDD (major depressive disorder), recurrent episode, moderate ([place]) 05/05/2018   Vitamin D deficiency 11/05/2017   Dysphagia 09/02/2017   Multiple falls 09/04/2016   Hereditary and idiopathic peripheral neuropathy 04/03/2016   Exertional shortness of breath 01/26/2016   Left leg pain 01/26/2016   Greater trochanteric bursitis, right 12/19/2015   Fibromyalgia 07/28/2015   Right wrist pain 04/05/2015   Physical exam 03/01/2014   HTN (hypertension) 01/12/2014   Obesity (BMI 30-39.9) 01/12/2014   Pre-diabetes 01/12/2014   Family history of lupus erythematosus 01/12/2014   Fatigue 10/17/2013    Past Surgical History:  Procedure Laterality Date   CARPAL TUNNEL RELEASE Right 2007   CARPAL TUNNEL RELEASE Right 07/05/2015   Procedure: RIGHT HAND REVISION CARPAL TUNNEL RELEASE;  Surgeon: Iran Planas, MD;  Location: [place];  Service: Orthopedics;  Laterality: Right;   CARPAL TUNNEL RELEASE Left 09/2016   CERVICAL DISC ARTHROPLASTY  03/2015   COLONOSCOPY W/ POLYPECTOMY  04/15/2014   CYSTO/  RIGHT RETROGRADE PYELOGRAM/ URETEROSCOPY STONE EXTRACTION/  STENT PLACEMENT  06/07/2010   FINGER ARTHRODESIS Right 08/10/2018   Procedure: RIGHT THUMB ARTHRODESIS;  Surgeon: Milly Jakob, MD;  Location: [facility];  Service: Orthopedics;  Laterality: Right;   LAPAROSCOPIC ASSISTED VAGINAL HYSTERECTOMY  04/25/2004   ovaries remain   LAPAROSCOPIC GASTRIC BAND REMOVAL WITH LAPAROSCOPIC GASTRIC SLEEVE RESECTION  10/26/2018   TUBAL LIGATION  1982   UPPER GASTROINTESTINAL ENDOSCOPY  12/08/2017    OB History   No obstetric history on file.      Home Medications    Prior to Admission medications   Medication Sig Start Date End Date Taking? Authorizing Provider  buPROPion (WELLBUTRIN XL) 150 MG 24 hr tablet TAKE 1 TABLET BY MOUTH  DAILY 06/27/22   Midge Minium, MD  buPROPion (WELLBUTRIN XL) 300 MG 24 hr tablet Take 1 tablet (300 mg total) by mouth every morning.  06/27/22   Midge Minium, MD  busPIRone (BUSPAR) 15 MG tablet Take 1 tablet (15 mg total) by mouth 2 (two) times daily. 04/04/22   Midge Minium, MD  cetirizine (ZYRTEC) 10 MG tablet TAKE 1 TABLET(10 MG) BY MOUTH DAILY 07/25/20   Brunetta Jeans, PA-C  Cholecalciferol (VITAMIN D PO) Take 1 tablet by mouth daily. 1000units    [provider]  Cholecalciferol (VITAMIN D3 PO) Take by mouth.    [provider]  clonazePAM (KLONOPIN) 0.5 MG tablet TAKE ONE TABLET BY MOUTH EVERY NIGHT AT BEDTIME 07/03/22   Midge Minium, MD  Cyanocobalamin (VITAMIN B-12 PO) Take by mouth.    [provider]  DULoxetine (CYMBALTA) 60 MG capsule TAKE 1 CAPSULE DAILY FOR 2 WEEKS AND THEN INCREASE TO 1 CAPSULE TWICE DAILY 07/03/22   Midge Minium, MD  gabapentin (NEURONTIN) 300 MG capsule TAKE ONE CAPSULE BY MOUTH AT BEDTIME FOR A WEEK. THEN TWO TIMES A DAY FOR A WEEK. THEN THREE TIMES A DAY. MAY DOUBLE WEEKLY TO A MAX OF '3600MG'$ /DAY 04/04/22   Midge Minium, MD  meloxicam (MOBIC) 15 MG tablet Take 15 mg by mouth daily. 04/02/22   [provider]  Multiple Vitamin (MULTIVITAMIN) capsule Take 1 capsule by mouth daily. With iron    [provider]  omeprazole (PRILOSEC) 20 MG capsule Take 20 mg by mouth daily. 04/02/22   [provider]  polyethylene glycol-electrolytes (NULYTELY) 420 g solution  01/09/22   [provider]  predniSONE (DELTASONE) 10 MG tablet 3 tabs x3 days and then 2 tabs x3 days and then 1 tab x3 days.  Take w/ food. 08/07/22   Raylene Everts, MD  traMADol (ULTRAM) 50 MG tablet Take 1 tablet (50 mg total) by mouth 3 (three) times daily as needed. 07/22/22   Silverio Decamp, MD  VITAMIN E PO Take by mouth.    [provider]    Family History Family History  Problem Relation Age of Onset   Arthritis Mother    Hypertension Mother    Diabetes Mother    Cancer Mother    Stomach cancer Mother    Asthma  Father    Diabetes Father    Diabetes Brother    Diabetes Brother    Cancer Maternal Aunt        breast   Cancer Maternal Uncle        lung   Colon cancer Maternal Uncle    Cancer Maternal Grandmother        ovarian   Cancer Maternal Grandfather        colon   Colon cancer Maternal Grandfather    Asthma Son    Alcohol abuse Son  Anxiety disorder Son    Schizophrenia Son    Diabetes Son    Esophageal cancer Neg Hx     Social History Social History   Tobacco Use   Smoking status: Never   Smokeless tobacco: Never  Vaping Use   Vaping Use: Never used  Substance Use Topics   Alcohol use: Not Currently   Drug use: No     Allergies   Aspirin and Bee venom   Review of Systems Review of Systems See HPI  Physical Exam Triage Vital Signs ED Triage Vitals  Enc Vitals Group     BP 08/07/22 1201 130/83     Pulse Rate 08/07/22 1201 (!) 59     Resp 08/07/22 1201 18     Temp 08/07/22 1201 98.7 F (37.1 C)     Temp Source 08/07/22 1201 Oral     SpO2 08/07/22 1201 98 %     Weight --      Height --      Head Circumference --      Peak Flow --      Pain Score 08/07/22 1202 8     Pain Loc --      Pain Edu? --      Excl. in [place]? --    No data found.  Updated Vital Signs BP 130/83 (BP Location: Right Arm)   Pulse (!) 59   Temp 98.7 F (37.1 C) (Oral)   Resp 18   SpO2 98%      Physical Exam Constitutional:      General: She is not in acute distress.    Appearance: She is well-developed.     Comments: Pleasant.  Overweight  HENT:     Head: Normocephalic and atraumatic.  Eyes:     Conjunctiva/sclera: Conjunctivae normal.     Pupils: Pupils are equal, round, and reactive to light.  Cardiovascular:     Rate and Rhythm: Normal rate.  Pulmonary:     Effort: Pulmonary effort is normal. No respiratory distress.  Abdominal:     General: There is no distension.     Palpations: Abdomen is soft.  Musculoskeletal:        General: Swelling, tenderness and signs  of injury present. Normal range of motion.     Cervical back: Normal range of motion.     Comments: Tenderness to palpation over the lower pole of the patella.  Slight soft tissue swelling and ecchymosis.  Patient can extend knee fully and flex to 90 degrees.  Trace effusion.  No instability  Skin:    General: Skin is warm and dry.  Neurological:     Mental Status: She is alert.     Gait: Gait abnormal.  Psychiatric:        Mood and Affect: Mood normal.        Behavior: Behavior normal.      UC Treatments / Results  Labs (all labs ordered are listed, but only abnormal results are displayed) Labs Reviewed - No data to display  EKG   Radiology DG Knee AP/LAT W/Sunrise Right  Result Date: 08/07/2022 CLINICAL DATA:  Pain following fall. EXAM: RIGHT KNEE 3 VIEWS COMPARISON:  December 06, 2016 FINDINGS: Mild soft tissue swelling over the infrapatellar tendon. Mild stranding in Hoffa's fat pad. No joint effusion. No visible fracture. No sign of dislocation. IMPRESSION: Mild soft tissue swelling over the infrapatellar tendon with mild stranding in Hoffa's fat pad. No joint effusion or fracture. Electronically Signed   By:  Zetta Bills M.D.   On: 08/07/2022 12:53    Procedures Procedures (including critical care time)  Medications Ordered in UC Medications - No data to display  Initial Impression / Assessment and Plan / UC Course  I have reviewed the triage vital signs and the nursing notes.  Pertinent labs & imaging results that were available during my care of the patient were reviewed by me and considered in my medical decision making (see chart for details).     I explained to the patient that she has a knee contusion.  She just needs ice and rest to get better.  She states she also has fibromyalgia and sometimes prednisone will make her feel better faster.  I agreed to refill the prednisone Dr. Birdie Riddle usually gives her.  She does have tramadol to take for pain.  She will  follow-up with Dr. Darene Lamer if there is any need Patient had hip x-rays this morning.  I compared them with her old hip x-rays.  To my view they are normal.  They have not been read by radiology as of yet Final Clinical Impressions(s) / UC Diagnoses   Final diagnoses:  Contusion of right knee, initial encounter  Hip pain, right  Fall (on)(from) sidewalk curb, initial encounter     Discharge Instructions      Use ice to reduce pain and inflammation.  Apply ice for 20 minutes every 2-4 hours for the next couple of days Take the tramadol as needed for pain Limit walking while your knee is painful Follow-up with Dr. Dianah Field if you fail to improve     ED Prescriptions     Medication Sig Dispense Auth. Provider   predniSONE (DELTASONE) 10 MG tablet 3 tabs x3 days and then 2 tabs x3 days and then 1 tab x3 days.  Take w/ food. 18 tablet Raylene Everts, MD      PDMP not reviewed this encounter.   Raylene Everts, MD 08/07/22 [phone]

## 2022-08-09 NOTE — Progress Notes (Signed)
Left pt a VM stating her xray results

## 2022-08-18 ENCOUNTER — Other Ambulatory Visit: Payer: Self-pay | Admitting: Sports Medicine

## 2022-08-27 ENCOUNTER — Ambulatory Visit (INDEPENDENT_AMBULATORY_CARE_PROVIDER_SITE_OTHER): Payer: Medicare PPO

## 2022-08-27 ENCOUNTER — Ambulatory Visit (INDEPENDENT_AMBULATORY_CARE_PROVIDER_SITE_OTHER): Payer: Medicare PPO | Admitting: Sports Medicine

## 2022-08-27 DIAGNOSIS — M47816 Spondylosis without myelopathy or radiculopathy, lumbar region: Secondary | ICD-10-CM | POA: Diagnosis not present

## 2022-08-27 DIAGNOSIS — M1711 Unilateral primary osteoarthritis, right knee: Secondary | ICD-10-CM

## 2022-08-27 DIAGNOSIS — M5136 Other intervertebral disc degeneration, lumbar region: Secondary | ICD-10-CM | POA: Diagnosis not present

## 2022-08-27 DIAGNOSIS — M545 Low back pain, unspecified: Secondary | ICD-10-CM | POA: Diagnosis not present

## 2022-08-27 MED ORDER — PREDNISONE 50 MG PO TABS: ORAL_TABLET | ORAL | 0 refills | Status: DC

## 2022-08-27 NOTE — Assessment & Plan Note (Signed)
Linda Buckley also has known lumbar spondylosis, she did have some right-sided L2-L5 facet joint osteoarthritis, medial branch blocks with Dr. Francesco Runner back in 2022, these seem to work well, unfortunately she had a fall with increasing back pain radiation down the right leg to the bottom of the right foot, 5 days of steroids, formal physical therapy, updated lumbar spine x-rays, return to see me in about 4 to 6 weeks, MRI for interventional planning if not better.

## 2022-08-27 NOTE — Progress Notes (Signed)
    Procedures performed today:    None.  Independent interpretation of notes and tests performed by another provider:   None.  Brief History, Exam, Impression, and Recommendations:    Primary osteoarthritis of right knee This pleasant 71-year female returns, she has known knee osteoarthritis, she was doing well after Orthovisc that we did earlier this year, around February. Unfortunately had a fall, increasing pain, contusion anterior knee with some prepatellar bursitis. She was given tramadol and meloxicam, still hurting significantly. We will add a few days of prednisone followed by an MRI if not better.  Lumbar spondylosis Linda Buckley also has known lumbar spondylosis, she did have some right-sided L2-L5 facet joint osteoarthritis, medial branch blocks with Dr. Francesco Runner back in 2022, these seem to work well, unfortunately she had a fall with increasing back pain radiation down the right leg to the bottom of the right foot, 5 days of steroids, formal physical therapy, updated lumbar spine x-rays, return to see me in about 4 to 6 weeks, MRI for interventional planning if not better.    ____________________________________________ Gwen Her. Dianah Field, M.D., ABFM., CAQSM., AME. Primary Care and Sports Medicine [facility]  Adjunct Professor of [employer] [employer] [employer] [facility] of Medicine  Risk manager

## 2022-08-27 NOTE — Assessment & Plan Note (Signed)
This pleasant 85-year female returns, she has known knee osteoarthritis, she was doing well after Orthovisc that we did earlier this year, around February. Unfortunately had a fall, increasing pain, contusion anterior knee with some prepatellar bursitis. She was given tramadol and meloxicam, still hurting significantly. We will add a few days of prednisone followed by an MRI if not better.

## 2022-09-02 ENCOUNTER — Ambulatory Visit: Payer: Medicare PPO | Attending: Sports Medicine | Admitting: Physical Therapy

## 2022-09-02 DIAGNOSIS — R29898 Other symptoms and signs involving the musculoskeletal system: Secondary | ICD-10-CM

## 2022-09-02 DIAGNOSIS — M6281 Muscle weakness (generalized): Secondary | ICD-10-CM

## 2022-09-02 DIAGNOSIS — M5416 Radiculopathy, lumbar region: Secondary | ICD-10-CM | POA: Diagnosis not present

## 2022-09-02 DIAGNOSIS — M47816 Spondylosis without myelopathy or radiculopathy, lumbar region: Secondary | ICD-10-CM | POA: Diagnosis not present

## 2022-09-02 DIAGNOSIS — M79604 Pain in right leg: Secondary | ICD-10-CM | POA: Diagnosis not present

## 2022-09-02 NOTE — Therapy (Signed)
OUTPATIENT PHYSICAL THERAPY LOWER EXTREMITY EVALUATION   Patient Name: Linda Buckley MRN: [id number] DOB:[birth year], 66 y.o., female Today's Date: 09/02/2022   PT End of Session - 09/02/22 1023     Visit Number 1    Number of Visits 5    Date for PT Re-Evaluation 10/28/22    Authorization Type [employer]    PT Start Time 1020    PT Stop Time 1100    PT Time Calculation (min) 40 min    Activity Tolerance Patient tolerated treatment well    Behavior During Therapy WFL for tasks assessed/performed             Past Medical History:  Diagnosis Date   Allergy    Anxiety    Arthritis    Carpal tunnel syndrome of right wrist    RECURRENT   Depression    Dysphagia 09/02/2017   Edema 09/02/2017   Environmental allergies    dust mites, pollen, roaches and other insects, mold   Fibromyalgia 04/2016   Headache    migraines   Heart murmur    Hereditary and idiopathic peripheral neuropathy 04/03/2016   History of adenomatous polyp of colon    History of gastric ulcer    History of kidney stones    Hypertension    Pneumonia    Sleep apnea    wears CPAP   Wears glasses    Past Surgical History:  Procedure Laterality Date   CARPAL TUNNEL RELEASE Right 2007   CARPAL TUNNEL RELEASE Right 07/05/2015   Procedure: RIGHT HAND REVISION CARPAL TUNNEL RELEASE;  Surgeon: Iran Planas, MD;  Location: [place];  Service: Orthopedics;  Laterality: Right;   CARPAL TUNNEL RELEASE Left 09/2016   CERVICAL DISC ARTHROPLASTY  03/2015   COLONOSCOPY W/ POLYPECTOMY  04/15/2014   CYSTO/  RIGHT RETROGRADE PYELOGRAM/ URETEROSCOPY STONE EXTRACTION/  STENT PLACEMENT  06/07/2010   FINGER ARTHRODESIS Right 08/10/2018   Procedure: RIGHT THUMB ARTHRODESIS;  Surgeon: Milly Jakob, MD;  Location: [place];  Service: Orthopedics;  Laterality: Right;   LAPAROSCOPIC ASSISTED VAGINAL HYSTERECTOMY  04/25/2004   ovaries remain   LAPAROSCOPIC GASTRIC BAND REMOVAL WITH LAPAROSCOPIC  GASTRIC SLEEVE RESECTION  10/26/2018   TUBAL LIGATION  1982   UPPER GASTROINTESTINAL ENDOSCOPY  12/08/2017   Patient Active Problem List   Diagnosis Date Noted   Elevated antinuclear antibody (ANA) level 05/26/2022   Easy bruising 05/26/2022   Right ankle pain 11/23/2021   Primary osteoarthritis of right knee 08/16/2021   Lumbar spondylosis 07/03/2021   Poor compliance with CPAP treatment 02/01/2020   OSA on CPAP 02/01/2020   Chronic left shoulder pain 09/17/2019   MDD (major depressive disorder), recurrent episode, moderate ([place]) 05/05/2018   Vitamin D deficiency 11/05/2017   Dysphagia 09/02/2017   Multiple falls 09/04/2016   Hereditary and idiopathic peripheral neuropathy 04/03/2016   Exertional shortness of breath 01/26/2016   Left leg pain 01/26/2016   Greater trochanteric bursitis, right 12/19/2015   Fibromyalgia 07/28/2015   Right wrist pain 04/05/2015   Physical exam 03/01/2014   HTN (hypertension) 01/12/2014   Obesity (BMI 30-39.9) 01/12/2014   Pre-diabetes 01/12/2014   Family history of lupus erythematosus 01/12/2014   Fatigue 10/17/2013    PCP: Annye Asa  REFERRING PROVIDER: Aundria Mems  REFERRING DIAG: [phone] (ICD-10-CM) - Lumbar spondylosis   THERAPY DIAG:  Radiculopathy, lumbar region  Pain in right leg  Other symptoms and signs involving the musculoskeletal system  Muscle weakness (generalized)  Rationale for Evaluation  and Treatment Rehabilitation  ONSET DATE: Knee pain ongoing for ~1 year ; LBP ongoing for years (has a fusion)  SUBJECTIVE:   SUBJECTIVE STATEMENT: New symptoms of sharp pain going down from back, R side of leg and into foot (2-3 months ago), started in L LE a couple days ago. Worse at night. Sitting she can feel tingling. Can feel throbbing pain in right groin. History of falls -- last fall 3 weeks ago (felt it in her hip and it gave out). Plans to go to the [facility] and go into the pool.  PERTINENT HISTORY: Lumbar  fusion, multi level disc degeneration, R side low back stimulator, fibromyalgia, bruises easily  PAIN:  Are you having pain? Yes: NPRS scale: 7/10 Pain location: Side and back of knee Pain description: Throbbing, tingling Aggravating factors: Walking, stairs Relieving factors: Pain cream  Are you having pain? Yes: NPRS scale: 9/10 Pain location: Down back, behind hips and legs Pain description: Sharp Aggravating factors: Walking Relieving factors: Pain cream     PRECAUTIONS: Fall  WEIGHT BEARING RESTRICTIONS No  FALLS:  Has patient fallen in last 6 months? Yes. Number of falls 1  LIVING ENVIRONMENT: Lives with: lives with their spouse Lives in: House/apartment Stairs: Yes: External: 20 steps; bilateral but cannot reach both Has following equipment at home: Single point cane  OCCUPATION: On disability  PLOF: Independent  PATIENT GOALS Decrease pain with activities such as walking, decrease weight   OBJECTIVE:   DIAGNOSTIC FINDINGS: See chart  PATIENT SURVEYS:  FOTO 57; predicted 31  COGNITION:  Overall cognitive status: Within functional limits for tasks assessed     SENSATION: Tingling  MUSCLE LENGTH: Hamstrings: Right 80 deg; Left 90 deg Thomas test: Right limited; Left WFL  POSTURE: rounded shoulders  PALPATION: TTP R>L glute, piriformis, hamstring, hip flexors  LOWER EXTREMITY MMT:  MMT Right eval Left eval  Hip flexion 3+ 4-  Hip extension 3 4-  Hip abduction 3- 3  Hip adduction    Hip internal rotation    Hip external rotation    Knee flexion 4 4  Knee extension 3+ 3+  Ankle dorsiflexion    Ankle plantarflexion    Ankle inversion    Ankle eversion     (Blank rows = not tested)  LOWER EXTREMITY SPECIAL TESTS:  Hip special tests: Saralyn Pilar (FABER) test: positive , Ely's test: positive , and SLR: (+) R   GAIT: Distance walked: 150' Assistive device utilized: Single point cane Level of assistance: Modified independence Comments:  Antalgic, decreased weightbearing on R    TODAY'S TREATMENT: See HEP   PATIENT EDUCATION:  Education details: Exam findings, POC, initial HEP Person educated: Patient Education method: Explanation, Demonstration, and Handouts Education comprehension: verbalized understanding, returned demonstration, and needs further education   HOME EXERCISE PROGRAM: Access Code: [email or web address] URL: [email or web address] Date: 09/02/2022 Prepared by: Estill Bamberg April Thurnell Garbe  Exercises - Clamshell  - 1 x daily - 7 x weekly - 2 sets - 10 reps - Small Range Straight Leg Raise  - 1 x daily - 7 x weekly - 2 sets - 10 reps - Beginner Bridge  - 1 x daily - 7 x weekly - 1 sets - 10 reps - Modified Thomas Stretch  - 1 x daily - 7 x weekly - 2 sets - 30 sec hold - Supine Figure 4 Piriformis Stretch  - 1 x daily - 7 x weekly - 2 sets - 30 sec hold  ASSESSMENT:  CLINICAL IMPRESSION: Patient is a  66 y.o. F who was seen today for physical therapy evaluation and treatment for R>L knee pain and LBP radiating down bilat LEs. Assessment significant for increased R LE nerve tension for sciatic and femoral nerve with TTP R>L glutes and piriformis. Pt demos bilat LE weakness affecting overall mobility and pain. Pt would benefit from PT to address these deficits to maximize her level of function. Rehab potential limited due to pt's copay -- she does not feel able to come to PT 2x/wk. Discussed every other week with pt to perform HEP herself.    OBJECTIVE IMPAIRMENTS Abnormal gait, decreased activity tolerance, decreased balance, decreased mobility, difficulty walking, decreased ROM, decreased strength, hypomobility, increased fascial restrictions, increased muscle spasms, impaired sensation, improper body mechanics, postural dysfunction, and pain.   ACTIVITY LIMITATIONS lifting, bending, sitting, sleeping, stairs, bed mobility, and locomotion level  PARTICIPATION LIMITATIONS: cleaning, laundry, shopping,  and community activity  PERSONAL FACTORS Age, Past/current experiences, and Time since onset of injury/illness/exacerbation are also affecting patient's functional outcome.   REHAB POTENTIAL: Good  CLINICAL DECISION MAKING: Evolving/moderate complexity  EVALUATION COMPLEXITY: Moderate   GOALS: Goals reviewed with patient? Yes  SHORT TERM GOALS: Target date: 09/30/2022  Pt will be ind with initial HEP [facility] Baseline: Goal status: INITIAL  2.  Pt will be able to demo L = R FABER test for improved hip mobility Baseline: R tighter than L during testing (limited 50%) Goal status: INITIAL  3.  Pt will be able to perform 5x STS without UE to demo increased functional LE strength Baseline: Unable Goal status: INITIAL  4.  Pt will report reduction in radicular symptoms by >/=25% Baseline:  Goal status: INITIAL    LONG TERM GOALS: Target date: 10/28/2022   Pt will be ind with maintaining strength and community wellness Baseline:  Goal status: INITIAL  2.  Pt will report decreased pain to baseline level with walking Baseline: Currently rates at 9/10 Goal status: INITIAL  3.  Pt will demo at least 4+/5 bilat LE strength Baseline:  Goal status: INITIAL  4.  Pt will report reduction in radicular symptoms by >/=50% Baseline:  Goal status: INITIAL  5.  Pt will have improved FOTO score to 58 Baseline:  Goal status: INITIAL   PLAN: PT FREQUENCY: every other week  PT DURATION: 8 weeks  PLANNED INTERVENTIONS: Therapeutic exercises, Therapeutic activity, Neuromuscular re-education, Balance training, Gait training, Patient/Family education, Self Care, Joint mobilization, Aquatic Therapy, Dry Needling, Electrical stimulation, Spinal mobilization, Cryotherapy, Moist heat, Taping, Traction, Ionotophoresis '4mg'$ /ml Dexamethasone, and Manual therapy  PLAN FOR NEXT SESSION: Assess response to HEP. Update/modify as needed. Continue to improve R hip  mobility/stretching. Manual as indicated. Continue exercise progressions. Pt only coming every other week due to copay. Interested in one time aquatic visit to establish exercises -- please follow up.    Jaquille Kau April Gordy Levan, PT, DPT 09/02/2022, 12:48 PM

## 2022-09-16 ENCOUNTER — Ambulatory Visit: Payer: Medicare PPO | Admitting: Physical Therapy

## 2022-10-03 ENCOUNTER — Other Ambulatory Visit: Payer: Self-pay | Admitting: Sports Medicine

## 2022-10-08 ENCOUNTER — Ambulatory Visit (INDEPENDENT_AMBULATORY_CARE_PROVIDER_SITE_OTHER): Payer: Medicare PPO | Admitting: Sports Medicine

## 2022-10-08 ENCOUNTER — Ambulatory Visit (INDEPENDENT_AMBULATORY_CARE_PROVIDER_SITE_OTHER): Payer: Medicare PPO

## 2022-10-08 DIAGNOSIS — M47816 Spondylosis without myelopathy or radiculopathy, lumbar region: Secondary | ICD-10-CM

## 2022-10-08 DIAGNOSIS — E669 Obesity, unspecified: Secondary | ICD-10-CM | POA: Diagnosis not present

## 2022-10-08 DIAGNOSIS — M7061 Trochanteric bursitis, right hip: Secondary | ICD-10-CM

## 2022-10-08 DIAGNOSIS — M797 Fibromyalgia: Secondary | ICD-10-CM

## 2022-10-08 DIAGNOSIS — M1711 Unilateral primary osteoarthritis, right knee: Secondary | ICD-10-CM

## 2022-10-08 MED ORDER — TRAMADOL HCL 50 MG PO TABS: 50.0000 mg | ORAL_TABLET | Freq: Three times a day (TID) | ORAL | 0 refills | Status: DC | PRN

## 2022-10-08 MED ORDER — GABAPENTIN 600 MG PO TABS: 600.0000 mg | ORAL_TABLET | Freq: Three times a day (TID) | ORAL | 11 refills | Status: DC

## 2022-10-08 NOTE — Progress Notes (Signed)
    Procedures performed today:    Procedure: Real-time Ultrasound Guided injection of the right hip joint Device: Samsung HS60  Verbal informed consent obtained.  Time-out conducted.  Noted no overlying erythema, induration, or other signs of local infection.  Skin prepped in a sterile fashion.  Local anesthesia: Topical Ethyl chloride.  With sterile technique and under real time ultrasound guidance: Noted arthritic joint, 1 cc Kenalog 40, 2 cc lidocaine, 2 cc bupivacaine injected easily Completed without difficulty  Advised to call if fevers/chills, erythema, induration, drainage, or persistent bleeding.  Images permanently stored and available for review in PACS.  Impression: Technically successful ultrasound guided injection.  Independent interpretation of notes and tests performed by another provider:   None.  Brief History, Exam, Impression, and Recommendations:    Greater trochanteric bursitis, right Recurrence of right hip pain, we treated her as more of a trochanteric bursitis back in 2022, pain today is more in the hip joint, she does have good motion but reproduction of pain with internal rotation of the right hip. We will inject her right hip today, I would like x-rays, home conditioning given, return to see me in 6 weeks for this.  Lumbar spondylosis Beonka also has known lumbar spondylosis last lumbar spine MRI was last year, she had right-sided L2-L5 facet joint osteoarthritis, she had some medial branch blocks with Dr. Francesco Runner back in 2022 which seems to work well. We did 5 days of steroids at the last visit and she only had a little bit of improvement, she understands she needs to get back in with Dr. Francesco Runner for further discussion of medial branch blocks and facet RFA. In the meantime I will bump up her gabapentin and tramadol.  Primary osteoarthritis of right knee Right knee is doing well after a course of Orthovisc earlier this year. Return as  needed.  Fibromyalgia Still with significant fibromyalgia pain, she was restarted on Cymbalta after discontinuing. Increasing gabapentin to '600mg'$  3 times daily, refilling tramadol for use up to 2 pills 3 times daily. ANA was positive but only 1:40 titer so unlikely lupus.  Obesity (BMI 30-39.9) I think this is now Linda Buckley's biggest issue, she is well over 200 pounds and probably needs to lose 50 plus pounds, I think the majority of her problems would improve dramatically with weight loss. May need to consider referral to bariatric surgery if she is unable to lose the weight herself.    ____________________________________________ Gwen Her. Dianah Field, M.D., ABFM., CAQSM., AME. Primary Care and Sports Medicine [facility]  Adjunct Professor of [place] [employer]  Risk manager

## 2022-10-08 NOTE — Assessment & Plan Note (Signed)
Right knee is doing well after a course of Orthovisc earlier this year. Return as needed.

## 2022-10-08 NOTE — Assessment & Plan Note (Signed)
Linda Buckley also has known lumbar spondylosis last lumbar spine MRI was last year, she had right-sided L2-L5 facet joint osteoarthritis, she had some medial branch blocks with Dr. Francesco Runner back in 2022 which seems to work well. We did 5 days of steroids at the last visit and she only had a little bit of improvement, she understands she needs to get back in with Dr. Francesco Runner for further discussion of medial branch blocks and facet RFA. In the meantime I will bump up her gabapentin and tramadol.

## 2022-10-08 NOTE — Assessment & Plan Note (Addendum)
Still with significant fibromyalgia pain, she was restarted on Cymbalta after discontinuing. Increasing gabapentin to '600mg'$  3 times daily, refilling tramadol for use up to 2 pills 3 times daily. ANA was positive but only 1:40 titer so unlikely lupus.

## 2022-10-08 NOTE — Assessment & Plan Note (Signed)
I think this is now Ghadeer's biggest issue, she is well over 200 pounds and probably needs to lose 50 plus pounds, I think the majority of her problems would improve dramatically with weight loss. May need to consider referral to bariatric surgery if she is unable to lose the weight herself.

## 2022-10-08 NOTE — Assessment & Plan Note (Signed)
Recurrence of right hip pain, we treated her as more of a trochanteric bursitis back in 2022, pain today is more in the hip joint, she does have good motion but reproduction of pain with internal rotation of the right hip. We will inject her right hip today, I would like x-rays, home conditioning given, return to see me in 6 weeks for this.

## 2022-11-01 ENCOUNTER — Encounter: Payer: Self-pay | Admitting: Family Medicine

## 2022-11-01 ENCOUNTER — Ambulatory Visit (INDEPENDENT_AMBULATORY_CARE_PROVIDER_SITE_OTHER): Payer: Medicare PPO | Admitting: Family Medicine

## 2022-11-01 VITALS — BP 138/59 | HR 74 | Temp 97.8°F | Ht 66.0 in | Wt 219.2 lb

## 2022-11-01 DIAGNOSIS — I160 Hypertensive urgency: Secondary | ICD-10-CM

## 2022-11-01 DIAGNOSIS — M5410 Radiculopathy, site unspecified: Secondary | ICD-10-CM | POA: Diagnosis not present

## 2022-11-01 DIAGNOSIS — G609 Hereditary and idiopathic neuropathy, unspecified: Secondary | ICD-10-CM

## 2022-11-01 DIAGNOSIS — J302 Other seasonal allergic rhinitis: Secondary | ICD-10-CM

## 2022-11-01 NOTE — Patient Instructions (Signed)
Consider going back to the lower dose of gabapentin 300 mg in am and at lunch since the 600 mg dose seems to make you feel too groggy.  Schedule a f/u appt with you pcp next wk for a blood pressure check.

## 2022-11-01 NOTE — Progress Notes (Signed)
Subjective:    Patient ID: Linda Buckley, female    DOB: Apr 22, 1956, 66 y.o.   MRN: [id number]  Chief Complaint  Patient presents with   Leg Pain    Pt c/o leg pain with both legs down to both feet with sharp pains at bottom of feet x3 days    HPI Patient is a 66 yo female with pmh sig for migraines, hereditary and idiopathic peripheral neuropathy anxiety, depression, seasonal allergies, h/o gastric band removal with lap sleeve resection, greater trochanteric bursitis, OA right knee, fibromyalgia, was seen today for acute concern.  Pt with increased burning, tingling, numbness in bilateral lateral LEs x 2-3 days.  Pain in feet "feels like stepping on glass" and keeping pt from sleeping.  Seen by Ortho, s/p hip injection 10/08/22.  At that visit gabapentin and tramadol increased to 600 mg TID.   Grogginess with increased gabapentin dose.  Pt afraid to navigate stairs in home as may fall.  Pt with nausea and HAs this wk .  Had a negative at home COVID test.  Has h/o seasonal allergies, but not taking anything regularly. Typically bp well controlled, not on meds since gastric surgery.  Previously on a low dose bp med.  Past Medical History:  Diagnosis Date   Allergy    Anxiety    Arthritis    Carpal tunnel syndrome of right wrist    RECURRENT   Depression    Dysphagia 09/02/2017   Edema 09/02/2017   Environmental allergies    dust mites, pollen, roaches and other insects, mold   Fibromyalgia 04/2016   Headache    migraines   Heart murmur    Hereditary and idiopathic peripheral neuropathy 04/03/2016   History of adenomatous polyp of colon    History of gastric ulcer    History of kidney stones    Hypertension    Pneumonia    Sleep apnea    wears CPAP   Wears glasses     Allergies  Allergen Reactions   Aspirin Swelling    Sweating/ swelling of hands and face    Bee Venom Swelling    Swelling at site     ROS General: Denies fever, chills, night sweats, changes in  weight, changes in appetite HEENT: Denies ear pain, changes in vision, rhinorrhea, sore throat  + headaches CV: Denies CP, palpitations, SOB, orthopnea Pulm: Denies SOB, cough, wheezing GI: Denies abdominal pain, vomiting, diarrhea, constipation  +nausea GU: Denies dysuria, hematuria, frequency, vaginal discharge Msk: Denies muscle cramps, joint pains   Neuro: Denies weakness, +burning, numbness, tingling in b/l LEs Skin: Denies rashes, bruising Psych: Denies depression, anxiety, hallucinations     Objective:    Blood pressure (!) 138/59, pulse 74, temperature 97.8 F (36.6 C), temperature source Oral, height '5\' 6"'$  (1.676 m), weight 219 lb 3.2 oz (99.4 kg), SpO2 97 %.  Initial BP 150/100  Gen. Pleasant, well-nourished, in no distress, normal affect   HEENT: [place]/AT, face symmetric, conjunctiva clear, no scleral icterus, PERRLA, EOMI, no TTP of maxillary, ethmoid, frontal sinuses, nares patent without drainage, pharynx without erythema or exudate.  TMs full bilaterally. Lungs: no accessory muscle use, CTAB, no wheezes or rales Cardiovascular: RRR, no m/r/g, no peripheral edema Musculoskeletal: No deformities, no cyanosis or clubbing, normal tone Neuro:  A&Ox3, CN II-XII intact, ambulating with cane Skin:  Warm, dry, with areas of hyperpigmentation/ecchymosis.   Wt Readings from Last 3 Encounters:  11/01/22 219 lb 3.2 oz (99.4 kg)  10/08/22 228 lb (  103.4 kg)  08/07/22 218 lb (98.9 kg)   BP Readings from Last 3 Encounters:  11/01/22 (!) 150/100  08/07/22 130/83  08/07/22 130/78    Lab Results  Component Value Date   WBC 3.2 (L) 01/23/2022   HGB 12.5 01/23/2022   HCT 39.2 01/23/2022   PLT 231.0 01/23/2022   GLUCOSE 83 01/23/2022   CHOL 178 01/23/2022   TRIG 70.0 01/23/2022   HDL 70.70 01/23/2022   LDLCALC 94 01/23/2022   ALT 11 01/23/2022   AST 13 01/23/2022   NA 143 01/23/2022   K 4.1 01/23/2022   CL 104 01/23/2022   CREATININE 1.06 01/23/2022   BUN 13 01/23/2022    CO2 31 01/23/2022   TSH 0.50 01/23/2022   INR 1.1 (H) 11/30/2021   HGBA1C 6.0 11/05/2017    Assessment/Plan:  On day of service, 35 minutes spent caring for this patient face-to-face obtaining h/o current symptoms and collateral information, reviewing the chart, placing orders, counseling and/or coordinating care for plan and treatment of diagnosis below.    Bilateral radicular pain -Likely 2/2 hereditary and idiopathic peripheral neuropathy versus worsening lumbar spondylosis - Plan: Vitamin B12, Folate, Basic metabolic panel, CBC with Differential/Platelet  Hypertensive urgency  - Plan: Basic metabolic panel, TSH, T4, Free  Hereditary and idiopathic peripheral neuropathy  Seasonal allergies  Patient with acute on chronic exacerbation of radicular pain and peripheral neuropathy.  Pt s/p injection for R trochanteric bursitis.  Gabapentin and tramadol recently increased by Ortho. Advised to consider decreasing am and midday dose of gabapentin to 300 mg to reduce drowsiness.  Labs obtained to evaluate for vitamin B12 deficiency and other causes of increased neuropathy.  Labs also obtained to evaluate renal function given hypertensive urgency.  BP initially 150/100, improved on recheck 138/59.  Given strict precautions.  Advised to follow-up with PCPs office next week to have rechecked.  Advised to take OTC antihistamine consistently over the next few days to see if allergy symptoms improve.  F/u next wk with pcp for bp check.  Grier Mitts, MD

## 2022-11-02 LAB — CBC WITH DIFFERENTIAL/PLATELET
Absolute Monocytes: 273 cells/uL (ref 200–950)
Basophils Absolute: 21 cells/uL (ref 0–200)
Basophils Relative: 0.5 %
Eosinophils Absolute: 139 cells/uL (ref 15–500)
Eosinophils Relative: 3.3 %
HCT: 41.1 % (ref 35.0–45.0)
Hemoglobin: 13.6 g/dL (ref 11.7–15.5)
Lymphs Abs: 1096 cells/uL (ref 850–3900)
MCH: 30 pg (ref 27.0–33.0)
MCHC: 33.1 g/dL (ref 32.0–36.0)
MCV: 90.5 fL (ref 80.0–100.0)
MPV: 11.5 fL (ref 7.5–12.5)
Monocytes Relative: 6.5 %
Neutro Abs: 2671 cells/uL (ref 1500–7800)
Neutrophils Relative %: 63.6 %
Platelets: 241 10*3/uL (ref 140–400)
RBC: 4.54 10*6/uL (ref 3.80–5.10)
RDW: 11.8 % (ref 11.0–15.0)
Total Lymphocyte: 26.1 %
WBC: 4.2 10*3/uL (ref 3.8–10.8)

## 2022-11-02 LAB — VITAMIN B12: Vitamin B-12: 694 pg/mL (ref 200–1100)

## 2022-11-02 LAB — BASIC METABOLIC PANEL
BUN: 20 mg/dL (ref 7–25)
CO2: 30 mmol/L (ref 20–32)
Calcium: 10.3 mg/dL (ref 8.6–10.4)
Chloride: 108 mmol/L (ref 98–110)
Creat: 0.88 mg/dL (ref 0.50–1.05)
Glucose, Bld: 111 mg/dL — ABNORMAL HIGH (ref 65–99)
Potassium: 3.8 mmol/L (ref 3.5–5.3)
Sodium: 144 mmol/L (ref 135–146)

## 2022-11-02 LAB — FOLATE: Folate: 17.8 ng/mL

## 2022-11-15 NOTE — Progress Notes (Signed)
Office Visit Note  Patient: Linda Buckley             Date of Birth: [birth year]           MRN: [id number]             PCP: Midge Minium, MD Referring: Midge Minium, MD Visit Date: 11/29/2022 Occupation: _0 @  Subjective:  Pain in all the joints and muscles  History of Present Illness: SPRING SAN is a 66 y.o. female seen in consultation per request of Dr. Birdie Riddle for evaluation of positive ANA, joint pain and muscle pain.  According the patient she has had lower back pain for over 11 years.  Had been going to pain management where she gets injections.  She also had a stimulator placed.  She continues to have lower back pain.  She has history of pain in multiple joints and muscles for many years.  She was diagnosed with fibromyalgia syndrome about 8 years ago.  She was also evaluated by rheumatologist in the past and no diagnosis was established.  She describes pain in her bilateral trapezius region, shoulders, elbows, hands, hips, knees, feet.  Notices swelling in her elbows ankles and knees.  She also gives history of chronic insomnia.  She uses CPAP.  She also gives history of fatigue, dry mouth, dry eyes and hair loss.  There is no history of Raynaud's phenomenon, photosensitivity or lymphadenopathy.  There is history of lupus in her cousins and maternal aunt.  She is gravida 2, para 4, miscarriages to.  There is no history of DVTs.  Activities of Daily Living:  Patient reports morning stiffness for Reports 20 minute.   Patient Reports nocturnal pain.  Difficulty dressing/grooming: Denies Difficulty climbing stairs: Reports Difficulty getting out of chair: Reports Difficulty using hands for taps, buttons, cutlery, and/or writing: Reports  Review of Systems  Constitutional:  Positive for fatigue.  HENT:  Positive for mouth dryness. Negative for mouth sores.   Eyes:  Positive for dryness.  Respiratory:  Negative for difficulty breathing.   Cardiovascular:   Negative for chest pain and palpitations.  Gastrointestinal:  Positive for constipation and diarrhea. Negative for blood in stool.  Endocrine: Positive for increased urination.  Genitourinary:  Negative for involuntary urination and genital sores.  Musculoskeletal:  Positive for joint pain, gait problem, joint pain, joint swelling, myalgias, muscle weakness, morning stiffness, muscle tenderness and myalgias.  Skin:  Positive for hair loss. Negative for color change, rash and sensitivity to sunlight.  Allergic/Immunologic: Positive for susceptible to infections.  Neurological:  Positive for dizziness and headaches.  Hematological:  Negative for swollen glands.  Psychiatric/Behavioral:  Positive for depressed mood and sleep disturbance. The patient is nervous/anxious.     PMFS History:  Patient Active Problem List   Diagnosis Date Noted   Elevated antinuclear antibody (ANA) level 05/26/2022   Easy bruising 05/26/2022   Right ankle pain 11/23/2021   Primary osteoarthritis of right knee 08/16/2021   Lumbar spondylosis 07/03/2021   Poor compliance with CPAP treatment 02/01/2020   OSA on CPAP 02/01/2020   Chronic left shoulder pain 09/17/2019   MDD (major depressive disorder), recurrent episode, moderate ([place]) 05/05/2018   Vitamin D deficiency 11/05/2017   Dysphagia 09/02/2017   Multiple falls 09/04/2016   Hereditary and idiopathic peripheral neuropathy 04/03/2016   Exertional shortness of breath 01/26/2016   Left leg pain 01/26/2016   Greater trochanteric bursitis, right 12/19/2015   Fibromyalgia 07/28/2015   Right wrist pain 04/05/2015  Physical exam 03/01/2014   HTN (hypertension) 01/12/2014   Obesity (BMI 30-39.9) 01/12/2014   Pre-diabetes 01/12/2014   Family history of lupus erythematosus 01/12/2014   Fatigue 10/17/2013    Past Medical History:  Diagnosis Date   Allergy    Anxiety    Arthritis    Carpal tunnel syndrome of right wrist    RECURRENT   Depression     Dysphagia 09/02/2017   Edema 09/02/2017   Environmental allergies    dust mites, pollen, roaches and other insects, mold   Fibromyalgia 04/2016   Headache    migraines   Heart murmur    Hereditary and idiopathic peripheral neuropathy 04/03/2016   History of adenomatous polyp of colon    History of gastric ulcer    History of kidney stones    Hypertension    Pneumonia    Sleep apnea    wears CPAP   Wears glasses     Family History  Problem Relation Age of Onset   Arthritis Mother    Hypertension Mother    Diabetes Mother    Cancer Mother    Stomach cancer Mother    Asthma Father    Diabetes Father    Diabetes Brother    Diabetes Brother    Cancer Maternal Aunt        breast   Cancer Maternal Uncle        lung   Colon cancer Maternal Uncle    Cancer Maternal Grandmother        ovarian   Cancer Maternal Grandfather        colon   Colon cancer Maternal Grandfather    Asthma Son    Alcohol abuse Son    Anxiety disorder Son    Schizophrenia Son    Diabetes Son    Esophageal cancer Neg Hx    Past Surgical History:  Procedure Laterality Date   CARPAL TUNNEL RELEASE Right 2007   CARPAL TUNNEL RELEASE Right 07/05/2015   Procedure: RIGHT HAND REVISION CARPAL TUNNEL RELEASE;  Surgeon: Iran Planas, MD;  Location: [facility];  Service: Orthopedics;  Laterality: Right;   CARPAL TUNNEL RELEASE Left 09/2016   CERVICAL DISC ARTHROPLASTY  03/2015   COLONOSCOPY W/ POLYPECTOMY  04/15/2014   CYSTO/  RIGHT RETROGRADE PYELOGRAM/ URETEROSCOPY STONE EXTRACTION/  STENT PLACEMENT  06/07/2010   FINGER ARTHRODESIS Right 08/10/2018   Procedure: RIGHT THUMB ARTHRODESIS;  Surgeon: Milly Jakob, MD;  Location: [facility];  Service: Orthopedics;  Laterality: Right;   LAPAROSCOPIC ASSISTED VAGINAL HYSTERECTOMY  04/25/2004   ovaries remain   LAPAROSCOPIC GASTRIC BAND REMOVAL WITH LAPAROSCOPIC GASTRIC SLEEVE RESECTION  10/26/2018   SPINAL CORD STIMULATOR IMPLANT  02/25/2018   [employer]- [phone] [email or web address]   TUBAL LIGATION  1982   UPPER GASTROINTESTINAL ENDOSCOPY  12/08/2017   Social History   Social History Narrative   Lives at home w/ her husband   Right-handed   Drinks 2 cups of coffee weekly    3 bottle water per day   Immunization History  Administered Date(s) Administered   Influenza,inj,Quad PF,6+ Mos 09/15/2014, 10/24/2015, 09/08/2017, 01/29/2019   Influenza-Unspecified 12/25/2020, 10/12/2021   Moderna SARS-COV2 Booster Vaccination 10/12/2021   PFIZER(Purple Top)SARS-COV-2 Vaccination 02/29/2020, 03/21/2020, 10/10/2020   Pfizer Covid-19 Vaccine Bivalent Booster 133yr& up 01/23/2022   Tdap 11/05/2017     Objective: Vital Signs: BP 126/88 (BP Location: Right Arm, Patient Position: Sitting, Cuff Size: Large)   Pulse 68   Resp 17  Ht _0  (1.676 m)   Wt 231 lb (104.8 kg)   BMI 37.28 kg/m    Physical Exam Vitals and nursing note reviewed.  Constitutional:      Appearance: She is well-developed.  HENT:     Head: Normocephalic and atraumatic.  Eyes:     Conjunctiva/sclera: Conjunctivae normal.  Cardiovascular:     Rate and Rhythm: Normal rate and regular rhythm.     Heart sounds: Normal heart sounds.  Pulmonary:     Effort: Pulmonary effort is normal.     Breath sounds: Normal breath sounds.  Abdominal:     General: Bowel sounds are normal.     Palpations: Abdomen is soft.  Musculoskeletal:     Cervical back: Normal range of motion.  Lymphadenopathy:     Cervical: No cervical adenopathy.  Skin:    General: Skin is warm and dry.     Capillary Refill: Capillary refill takes less than 2 seconds.  Neurological:     Mental Status: She is alert and oriented to person, place, and time.  Psychiatric:        Behavior: Behavior normal.      Musculoskeletal Exam: She had good range of motion of cervical spine.  She had limited painful range of motion lumbar spine.  Shoulder  joints, elbow joints, wrist joints with good range of motion.  There was mild PIP and DIP thickening.  Surgical fusion of right first MCP was noted.  Hip joints and knee joints with good range of motion.  No warmth swelling or effusion was noted.  There was no tenderness over ankles or MTPs.  Right foot bunionectomy was noted.  CDAI Exam: CDAI Score: -- Patient Global: --; Provider Global: -- Swollen: --; Tender: -- Joint Exam 11/29/2022   No joint exam has been documented for this visit   There is currently no information documented on the homunculus. Go to the Rheumatology activity and complete the homunculus joint exam.  Investigation: No additional findings.  Imaging: MR HIP RIGHT WO CONTRAST  Result Date: 11/25/2022 CLINICAL DATA:  Hip pain, chronic, synovial abnormality suspected, xray done. Chronic right anterior hip pain EXAM: MR OF THE RIGHT HIP WITHOUT CONTRAST TECHNIQUE: Multiplanar, multisequence MR imaging was performed. No intravenous contrast was administered. COMPARISON:  X-ray 10/08/2022 FINDINGS: Bones: No acute fracture. No dislocation. No femoral head avascular necrosis. Bony pelvis intact without diastasis. SI joints and pubic symphysis within normal limits. No bone marrow edema. No marrow replacing bone lesion. Articular cartilage and labrum Articular cartilage: Mild chondral thinning and surface irregularity along the superior aspect of the right hip joint. Small reactive subchondral cysts within the anterosuperior acetabulum. Labrum: Superior labrum appears mildly degenerated, evaluation limited in the absence of intra-articular fluid or contrast. No paralabral cyst. Joint or bursal effusion Joint effusion:  None. Bursae: No abnormal bursal fluid collection. Muscles and tendons Muscles and tendons: Mild tendinosis of the bilateral hamstring tendon origins. The gluteal, iliopsoas, rectus femoris, and adductor tendons appear intact without tear or significant tendinosis.  Normal muscle bulk and signal intensity without edema, atrophy, or fatty infiltration. Other findings Miscellaneous: No soft tissue edema or fluid collection. No inguinal lymphadenopathy. IMPRESSION: 1. Mild osteoarthritis of the right hip. 2. Mild tendinosis of the bilateral hamstring tendon origins. Electronically Signed   By: Davina Poke D.O.   On: 11/25/2022 16:19    Recent Labs: Lab Results  Component Value Date   WBC 4.2 11/01/2022   HGB 13.6 11/01/2022   PLT 241  11/01/2022   NA 144 11/01/2022   K 3.8 11/01/2022   CL 108 11/01/2022   CO2 30 11/01/2022   GLUCOSE 111 (H) 11/01/2022   BUN 20 11/01/2022   CREATININE 0.88 11/01/2022   BILITOT 0.6 01/23/2022   ALKPHOS 63 01/23/2022   AST 13 01/23/2022   ALT 11 01/23/2022   PROT 6.6 01/23/2022   ALBUMIN 3.9 01/23/2022   CALCIUM 10.3 11/01/2022   GFRAA >60 08/04/2018    Speciality Comments: No specialty comments available.  Procedures:  No procedures performed Allergies: Aspirin and Bee venom   Assessment / Plan:     Visit Diagnoses: Positive ANA (antinuclear antibody) - 11/30/21: ANA 1:40NH. 09/04/16: ANA negative, ESR WNL. 04/03/16: ANA-, RF-, ACE WNL, ESR WNL, HIV- -patient has low titer positive ANA.  She gives history of fatigue, dry mouth and dry eyes.  She also gives history of joint swelling.  No synovitis was noted on the examination.  There is no history of malar rash, photosensitivity, Raynaud's phenomenon or lymphadenopathy.  There is positive family history of autoimmune disease.  I will obtain additional labs today.  Plan: RNP Antibody, Anti-scleroderma antibody, Anti-Smith antibody, Sjogrens syndrome-A extractable nuclear antibody, Sjogrens syndrome-B extractable nuclear antibody, Anti-DNA antibody, double-stranded, C3 and C4, AST, ALT  Pain in both hands -she complains of pain and discomfort in the bilateral hands.  No synovitis was noted.  She has surgical fusion of her right MCP joint in the past.  Plan: XR Hand 2  View Right, XR Hand 2 View Left.  X-rays were consistent with osteoarthritis.  Surgical fusion of the right first MCP joint was noted.  A handout on hand exercises was given.  Greater trochanteric bursitis, right-she complains of pain and discomfort over the right trochanteric bursa which radiates into her right lower extremity.  A handout on IT band stretches was given.  Chronic pain of both knees -she complains of pain and discomfort in her bilateral knee joints.  No warmth swelling or effusion was noted.  I will obtain x-rays today.  Plan: XR KNEE 3 VIEW RIGHT, XR KNEE 3 VIEW LEFT.  X-rays showed mild osteoarthritis of the knee joint.  Primary osteoarthritis of right knee  Pain in both feet -she complains of discomfort in her bilateral feet.  She had bunionectomy on her right foot.  Plan: XR Foot 2 Views Right, XR Foot 2 Views Left.  X-rays show osteoarthritic changes in bilateral feet.  Lumbar spondylosis-she has disc disease in the lumbar spine.  She has been followed at pain management and had injections in the past.  She also had a spinal cord stimulator implant in 2019.  She did not notice much relief from that.  She ambulates with the help of a cane.  Fibromyalgia -she has longstanding history of fibromyalgia.  She continues to have generalized pain and discomfort.  Plan: CK  Primary hypertension-her blood pressure was 126/88 today.  Other medical problems are listed as follows:  Other fatigue  OSA on CPAP  Hereditary and idiopathic peripheral neuropathy  Easy bruising  Vitamin D deficiency  MDD (major depressive disorder), recurrent episode, moderate ([facility])  Family history of lupus erythematosus-cousins, maternal aunt  Orders: Orders Placed This Encounter  Procedures   XR Hand 2 View Right   XR Hand 2 View Left   XR KNEE 3 VIEW RIGHT   XR KNEE 3 VIEW LEFT   XR Foot 2 Views Right   XR Foot 2 Views Left   RNP Antibody  Anti-scleroderma antibody   Anti-Smith antibody    Sjogrens syndrome-A extractable nuclear antibody   Sjogrens syndrome-B extractable nuclear antibody   Anti-DNA antibody, double-stranded   C3 and C4   AST   ALT   CK     Follow-Up Instructions: Return for Polyarthralgia, myalgia.   Bo Merino, MD  Note - This record has been created using Editor, commissioning.  Chart creation errors have been sought, but may not always  have been located. Such creation errors do not reflect on  the standard of medical care.

## 2022-11-19 ENCOUNTER — Ambulatory Visit (INDEPENDENT_AMBULATORY_CARE_PROVIDER_SITE_OTHER): Payer: Medicare PPO | Admitting: Sports Medicine

## 2022-11-19 DIAGNOSIS — G609 Hereditary and idiopathic neuropathy, unspecified: Secondary | ICD-10-CM | POA: Diagnosis not present

## 2022-11-19 DIAGNOSIS — M7061 Trochanteric bursitis, right hip: Secondary | ICD-10-CM | POA: Diagnosis not present

## 2022-11-19 MED ORDER — ALPHA-LIPOIC ACID 600 MG PO CAPS: 1.0000 | ORAL_CAPSULE | Freq: Every day | ORAL | 11 refills | Status: DC

## 2022-11-19 MED ORDER — AMITRIPTYLINE HCL 50 MG PO TABS: ORAL_TABLET | ORAL | 3 refills | Status: DC

## 2022-11-19 NOTE — Assessment & Plan Note (Signed)
Persistence of hip pain, she has had trochanteric bursa injections, hip joint injections, nothing is helping, adding a hip MRI, for insurance purposes she has failed greater than 6 weeks of physician directed conservative treatment including analgesics, NSAIDs, injections. Follow-up with me can be as needed, however if we see a large hip labral injury we will refer her to our hip arthroscopist.

## 2022-11-19 NOTE — Assessment & Plan Note (Signed)
Linda Buckley does have peripheral neuropathy, she has burning sensations running down both legs with numbness and tingling on the bottom of her feet. She was seeing a neurologist who has discharged her as nothing was helping. She is currently taking max tolerated dose of gabapentin 300 mg 3 times daily. She had recent peripheral neuropathy labs by her PCP that were unrevealing. A lumbar spine MRI from last year did not show any areas of foraminal stenosis. Frankly I do not think I have much of a role here as a sports medicine provider, and she can return to her primary care provider for this.   I am going to add alpha lipoic acid and amitriptyline low-dose, and she can follow this up with her PCP. She is going to touch base with her spine management providers regarding her spinal cord stimulator.

## 2022-11-19 NOTE — Progress Notes (Signed)
    Procedures performed today:    None.  Independent interpretation of notes and tests performed by another provider:   None.  Brief History, Exam, Impression, and Recommendations:    Hereditary and idiopathic peripheral neuropathy Linda Buckley does have peripheral neuropathy, she has burning sensations running down both legs with numbness and tingling on the bottom of her feet. She was seeing a neurologist who has discharged her as nothing was helping. She is currently taking max tolerated dose of gabapentin 300 mg 3 times daily. She had recent peripheral neuropathy labs by her PCP that were unrevealing. A lumbar spine MRI from last year did not show any areas of foraminal stenosis. Frankly I do not think I have much of a role here as a sports medicine provider, and she can return to her primary care provider for this.   I am going to add alpha lipoic acid and amitriptyline low-dose, and she can follow this up with her PCP. She is going to touch base with her spine management providers regarding her spinal cord stimulator.  Greater trochanteric bursitis, right Persistence of hip pain, she has had trochanteric bursa injections, hip joint injections, nothing is helping, adding a hip MRI, for insurance purposes she has failed greater than 6 weeks of physician directed conservative treatment including analgesics, NSAIDs, injections. Follow-up with me can be as needed, however if we see a large hip labral injury we will refer her to our hip arthroscopist.    ____________________________________________ Gwen Her. Dianah Field, M.D., ABFM., CAQSM., AME. Primary Care and Sports Medicine [facility]  Adjunct Professor of [employer]  Risk manager

## 2022-11-24 ENCOUNTER — Ambulatory Visit (INDEPENDENT_AMBULATORY_CARE_PROVIDER_SITE_OTHER): Payer: Medicare PPO

## 2022-11-24 DIAGNOSIS — M1611 Unilateral primary osteoarthritis, right hip: Secondary | ICD-10-CM | POA: Diagnosis not present

## 2022-11-24 DIAGNOSIS — G8929 Other chronic pain: Secondary | ICD-10-CM | POA: Diagnosis not present

## 2022-11-24 DIAGNOSIS — M7061 Trochanteric bursitis, right hip: Secondary | ICD-10-CM | POA: Diagnosis not present

## 2022-11-29 ENCOUNTER — Ambulatory Visit (INDEPENDENT_AMBULATORY_CARE_PROVIDER_SITE_OTHER): Payer: Medicare PPO

## 2022-11-29 ENCOUNTER — Ambulatory Visit: Payer: Medicare PPO | Attending: Rheumatology | Admitting: Rheumatology

## 2022-11-29 ENCOUNTER — Ambulatory Visit: Payer: Medicare PPO

## 2022-11-29 ENCOUNTER — Encounter: Payer: Self-pay | Admitting: Rheumatology

## 2022-11-29 VITALS — BP 126/88 | HR 68 | Resp 17 | Ht 66.0 in | Wt 231.0 lb

## 2022-11-29 DIAGNOSIS — M25562 Pain in left knee: Secondary | ICD-10-CM

## 2022-11-29 DIAGNOSIS — M25561 Pain in right knee: Secondary | ICD-10-CM | POA: Diagnosis not present

## 2022-11-29 DIAGNOSIS — M79661 Pain in right lower leg: Secondary | ICD-10-CM

## 2022-11-29 DIAGNOSIS — M79672 Pain in left foot: Secondary | ICD-10-CM

## 2022-11-29 DIAGNOSIS — M79642 Pain in left hand: Secondary | ICD-10-CM | POA: Diagnosis not present

## 2022-11-29 DIAGNOSIS — M79641 Pain in right hand: Secondary | ICD-10-CM

## 2022-11-29 DIAGNOSIS — M47816 Spondylosis without myelopathy or radiculopathy, lumbar region: Secondary | ICD-10-CM | POA: Diagnosis not present

## 2022-11-29 DIAGNOSIS — M1711 Unilateral primary osteoarthritis, right knee: Secondary | ICD-10-CM | POA: Diagnosis not present

## 2022-11-29 DIAGNOSIS — I10 Essential (primary) hypertension: Secondary | ICD-10-CM

## 2022-11-29 DIAGNOSIS — M797 Fibromyalgia: Secondary | ICD-10-CM

## 2022-11-29 DIAGNOSIS — Z840 Family history of diseases of the skin and subcutaneous tissue: Secondary | ICD-10-CM

## 2022-11-29 DIAGNOSIS — F331 Major depressive disorder, recurrent, moderate: Secondary | ICD-10-CM

## 2022-11-29 DIAGNOSIS — G8929 Other chronic pain: Secondary | ICD-10-CM

## 2022-11-29 DIAGNOSIS — M79671 Pain in right foot: Secondary | ICD-10-CM

## 2022-11-29 DIAGNOSIS — E559 Vitamin D deficiency, unspecified: Secondary | ICD-10-CM

## 2022-11-29 DIAGNOSIS — G609 Hereditary and idiopathic neuropathy, unspecified: Secondary | ICD-10-CM

## 2022-11-29 DIAGNOSIS — M7989 Other specified soft tissue disorders: Secondary | ICD-10-CM

## 2022-11-29 DIAGNOSIS — R768 Other specified abnormal immunological findings in serum: Secondary | ICD-10-CM

## 2022-11-29 DIAGNOSIS — G4733 Obstructive sleep apnea (adult) (pediatric): Secondary | ICD-10-CM

## 2022-11-29 DIAGNOSIS — M7061 Trochanteric bursitis, right hip: Secondary | ICD-10-CM | POA: Diagnosis not present

## 2022-11-29 DIAGNOSIS — R5383 Other fatigue: Secondary | ICD-10-CM

## 2022-11-29 DIAGNOSIS — R233 Spontaneous ecchymoses: Secondary | ICD-10-CM

## 2022-11-29 NOTE — Patient Instructions (Addendum)
Iliotibial Band Syndrome Rehab Ask your health care provider which exercises are safe for you. Do exercises exactly as told by your health care provider and adjust them as directed. It is normal to feel mild stretching, pulling, tightness, or discomfort as you do these exercises. Stop right away if you feel sudden pain or your pain gets significantly worse. Do not begin these exercises until told by your health care provider. Stretching and range-of-motion exercises These exercises warm up your muscles and joints and improve the movement and flexibility of your hip and pelvis. Quadriceps stretch, prone  Lie on your abdomen (prone position) on a firm surface, such as a bed or padded floor. Bend your left / right knee and reach back to hold your ankle or pant leg. If you cannot reach your ankle or pant leg, loop a belt around your foot and grab the belt instead. Gently pull your heel toward your buttocks. Your knee should not slide out to the side. You should feel a stretch in the front of your thigh and knee (quadriceps). Hold this position for __________ seconds. Repeat __________ times. Complete this exercise __________ times a day. Iliotibial band stretch An iliotibial band is a strong band of muscle tissue that runs from the outer side of your hip to the outer side of your thigh and knee. Lie on your side with your left / right leg in the top position. Bend both of your knees and grab your left / right ankle. Stretch out your bottom arm to help you balance. Slowly bring your top knee back so your thigh goes behind your trunk. Slowly lower your top leg toward the floor until you feel a gentle stretch on the outside of your left / right hip and thigh. If you do not feel a stretch and your knee will not fall farther, place the heel of your other foot on top of your knee and pull your knee down toward the floor with your foot. Hold this position for __________ seconds. Repeat __________ times.  Complete this exercise __________ times a day. Strengthening exercises These exercises build strength and endurance in your hip and pelvis. Endurance is the ability to use your muscles for a long time, even after they get tired. Straight leg raises, side-lying This exercise strengthens the muscles that rotate the leg at the hip and move it away from your body (hip abductors). Lie on your side with your left / right leg in the top position. Lie so your head, shoulder, hip, and knee line up. You may bend your bottom knee to help you balance. Roll your hips slightly forward so your hips are stacked directly over each other and your left / right knee is facing forward. Tense the muscles in your outer thigh and lift your top leg 4-6 inches (10-15 cm). Hold this position for __________ seconds. Slowly lower your leg to return to the starting position. Let your muscles relax completely before doing another repetition. Repeat __________ times. Complete this exercise __________ times a day. Leg raises, prone This exercise strengthens the muscles that move the hips backward (hip extensors). Lie on your abdomen (prone position) on your bed or a firm surface. You can put a pillow under your hips if that is more comfortable for your lower back. Bend your left / right knee so your foot is straight up in the air. Squeeze your buttocks muscles and lift your left / right thigh off the bed. Do not let your back arch. Tense   your thigh muscle as hard as you can without increasing any knee pain. Hold this position for __________ seconds. Slowly lower your leg to return to the starting position and allow it to relax completely. Repeat __________ times. Complete this exercise __________ times a day. Hip hike Stand sideways on a bottom step. Stand on your left / right leg with your other foot unsupported next to the step. You can hold on to a railing or wall for balance if needed. Keep your knees straight and your  torso square. Then lift your left / right hip up toward the ceiling. Slowly let your left / right hip lower toward the floor, past the starting position. Your foot should get closer to the floor. Do not lean or bend your knees. Repeat __________ times. Complete this exercise __________ times a day. This information is not intended to replace advice given to you by your health care provider. Make sure you discuss any questions you have with your health care provider. Document Revised: 02/09/2020 Document Reviewed: 02/09/2020 [employer] Patient Education  [employer] Exercises Hand exercises can be helpful for almost anyone. These exercises can strengthen the hands, improve flexibility and movement, and increase blood flow to the hands. These results can make work and daily tasks easier. Hand exercises can be especially helpful for people who have joint pain from arthritis or have nerve damage from overuse (carpal tunnel syndrome). These exercises can also help people who have injured a hand. Exercises Most of these hand exercises are gentle stretching and motion exercises. It is usually safe to do them often throughout the day. Warming up your hands before exercise may help to reduce stiffness. You can do this with gentle massage or by placing your hands in warm water for 10-15 minutes. It is normal to feel some stretching, pulling, tightness, or mild discomfort as you begin new exercises. This will gradually improve. Stop an exercise right away if you feel sudden, severe pain or your pain gets worse. Ask your health care provider which exercises are best for you. Knuckle bend or "claw" fist  Stand or sit with your arm, hand, and all five fingers pointed straight up. Make sure to keep your wrist straight during the exercise. Gently bend your fingers down toward your palm until the tips of your fingers are touching the top of your palm. Keep your big knuckle straight and just bend the small  knuckles in your fingers. Hold this position for __________ seconds. Straighten (extend) your fingers back to the starting position. Repeat this exercise 5-10 times with each hand. Full finger fist  Stand or sit with your arm, hand, and all five fingers pointed straight up. Make sure to keep your wrist straight during the exercise. Gently bend your fingers into your palm until the tips of your fingers are touching the middle of your palm. Hold this position for __________ seconds. Extend your fingers back to the starting position, stretching every joint fully. Repeat this exercise 5-10 times with each hand. Straight fist Stand or sit with your arm, hand, and all five fingers pointed straight up. Make sure to keep your wrist straight during the exercise. Gently bend your fingers at the big knuckle, where your fingers meet your hand, and the middle knuckle. Keep the knuckle at the tips of your fingers straight and try to touch the bottom of your palm. Hold this position for __________ seconds. Extend your fingers back to the starting position, stretching every joint fully. Repeat this  exercise 5-10 times with each hand. Tabletop  Stand or sit with your arm, hand, and all five fingers pointed straight up. Make sure to keep your wrist straight during the exercise. Gently bend your fingers at the big knuckle, where your fingers meet your hand, as far down as you can while keeping the small knuckles in your fingers straight. Think of forming a tabletop with your fingers. Hold this position for __________ seconds. Extend your fingers back to the starting position, stretching every joint fully. Repeat this exercise 5-10 times with each hand. Finger spread  Place your hand flat on a table with your palm facing down. Make sure your wrist stays straight as you do this exercise. Spread your fingers and thumb apart from each other as far as you can until you feel a gentle stretch. Hold this position  for __________ seconds. Bring your fingers and thumb tight together again. Hold this position for __________ seconds. Repeat this exercise 5-10 times with each hand. Making circles  Stand or sit with your arm, hand, and all five fingers pointed straight up. Make sure to keep your wrist straight during the exercise. Make a circle by touching the tip of your thumb to the tip of your index finger. Hold for __________ seconds. Then open your hand wide. Repeat this motion with your thumb and each finger on your hand. Repeat this exercise 5-10 times with each hand. Thumb motion  Sit with your forearm resting on a table and your wrist straight. Your thumb should be facing up toward the ceiling. Keep your fingers relaxed as you move your thumb. Lift your thumb up as high as you can toward the ceiling. Hold for __________ seconds. Bend your thumb across your palm as far as you can, reaching the tip of your thumb for the small finger (pinkie) side of your palm. Hold for __________ seconds. Repeat this exercise 5-10 times with each hand. Grip strengthening  Hold a stress ball or other soft ball in the middle of your hand. Slowly increase the pressure, squeezing the ball as much as you can without causing pain. Think of bringing the tips of your fingers into the middle of your palm. All of your finger joints should bend when doing this exercise. Hold your squeeze for __________ seconds, then relax. Repeat this exercise 5-10 times with each hand. Contact a health care provider if: Your hand pain or discomfort gets much worse when you do an exercise. Your hand pain or discomfort does not improve within 2 hours after you exercise. If you have any of these problems, stop doing these exercises right away. Do not do them again unless your health care provider says that you can. Get help right away if: You develop sudden, severe hand pain or swelling. If this happens, stop doing these exercises right away.  Do not do them again unless your health care provider says that you can. This information is not intended to replace advice given to you by your health care provider. Make sure you discuss any questions you have with your health care provider. Document Revised: 03/22/2021 Document Reviewed: 03/22/2021 [employer] Patient Education  [place].

## 2022-11-30 LAB — C3 AND C4
C3 Complement: 155 mg/dL (ref 83–193)
C4 Complement: 38 mg/dL (ref 15–57)

## 2022-11-30 LAB — CK: Total CK: 69 U/L (ref 29–143)

## 2022-11-30 LAB — ALT: ALT: 32 U/L — ABNORMAL HIGH (ref 6–29)

## 2022-11-30 LAB — RNP ANTIBODY: Ribonucleic Protein(ENA) Antibody, IgG: <1 AI

## 2022-11-30 LAB — SJOGRENS SYNDROME-A EXTRACTABLE NUCLEAR ANTIBODY: SSA (Ro) (ENA) Antibody, IgG: <1 AI

## 2022-11-30 LAB — ANTI-SMITH ANTIBODY: ENA SM Ab Ser-aCnc: <1 AI

## 2022-11-30 LAB — SJOGRENS SYNDROME-B EXTRACTABLE NUCLEAR ANTIBODY: SSB (La) (ENA) Antibody, IgG: <1 AI

## 2022-11-30 LAB — AST: AST: 29 U/L (ref 10–35)

## 2022-11-30 LAB — ANTI-SCLERODERMA ANTIBODY: Scleroderma (Scl-70) (ENA) Antibody, IgG: <1 AI

## 2022-11-30 LAB — ANTI-DNA ANTIBODY, DOUBLE-STRANDED: ds DNA Ab: <1 IU/mL

## 2022-12-02 NOTE — Progress Notes (Signed)
I will discuss results at the fu visit. LFTs are elevated most likely due to the use of Mobic. Pt. Should avoid the use of NSAIDs.

## 2022-12-19 NOTE — Progress Notes (Signed)
Office Visit Note  Patient: Linda Buckley             Date of Birth: 06/06/1956           MRN: [id number]             PCP: Midge Minium, MD Referring: Midge Minium, MD Visit Date: 12/25/2022 Occupation: '@GUAROCC'$ @  Subjective:  Pain in both hips  History of Present Illness: Linda Buckley is a 67 y.o. female with history of positive ANA, fibromyalgia and osteoarthritis.  She states she continues to have pain and stiffness in multiple joints.  She complains of pain and stiffness in her both hands, both knees, both hips and her feet.  She continues to have lower back pain.  She has generalized pain and discomfort from fibromyalgia.  She has not noticed joint swelling.  She denies any history of oral ulcers, nasal ulcers, malar rash, photosensitivity, Raynaud's, lymphadenopathy or inflammatory arthritis.  She stopped meloxicam after she found out that her LFTs were elevated.    Activities of Daily Living:  Patient reports morning stiffness for 10-15 minutes.   Patient Reports nocturnal pain.  Difficulty dressing/grooming: Reports Difficulty climbing stairs: Reports Difficulty getting out of chair: Reports Difficulty using hands for taps, buttons, cutlery, and/or writing: Reports  Review of Systems  Constitutional:  Positive for fatigue.  HENT:  Positive for mouth dryness. Negative for mouth sores.   Eyes:  Negative for dryness.  Respiratory:  Negative for shortness of breath.   Cardiovascular:  Negative for chest pain and palpitations.  Gastrointestinal:  Negative for blood in stool, constipation and diarrhea.  Endocrine: Negative for increased urination.  Genitourinary:  Negative for involuntary urination.  Musculoskeletal:  Positive for joint pain, gait problem, joint pain, joint swelling, myalgias, muscle weakness, morning stiffness, muscle tenderness and myalgias.  Skin:  Positive for hair loss. Negative for color change, rash and sensitivity to sunlight.   Allergic/Immunologic: Positive for susceptible to infections.  Neurological:  Positive for headaches. Negative for dizziness.  Hematological:  Negative for swollen glands.  Psychiatric/Behavioral:  Positive for sleep disturbance. Negative for depressed mood. The patient is not nervous/anxious.     PMFS History:  Patient Active Problem List   Diagnosis Date Noted   Elevated antinuclear antibody (ANA) level 05/26/2022   Easy bruising 05/26/2022   Right ankle pain 11/23/2021   Primary osteoarthritis of right knee 08/16/2021   Lumbar spondylosis 07/03/2021   Poor compliance with CPAP treatment 02/01/2020   OSA on CPAP 02/01/2020   Chronic left shoulder pain 09/17/2019   MDD (major depressive disorder), recurrent episode, moderate ([place]) 05/05/2018   Vitamin D deficiency 11/05/2017   Dysphagia 09/02/2017   Multiple falls 09/04/2016   Hereditary and idiopathic peripheral neuropathy 04/03/2016   Exertional shortness of breath 01/26/2016   Left leg pain 01/26/2016   Greater trochanteric bursitis, right 12/19/2015   Fibromyalgia 07/28/2015   Right wrist pain 04/05/2015   Physical exam 03/01/2014   HTN (hypertension) 01/12/2014   Obesity (BMI 30-39.9) 01/12/2014   Pre-diabetes 01/12/2014   Family history of lupus erythematosus 01/12/2014   Fatigue 10/17/2013    Past Medical History:  Diagnosis Date   Allergy    Anxiety    Arthritis    Carpal tunnel syndrome of right wrist    RECURRENT   Depression    Dysphagia 09/02/2017   Edema 09/02/2017   Environmental allergies    dust mites, pollen, roaches and other insects, mold   Fibromyalgia 04/2016  Headache    migraines   Heart murmur    Hereditary and idiopathic peripheral neuropathy 04/03/2016   History of adenomatous polyp of colon    History of gastric ulcer    History of kidney stones    Hypertension    Pneumonia    Sleep apnea    wears CPAP   Wears glasses     Family History  Problem Relation Age of Onset    Arthritis Mother    Hypertension Mother    Diabetes Mother    Cancer Mother    Stomach cancer Mother    Asthma Father    Diabetes Father    Diabetes Brother    Diabetes Brother    Cancer Maternal Aunt        breast   Cancer Maternal Uncle        lung   Colon cancer Maternal Uncle    Cancer Maternal Grandmother        ovarian   Cancer Maternal Grandfather        colon   Colon cancer Maternal Grandfather    Asthma Son    Alcohol abuse Son    Anxiety disorder Son    Schizophrenia Son    Diabetes Son    Esophageal cancer Neg Hx    Past Surgical History:  Procedure Laterality Date   CARPAL TUNNEL RELEASE Right 2007   CARPAL TUNNEL RELEASE Right 07/05/2015   Procedure: RIGHT HAND REVISION CARPAL TUNNEL RELEASE;  Surgeon: Iran Planas, MD;  Location: [facility];  Service: Orthopedics;  Laterality: Right;   CARPAL TUNNEL RELEASE Left 09/2016   CERVICAL DISC ARTHROPLASTY  03/2015   COLONOSCOPY W/ POLYPECTOMY  04/15/2014   CYSTO/  RIGHT RETROGRADE PYELOGRAM/ URETEROSCOPY STONE EXTRACTION/  STENT PLACEMENT  06/07/2010   FINGER ARTHRODESIS Right 08/10/2018   Procedure: RIGHT THUMB ARTHRODESIS;  Surgeon: Milly Jakob, MD;  Location: [facility];  Service: Orthopedics;  Laterality: Right;   LAPAROSCOPIC ASSISTED VAGINAL HYSTERECTOMY  04/25/2004   ovaries remain   LAPAROSCOPIC GASTRIC BAND REMOVAL WITH LAPAROSCOPIC GASTRIC SLEEVE RESECTION  10/26/2018   SPINAL CORD STIMULATOR IMPLANT  02/25/2018   [employer] Model- [phone] [email or web address]   TUBAL LIGATION  1982   UPPER GASTROINTESTINAL ENDOSCOPY  12/08/2017   Social History   Social History Narrative   Lives at home w/ her husband   Right-handed   Drinks 2 cups of coffee weekly    3 bottle water per day   Immunization History  Administered Date(s) Administered   Influenza,inj,Quad PF,6+ Mos 09/15/2014, 10/24/2015, 09/08/2017, 01/29/2019   Influenza-Unspecified  12/25/2020, 10/12/2021   Moderna SARS-COV2 Booster Vaccination 10/12/2021   PFIZER(Purple Top)SARS-COV-2 Vaccination 02/29/2020, 03/21/2020, 10/10/2020   Pfizer Covid-19 Vaccine Bivalent Booster 149yr& up 01/23/2022   Tdap 11/05/2017     Objective: Vital Signs: BP 131/86 (BP Location: Left Arm, Patient Position: Sitting, Cuff Size: Large)   Pulse 76   Ht 5' 6.5" (1.689 m)   Wt 229 lb 9.6 oz (104.1 kg)   BMI 36.50 kg/m    Physical Exam Vitals and nursing note reviewed.  Constitutional:      Appearance: She is well-developed.  HENT:     Head: Normocephalic and atraumatic.  Eyes:     Conjunctiva/sclera: Conjunctivae normal.  Cardiovascular:     Rate and Rhythm: Normal rate and regular rhythm.     Heart sounds: Normal heart sounds.  Pulmonary:     Effort: Pulmonary effort is normal.     Breath sounds: Normal  breath sounds.  Abdominal:     General: Bowel sounds are normal.     Palpations: Abdomen is soft.  Musculoskeletal:     Cervical back: Normal range of motion.  Lymphadenopathy:     Cervical: No cervical adenopathy.  Skin:    General: Skin is warm and dry.     Capillary Refill: Capillary refill takes less than 2 seconds.  Neurological:     Mental Status: She is alert and oriented to person, place, and time.  Psychiatric:        Behavior: Behavior normal.      Musculoskeletal Exam: Cervical spine was in good range of motion.  She had limited range of motion of lumbar spine.  Shoulders, elbows, wrist joints were in good range of motion without synovitis.  She had no tenderness over MCPs PIPs or DIPs.  She has surgical fusion of her right first MCP joint.  Hip joints and knee joints with good range of motion without any warmth swelling or effusion.  There was no tenderness over ankles or MTPs.  She had hyperalgesia and positive tender points.  CDAI Exam: CDAI Score: -- Patient Global: --; Provider Global: -- Swollen: --; Tender: -- Joint Exam 12/25/2022   No joint  exam has been documented for this visit   There is currently no information documented on the homunculus. Go to the Rheumatology activity and complete the homunculus joint exam.  Investigation: No additional findings.  Imaging: XR Foot 2 Views Left  Result Date: 11/29/2022 First MTP, PIP and DIP narrowing was noted.  Dorsal spurring was noted.  No intertarsal, tibiotalar or subtalar joint space narrowing was noted.  Inferior and posterior calcaneal spurs were noted.  No erosive changes were noted. Impression: These findings are consistent with osteoarthritis of the foot.  XR Foot 2 Views Right  Result Date: 11/29/2022 First MTP, PIP and DIP narrowing was noted.  Postsurgical changes of the first metatarsal were noted.  No intertarsal, tibiotalar or subtalar joint space narrowing was noted.  Inferior calcaneal spur was noted. Impression: These findings are consistent with osteoarthritis of the foot.  XR KNEE 3 VIEW LEFT  Result Date: 11/29/2022 Mild medial compartment narrowing was noted.  No patellofemoral narrowing was noted.  No chondrocalcinosis was noted. Impression: These findings are consistent with mild osteoarthritis of the knee.  XR KNEE 3 VIEW RIGHT  Result Date: 11/29/2022 Mild medial compartment narrowing was noted.  Mild patellofemoral narrowing was noted.  No chondrocalcinosis was noted. Impression: These findings are consistent with mild osteoarthritis and mild chondromalacia patella.  XR Hand 2 View Left  Result Date: 11/29/2022 CMC, PIP and DIP narrowing was noted.  No MCP, intercarpal or radiocarpal joint space narrowing was noted.  No erosive changes were noted. Impression: These findings are consistent with osteoarthritis of the hand.  XR Hand 2 View Right  Result Date: 11/29/2022 CMC, PIP and DIP narrowing was noted.  Hardware was noted in the first MCP.  No MCP, PIP or DIP narrowing was noted.  No erosive changes were noted. Impression: These findings are  consistent with osteoarthritis of the hand.   Recent Labs: Lab Results  Component Value Date   WBC 4.2 11/01/2022   HGB 13.6 11/01/2022   PLT 241 11/01/2022   NA 144 11/01/2022   K 3.8 11/01/2022   CL 108 11/01/2022   CO2 30 11/01/2022   GLUCOSE 111 (H) 11/01/2022   BUN 20 11/01/2022   CREATININE 0.88 11/01/2022   BILITOT 0.6 01/23/2022  ALKPHOS 63 01/23/2022   AST 29 11/29/2022   ALT 32 (H) 11/29/2022   PROT 6.6 01/23/2022   ALBUMIN 3.9 01/23/2022   CALCIUM 10.3 11/01/2022   GFRAA >60 08/04/2018   November 29, 2022 AST 29 ALT 32 CK 69, ENA (double-stranded DNA, SSA, SSB, Smith, SCL 70, RNP negative), C3-C4 normal  Speciality Comments: No specialty comments available.  Procedures:  Large Joint Inj: bilateral greater trochanter on 12/25/2022 11:49 AM Indications: pain Details: 27 G 1.5 in needle, lateral approach  Arthrogram: No  Medications (Right): 1.5 mL lidocaine 1 %; 40 mg triamcinolone acetonide 40 MG/ML Aspirate (Right): 0 mL Medications (Left): 1.5 mL lidocaine 1 %; 40 mg triamcinolone acetonide 40 MG/ML Aspirate (Left): 0 mL Outcome: tolerated well, no immediate complications Procedure, treatment alternatives, risks and benefits explained, specific risks discussed. Consent was given by the patient. Immediately prior to procedure a time out was called to verify the correct patient, procedure, equipment, support staff and site/side marked as required. Patient was prepped and draped in the usual sterile fashion.     Allergies: Aspirin and Bee venom   Assessment / Plan:     Visit Diagnoses: Positive ANA (antinuclear antibody) - Low titer positive ANA, ENA negative, complements normal.  Lab results were discussed with the patient.  She gives  history of fatigue, dry mouth and dry eyes which is most likely related to the medication use.  She denies any history of oral ulcers, nasal ulcers, malar rash, photosensitivity, Raynaud's or lymphadenopathy.  There was no  synovitis on the examination.  At this point she does not have any clinical features of autoimmune disease.  She was advised to contact us if she develops any new symptoms.  Primary osteoarthritis of both hands - History of discomfort in bilateral hands.  Status post fusion of her right first MCP joint.  X-rays were consistent with osteoarthritis.  Counseled regarding osteoarthritis was provided.  Joint protection muscle strengthening was discussed.  Primary osteoarthritis of right hip - This time 10/2002 MRI showed mild osteoarthritis right hip and mild tendinosis of the bilateral hamstrings.  Bilateral trochanteric bursitis-she had pain and tenderness over bilateral trochanteric bursa.  She has difficulty walking due to trochanteric bursa pain.  She also has nocturnal pain when she sleeps on the side.  Different treatment options and side effects were discussed.  Patient wanted to proceed with the cortisone injection as she has been experiencing severe pain.  After informed consent was obtained bilateral trochanteric bursa were injected with lidocaine and cortisone as described above.  Patient tolerated the procedure well.  Postprocedure instructions were given.  A handout on IT band stretches was given.  I will also refer her to physical therapy.  Primary osteoarthritis of both knees - History of pain in bilateral knee joints.  No warmth swelling or effusion was noted.  X-rays showed mild osteoarthritis.  X-ray findings were discussed with the patient.  Lower extremity muscle strengthening exercises and weight loss was emphasized.  Primary osteoarthritis of both feet - S/p right bunionectomy.  X-ray showed osteoarthritic changes.  X-ray findings were discussed with the patient.  No synovitis was noted.  Lumbar spondylosis - She is followed by a pain management.  She had a spinal stimulator implant in 2019.  Fibromyalgia-she complains of generalized pain, hyperalgesia and positive tender points.  She  would benefit from physical therapy.  I will refer her to integrative therapy.  Benefits of water aerobics and swimming were discussed.  Other fatigue-she continues to  have some fatigue.  Primary hypertension-blood pressure was mildly elevated at 131/86 today.  Vitamin D deficiency-she takes vitamin D.  Other medical problems are listed as follows:  OSA on CPAP  Hereditary and idiopathic peripheral neuropathy  MDD (major depressive disorder), recurrent episode, moderate ([facility])  Family history of lupus erythematosus-cousins, maternal aunt  Orders: Orders Placed This Encounter  Procedures   Large Joint Inj   No orders of the defined types were placed in this encounter.    Follow-Up Instructions: Return in about 6 months (around 06/25/2023), or if symptoms worsen or fail to improve, for Osteoarthritis.   Bo Merino, MD  Note - This record has been created using Editor, commissioning.  Chart creation errors have been sought, but may not always  have been located. Such creation errors do not reflect on  the standard of medical care.

## 2022-12-25 ENCOUNTER — Encounter: Payer: Self-pay | Admitting: Rheumatology

## 2022-12-25 ENCOUNTER — Ambulatory Visit: Payer: Medicare PPO | Attending: Rheumatology | Admitting: Rheumatology

## 2022-12-25 VITALS — BP 131/86 | HR 76 | Ht 66.5 in | Wt 229.6 lb

## 2022-12-25 DIAGNOSIS — M7062 Trochanteric bursitis, left hip: Secondary | ICD-10-CM | POA: Diagnosis not present

## 2022-12-25 DIAGNOSIS — G4733 Obstructive sleep apnea (adult) (pediatric): Secondary | ICD-10-CM

## 2022-12-25 DIAGNOSIS — M7061 Trochanteric bursitis, right hip: Secondary | ICD-10-CM

## 2022-12-25 DIAGNOSIS — M19041 Primary osteoarthritis, right hand: Secondary | ICD-10-CM | POA: Diagnosis not present

## 2022-12-25 DIAGNOSIS — R768 Other specified abnormal immunological findings in serum: Secondary | ICD-10-CM

## 2022-12-25 DIAGNOSIS — M797 Fibromyalgia: Secondary | ICD-10-CM | POA: Diagnosis not present

## 2022-12-25 DIAGNOSIS — M19071 Primary osteoarthritis, right ankle and foot: Secondary | ICD-10-CM

## 2022-12-25 DIAGNOSIS — M19042 Primary osteoarthritis, left hand: Secondary | ICD-10-CM

## 2022-12-25 DIAGNOSIS — G609 Hereditary and idiopathic neuropathy, unspecified: Secondary | ICD-10-CM

## 2022-12-25 DIAGNOSIS — M47816 Spondylosis without myelopathy or radiculopathy, lumbar region: Secondary | ICD-10-CM

## 2022-12-25 DIAGNOSIS — M170 Bilateral primary osteoarthritis of knee: Secondary | ICD-10-CM | POA: Diagnosis not present

## 2022-12-25 DIAGNOSIS — R5383 Other fatigue: Secondary | ICD-10-CM | POA: Diagnosis not present

## 2022-12-25 DIAGNOSIS — M1611 Unilateral primary osteoarthritis, right hip: Secondary | ICD-10-CM

## 2022-12-25 DIAGNOSIS — Z840 Family history of diseases of the skin and subcutaneous tissue: Secondary | ICD-10-CM

## 2022-12-25 DIAGNOSIS — I10 Essential (primary) hypertension: Secondary | ICD-10-CM

## 2022-12-25 DIAGNOSIS — F331 Major depressive disorder, recurrent, moderate: Secondary | ICD-10-CM

## 2022-12-25 DIAGNOSIS — E559 Vitamin D deficiency, unspecified: Secondary | ICD-10-CM

## 2022-12-25 DIAGNOSIS — M19072 Primary osteoarthritis, left ankle and foot: Secondary | ICD-10-CM

## 2022-12-25 DIAGNOSIS — R7689 Other specified abnormal immunological findings in serum: Secondary | ICD-10-CM

## 2022-12-25 MED ORDER — TRIAMCINOLONE ACETONIDE 40 MG/ML IJ SUSP
40.0000 mg | INTRAMUSCULAR | Status: AC | PRN
  Administered 2022-12-25: 40 mg via INTRA_ARTICULAR

## 2022-12-25 MED ORDER — LIDOCAINE HCL 1 % IJ SOLN
1.5000 mL | INTRAMUSCULAR | Status: AC | PRN
  Administered 2022-12-25: 1.5 mL

## 2022-12-25 NOTE — Addendum Note (Signed)
Addended by: Francis Gaines C on: 12/25/2022 12:09 PM   Modules accepted: Orders

## 2022-12-25 NOTE — Patient Instructions (Signed)
Iliotibial Band Syndrome Rehab Ask your health care provider which exercises are safe for you. Do exercises exactly as told by your health care provider and adjust them as directed. It is normal to feel mild stretching, pulling, tightness, or discomfort as you do these exercises. Stop right away if you feel sudden pain or your pain gets significantly worse. Do not begin these exercises until told by your health care provider. Stretching and range-of-motion exercises These exercises warm up your muscles and joints and improve the movement and flexibility of your hip and pelvis. Quadriceps stretch, prone  Lie on your abdomen (prone position) on a firm surface, such as a bed or padded floor. Bend your left / right knee and reach back to hold your ankle or pant leg. If you cannot reach your ankle or pant leg, loop a belt around your foot and grab the belt instead. Gently pull your heel toward your buttocks. Your knee should not slide out to the side. You should feel a stretch in the front of your thigh and knee (quadriceps). Hold this position for __________ seconds. Repeat __________ times. Complete this exercise __________ times a day. Iliotibial band stretch An iliotibial band is a strong band of muscle tissue that runs from the outer side of your hip to the outer side of your thigh and knee. Lie on your side with your left / right leg in the top position. Bend both of your knees and grab your left / right ankle. Stretch out your bottom arm to help you balance. Slowly bring your top knee back so your thigh goes behind your trunk. Slowly lower your top leg toward the floor until you feel a gentle stretch on the outside of your left / right hip and thigh. If you do not feel a stretch and your knee will not fall farther, place the heel of your other foot on top of your knee and pull your knee down toward the floor with your foot. Hold this position for __________ seconds. Repeat __________ times.  Complete this exercise __________ times a day. Strengthening exercises These exercises build strength and endurance in your hip and pelvis. Endurance is the ability to use your muscles for a long time, even after they get tired. Straight leg raises, side-lying This exercise strengthens the muscles that rotate the leg at the hip and move it away from your body (hip abductors). Lie on your side with your left / right leg in the top position. Lie so your head, shoulder, hip, and knee line up. You may bend your bottom knee to help you balance. Roll your hips slightly forward so your hips are stacked directly over each other and your left / right knee is facing forward. Tense the muscles in your outer thigh and lift your top leg 4-6 inches (10-15 cm). Hold this position for __________ seconds. Slowly lower your leg to return to the starting position. Let your muscles relax completely before doing another repetition. Repeat __________ times. Complete this exercise __________ times a day. Leg raises, prone This exercise strengthens the muscles that move the hips backward (hip extensors). Lie on your abdomen (prone position) on your bed or a firm surface. You can put a pillow under your hips if that is more comfortable for your lower back. Bend your left / right knee so your foot is straight up in the air. Squeeze your buttocks muscles and lift your left / right thigh off the bed. Do not let your back arch. Tense   your thigh muscle as hard as you can without increasing any knee pain. Hold this position for __________ seconds. Slowly lower your leg to return to the starting position and allow it to relax completely. Repeat __________ times. Complete this exercise __________ times a day. Hip hike Stand sideways on a bottom step. Stand on your left / right leg with your other foot unsupported next to the step. You can hold on to a railing or wall for balance if needed. Keep your knees straight and your  torso square. Then lift your left / right hip up toward the ceiling. Slowly let your left / right hip lower toward the floor, past the starting position. Your foot should get closer to the floor. Do not lean or bend your knees. Repeat __________ times. Complete this exercise __________ times a day. This information is not intended to replace advice given to you by your health care provider. Make sure you discuss any questions you have with your health care provider. Document Revised: 02/09/2020 Document Reviewed: 02/09/2020 Elsevier Patient Education  2023 [employer]  

## 2023-01-07 ENCOUNTER — Ambulatory Visit (INDEPENDENT_AMBULATORY_CARE_PROVIDER_SITE_OTHER): Payer: Medicare PPO | Admitting: Sports Medicine

## 2023-01-07 DIAGNOSIS — M160 Bilateral primary osteoarthritis of hip: Secondary | ICD-10-CM | POA: Diagnosis not present

## 2023-01-07 MED ORDER — CELECOXIB 200 MG PO CAPS: ORAL_CAPSULE | ORAL | 2 refills | Status: DC

## 2023-01-07 NOTE — Assessment & Plan Note (Signed)
Pleasant 67 year old female, chronic right anterior and lateral hip pain, she has had trochanteric bursa injections, we did a hip joint injection in October, she had some relief, she had trochanteric bursa injections by Dr. Estanislado Pandy recently, hip MRI shows hip osteoarthritis but no evidence of bursitis or hip abductor tendinosis. Her meloxicam was stopped due to elevation in her LFTs. I explained her that meloxicam was typically cleared mostly through the kidneys and laboratory abnormalities would usually manifest as a decrease in GFR rather than an increase in LFTs. Her ALT went up to as high as 32. She did notice a significant increase in pain when discontinuing meloxicam. For this reason we are going to restart NSAIDs, but this time Celebrex. She has had a lot of steroids recently so we will wait at least another 3 months before considering an additional right hip joint injection. Home physical therapy given.

## 2023-01-07 NOTE — Progress Notes (Signed)
    Procedures performed today:    None.  Independent interpretation of notes and tests performed by another provider:   None.  Brief History, Exam, Impression, and Recommendations:    Primary osteoarthritis of both hips Pleasant 67 year old female, chronic right anterior and lateral hip pain, she has had trochanteric bursa injections, we did a hip joint injection in October, she had some relief, she had trochanteric bursa injections by Dr. Estanislado Pandy recently, hip MRI shows hip osteoarthritis but no evidence of bursitis or hip abductor tendinosis. Her meloxicam was stopped due to elevation in her LFTs. I explained her that meloxicam was typically cleared mostly through the kidneys and laboratory abnormalities would usually manifest as a decrease in GFR rather than an increase in LFTs. Her ALT went up to as high as 32. She did notice a significant increase in pain when discontinuing meloxicam. For this reason we are going to restart NSAIDs, but this time Celebrex. She has had a lot of steroids recently so we will wait at least another 3 months before considering an additional right hip joint injection. Home physical therapy given.    ____________________________________________ Gwen Her. Dianah Field, M.D., ABFM., CAQSM., AME. Primary Care and Sports Medicine [facility]  Adjunct Professor of [place] [employer] [facility] [employer] Medicine  Risk manager

## 2023-01-14 ENCOUNTER — Telehealth: Payer: Self-pay

## 2023-01-14 MED ORDER — TRIAMCINOLONE ACETONIDE 0.1 % EX OINT: 1.0000 | TOPICAL_OINTMENT | Freq: Two times a day (BID) | CUTANEOUS | 1 refills | Status: AC

## 2023-01-14 NOTE — Telephone Encounter (Signed)
Prescription sent to pharmacy.  If rash doesn't improve, will need appt

## 2023-01-14 NOTE — Addendum Note (Signed)
Addended by: Midge Minium on: 01/14/2023 04:13 PM   Modules accepted: Orders

## 2023-01-14 NOTE — Telephone Encounter (Signed)
Pt is asking for a refill on Triamcinolone 0.1 % . Last filled 02/05/21 and last OV 05-23-22. Pt states she has a rash on her ankle and she was given this for a rash in the past inhaled corticosteroids this ok to refill?

## 2023-01-15 ENCOUNTER — Telehealth: Payer: Medicare PPO | Admitting: Family Medicine

## 2023-01-15 NOTE — Telephone Encounter (Signed)
Notified pt that Rx was sent in and if rash does not improve she will need an apt . Pt expressed verbal understanding

## 2023-01-24 ENCOUNTER — Encounter: Payer: Medicare PPO | Admitting: Family Medicine

## 2023-01-29 ENCOUNTER — Ambulatory Visit (INDEPENDENT_AMBULATORY_CARE_PROVIDER_SITE_OTHER): Payer: Medicare PPO | Admitting: Family Medicine

## 2023-01-29 ENCOUNTER — Encounter: Payer: Self-pay | Admitting: Family Medicine

## 2023-01-29 VITALS — BP 130/84 | HR 78 | Temp 97.9°F | Resp 18 | Ht 66.5 in | Wt 231.0 lb

## 2023-01-29 DIAGNOSIS — B9689 Other specified bacterial agents as the cause of diseases classified elsewhere: Secondary | ICD-10-CM

## 2023-01-29 DIAGNOSIS — J329 Chronic sinusitis, unspecified: Secondary | ICD-10-CM

## 2023-01-29 DIAGNOSIS — R059 Cough, unspecified: Secondary | ICD-10-CM | POA: Diagnosis not present

## 2023-01-29 LAB — POC COVID19 BINAXNOW: SARS Coronavirus 2 Ag: NEGATIVE

## 2023-01-29 MED ORDER — ALBUTEROL SULFATE HFA 108 (90 BASE) MCG/ACT IN AERS: 2.0000 | INHALATION_SPRAY | Freq: Four times a day (QID) | RESPIRATORY_TRACT | 0 refills | Status: DC | PRN

## 2023-01-29 MED ORDER — AMOXICILLIN 875 MG PO TABS: 875.0000 mg | ORAL_TABLET | Freq: Two times a day (BID) | ORAL | 0 refills | Status: AC

## 2023-01-29 NOTE — Patient Instructions (Addendum)
Follow up as needed or as scheduled START the Amoxicillin twice daily- take w/ food- for the sinus infection USE the Albuterol inhaler- 2 puffs as needed for shortness of breath or wheezing Continue to use Tylenol and ibuprofen as needed for pain/headache/fever ADD Robitussin or Delsym as needed Call with any questions or concerns Hang in there!!!

## 2023-01-29 NOTE — Progress Notes (Signed)
   Subjective:    Patient ID: Linda Buckley, female    DOB: [birth year], 67 y.o.   MRN: [id number]  HPI 'feeling crappy'- sxs started 01/15/23 and was COVID + on 2/1.  Pt reports she continues to feel exhausted.  Cough continues and now chest is sore.  Sore throat is improving.  + HA- 'major HA'.  Taking Tylenol w/o relief.  Some SOB.  Occasional wheezing at night.  No fever.  + facial pain, tooth pain.   Review of Systems For ROS see HPI     Objective:   Physical Exam Vitals reviewed.  Constitutional:      General: She is not in acute distress.    Appearance: Normal appearance. She is well-developed. She is not ill-appearing.  HENT:     Head: Normocephalic and atraumatic.     Right Ear: Tympanic membrane normal.     Left Ear: Tympanic membrane normal.     Nose: Mucosal edema and congestion present. No rhinorrhea.     Right Sinus: Maxillary sinus tenderness and frontal sinus tenderness present.     Left Sinus: Maxillary sinus tenderness and frontal sinus tenderness present.     Mouth/Throat:     Pharynx: Uvula midline. Posterior oropharyngeal erythema present. No oropharyngeal exudate.  Eyes:     Conjunctiva/sclera: Conjunctivae normal.     Pupils: Pupils are equal, round, and reactive to light.  Cardiovascular:     Rate and Rhythm: Normal rate and regular rhythm.     Pulses: Normal pulses.     Heart sounds: Normal heart sounds.  Pulmonary:     Effort: Pulmonary effort is normal. No respiratory distress.     Breath sounds: Normal breath sounds. No wheezing.  Musculoskeletal:     Cervical back: Normal range of motion and neck supple.  Lymphadenopathy:     Cervical: No cervical adenopathy.  Skin:    General: Skin is warm and dry.  Neurological:     General: No focal deficit present.     Mental Status: She is alert and oriented to person, place, and time.     Cranial Nerves: No cranial nerve deficit.     Motor: No weakness.     Coordination: Coordination normal.   Psychiatric:        Mood and Affect: Mood normal.        Behavior: Behavior normal.        Thought Content: Thought content normal.           Assessment & Plan:  Bacterial sinusitis- new.  Pt likely had congestion and inflammation from her recent bout w/ COVID and this got secondarily infected.  Start Amoxicillin BID.  Reviewed supportive care and red flags that should prompt return.  Pt expressed understanding and is in agreement w/ plan.   Cough- new.  Pt is not negative for COVID but dry cough, SOB, and occasional wheezing remain.  Start Albuterol prn.  Pt expressed understanding and is in agreement w/ plan.

## 2023-02-09 ENCOUNTER — Other Ambulatory Visit: Payer: Self-pay | Admitting: Family Medicine

## 2023-02-14 ENCOUNTER — Other Ambulatory Visit: Payer: Self-pay

## 2023-02-14 MED ORDER — CLONAZEPAM 0.5 MG PO TABS: ORAL_TABLET | ORAL | 3 refills | Status: DC

## 2023-02-14 NOTE — Telephone Encounter (Signed)
Clonazepam 0.5 mg  LOV: 01/29/23 Last Refill:07/03/22 Upcoming appt: 03/06/23

## 2023-02-14 NOTE — Telephone Encounter (Signed)
Pt aware that Rx has been sent in

## 2023-02-26 ENCOUNTER — Telehealth: Payer: Self-pay | Admitting: Family Medicine

## 2023-02-26 NOTE — Telephone Encounter (Signed)
Contacted Linda Buckley to schedule their annual wellness visit. Appointment made for 03/05/2023.  Thank you,  [place] Direct dial  [phone]

## 2023-03-05 ENCOUNTER — Ambulatory Visit (INDEPENDENT_AMBULATORY_CARE_PROVIDER_SITE_OTHER): Payer: Medicare PPO | Admitting: Family Medicine

## 2023-03-05 DIAGNOSIS — Z0000 Encounter for general adult medical examination without abnormal findings: Secondary | ICD-10-CM | POA: Diagnosis not present

## 2023-03-05 DIAGNOSIS — Z1231 Encounter for screening mammogram for malignant neoplasm of breast: Secondary | ICD-10-CM

## 2023-03-05 DIAGNOSIS — Z780 Asymptomatic menopausal state: Secondary | ICD-10-CM

## 2023-03-05 NOTE — Progress Notes (Unsigned)
Subjective:   MARGUETTA IMEL is a 67 y.o. female who presents for Medicare Annual (Subsequent) preventive examination.  I connected with  [name] on 03/05/23 by a telephone  enabled telemedicine application and verified that I am speaking with the correct person using two identifiers.   I discussed the limitations of evaluation and management by telemedicine. The patient expressed understanding and agreed to proceed.  Patient location: home  Provider location: home/telephone   Review of Systems     Cardiac Risk Factors include: advanced age (>52men, >47 women);obesity (BMI >30kg/m2);hypertension;sedentary lifestyle     Objective:    Today's Vitals   03/05/23 1534 03/05/23 1535  PainSc: 8  8    There is no height or weight on file to calculate BMI.     03/05/2023    3:46 PM 09/02/2022   10:23 AM 02/07/2022    1:34 PM 07/05/2021    3:18 PM 02/05/2021   10:38 AM 08/10/2018    7:27 AM 08/04/2018   11:28 AM  Advanced Directives  Does Patient Have a Medical Advance Directive? No No Yes No No No No  Type of Advance Directive   [place] in Chart?   No - copy requested      Would patient like information on creating a medical advance directive? No - Patient declined Yes (MAU/Ambulatory/Procedural Areas - Information given)  No - Patient declined Yes (MAU/Ambulatory/Procedural Areas - Information given) No - Patient declined No - Patient declined    Current Medications (verified) Outpatient Encounter Medications as of 03/05/2023  Medication Sig   albuterol (VENTOLIN HFA) 108 (90 Base) MCG/ACT inhaler Inhale 2 puffs into the lungs every 6 (six) hours as needed for wheezing or shortness of breath.   Alpha-Lipoic Acid 600 MG CAPS Take 1 capsule (600 mg total) by mouth daily.   amitriptyline (ELAVIL) 50 MG tablet One half tab PO qHS for a week, then one tab PO qHS.   buPROPion (WELLBUTRIN XL) 150 MG 24 hr tablet  TAKE 1 TABLET BY MOUTH  DAILY   buPROPion (WELLBUTRIN XL) 300 MG 24 hr tablet Take 1 tablet (300 mg total) by mouth every morning.   busPIRone (BUSPAR) 15 MG tablet Take 1 tablet (15 mg total) by mouth 2 (two) times daily.   cetirizine (ZYRTEC) 10 MG tablet TAKE 1 TABLET(10 MG) BY MOUTH DAILY   Cholecalciferol (VITAMIN D PO) Take 1 tablet by mouth daily. 1000units   Cholecalciferol (VITAMIN D3 PO) Take by mouth.   clonazePAM (KLONOPIN) 0.5 MG tablet TAKE ONE TABLET BY MOUTH EVERY NIGHT AT BEDTIME   Cyanocobalamin (VITAMIN B-12 PO) Take by mouth.   DULoxetine (CYMBALTA) 60 MG capsule TAKE 1 CAPSULE DAILY FOR 2 WEEKS AND THEN INCREASE TO 1 CAPSULE TWICE DAILY   gabapentin (NEURONTIN) 600 MG tablet Take 1 tablet (600 mg total) by mouth 3 (three) times daily.   Multiple Vitamin (MULTIVITAMIN) capsule Take 1 capsule by mouth daily. With iron   traMADol (ULTRAM) 50 MG tablet Take 1-2 tablets (50-100 mg total) by mouth 3 (three) times daily as needed.   triamcinolone ointment (KENALOG) 0.1 % Apply 1 Application topically 2 (two) times daily.   VITAMIN E PO Take by mouth.   No facility-administered encounter medications on file as of 03/05/2023.    Allergies (verified) Aspirin and Bee venom   History: Past Medical History:  Diagnosis Date   Allergy  Anxiety    Arthritis    Carpal tunnel syndrome of right wrist    RECURRENT   Depression    Dysphagia 09/02/2017   Edema 09/02/2017   Environmental allergies    dust mites, pollen, roaches and other insects, mold   Fibromyalgia 04/2016   Headache    migraines   Heart murmur    Hereditary and idiopathic peripheral neuropathy 04/03/2016   History of adenomatous polyp of colon    History of gastric ulcer    History of kidney stones    Hypertension    Pneumonia    Sleep apnea    wears CPAP   Wears glasses    Past Surgical History:  Procedure Laterality Date   CARPAL TUNNEL RELEASE Right 2007   CARPAL TUNNEL RELEASE Right 07/05/2015    Procedure: RIGHT HAND REVISION CARPAL TUNNEL RELEASE;  Surgeon: Iran Planas, MD;  Location: [facility];  Service: Orthopedics;  Laterality: Right;   CARPAL TUNNEL RELEASE Left 09/2016   CERVICAL DISC ARTHROPLASTY  03/2015   COLONOSCOPY W/ POLYPECTOMY  04/15/2014   CYSTO/  RIGHT RETROGRADE PYELOGRAM/ URETEROSCOPY STONE EXTRACTION/  STENT PLACEMENT  06/07/2010   FINGER ARTHRODESIS Right 08/10/2018   Procedure: RIGHT THUMB ARTHRODESIS;  Surgeon: Milly Jakob, MD;  Location: [facility];  Service: Orthopedics;  Laterality: Right;   LAPAROSCOPIC ASSISTED VAGINAL HYSTERECTOMY  04/25/2004   ovaries remain   LAPAROSCOPIC GASTRIC BAND REMOVAL WITH LAPAROSCOPIC GASTRIC SLEEVE RESECTION  10/26/2018   SPINAL CORD STIMULATOR IMPLANT  02/25/2018   [employer] Jude Model- [phone] [email or web address]   TUBAL LIGATION  1982   UPPER GASTROINTESTINAL ENDOSCOPY  12/08/2017   Family History  Problem Relation Age of Onset   Arthritis Mother    Hypertension Mother    Diabetes Mother    Cancer Mother    Stomach cancer Mother    Asthma Father    Diabetes Father    Diabetes Brother    Diabetes Brother    Cancer Maternal Aunt        breast   Cancer Maternal Uncle        lung   Colon cancer Maternal Uncle    Cancer Maternal Grandmother        ovarian   Cancer Maternal Grandfather        colon   Colon cancer Maternal Grandfather    Asthma Son    Alcohol abuse Son    Anxiety disorder Son    Schizophrenia Son    Diabetes Son    Esophageal cancer Neg Hx    Social History   Socioeconomic History   Marital status: Married    Spouse name: Not on file   Number of children: 2   Years of education: 2 yrs college   Highest education level: Not on file  Occupational History   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, broadcasting  Tobacco Use   Smoking status: Never    Passive exposure: Current   Smokeless tobacco: Never  Vaping Use   Vaping Use:  Never used  Substance and Sexual Activity   Alcohol use: Not Currently   Drug use: No   Sexual activity: Yes    Partners: Male    Birth control/protection: None    Comment: partial hysterectomy  Other Topics Concern   Not on file  Social History Narrative   Lives at home w/ her husband   Right-handed   Drinks 2 cups of coffee weekly    3 bottle water per day  Social Determinants of Health   Financial Resource Strain: Low Risk  (03/05/2023)   Overall Financial Resource Strain (CARDIA)    Difficulty of Paying Living Expenses: Not hard at all  Food Insecurity: No Food Insecurity (03/05/2023)   Hunger Vital Sign    Worried About Running Out of Food in the Last Year: Never true    Ran Out of Food in the Last Year: Never true  Transportation Needs: No Transportation Needs (03/05/2023)   PRAPARE - Hydrologist (Medical): No    Lack of Transportation (Non-Medical): No  Physical Activity: Inactive (03/05/2023)   Exercise Vital Sign    Days of Exercise per Week: 0 days    Minutes of Exercise per Session: 0 min  Stress: No Stress Concern Present (03/05/2023)   [place]    Feeling of Stress : Only a little  Social Connections: Socially Integrated (03/05/2023)   Social Connection and Isolation Panel [NHANES]    Frequency of Communication with Friends and Family: Three times a week    Frequency of Social Gatherings with Friends and Family: Three times a week    Attends Religious Services: More than 4 times per year    Active Member of Clubs or Organizations: Yes    Attends Music therapist: More than 4 times per year    Marital Status: Married    Tobacco Counseling Counseling given: Not Answered   Clinical Intake:  Pre-visit preparation completed: Yes  Pain : 0-10 Pain Score: 8  Pain Type: Chronic pain Pain Location: Leg Pain Orientation: Right Pain Descriptors /  Indicators: Aching, Burning, Dull, Constant Pain Onset: More than a month ago Pain Frequency: Intermittent Pain Relieving Factors: amitriptyline,gabapentin,celebrex,tramadol  Pain Relieving Factors: amitriptyline,gabapentin,celebrex,tramadol  Diabetes: No  How often do you need to have someone help you when you read instructions, pamphlets, or other written materials from your doctor or pharmacy?: 1 - Never  Diabetic? no  Interpreter Needed?: No  Information entered by :: Leroy Kennedy LPN   Activities of Daily Living    03/05/2023    3:39 PM  In your present state of health, do you have any difficulty performing the following activities:  Hearing? 1  Vision? 0  Difficulty concentrating or making decisions? 0  Walking or climbing stairs? 1  Dressing or bathing? 0  Doing errands, shopping? 0  Preparing Food and eating ? N  Using the Toilet? N  In the past six months, have you accidently leaked urine? N  Do you have problems with loss of bowel control? N  Managing your Medications? N  Managing your Finances? N  Housekeeping or managing your Housekeeping? N    Patient Care Team: Midge Minium, MD as PCP - General (Family Medicine) Iran Planas, MD as Consulting Physician (Orthopedic Surgery) Suella Broad, MD as Consulting Physician (Physical Medicine and Rehabilitation) Melina Schools, MD as Consulting Physician (Orthopedic Surgery) Milus Banister, MD as Attending Physician (Gastroenterology) Kathrynn Ducking, MD (Inactive) as Consulting Physician (Neurology) Ahmed Prima Neta Ehlers as Physician Assistant (Physician Assistant) Ward Givens, NP as Registered Nurse (Gerontology) Lorretta Harp, PA-C as Physician Assistant (Orthopedic Surgery) Madelin Rear, [facility] (Inactive) as Pharmacist (Pharmacist)  Indicate any recent Medical Services you may have received from other than [facility] providers in the past year (date may be approximate).     Assessment:   This is  a routine wellness examination for [place].  Hearing/Vision screen Hearing Screening -  Comments:: Mild hearing loss Right  Vision Screening - Comments:: [facility] Up to date  Dietary issues and exercise activities discussed: Current Exercise Habits: Home exercise routine, Exercise limited by: orthopedic condition(s)   Goals Addressed             This Visit's Progress    Patient Stated       Be off some medication for pain       Depression Screen    03/05/2023    3:45 PM 01/29/2023   11:17 AM 11/29/2022   10:26 AM 11/01/2022    4:02 PM 08/07/2022   10:49 AM 05/23/2022   10:42 AM 02/21/2022    2:21 PM  PHQ 2/9 Scores  PHQ - 2 Score 3 2 0 0 0 1 4  PHQ- 9 Score 6 16   8 5 14     Fall Risk    03/05/2023    3:36 PM 01/29/2023   11:17 AM 11/29/2022   10:26 AM 11/01/2022    4:01 PM 08/07/2022   10:49 AM  Fall Risk   Falls in the past year? 1 1 1 1 1   Number falls in past yr: 1 1 1 1 1   Injury with Fall? 1 1 1 1 1   Risk for fall due to : Impaired balance/gait;History of fall(s) History of fall(s)   History of fall(s)  Follow up Falls evaluation completed;Education provided Falls evaluation completed       FALL RISK PREVENTION PERTAINING TO THE HOME:  Any stairs in or around the home? Yes  If so, are there any without handrails? No  Home free of loose throw rugs in walkways, pet beds, electrical cords, etc? Yes  Adequate lighting in your home to reduce risk of falls? Yes   ASSISTIVE DEVICES UTILIZED TO PREVENT FALLS:  Life alert? Yes  Use of a cane, walker or w/c? Yes  Grab bars in the bathroom? Yes  Shower chair or bench in shower? Yes  Elevated toilet seat or a handicapped toilet? Yes   TIMED UP AND GO:  Was the test performed? No .    Cognitive Function:    02/24/2020    8:00 AM 02/01/2019    7:20 AM  MMSE - Mini Mental State Exam  Orientation to time 5 5  Orientation to Place 5 5  Registration 3 3  Attention/ Calculation 1 5  Recall 1 2  Language-  name 2 objects 2 2  Language- repeat 1 0  Language- follow 3 step command 3 3  Language- read & follow direction 1 1  Write a sentence 1 1  Copy design 1 1  Total score 24 28        03/05/2023    3:40 PM 02/05/2021   10:48 AM  6CIT Screen  What Year? 0 points 0 points  What month? 0 points 3 points  What time? 0 points 0 points  Count back from 20 2 points 0 points  Months in reverse 4 points 0 points  Repeat phrase 6 points 0 points  Total Score 12 points 3 points    Immunizations Immunization History  Administered Date(s) Administered   Influenza,inj,Quad PF,6+ Mos 09/15/2014, 10/24/2015, 09/08/2017, 01/29/2019   Influenza-Unspecified 12/25/2020, 10/12/2021, 10/14/2022   Moderna SARS-COV2 Booster Vaccination 10/12/2021   PFIZER(Purple Top)SARS-COV-2 Vaccination 02/29/2020, 03/21/2020, 10/10/2020   Pfizer Covid-19 Vaccine Bivalent Booster 24yrs & up 01/23/2022   Tdap 11/05/2017    TDAP status: Up to date  Flu Vaccine status: Up to  date  Pneumococcal vaccine status: Due, Education has been provided regarding the importance of this vaccine. Advised may receive this vaccine at local pharmacy or Health Dept. Aware to provide a copy of the vaccination record if obtained from local pharmacy or Health Dept. Verbalized acceptance and understanding.  Covid-19 vaccine status: Information provided on how to obtain vaccines.   Qualifies for Shingles Vaccine? No   Zostavax completed No   Shingrix Completed?: Yes  Screening Tests Health Maintenance  Topic Date Due   DEXA SCAN  04/21/2022   MAMMOGRAM  12/26/2022   Medicare Annual Wellness (AWV)  03/04/2024   DTaP/Tdap/Td (2 - Td or Tdap) 11/06/2027   COLONOSCOPY (Pts 45-75yrs Insurance coverage will need to be confirmed)  01/18/2029   INFLUENZA VACCINE  Completed   HIV Screening  Completed   HPV VACCINES  Aged Out   COVID-19 Vaccine  Discontinued   Hepatitis C Screening  Discontinued   Zoster Vaccines- Shingrix   Discontinued    Health Maintenance  Health Maintenance Due  Topic Date Due   DEXA SCAN  04/21/2022   MAMMOGRAM  12/26/2022    Colorectal cancer screening: Type of screening: Colonoscopy. Completed 2023. Repeat every 7 years  Mammogram status: Ordered  . Pt provided with contact info and advised to call to schedule appt.   Bone Density status: Completed 2021. Results reflect: Bone density results: OSTEOPOROSIS. Repeat every 5 years.  Lung Cancer Screening: (Low Dose CT Chest recommended if Age 12-80 years, 30 pack-year currently smoking OR have quit w/in 15years.) does not qualify.   Lung Cancer Screening Referral:   Additional Screening:  Hepatitis C Screening: does not qualify; Completed 2023  Vision Screening: Recommended annual ophthalmology exams for early detection of glaucoma and other disorders of the eye. Is the patient up to date with their annual eye exam?  Yes  Who is the provider or what is the name of the office in which the patient attends annual eye exams? [facility] If pt is not established with a provider, would they like to be referred to a provider to establish care? No .   Dental Screening: Recommended annual dental exams for proper oral hygiene  Community Resource Referral / Chronic Care Management: CRR required this visit?  No   CCM required this visit?  No      Plan:     I have personally reviewed and noted the following in the patient's chart:   Medical and social history Use of alcohol, tobacco or illicit drugs  Current medications and supplements including opioid prescriptions. Patient is not currently taking opioid prescriptions. Functional ability and status Nutritional status Physical activity Advanced directives List of other physicians Hospitalizations, surgeries, and ER visits in previous 12 months Vitals Screenings to include cognitive, depression, and falls Referrals and appointments  In addition, I have reviewed and  discussed with patient certain preventive protocols, quality metrics, and best practice recommendations. A written personalized care plan for preventive services as well as general preventive health recommendations were provided to patient.     Leroy Kennedy, LPN   [id number]   Nurse Notes: Patient states she thinks she had shingles at Pharmacy she is going over there and will bring in documentation at her upcoming appointment

## 2023-03-05 NOTE — Patient Instructions (Signed)
[name] , Thank you for taking time to come for your Medicare Wellness Visit. I appreciate your ongoing commitment to your health goals. Please review the following plan we discussed and let me know if I can assist you in the future.   Screening recommendations/referrals: Colonoscopy: up to date Recommended yearly ophthalmology/optometry visit for glaucoma screening and checkup Recommended yearly dental visit for hygiene and checkup  Vaccinations: Influenza vaccine: up to date Pneumococcal vaccine: education provided Tdap vaccine: up to date Shingles vaccine: up to date    Advanced directives: education provided  Conditions/risks identified:     Preventive Care 40-64 Years, Female Preventive care refers to lifestyle choices and visits with your health care provider that can promote health and wellness. What does preventive care include? A yearly physical exam. This is also called an annual well check. Dental exams once or twice a year. Routine eye exams. Ask your health care provider how often you should have your eyes checked. Personal lifestyle choices, including: Daily care of your teeth and gums. Regular physical activity. Eating a healthy diet. Avoiding tobacco and drug use. Limiting alcohol use. Practicing safe sex. Taking low-dose aspirin every day starting at age 35. What happens during an annual well check? The services and screenings done by your health care provider during your annual well check will depend on your age, overall health, lifestyle risk factors, and family history of disease. Counseling  Your health care provider may ask you questions about your: Alcohol use. Tobacco use. Drug use. Emotional well-being. Home and relationship well-being. Sexual activity. Eating habits. Work and work Statistician. Screening  You may have the following tests or measurements: Height, weight, and BMI. Blood pressure. Lipid and cholesterol levels. These may be  checked every 5 years, or more frequently if you are over 4 years old. Skin check. Lung cancer screening. You may have this screening every year starting at age 39 if you have a 30-pack-year history of smoking and currently smoke or have quit within the past 15 years. Fecal occult blood test (FOBT) of the stool. You may have this test every year starting at age 57. Flexible sigmoidoscopy or colonoscopy. You may have a sigmoidoscopy every 5 years or a colonoscopy every 10 years starting at age 32. Prostate cancer screening. Recommendations will vary depending on your family history and other risks. Hepatitis C blood test. Hepatitis B blood test. Sexually transmitted disease (STD) testing. Diabetes screening. This is done by checking your blood sugar (glucose) after you have not eaten for a while (fasting). You may have this done every 1-3 years. Discuss your test results, treatment options, and if necessary, the need for more tests with your health care provider. Vaccines  Your health care provider may recommend certain vaccines, such as: Influenza vaccine. This is recommended every year. Tetanus, diphtheria, and acellular pertussis (Tdap, Td) vaccine. You may need a Td booster every 10 years. Zoster vaccine. You may need this after age 41. Pneumococcal 13-valent conjugate (PCV13) vaccine. You may need this if you have certain conditions and have not been vaccinated. Pneumococcal polysaccharide (PPSV23) vaccine. You may need one or two doses if you smoke cigarettes or if you have certain conditions. Talk to your health care provider about which screenings and vaccines you need and how often you need them. This information is not intended to replace advice given to you by your health care provider. Make sure you discuss any questions you have with your health care provider. Document Released: 12/29/2015 Document Revised:  08/21/2016 Document Reviewed: 10/03/2015 Elsevier Interactive Patient  Education  2017 [employer] Prevention in the Home Falls can cause injuries. They can happen to people of all ages. There are many things you can do to make your home safe and to help prevent falls. What can I do on the outside of my home? Regularly fix the edges of walkways and driveways and fix any cracks. Remove anything that might make you trip as you walk through a door, such as a raised step or threshold. Trim any bushes or trees on the path to your home. Use bright outdoor lighting. Clear any walking paths of anything that might make someone trip, such as rocks or tools. Regularly check to see if handrails are loose or broken. Make sure that both sides of any steps have handrails. Any raised decks and porches should have guardrails on the edges. Have any leaves, snow, or ice cleared regularly. Use sand or salt on walking paths during winter. Clean up any spills in your garage right away. This includes oil or grease spills. What can I do in the bathroom? Use night lights. Install grab bars by the toilet and in the tub and shower. Do not use towel bars as grab bars. Use non-skid mats or decals in the tub or shower. If you need to sit down in the shower, use a plastic, non-slip stool. Keep the floor dry. Clean up any water that spills on the floor as soon as it happens. Remove soap buildup in the tub or shower regularly. Attach bath mats securely with double-sided non-slip rug tape. Do not have throw rugs and other things on the floor that can make you trip. What can I do in the bedroom? Use night lights. Make sure that you have a light by your bed that is easy to reach. Do not use any sheets or blankets that are too big for your bed. They should not hang down onto the floor. Have a firm chair that has side arms. You can use this for support while you get dressed. Do not have throw rugs and other things on the floor that can make you trip. What can I do in the  kitchen? Clean up any spills right away. Avoid walking on wet floors. Keep items that you use a lot in easy-to-reach places. If you need to reach something above you, use a strong step stool that has a grab bar. Keep electrical cords out of the way. Do not use floor polish or wax that makes floors slippery. If you must use wax, use non-skid floor wax. Do not have throw rugs and other things on the floor that can make you trip. What can I do with my stairs? Do not leave any items on the stairs. Make sure that there are handrails on both sides of the stairs and use them. Fix handrails that are broken or loose. Make sure that handrails are as long as the stairways. Check any carpeting to make sure that it is firmly attached to the stairs. Fix any carpet that is loose or worn. Avoid having throw rugs at the top or bottom of the stairs. If you do have throw rugs, attach them to the floor with carpet tape. Make sure that you have a light switch at the top of the stairs and the bottom of the stairs. If you do not have them, ask someone to add them for you. What else can I do to help prevent falls? Wear shoes  that: Do not have high heels. Have rubber bottoms. Are comfortable and fit you well. Are closed at the toe. Do not wear sandals. If you use a stepladder: Make sure that it is fully opened. Do not climb a closed stepladder. Make sure that both sides of the stepladder are locked into place. Ask someone to hold it for you, if possible. Clearly mark and make sure that you can see: Any grab bars or handrails. First and last steps. Where the edge of each step is. Use tools that help you move around (mobility aids) if they are needed. These include: Canes. Walkers. Scooters. Crutches. Turn on the lights when you go into a dark area. Replace any light bulbs as soon as they burn out. Set up your furniture so you have a clear path. Avoid moving your furniture around. If any of your floors are  uneven, fix them. If there are any pets around you, be aware of where they are. Review your medicines with your doctor. Some medicines can make you feel dizzy. This can increase your chance of falling. Ask your doctor what other things that you can do to help prevent falls. This information is not intended to replace advice given to you by your health care provider. Make sure you discuss any questions you have with your health care provider. Document Released: 09/28/2009 Document Revised: 05/09/2016 Document Reviewed: 01/06/2015 Elsevier Interactive Patient Education  2017 [employer].

## 2023-03-06 ENCOUNTER — Ambulatory Visit (INDEPENDENT_AMBULATORY_CARE_PROVIDER_SITE_OTHER): Payer: Medicare PPO | Admitting: Family Medicine

## 2023-03-06 ENCOUNTER — Encounter: Payer: Self-pay | Admitting: Family Medicine

## 2023-03-06 VITALS — BP 130/74 | HR 82 | Temp 98.4°F | Resp 17 | Ht 66.5 in | Wt 233.5 lb

## 2023-03-06 DIAGNOSIS — M160 Bilateral primary osteoarthritis of hip: Secondary | ICD-10-CM | POA: Diagnosis not present

## 2023-03-06 DIAGNOSIS — E559 Vitamin D deficiency, unspecified: Secondary | ICD-10-CM

## 2023-03-06 DIAGNOSIS — Z0000 Encounter for general adult medical examination without abnormal findings: Secondary | ICD-10-CM

## 2023-03-06 DIAGNOSIS — E669 Obesity, unspecified: Secondary | ICD-10-CM

## 2023-03-06 MED ORDER — CELECOXIB 200 MG PO CAPS: ORAL_CAPSULE | ORAL | 2 refills | Status: DC

## 2023-03-06 MED ORDER — DICLOFENAC SODIUM 1 % EX GEL: 4.0000 g | Freq: Four times a day (QID) | CUTANEOUS | 3 refills | Status: DC

## 2023-03-06 NOTE — Progress Notes (Signed)
Subjective:    Patient ID: Linda Buckley, female    DOB: 05-01-1956, 67 y.o.   MRN: [id number]  HPI CPE- UTD on Tdap, flu, colonoscopy.  Mammogram and DEXA ordered.  Patient Care Team    Relationship Specialty Notifications Start End  Midge Minium, MD PCP - General Family Medicine  01/12/14   Iran Planas, MD Consulting Physician Orthopedic Surgery  10/23/15   Suella Broad, MD Consulting Physician Physical Medicine and Rehabilitation  10/23/15   Melina Schools, MD Consulting Physician Orthopedic Surgery  04/03/16   Milus Banister, MD Attending Physician Gastroenterology  11/05/17   Kathrynn Ducking, MD (Inactive) Consulting Physician Neurology  11/05/17   Erma Heritage, PA-C Physician Assistant Physician Assistant  11/05/17   Ward Givens, NP Registered Nurse Gerontology  11/05/17   Lorretta Harp, PA-C Physician Assistant Orthopedic Surgery  12/01/17   Madelin Rear, [facility] (Inactive) Pharmacist Pharmacist  04/14/20    Comment: PHONE NUMBER [phone]     Health Maintenance  Topic Date Due   DEXA SCAN  04/21/2022   MAMMOGRAM  12/26/2022   Medicare Annual Wellness (AWV)  03/04/2024   DTaP/Tdap/Td (2 - Td or Tdap) 11/06/2027   COLONOSCOPY (Pts 45-22yrs Insurance coverage will need to be confirmed)  01/18/2029   INFLUENZA VACCINE  Completed   HIV Screening  Completed   HPV VACCINES  Aged Out   COVID-19 Vaccine  Discontinued   Hepatitis C Screening  Discontinued   Zoster Vaccines- Shingrix  Discontinued      Review of Systems Patient reports no vision changes, adenopathy,fever, weight change,  persistant/recurrent hoarseness , swallowing issues, chest pain, palpitations, edema, persistant/recurrent cough, hemoptysis, dyspnea (rest/exertional/paroxysmal nocturnal), gastrointestinal bleeding (melena, rectal bleeding), abdominal pain, significant heartburn, bowel changes, GU symptoms (dysuria, hematuria, incontinence), Gyn symptoms (abnormal  bleeding, pain),   syncope, focal weakness, memory loss, numbness & tingling, skin/nail changes, abnormal bruising or bleeding, anxiety, or depression.   + decreased hearing on R + hair loss    Objective:   Physical Exam General Appearance:    Alert, cooperative, no distress, appears stated age, obese  Head:    Normocephalic, without obvious abnormality, atraumatic  Eyes:    PERRL, conjunctiva/corneas clear, EOM's intact both eyes  Ears:    Normal TM's and external ear canals, both ears  Nose:   Nares normal, septum midline, mucosa normal, no drainage    or sinus tenderness  Throat:   Lips, mucosa, and tongue normal; teeth and gums normal  Neck:   Supple, symmetrical, trachea midline, no adenopathy;    Thyroid: no enlargement/tenderness/nodules  Back:     Symmetric, no curvature, ROM normal, no CVA tenderness  Lungs:     Clear to auscultation bilaterally, respirations unlabored  Chest Wall:    No tenderness or deformity   Heart:    Regular rate and rhythm, S1 and S2 normal, no murmur, rub   or gallop  Breast Exam:    Deferred to mammo  Abdomen:     Soft, non-tender, bowel sounds active all four quadrants,    no masses, no organomegaly  Genitalia:    Deferred to GYN  Rectal:    Extremities:   Extremities normal, atraumatic, no cyanosis or edema  Pulses:   2+ and symmetric all extremities  Skin:   Skin color, texture, turgor normal, no rashes or lesions  Lymph nodes:   Cervical, supraclavicular, and axillary nodes normal  Neurologic:   CNII-XII intact, normal strength, sensation and reflexes  throughout          Assessment & Plan:

## 2023-03-06 NOTE — Assessment & Plan Note (Signed)
Pt's PE WNL w/ exception of BMI.  UTD on colonoscopy, immunizations.  Due for mammo and DEXA- orders already entered.  Check labs.  Anticipatory guidance provided.

## 2023-03-06 NOTE — Assessment & Plan Note (Signed)
Ongoing issue for pt.  Weight is stable and BMI 37.12  Check labs to risk stratify.  Encouraged small but frequent meals as pt tends to only eat once daily and then will eat 'all the wrong things'.  Will follow.

## 2023-03-06 NOTE — Patient Instructions (Signed)
Follow up in 6 months to recheck weight loss efforts We'll notify you of your lab results and make any changes if needed Continue to work on healthy diet and regular exercise- you can do it!! They should call you to schedule your mammo and bone density Call with any questions or concerns Stay Safe!  Stay Healthy! Happy Spring!!!

## 2023-03-06 NOTE — Assessment & Plan Note (Signed)
Check labs and replete prn. 

## 2023-03-07 ENCOUNTER — Other Ambulatory Visit (INDEPENDENT_AMBULATORY_CARE_PROVIDER_SITE_OTHER): Payer: Medicare PPO

## 2023-03-07 ENCOUNTER — Telehealth: Payer: Self-pay

## 2023-03-07 ENCOUNTER — Other Ambulatory Visit: Payer: Self-pay

## 2023-03-07 DIAGNOSIS — R7309 Other abnormal glucose: Secondary | ICD-10-CM | POA: Diagnosis not present

## 2023-03-07 LAB — HEMOGLOBIN A1C: Hgb A1c MFr Bld: 5.9 % (ref 4.6–6.5)

## 2023-03-07 LAB — VITAMIN D 25 HYDROXY (VIT D DEFICIENCY, FRACTURES): VITD: 37.87 ng/mL (ref 30.00–100.00)

## 2023-03-07 LAB — HEPATIC FUNCTION PANEL
ALT: 13 U/L (ref 0–35)
AST: 16 U/L (ref 0–37)
Albumin: 4 g/dL (ref 3.5–5.2)
Alkaline Phosphatase: 74 U/L (ref 39–117)
Bilirubin, Direct: 0 mg/dL (ref 0.0–0.3)
Total Bilirubin: 0.3 mg/dL (ref 0.2–1.2)
Total Protein: 7 g/dL (ref 6.0–8.3)

## 2023-03-07 LAB — CBC WITH DIFFERENTIAL/PLATELET
Basophils Absolute: 0 10*3/uL (ref 0.0–0.1)
Basophils Relative: 0.6 % (ref 0.0–3.0)
Eosinophils Absolute: 0.1 10*3/uL (ref 0.0–0.7)
Eosinophils Relative: 3.2 % (ref 0.0–5.0)
HCT: 39.5 % (ref 36.0–46.0)
Hemoglobin: 12.9 g/dL (ref 12.0–15.0)
Lymphocytes Relative: 23.2 % (ref 12.0–46.0)
Lymphs Abs: 0.9 10*3/uL (ref 0.7–4.0)
MCHC: 32.7 g/dL (ref 30.0–36.0)
MCV: 91.9 fl (ref 78.0–100.0)
Monocytes Absolute: 0.3 10*3/uL (ref 0.1–1.0)
Monocytes Relative: 8.6 % (ref 3.0–12.0)
Neutro Abs: 2.6 10*3/uL (ref 1.4–7.7)
Neutrophils Relative %: 64.4 % (ref 43.0–77.0)
Platelets: 247 10*3/uL (ref 150.0–400.0)
RBC: 4.3 Mil/uL (ref 3.87–5.11)
RDW: 13.8 % (ref 11.5–15.5)
WBC: 4 10*3/uL (ref 4.0–10.5)

## 2023-03-07 LAB — BASIC METABOLIC PANEL
BUN: 20 mg/dL (ref 6–23)
CO2: 27 mEq/L (ref 19–32)
Calcium: 9.4 mg/dL (ref 8.4–10.5)
Chloride: 108 mEq/L (ref 96–112)
Creatinine, Ser: 1 mg/dL (ref 0.40–1.20)
GFR: 59.58 mL/min — ABNORMAL LOW (ref >60.00)
Glucose, Bld: 115 mg/dL — ABNORMAL HIGH (ref 70–99)
Potassium: 4.7 mEq/L (ref 3.5–5.1)
Sodium: 143 mEq/L (ref 135–145)

## 2023-03-07 LAB — TSH: TSH: 0.65 u[IU]/mL (ref 0.35–5.50)

## 2023-03-07 LAB — LIPID PANEL
Cholesterol: 189 mg/dL (ref 0–200)
HDL: 71.8 mg/dL (ref >39.00)
LDL Cholesterol: 104 mg/dL — ABNORMAL HIGH (ref 0–99)
NonHDL: 116.8
Total CHOL/HDL Ratio: 3
Triglycerides: 65 mg/dL (ref 0.0–149.0)
VLDL: 13 mg/dL (ref 0.0–40.0)

## 2023-03-07 NOTE — Telephone Encounter (Signed)
Informed pt of lab results  

## 2023-03-07 NOTE — Telephone Encounter (Signed)
-----   Message from Midge Minium, MD sent at 03/07/2023 12:35 PM EDT ----- Labs look good w/ exception of elevated sugar.  We will add an A1C to assess for possible diabetes.  Continue to work on low carb, healthy diet and regular physical activity as you are able.

## 2023-03-07 NOTE — Telephone Encounter (Signed)
Informed pt  of  lab results . Add on A1C has been faxed to lab and pt was asking for a refill on Meloxcaim but she had the Celebrex refilled  yesterday . Dr Birdie Riddle states pt can not take both due to being ant inflammatory's she advised that when the pt is finished with the Celebrex she can then call and we can refill Mobic

## 2023-03-07 NOTE — Telephone Encounter (Signed)
-----   Message from Midge Minium, MD sent at 03/07/2023  3:35 PM EDT ----- No evidence of diabetes- great news!

## 2023-03-27 DIAGNOSIS — G4733 Obstructive sleep apnea (adult) (pediatric): Secondary | ICD-10-CM | POA: Diagnosis not present

## 2023-03-31 ENCOUNTER — Other Ambulatory Visit: Payer: Self-pay | Admitting: Sports Medicine

## 2023-03-31 DIAGNOSIS — M797 Fibromyalgia: Secondary | ICD-10-CM

## 2023-03-31 DIAGNOSIS — M1711 Unilateral primary osteoarthritis, right knee: Secondary | ICD-10-CM

## 2023-04-08 ENCOUNTER — Ambulatory Visit (INDEPENDENT_AMBULATORY_CARE_PROVIDER_SITE_OTHER): Payer: Medicare PPO | Admitting: Sports Medicine

## 2023-04-08 DIAGNOSIS — M1711 Unilateral primary osteoarthritis, right knee: Secondary | ICD-10-CM | POA: Diagnosis not present

## 2023-04-08 DIAGNOSIS — M47816 Spondylosis without myelopathy or radiculopathy, lumbar region: Secondary | ICD-10-CM | POA: Diagnosis not present

## 2023-04-08 DIAGNOSIS — M797 Fibromyalgia: Secondary | ICD-10-CM | POA: Diagnosis not present

## 2023-04-08 MED ORDER — TRAMADOL HCL 50 MG PO TABS: 100.0000 mg | ORAL_TABLET | Freq: Two times a day (BID) | ORAL | 3 refills | Status: DC | PRN

## 2023-04-08 MED ORDER — MELOXICAM 15 MG PO TABS: ORAL_TABLET | ORAL | 3 refills | Status: DC

## 2023-04-08 NOTE — Progress Notes (Signed)
    Procedures performed today:    None.  Independent interpretation of notes and tests performed by another provider:   None.  Brief History, Exam, Impression, and Recommendations:    Lumbar spondylosis Linda Buckley has increasing pain that she localizes right hip anteriorly with radiation down lateral to the knee, lateral lower leg to the top of the right foot. Historically we have been treating her for hip osteoarthritis, today her pain is not reproducible with internal rotation of the hip, I do think she is having more radicular symptoms, MRI was from 2022, reviewed, multilevel spondylosis noted but no obvious areas of neuroforaminal stenosis. Due to multilevel spondylitic changes we will proceed with a right L5-S1 interlaminar epidural, I will also increase her tramadol to 100 mg twice daily and refill her meloxicam, return to see me 6 weeks after the epidural. Of note she did have some facet joint blocks with Dr. Laurian Brim in the past.    ____________________________________________ Ihor Austin. Benjamin Stain, M.D., ABFM., CAQSM., AME. Primary Care and Sports Medicine [facility]  Adjunct Professor of Family Medicine  [place] [employer]  Restaurant manager, fast food

## 2023-04-08 NOTE — Assessment & Plan Note (Signed)
Linda Buckley has increasing pain that she localizes right hip anteriorly with radiation down lateral to the knee, lateral lower leg to the top of the right foot. Historically we have been treating her for hip osteoarthritis, today her pain is not reproducible with internal rotation of the hip, I do think she is having more radicular symptoms, MRI was from 2022, reviewed, multilevel spondylosis noted but no obvious areas of neuroforaminal stenosis. Due to multilevel spondylitic changes we will proceed with a right L5-S1 interlaminar epidural, I will also increase her tramadol to 100 mg twice daily and refill her meloxicam, return to see me 6 weeks after the epidural. Of note she did have some facet joint blocks with Dr. Laurian Brim in the past.

## 2023-04-17 NOTE — Discharge Instructions (Signed)

## 2023-04-18 ENCOUNTER — Ambulatory Visit
Admission: RE | Admit: 2023-04-18 | Discharge: 2023-04-18 | Disposition: A | Discharge status: Home / Self Care | Payer: Medicare PPO | Source: Ambulatory Visit | Attending: Sports Medicine | Admitting: Sports Medicine

## 2023-04-18 DIAGNOSIS — M25551 Pain in right hip: Secondary | ICD-10-CM | POA: Diagnosis not present

## 2023-04-18 DIAGNOSIS — M47816 Spondylosis without myelopathy or radiculopathy, lumbar region: Secondary | ICD-10-CM

## 2023-04-18 DIAGNOSIS — M79604 Pain in right leg: Secondary | ICD-10-CM | POA: Diagnosis not present

## 2023-04-18 DIAGNOSIS — M47817 Spondylosis without myelopathy or radiculopathy, lumbosacral region: Secondary | ICD-10-CM | POA: Diagnosis not present

## 2023-04-18 DIAGNOSIS — M545 Low back pain, unspecified: Secondary | ICD-10-CM | POA: Diagnosis not present

## 2023-04-18 MED ORDER — IOPAMIDOL (ISOVUE-M 200) INJECTION 41%
1.0000 mL | Freq: Once | INTRAMUSCULAR | Status: AC
  Administered 2023-04-18: 1 mL via EPIDURAL

## 2023-04-18 MED ORDER — METHYLPREDNISOLONE ACETATE 40 MG/ML INJ SUSP (RADIOLOG
80.0000 mg | Freq: Once | INTRAMUSCULAR | Status: AC
  Administered 2023-04-18: 80 mg via EPIDURAL

## 2023-04-24 ENCOUNTER — Ambulatory Visit
Admission: RE | Admit: 2023-04-24 | Discharge: 2023-04-24 | Disposition: A | Discharge status: Home / Self Care | Payer: Medicare PPO | Source: Ambulatory Visit | Attending: Family Medicine | Admitting: Family Medicine

## 2023-04-24 DIAGNOSIS — Z1231 Encounter for screening mammogram for malignant neoplasm of breast: Secondary | ICD-10-CM | POA: Diagnosis not present

## 2023-04-29 ENCOUNTER — Telehealth: Payer: Self-pay

## 2023-04-29 NOTE — Telephone Encounter (Signed)
[facility] on [address] [place]  Patient called in asking for something for nerves states she lost her son on Sunday and she is not doing well.

## 2023-04-30 ENCOUNTER — Other Ambulatory Visit: Payer: Self-pay

## 2023-04-30 NOTE — Telephone Encounter (Signed)
Left the directions from Dr Beverely Low on pt VM

## 2023-04-30 NOTE — Telephone Encounter (Signed)
I am so sorry to hear about her loss.  She should take the Clonazepam that she has prescribed.  She can take 1 during the day and 1 before bed if needed to help w/ anxiety.

## 2023-05-23 ENCOUNTER — Ambulatory Visit (INDEPENDENT_AMBULATORY_CARE_PROVIDER_SITE_OTHER): Payer: Medicare PPO

## 2023-05-23 ENCOUNTER — Ambulatory Visit (INDEPENDENT_AMBULATORY_CARE_PROVIDER_SITE_OTHER): Payer: Medicare PPO | Admitting: Sports Medicine

## 2023-05-23 DIAGNOSIS — M797 Fibromyalgia: Secondary | ICD-10-CM | POA: Diagnosis not present

## 2023-05-23 DIAGNOSIS — M160 Bilateral primary osteoarthritis of hip: Secondary | ICD-10-CM

## 2023-05-23 DIAGNOSIS — F4321 Adjustment disorder with depressed mood: Secondary | ICD-10-CM | POA: Diagnosis not present

## 2023-05-23 DIAGNOSIS — M1711 Unilateral primary osteoarthritis, right knee: Secondary | ICD-10-CM

## 2023-05-23 DIAGNOSIS — M25522 Pain in left elbow: Secondary | ICD-10-CM | POA: Diagnosis not present

## 2023-05-23 DIAGNOSIS — M47816 Spondylosis without myelopathy or radiculopathy, lumbar region: Secondary | ICD-10-CM

## 2023-05-23 DIAGNOSIS — M7702 Medial epicondylitis, left elbow: Secondary | ICD-10-CM

## 2023-05-23 HISTORY — DX: Adjustment disorder with depressed mood: F43.21

## 2023-05-23 MED ORDER — TRAMADOL HCL 50 MG PO TABS
100.0000 mg | ORAL_TABLET | Freq: Three times a day (TID) | ORAL | 3 refills | Status: DC
Start: 2023-05-23 — End: 2024-01-08

## 2023-05-23 MED ORDER — MELOXICAM 15 MG PO TABS
15.0000 mg | ORAL_TABLET | Freq: Every day | ORAL | 11 refills | Status: AC
Start: 2023-05-23 — End: ?

## 2023-05-23 NOTE — Assessment & Plan Note (Signed)
We have been really trying to find her pain generator, she localizes the pain anteriorly in the hip but also in the buttock and down to the knee, we did try a right L5-S1 interlaminar epidural based on an MRI from 2022, she had no relief, not even temporary.

## 2023-05-23 NOTE — Progress Notes (Signed)
    Procedures performed today:    None.  Independent interpretation of notes and tests performed by another provider:   None.  Brief History, Exam, Impression, and Recommendations:    Primary osteoarthritis of both hips This is a very pleasant 67 year old female returns, she has known hip osteoarthritis, she has had trochanteric bursa injections, she had a hip joint injection several months ago with some relief, hip MRI did show hip osteoarthritis but no evidence of bursitis or hip abductor tendinitis, her meloxicam was stopped due to LFTs but I explained that she could restart it as this was cleared typically through the kidneys. We will restart meloxicam, refill tramadol. I want her to do some formal physical therapy. She does not want an additional hip joint injection today. Of note she did not get relief from a lumbar epidural recently.  Lumbar spondylosis We have been really trying to find her pain generator, she localizes the pain anteriorly in the hip but also in the buttock and down to the knee, we did try a right L5-S1 interlaminar epidural based on an MRI from 2022, she had no relief, not even temporary.   Grieving Yovanna recently buried her son, she is having expected severe grieving, I did explain to her the importance of grief counseling, we will set her up, I also explained her to the importance of treating psychological pain and the difficulty controlling musculoskeletal pain in the presence of psychological pain.  Medial epicondylitis, left Present for several months, adding x-rays, formal PT, return to see me in 6 weeks, injection if not better.  I spent 40 minutes of total time managing this patient today, this includes chart review, face to face, and non-face to face time.  ____________________________________________ Ihor Austin. Benjamin Stain, M.D., ABFM., CAQSM., AME. Primary Care and Sports Medicine [facility]  Adjunct Professor of Family  Medicine  [place] [employer] [facility] [employer] [facility] [employer]  Restaurant manager, fast food

## 2023-05-23 NOTE — Assessment & Plan Note (Signed)
Present for several months, adding x-rays, formal PT, return to see me in 6 weeks, injection if not better.

## 2023-05-23 NOTE — Assessment & Plan Note (Signed)
Linda Buckley recently buried her son, she is having expected severe grieving, I did explain to her the importance of grief counseling, we will set her up, I also explained her to the importance of treating psychological pain and the difficulty controlling musculoskeletal pain in the presence of psychological pain.

## 2023-05-23 NOTE — Assessment & Plan Note (Addendum)
This is a very pleasant 67 year old female returns, she has known hip osteoarthritis, she has had trochanteric bursa injections, she had a hip joint injection several months ago with some relief, hip MRI did show hip osteoarthritis but no evidence of bursitis or hip abductor tendinitis, her meloxicam was stopped due to LFTs but I explained that she could restart it as this was cleared typically through the kidneys. We will restart meloxicam, refill tramadol. I want her to do some formal physical therapy. She does not want an additional hip joint injection today. Of note she did not get relief from a lumbar epidural recently.

## 2023-05-26 ENCOUNTER — Other Ambulatory Visit: Payer: Self-pay | Admitting: Sports Medicine

## 2023-05-26 DIAGNOSIS — G609 Hereditary and idiopathic neuropathy, unspecified: Secondary | ICD-10-CM

## 2023-05-28 ENCOUNTER — Ambulatory Visit (INDEPENDENT_AMBULATORY_CARE_PROVIDER_SITE_OTHER): Payer: Medicare PPO | Admitting: Family Medicine

## 2023-05-28 ENCOUNTER — Encounter: Payer: Self-pay | Admitting: Family Medicine

## 2023-05-28 ENCOUNTER — Telehealth: Payer: Self-pay

## 2023-05-28 VITALS — BP 132/80 | HR 72 | Temp 97.8°F | Resp 17 | Ht 66.5 in | Wt 233.5 lb

## 2023-05-28 DIAGNOSIS — R5383 Other fatigue: Secondary | ICD-10-CM

## 2023-05-28 DIAGNOSIS — G43909 Migraine, unspecified, not intractable, without status migrainosus: Secondary | ICD-10-CM | POA: Diagnosis not present

## 2023-05-28 DIAGNOSIS — F4321 Adjustment disorder with depressed mood: Secondary | ICD-10-CM | POA: Diagnosis not present

## 2023-05-28 DIAGNOSIS — F411 Generalized anxiety disorder: Secondary | ICD-10-CM

## 2023-05-28 DIAGNOSIS — F331 Major depressive disorder, recurrent, moderate: Secondary | ICD-10-CM | POA: Diagnosis not present

## 2023-05-28 LAB — TSH: TSH: 0.33 u[IU]/mL — ABNORMAL LOW (ref 0.35–5.50)

## 2023-05-28 LAB — CBC WITH DIFFERENTIAL/PLATELET
Basophils Absolute: 0 10*3/uL (ref 0.0–0.1)
Basophils Relative: 0.6 % (ref 0.0–3.0)
Eosinophils Absolute: 0.1 10*3/uL (ref 0.0–0.7)
Eosinophils Relative: 2.7 % (ref 0.0–5.0)
HCT: 41.8 % (ref 36.0–46.0)
Hemoglobin: 13.2 g/dL (ref 12.0–15.0)
Lymphocytes Relative: 22.3 % (ref 12.0–46.0)
Lymphs Abs: 0.7 10*3/uL (ref 0.7–4.0)
MCHC: 31.6 g/dL (ref 30.0–36.0)
MCV: 92.1 fl (ref 78.0–100.0)
Monocytes Absolute: 0.3 10*3/uL (ref 0.1–1.0)
Monocytes Relative: 9.1 % (ref 3.0–12.0)
Neutro Abs: 2 10*3/uL (ref 1.4–7.7)
Neutrophils Relative %: 65.3 % (ref 43.0–77.0)
Platelets: 260 10*3/uL (ref 150.0–400.0)
RBC: 4.54 Mil/uL (ref 3.87–5.11)
RDW: 12.9 % (ref 11.5–15.5)
WBC: 3 10*3/uL — ABNORMAL LOW (ref 4.0–10.5)

## 2023-05-28 LAB — HEPATIC FUNCTION PANEL
ALT: 13 U/L (ref 0–35)
AST: 17 U/L (ref 0–37)
Albumin: 4 g/dL (ref 3.5–5.2)
Alkaline Phosphatase: 67 U/L (ref 39–117)
Bilirubin, Direct: 0.1 mg/dL (ref 0.0–0.3)
Total Bilirubin: 0.4 mg/dL (ref 0.2–1.2)
Total Protein: 7.3 g/dL (ref 6.0–8.3)

## 2023-05-28 LAB — B12 AND FOLATE PANEL
Folate: >23.8 ng/mL (ref >5.9)
Vitamin B-12: 506 pg/mL (ref 211–911)

## 2023-05-28 LAB — BASIC METABOLIC PANEL
BUN: 9 mg/dL (ref 6–23)
CO2: 28 mEq/L (ref 19–32)
Calcium: 9.8 mg/dL (ref 8.4–10.5)
Chloride: 105 mEq/L (ref 96–112)
Creatinine, Ser: 0.93 mg/dL (ref 0.40–1.20)
GFR: 64.9 mL/min (ref >60.00)
Glucose, Bld: 80 mg/dL (ref 70–99)
Potassium: 3.8 mEq/L (ref 3.5–5.1)
Sodium: 142 mEq/L (ref 135–145)

## 2023-05-28 LAB — VITAMIN D 25 HYDROXY (VIT D DEFICIENCY, FRACTURES): VITD: 38.6 ng/mL (ref 30.00–100.00)

## 2023-05-28 MED ORDER — BUSPIRONE HCL 30 MG PO TABS
30.0000 mg | ORAL_TABLET | Freq: Two times a day (BID) | ORAL | 3 refills | Status: DC
Start: 2023-05-28 — End: 2024-03-23

## 2023-05-28 MED ORDER — UBRELVY 50 MG PO TABS: 1.0000 | ORAL_TABLET | Freq: Every day | ORAL | 3 refills | Status: DC | PRN

## 2023-05-28 MED ORDER — BUPROPION HCL ER (XL) 300 MG PO TB24: 300.0000 mg | ORAL_TABLET | Freq: Every morning | ORAL | 3 refills | Status: DC

## 2023-05-28 MED ORDER — BUPROPION HCL ER (XL) 150 MG PO TB24: ORAL_TABLET | ORAL | 3 refills | Status: DC

## 2023-05-28 NOTE — Telephone Encounter (Signed)
Pharmacy needed a PA for pt Rx Ubrelvy . I obtained the PA thru Cover my meds . It was approved till 12/16/23 . Pt is aware to call pharmacy and fill .

## 2023-05-28 NOTE — Progress Notes (Signed)
   Subjective:    Patient ID: Linda Buckley, female    DOB: 1956/06/08, 67 y.o.   MRN: [id number]  HPI HA- pt reports HA, nausea.  HA's are L sided.  Occurring nearly every day.  Pt lost her son 30 days ago and HA's have returned since then.  Pt is tearful, anxious, overwhelmed.  Pt is going to start grief counseling.  Decreased appetite.  + fatigue.  Pt doesn't have photo or phonophobia.  Some improvement w/ tylenol and rest.  Nothing worsens HA.  Pt reports that this feels similar to previous migraines.  + fatigue.   Review of Systems For ROS see HPI     Objective:   Physical Exam Vitals reviewed.  Constitutional:      General: She is not in acute distress.    Appearance: Normal appearance. She is well-developed. She is not ill-appearing.  HENT:     Head: Normocephalic and atraumatic.     Right Ear: Tympanic membrane and ear canal normal.     Left Ear: Tympanic membrane and ear canal normal.  Eyes:     Extraocular Movements: Extraocular movements intact.     Right eye: No nystagmus.     Left eye: No nystagmus.     Conjunctiva/sclera: Conjunctivae normal.     Pupils: Pupils are equal, round, and reactive to light.  Cardiovascular:     Rate and Rhythm: Normal rate and regular rhythm.     Heart sounds: Normal heart sounds.  Pulmonary:     Effort: Pulmonary effort is normal. No respiratory distress.     Breath sounds: Normal breath sounds. No wheezing or rales.  Musculoskeletal:        General: No tenderness.     Cervical back: Normal range of motion and neck supple.  Lymphadenopathy:     Cervical: No cervical adenopathy.  Skin:    General: Skin is warm and dry.  Neurological:     General: No focal deficit present.     Mental Status: She is alert and oriented to person, place, and time.     Cranial Nerves: No cranial nerve deficit, dysarthria or facial asymmetry.     Coordination: Coordination normal.     Deep Tendon Reflexes: Reflexes are normal and symmetric. Reflexes  normal.  Psychiatric:        Mood and Affect: Mood is anxious (tearful) and depressed.        Behavior: Behavior normal.        Thought Content: Thought content normal.        Judgment: Judgment normal.           Assessment & Plan:

## 2023-05-28 NOTE — Patient Instructions (Signed)
Follow up in 4-6 weeks to recheck mood We'll notify you of your lab results and make any changes if needed Take Tylenol at the first sign of headache If no improvement after 15-20 minutes, take the migraine medication [name]) Drink plenty of fluids Allow yourself time to grieve! Increase the Buspar (Buspirone) to 30mg  twice daily Call with any questions or concerns Hang in there!!

## 2023-05-29 ENCOUNTER — Telehealth: Payer: Self-pay

## 2023-05-29 NOTE — Telephone Encounter (Signed)
-----   Message from Sheliah Hatch, MD sent at 05/29/2023  7:22 AM EDT ----- Labs look good w/ exception of just slightly low TSH.  At this level, this is no cause for concern and would not account for your fatigue.  We will continue to follow this at future visits.

## 2023-05-29 NOTE — Telephone Encounter (Signed)
Pt has been notified.

## 2023-05-29 NOTE — Assessment & Plan Note (Signed)
Deteriorated.  Pt lost her son 30 days ago- on Mother's Day.  Since then she has been tearful, overwhelmed, depressed, and anxious.  She is already on max dose of Wellbutrin and Cymbalta.  Will increase Bupar to 30mg  BID.  Encouraged her to seek grief counseling.  Discussed Hospice as an option.  Pt to look into it.  Will follow.

## 2023-05-29 NOTE — Assessment & Plan Note (Signed)
Deteriorated.  Pt reports she had not had headaches- or specifically migraines- in quite some time.  Since her son's passing, she has had nearly daily HA's- typically on the L side, associated w/ nausea.  She states these feel similar to her previous migraines.  Due to the fact she is on multiple medications that impact serotonin levels, will try and get Linda Buckley approved rather than a triptan to avoid serotonin syndrome.  Pt expressed understanding and is in agreement w/ plan.

## 2023-05-29 NOTE — Telephone Encounter (Signed)
Left vm for pt to call office about labs. 

## 2023-05-29 NOTE — Assessment & Plan Note (Signed)
New to provider.  Pt lost her son 30 days ago on Mother's Day.  She is having a very difficult time.  She is on medication and we will increase her Buspar to 30mg  BID.  Encouraged grief counseling.  Will follow.

## 2023-05-29 NOTE — Assessment & Plan Note (Signed)
Pt has hx of similar but this has recently worsened.  Suspect that this is emotional fatigue due to grief over the loss of her son.  Will check labs to r/o metabolic cause.  Pt expressed understanding and is in agreement w/ plan.

## 2023-06-13 NOTE — Progress Notes (Signed)
Office Visit Note  Patient: Linda Buckley             Date of Birth: 02/20/1956           MRN: [id number]             PCP: Sheliah Hatch, MD Referring: Sheliah Hatch, MD Visit Date: 06/27/2023 Occupation: @GUAROCC @  Subjective:  Pain in multiple joints  History of Present Illness: Linda Buckley is a 67 y.o. female with osteoarthritis, fibromyalgia and positive ANA.  She gives history of dry mouth, dry eyes, joint pain, morning stiffness.  There is no history of oral ulcers, nasal ulcers, malar rash, photosensitivity, Raynaud's or lymphadenopathy.  She had bilateral trochanteric bursa injections in January 2024 for trochanteric bursitis.  Patient states the cortisone shots lasted for short time.  She also went for physical therapy.  She has been going to water aerobics about 3 times a week.  She states she continues to have pain and discomfort in multiple joints.  She describes discomfort in her bilateral elbows, bilateral hands, right hip and her trochanteric region.  Her right knee joint is also painful.  She was evaluated by Dr. Karie Schwalbe.  Elbow joint x-rays were obtained which showed spurring consistent with degenerative changes.  She has been taking meloxicam 15 mg p.o. daily.  She states gabapentin was discontinued and tramadol was added 100 mg 3 times daily by Dr. Karie Schwalbe.  She has not noticed remarkable improvement in her pain.    Activities of Daily Living:  Patient reports morning stiffness for 10-15 minutes.   Patient Reports nocturnal pain.  Difficulty dressing/grooming: Reports Difficulty climbing stairs: Reports Difficulty getting out of chair: Reports Difficulty using hands for taps, buttons, cutlery, and/or writing: Reports  Review of Systems  Constitutional:  Positive for fatigue.  HENT:  Positive for mouth dryness. Negative for mouth sores.   Eyes:  Positive for dryness.  Respiratory:  Negative for difficulty breathing.   Cardiovascular:  Negative for chest pain  and palpitations.  Gastrointestinal:  Negative for blood in stool, constipation and diarrhea.  Endocrine: Negative for increased urination.  Genitourinary:  Negative for involuntary urination.  Musculoskeletal:  Positive for joint pain, gait problem, joint pain, joint swelling, myalgias, muscle weakness, morning stiffness, muscle tenderness and myalgias.  Skin:  Negative for color change, rash, hair loss and sensitivity to sunlight.  Allergic/Immunologic: Positive for susceptible to infections.  Neurological:  Positive for dizziness and headaches.  Hematological:  Negative for swollen glands.  Psychiatric/Behavioral:  Positive for depressed mood and sleep disturbance. The patient is nervous/anxious.     PMFS History:  Patient Active Problem List   Diagnosis Date Noted   Migraine headache 05/28/2023   Grieving 05/23/2023   Medial epicondylitis, left 05/23/2023   Elevated antinuclear antibody (ANA) level 05/26/2022   Easy bruising 05/26/2022   Right ankle pain 11/23/2021   Primary osteoarthritis of right knee 08/16/2021   Lumbar spondylosis 07/03/2021   Poor compliance with CPAP treatment 02/01/2020   OSA on CPAP 02/01/2020   Chronic left shoulder pain 09/17/2019   MDD (major depressive disorder), recurrent episode, moderate (HCC) 05/05/2018   Vitamin D deficiency 11/05/2017   Dysphagia 09/02/2017   Multiple falls 09/04/2016   Hereditary and idiopathic peripheral neuropathy 04/03/2016   Left leg pain 01/26/2016   Primary osteoarthritis of both hips 12/19/2015   Fibromyalgia 07/28/2015   Right wrist pain 04/05/2015   Physical exam 03/01/2014   HTN (hypertension) 01/12/2014   Obesity (BMI  30-39.9) 01/12/2014   Pre-diabetes 01/12/2014   Family history of lupus erythematosus 01/12/2014   Fatigue 10/17/2013    Past Medical History:  Diagnosis Date   Allergy    Anxiety    Arthritis    Carpal tunnel syndrome of right wrist    RECURRENT   Depression    Dysphagia 09/02/2017    Edema 09/02/2017   Environmental allergies    dust mites, pollen, roaches and other insects, mold   Fibromyalgia 04/2016   Headache    migraines   Heart murmur    Hereditary and idiopathic peripheral neuropathy 04/03/2016   History of adenomatous polyp of colon    History of gastric ulcer    History of kidney stones    Hypertension    Pneumonia    Sleep apnea    wears CPAP   Wears glasses     Family History  Problem Relation Age of Onset   Arthritis Mother    Hypertension Mother    Diabetes Mother    Cancer Mother    Stomach cancer Mother    Asthma Father    Diabetes Father    Diabetes Brother    Diabetes Brother    Cancer Maternal Aunt        breast   Cancer Maternal Uncle        lung   Colon cancer Maternal Uncle    Cancer Maternal Grandmother        ovarian   Cancer Maternal Grandfather        colon   Colon cancer Maternal Grandfather    Asthma Son    Alcohol abuse Son    Anxiety disorder Son    Schizophrenia Son    Diabetes Son    Esophageal cancer Neg Hx    Past Surgical History:  Procedure Laterality Date   CARPAL TUNNEL RELEASE Right 2007   CARPAL TUNNEL RELEASE Right 07/05/2015   Procedure: RIGHT HAND REVISION CARPAL TUNNEL RELEASE;  Surgeon: Bradly Bienenstock, MD;  Location: [facility];  Service: Orthopedics;  Laterality: Right;   CARPAL TUNNEL RELEASE Left 09/2016   CERVICAL DISC ARTHROPLASTY  03/2015   COLONOSCOPY W/ POLYPECTOMY  04/15/2014   CYSTO/  RIGHT RETROGRADE PYELOGRAM/ URETEROSCOPY STONE EXTRACTION/  STENT PLACEMENT  06/07/2010   FINGER ARTHRODESIS Right 08/10/2018   Procedure: RIGHT THUMB ARTHRODESIS;  Surgeon: Mack Hook, MD;  Location: [facility];  Service: Orthopedics;  Laterality: Right;   LAPAROSCOPIC GASTRIC BAND REMOVAL WITH LAPAROSCOPIC GASTRIC SLEEVE RESECTION  10/26/2018   LAPAROSCOPIC TOTAL HYSTERECTOMY  04/25/2004   ovaries remain   SPINAL CORD STIMULATOR IMPLANT  02/25/2018   [employer] Model- [phone]  [id number]   TUBAL LIGATION  1982   UPPER GASTROINTESTINAL ENDOSCOPY  12/08/2017   Social History   Social History Narrative   Lives at home w/ her husband   Right-handed   Drinks 2 cups of coffee weekly    3 bottle water per day   Immunization History  Administered Date(s) Administered   Covid-19, Mrna,Vaccine(Spikevax)47yrs and older 11/13/2022   Influenza,inj,Quad PF,6+ Mos 09/15/2014, 10/24/2015, 09/08/2017, 01/29/2019   Influenza-Unspecified 12/25/2020, 10/12/2021, 10/14/2022   Moderna SARS-COV2 Booster Vaccination 10/12/2021   PFIZER(Purple Top)SARS-COV-2 Vaccination 02/29/2020, 03/21/2020, 10/10/2020   Pfizer Covid-19 Vaccine Bivalent Booster 88yrs & up 01/23/2022   Tdap 11/05/2017   Zoster Recombinant(Shingrix) 04/18/2022     Objective: Vital Signs: BP 127/87 (BP Location: Left Arm, Patient Position: Sitting, Cuff Size: Large)   Pulse (!) 105   Resp 17  Ht 5' 6.5" (1.689 m)   Wt 233 lb (105.7 kg)   BMI 37.04 kg/m    Physical Exam Vitals and nursing note reviewed.  Constitutional:      Appearance: She is well-developed.  HENT:     Head: Normocephalic and atraumatic.  Eyes:     Conjunctiva/sclera: Conjunctivae normal.  Cardiovascular:     Rate and Rhythm: Normal rate and regular rhythm.     Heart sounds: Normal heart sounds.  Pulmonary:     Effort: Pulmonary effort is normal.     Breath sounds: Normal breath sounds.  Abdominal:     General: Bowel sounds are normal.     Palpations: Abdomen is soft.  Musculoskeletal:     Cervical back: Normal range of motion.  Lymphadenopathy:     Cervical: No cervical adenopathy.  Skin:    General: Skin is warm and dry.     Capillary Refill: Capillary refill takes less than 2 seconds.  Neurological:     Mental Status: She is alert and oriented to person, place, and time.  Psychiatric:        Behavior: Behavior normal.      Musculoskeletal Exam:  Cervical spine was in good range of motion.  She had discomfort range of motion of the lumbar spine without any point tenderness.  Shoulder joints, elbow joints, wrist joints, MCPs PIPs and DIPs were in good range of motion.  She had discomfort range of motion of her left elbow joint without any warmth swelling or effusion.  There was no tenderness over MCPs or PIP or DIPs.  She had surgical fusion of right first MCP joint.  Hip joints were in good range of motion.  She had some mild tenderness over bilateral trochanteric bursa.  There was no warmth swelling or effusion in her knee joints.  There was no tenderness over ankles or MTPs.  CDAI Exam: CDAI Score: -- Patient Global: --; Provider Global: -- Swollen: --; Tender: -- Joint Exam 06/27/2023   No joint exam has been documented for this visit   There is currently no information documented on the homunculus. Go to the Rheumatology activity and complete the homunculus joint exam.  Investigation: No additional findings.  Imaging: No results found.  Recent Labs: Lab Results  Component Value Date   WBC 3.0 (L) 05/28/2023   HGB 13.2 05/28/2023   PLT 260.0 05/28/2023   NA 142 05/28/2023   K 3.8 05/28/2023   CL 105 05/28/2023   CO2 28 05/28/2023   GLUCOSE 80 05/28/2023   BUN 9 05/28/2023   CREATININE 0.93 05/28/2023   BILITOT 0.4 05/28/2023   ALKPHOS 67 05/28/2023   AST 17 05/28/2023   ALT 13 05/28/2023   PROT 7.3 05/28/2023   ALBUMIN 4.0 05/28/2023   CALCIUM 9.8 05/28/2023   GFRAA >60 08/04/2018    Speciality Comments: No specialty comments available.  Procedures:  No procedures performed Allergies: Aspirin and Bee venom   Assessment / Plan:     Visit Diagnoses: Primary osteoarthritis of both hands - History of discomfort in bilateral hands.  Status post fusion of her right first MCP joint.  X-rays were consistent with osteoarthritis.  Patient continues to have pain and discomfort in her bilateral hands.  No synovitis was  noted.  Joint protection muscle strengthening was discussed.  Pain in left elbow-patient has been experiencing pain and discomfort in the left elbow.  No synovitis was noted.  She had x-rays of her left elbow by Dr. Karie Schwalbe which  were reviewed.  Early degenerative changes were noted.  Patient worked as a Social research officer, government for many years and had repetitive motion of her elbows.  She will follow-up with Dr. Benjamin Stain.  Primary osteoarthritis of right hip -she continues to have discomfort in the right hip joint.  Right hip joint was in good range of motion.  The hip pain could be coming from her back.  She had tenderness over bilateral trochanteric bursa.  10/2002 MRI showed mild osteoarthritis right hip and mild tendinosis of the bilateral hamstrings.  Trochanteric bursitis of both hips-she had bilateral trochanteric bursa injections in January 2024.  Patient states the relief lasted only for short time.  She also went for physical therapy.  She states she has been doing those exercises at home and also going to water aerobics 3 times a week.  She continues to have pain and discomfort.  Primary osteoarthritis of both knees -she complains of discomfort in her bilateral knee joints.  No warmth swelling or effusion was noted.  X-rays showed mild osteoarthritis in the past which were reviewed with the patient..  Primary osteoarthritis of both feet -she has discomfort in the bilateral feet.  No synovitis was noted.  S/p right bunionectomy.  X-ray showed osteoarthritic changes.  Lumbar spondylosis -she has chronic discomfort.  She is followed by a pain management.  She had a spinal stimulator implant in 2019.  Positive ANA (antinuclear antibody) - Low titer positive ANA, ENA negative, complements normal.  She has no clinical features of lupus or related diseases.  No further workup is needed.  Fibromyalgia-she continues to have generalized pain and discomfort from fibromyalgia.  She had positive tender points and  hyperalgesia.  She is on her left meloxicam and tramadol currently.  She should continue with the water aerobics which was emphasized.  Other fatigue-related to fibromyalgia.  Primary hypertension-blood pressure was 127/87.  Vitamin D deficiency  OSA on CPAP  Hereditary and idiopathic peripheral neuropathy  MDD (major depressive disorder), recurrent episode, moderate (HCC)  Family history of lupus erythematosus-cousins, maternal aunt  Orders: No orders of the defined types were placed in this encounter.  No orders of the defined types were placed in this encounter.    Follow-Up Instructions: Return for Osteoarthritis.   Pollyann Savoy, MD  Note - This record has been created using Animal nutritionist.  Chart creation errors have been sought, but may not always  have been located. Such creation errors do not reflect on  the standard of medical care.

## 2023-06-27 ENCOUNTER — Encounter: Payer: Self-pay | Admitting: Rheumatology

## 2023-06-27 ENCOUNTER — Ambulatory Visit: Payer: Medicare PPO | Attending: Rheumatology | Admitting: Rheumatology

## 2023-06-27 VITALS — BP 127/87 | HR 105 | Resp 17 | Ht 66.5 in | Wt 233.0 lb

## 2023-06-27 DIAGNOSIS — M47816 Spondylosis without myelopathy or radiculopathy, lumbar region: Secondary | ICD-10-CM | POA: Diagnosis not present

## 2023-06-27 DIAGNOSIS — M170 Bilateral primary osteoarthritis of knee: Secondary | ICD-10-CM

## 2023-06-27 DIAGNOSIS — M19072 Primary osteoarthritis, left ankle and foot: Secondary | ICD-10-CM

## 2023-06-27 DIAGNOSIS — M19071 Primary osteoarthritis, right ankle and foot: Secondary | ICD-10-CM

## 2023-06-27 DIAGNOSIS — M1611 Unilateral primary osteoarthritis, right hip: Secondary | ICD-10-CM | POA: Diagnosis not present

## 2023-06-27 DIAGNOSIS — Z840 Family history of diseases of the skin and subcutaneous tissue: Secondary | ICD-10-CM

## 2023-06-27 DIAGNOSIS — M19041 Primary osteoarthritis, right hand: Secondary | ICD-10-CM

## 2023-06-27 DIAGNOSIS — R768 Other specified abnormal immunological findings in serum: Secondary | ICD-10-CM | POA: Diagnosis not present

## 2023-06-27 DIAGNOSIS — I10 Essential (primary) hypertension: Secondary | ICD-10-CM

## 2023-06-27 DIAGNOSIS — M7062 Trochanteric bursitis, left hip: Secondary | ICD-10-CM

## 2023-06-27 DIAGNOSIS — G609 Hereditary and idiopathic neuropathy, unspecified: Secondary | ICD-10-CM

## 2023-06-27 DIAGNOSIS — M797 Fibromyalgia: Secondary | ICD-10-CM

## 2023-06-27 DIAGNOSIS — M25522 Pain in left elbow: Secondary | ICD-10-CM

## 2023-06-27 DIAGNOSIS — M19042 Primary osteoarthritis, left hand: Secondary | ICD-10-CM

## 2023-06-27 DIAGNOSIS — E559 Vitamin D deficiency, unspecified: Secondary | ICD-10-CM

## 2023-06-27 DIAGNOSIS — G4733 Obstructive sleep apnea (adult) (pediatric): Secondary | ICD-10-CM

## 2023-06-27 DIAGNOSIS — M7061 Trochanteric bursitis, right hip: Secondary | ICD-10-CM

## 2023-06-27 DIAGNOSIS — R5383 Other fatigue: Secondary | ICD-10-CM

## 2023-06-27 DIAGNOSIS — F331 Major depressive disorder, recurrent, moderate: Secondary | ICD-10-CM

## 2023-07-04 ENCOUNTER — Ambulatory Visit: Payer: Medicare PPO | Admitting: Sports Medicine

## 2023-07-09 ENCOUNTER — Ambulatory Visit: Payer: Medicare PPO | Admitting: Family Medicine

## 2023-07-09 DIAGNOSIS — F331 Major depressive disorder, recurrent, moderate: Secondary | ICD-10-CM

## 2023-07-09 DIAGNOSIS — F411 Generalized anxiety disorder: Secondary | ICD-10-CM

## 2023-07-10 ENCOUNTER — Ambulatory Visit (INDEPENDENT_AMBULATORY_CARE_PROVIDER_SITE_OTHER): Payer: Medicare PPO | Admitting: Sports Medicine

## 2023-07-10 DIAGNOSIS — G894 Chronic pain syndrome: Secondary | ICD-10-CM | POA: Diagnosis not present

## 2023-07-10 NOTE — Assessment & Plan Note (Signed)
This pleasant 67 year old female returns, she has multifactorial chronic pain, neck, shoulders, back, hips. She does have mild hip osteoarthritis, she did not have sufficient response to hip joint injection, she did have a partial response to a lumbar epidural. She does complain of widespread pain though in spite of meloxicam, high-dose tramadol, amitriptyline, high-dose Cymbalta. I do not think any of her structural findings on her hip MRI or lumbar spine MRI are severe enough to warrant operative intervention, she did see a pain provider in [place] and I would like to get her back in with pain management.

## 2023-07-10 NOTE — Progress Notes (Signed)
    Procedures performed today:    None.  Independent interpretation of notes and tests performed by another provider:   None.  Brief History, Exam, Impression, and Recommendations:    Chronic pain syndrome This pleasant 67 year old female returns, she has multifactorial chronic pain, neck, shoulders, back, hips. She does have mild hip osteoarthritis, she did not have sufficient response to hip joint injection, she did have a partial response to a lumbar epidural. She does complain of widespread pain though in spite of meloxicam, high-dose tramadol, amitriptyline, high-dose Cymbalta. I do not think any of her structural findings on her hip MRI or lumbar spine MRI are severe enough to warrant operative intervention, she did see a pain provider in [place] and I would like to get her back in with pain management.    ____________________________________________ Ihor Austin. Benjamin Stain, M.D., ABFM., CAQSM., AME. Primary Care and Sports Medicine [facility]  Adjunct Professor of Family Medicine  [employer]  Restaurant manager, fast food

## 2023-07-31 ENCOUNTER — Encounter (INDEPENDENT_AMBULATORY_CARE_PROVIDER_SITE_OTHER): Payer: Self-pay

## 2023-09-05 ENCOUNTER — Encounter: Payer: Self-pay | Admitting: Family Medicine

## 2023-09-05 ENCOUNTER — Ambulatory Visit (INDEPENDENT_AMBULATORY_CARE_PROVIDER_SITE_OTHER): Payer: Medicare PPO | Admitting: Family Medicine

## 2023-09-05 ENCOUNTER — Telehealth: Payer: Self-pay

## 2023-09-05 VITALS — BP 132/80 | HR 70 | Temp 97.8°F | Ht 66.0 in | Wt 220.4 lb

## 2023-09-05 DIAGNOSIS — E669 Obesity, unspecified: Secondary | ICD-10-CM | POA: Diagnosis not present

## 2023-09-05 DIAGNOSIS — G43909 Migraine, unspecified, not intractable, without status migrainosus: Secondary | ICD-10-CM

## 2023-09-05 DIAGNOSIS — Z6835 Body mass index (BMI) 35.0-35.9, adult: Secondary | ICD-10-CM

## 2023-09-05 LAB — CBC WITH DIFFERENTIAL/PLATELET
Basophils Absolute: 0 10*3/uL (ref 0.0–0.1)
Basophils Relative: 0.6 % (ref 0.0–3.0)
Eosinophils Absolute: 0.1 10*3/uL (ref 0.0–0.7)
Eosinophils Relative: 1.7 % (ref 0.0–5.0)
HCT: 40.7 % (ref 36.0–46.0)
Hemoglobin: 13 g/dL (ref 12.0–15.0)
Lymphocytes Relative: 25.8 % (ref 12.0–46.0)
Lymphs Abs: 1 10*3/uL (ref 0.7–4.0)
MCHC: 31.9 g/dL (ref 30.0–36.0)
MCV: 92 fl (ref 78.0–100.0)
Monocytes Absolute: 0.3 10*3/uL (ref 0.1–1.0)
Monocytes Relative: 7.7 % (ref 3.0–12.0)
Neutro Abs: 2.5 10*3/uL (ref 1.4–7.7)
Neutrophils Relative %: 64.2 % (ref 43.0–77.0)
Platelets: 253 10*3/uL (ref 150.0–400.0)
RBC: 4.42 Mil/uL (ref 3.87–5.11)
RDW: 12.8 % (ref 11.5–15.5)
WBC: 3.8 10*3/uL — ABNORMAL LOW (ref 4.0–10.5)

## 2023-09-05 LAB — HEPATIC FUNCTION PANEL
ALT: 9 U/L (ref 0–35)
AST: 12 U/L (ref 0–37)
Albumin: 4 g/dL (ref 3.5–5.2)
Alkaline Phosphatase: 73 U/L (ref 39–117)
Bilirubin, Direct: 0.1 mg/dL (ref 0.0–0.3)
Total Bilirubin: 0.5 mg/dL (ref 0.2–1.2)
Total Protein: 7 g/dL (ref 6.0–8.3)

## 2023-09-05 LAB — BASIC METABOLIC PANEL
BUN: 12 mg/dL (ref 6–23)
CO2: 27 mEq/L (ref 19–32)
Calcium: 9.5 mg/dL (ref 8.4–10.5)
Chloride: 106 mEq/L (ref 96–112)
Creatinine, Ser: 0.91 mg/dL (ref 0.40–1.20)
GFR: 66.48 mL/min (ref >60.00)
Glucose, Bld: 109 mg/dL — ABNORMAL HIGH (ref 70–99)
Potassium: 4 mEq/L (ref 3.5–5.1)
Sodium: 141 mEq/L (ref 135–145)

## 2023-09-05 LAB — LIPID PANEL
Cholesterol: 177 mg/dL (ref 0–200)
HDL: 56.1 mg/dL (ref >39.00)
LDL Cholesterol: 102 mg/dL — ABNORMAL HIGH (ref 0–99)
NonHDL: 120.59
Total CHOL/HDL Ratio: 3
Triglycerides: 93 mg/dL (ref 0.0–149.0)
VLDL: 18.6 mg/dL (ref 0.0–40.0)

## 2023-09-05 LAB — TSH: TSH: 1.11 u[IU]/mL (ref 0.35–5.50)

## 2023-09-05 MED ORDER — SUMATRIPTAN SUCCINATE 50 MG PO TABS
50.0000 mg | ORAL_TABLET | ORAL | 6 refills | Status: AC | PRN
Start: 2023-09-05 — End: ?

## 2023-09-05 MED ORDER — CLONAZEPAM 0.5 MG PO TABS: ORAL_TABLET | ORAL | 3 refills | Status: DC

## 2023-09-05 NOTE — Telephone Encounter (Signed)
-----   Message from Neena Rhymes sent at 09/05/2023  3:26 PM EDT ----- Labs look great!  No changes at this time

## 2023-09-05 NOTE — Assessment & Plan Note (Signed)
Pt is down 13 lbs since July.  Applauded her efforts at healthy diet and regular exercise but also encouraged her to make sure she was eating regularly.  Check labs.  Will follow.

## 2023-09-05 NOTE — Telephone Encounter (Signed)
Called patient to discuss lab work, no answer, left a message for the patient to call back to discuss or review by MyChart if that is prefered.

## 2023-09-05 NOTE — Progress Notes (Signed)
Subjective:    Patient ID: Linda Buckley, female    DOB: [birth year], 67 y.o.   MRN: [id number]  HPI Obesity- pt is down 13 lbs since July.  Pt reports decreased appetite.  Continues to exercise regularly.  No CP, SOB.  Continues to have HA's.  No visual changes.  No abd pain.  Migraines- [name] was too expensive for pt to pick up.  Has never been on another rescue medication.   Review of Systems For ROS see HPI     Objective:   Physical Exam Vitals reviewed.  Constitutional:      General: She is not in acute distress.    Appearance: Normal appearance. She is well-developed. She is obese. She is not ill-appearing.  HENT:     Head: Normocephalic and atraumatic.  Eyes:     Conjunctiva/sclera: Conjunctivae normal.     Pupils: Pupils are equal, round, and reactive to light.  Neck:     Thyroid: No thyromegaly.  Cardiovascular:     Rate and Rhythm: Normal rate and regular rhythm.     Pulses: Normal pulses.     Heart sounds: Normal heart sounds. No murmur heard. Pulmonary:     Effort: Pulmonary effort is normal. No respiratory distress.     Breath sounds: Normal breath sounds.  Abdominal:     General: There is no distension.     Palpations: Abdomen is soft.     Tenderness: There is no abdominal tenderness.  Musculoskeletal:     Cervical back: Normal range of motion and neck supple.     Right lower leg: No edema.     Left lower leg: No edema.  Lymphadenopathy:     Cervical: No cervical adenopathy.  Skin:    General: Skin is warm and dry.  Neurological:     General: No focal deficit present.     Mental Status: She is alert and oriented to person, place, and time.  Psychiatric:        Mood and Affect: Mood normal.        Behavior: Behavior normal.        Thought Content: Thought content normal.           Assessment & Plan:

## 2023-09-05 NOTE — Patient Instructions (Signed)
Schedule your complete physical in 6 months We'll notify you of your lab results and make any changes if needed Keep up the good work on healthy diet and regular exercise- you look great!! If you develop a migraine, take an Imitrex Call with any questions or concerns Happy Fall!!!

## 2023-09-05 NOTE — Assessment & Plan Note (Signed)
Pt continues to have HA's bc she was not able to get the [place].  Will start Imitrex as needed but did caution her regarding the potential for serotonin syndrome since she is on Amitriptyline, Wellbutrin, Cymbalta.  Pt aware that she is not to use for regular HA's, only migraines.

## 2023-09-08 NOTE — Telephone Encounter (Signed)
Pt has been notified.

## 2023-09-11 ENCOUNTER — Ambulatory Visit
Admission: RE | Admit: 2023-09-11 | Discharge: 2023-09-11 | Disposition: A | Discharge status: Home / Self Care | Payer: Medicare PPO | Source: Ambulatory Visit | Attending: Family Medicine | Admitting: Family Medicine

## 2023-09-11 DIAGNOSIS — N958 Other specified menopausal and perimenopausal disorders: Secondary | ICD-10-CM | POA: Diagnosis not present

## 2023-09-11 DIAGNOSIS — E349 Endocrine disorder, unspecified: Secondary | ICD-10-CM | POA: Diagnosis not present

## 2023-09-11 DIAGNOSIS — M8588 Other specified disorders of bone density and structure, other site: Secondary | ICD-10-CM | POA: Diagnosis not present

## 2023-09-15 ENCOUNTER — Telehealth: Payer: Self-pay

## 2023-09-15 NOTE — Telephone Encounter (Signed)
Left vm to call office

## 2023-09-15 NOTE — Telephone Encounter (Signed)
Pt has been notified.

## 2023-09-15 NOTE — Telephone Encounter (Signed)
-----   Message from Neena Rhymes sent at 09/15/2023  7:25 AM EDT ----- Your bone density test looks good!  It shows osteopenia (which is thinning bone but not as severe as osteoporosis).  Continue to take daily Calcium and Vit D.

## 2023-10-02 DIAGNOSIS — G4733 Obstructive sleep apnea (adult) (pediatric): Secondary | ICD-10-CM | POA: Diagnosis not present

## 2023-10-17 ENCOUNTER — Telehealth: Payer: Self-pay | Admitting: Family Medicine

## 2023-10-17 DIAGNOSIS — M160 Bilateral primary osteoarthritis of hip: Secondary | ICD-10-CM

## 2023-10-17 NOTE — Telephone Encounter (Signed)
Received incoming call from patient inquiring about a referral to a Hip specialist. Patient states her pain has increased and she would like to see specialist. Please advise.   Pt reminded that previous referral was sent to [facility] to see pain specialist. Pt mentioned that referral was not scheduled due to previous provider leaving practice. Per notes, patient had a past due balance that needed to be paid before appointment could be scheduled.

## 2023-10-17 NOTE — Telephone Encounter (Signed)
Referral placed to ortho surgery. No further appts needed with me.

## 2023-10-20 NOTE — Telephone Encounter (Signed)
Referral, clinical notes, imaging reports, demographics and copies of insurance cards have been faxed to [facility] at [phone]. Office will contact patient to schedule referral appointment.

## 2023-10-29 DIAGNOSIS — M160 Bilateral primary osteoarthritis of hip: Secondary | ICD-10-CM | POA: Diagnosis not present

## 2023-11-17 ENCOUNTER — Telehealth: Payer: Self-pay | Admitting: Family Medicine

## 2023-11-17 DIAGNOSIS — B9789 Other viral agents as the cause of diseases classified elsewhere: Secondary | ICD-10-CM | POA: Diagnosis not present

## 2023-11-17 DIAGNOSIS — R062 Wheezing: Secondary | ICD-10-CM | POA: Diagnosis not present

## 2023-11-17 DIAGNOSIS — J069 Acute upper respiratory infection, unspecified: Secondary | ICD-10-CM | POA: Diagnosis not present

## 2023-11-17 DIAGNOSIS — K449 Diaphragmatic hernia without obstruction or gangrene: Secondary | ICD-10-CM | POA: Diagnosis not present

## 2023-11-17 DIAGNOSIS — I2699 Other pulmonary embolism without acute cor pulmonale: Secondary | ICD-10-CM | POA: Diagnosis not present

## 2023-11-17 DIAGNOSIS — Z87891 Personal history of nicotine dependence: Secondary | ICD-10-CM | POA: Diagnosis not present

## 2023-11-17 DIAGNOSIS — R9431 Abnormal electrocardiogram [ECG] [EKG]: Secondary | ICD-10-CM | POA: Diagnosis not present

## 2023-11-17 DIAGNOSIS — I2693 Single subsegmental pulmonary embolism without acute cor pulmonale: Secondary | ICD-10-CM | POA: Diagnosis not present

## 2023-11-17 DIAGNOSIS — Z79899 Other long term (current) drug therapy: Secondary | ICD-10-CM | POA: Diagnosis not present

## 2023-11-17 DIAGNOSIS — I10 Essential (primary) hypertension: Secondary | ICD-10-CM | POA: Diagnosis not present

## 2023-11-17 DIAGNOSIS — R079 Chest pain, unspecified: Secondary | ICD-10-CM | POA: Diagnosis not present

## 2023-11-17 NOTE — Telephone Encounter (Signed)
Pt called in to make a same day appt due to coughing, sore throat x 4 days, and earache. She also said she was experiencing SOB so she was transferred to triage.

## 2023-11-17 NOTE — Telephone Encounter (Signed)
Patient Name First: Linda Last: Buckley Gender: Female DOB: 1956/01/01 Age: 67 Y 1 M 8 D Return Phone Number: [phone] (Primary) Address: [address] City/ State/ Zip: [place]  [address] Client [facility] - Cli Client Site [facility] - Day Provider Lezlie Octave- MD Contact Type Call Who Is Calling Patient / Member / Family / Caregiver Call Type Triage / Clinical Caller Name Nat Math Relationship To Patient Provider Return Phone Number [phone] (Primary) Chief Complaint BREATHING - shortness of breath or sounds breathless Reason for Call Symptomatic / Request for Health Information Initial Comment Caller states she has a patient complaining of shortness of breath. Translation No Nurse Assessment Nurse: Scarlette Ar, RN, Heather Date/Time (Eastern Time): 11/17/2023 8:12:36 AM Confirm and document reason for call. If symptomatic, describe symptoms. ---Caller states that she started with a cough about 4 days ago and it got a lot worse over night. She is having SOB Does the patient have any new or worsening symptoms? ---Yes Will a triage be completed? ---Yes Related visit to physician within the last 2 weeks? ---No Does the PT have any chronic conditions? (i.e. diabetes, asthma, this includes High risk factors for pregnancy, etc.) ---Yes List chronic conditions. ---fibromyalgia, inhaler needed due to fibro. Is this a behavioral health or substance abuse call? ---No Guidelines Guideline Title Affirmed Question Affirmed Notes Nurse Date/Time (Eastern Time) Cough - Acute NonProductive [1] MODERATE difficulty breathing (e.g., speaks in phrases, SOB even at rest, pulse 100-120) Standifer, RN, Herbert Seta 11/17/2023 8:13:41 AM PLEASE NOTE: All timestamps contained within this report are represented as [place] Standard Time. CONFIDENTIALTY NOTICE: This fax transmission is intended  only for the addressee. It contains information that is legally privileged, confidential or otherwise protected from use or disclosure. If you are not the intended recipient, you are strictly prohibited from reviewing, disclosing, copying using or disseminating any of this information or taking any action in reliance on or regarding this information. If you have received this fax in error, please notify us immediately by telephone so that we can arrange for its return to [name]. Phone: [phone], Toll-Free: [phone], Fax: [phone] Page: 2 of 2 Call Id: [id number] Guidelines Guideline Title Affirmed Question Affirmed Notes Nurse Date/Time Lamount Cohen Time) AND [2] still present when not coughing Disp. Time Lamount Cohen Time) Disposition Final User 11/17/2023 8:08:07 AM Send to Urgent Toribio Harbour, [place] 11/17/2023 8:16:44 AM Go to ED Now Yes Scarlette Ar, RN, Herbert Seta Final Disposition 11/17/2023 8:16:44 AM Go to ED Now Yes Standifer, RN, Sibyl Parr Disagree/Comply Comply Caller Understands Yes PreDisposition Call Doctor Care Advice Given Per Guideline GO TO ED NOW: * You need to be seen in the Emergency Department. CARE ADVICE given per Cough - Acute Non-Productive (Adult) guideline. Referrals [facility] - ED  Pt called back to tell us she is going to the ED closest to her because [address] and she lives in [place]

## 2023-11-18 ENCOUNTER — Telehealth: Payer: Self-pay

## 2023-11-18 NOTE — Transitions of Care (Post Inpatient/ED Visit) (Signed)
   11/18/2023  Name: Linda Buckley MRN: [id number] DOB: 06-15-1956  Today's TOC FU Call Status: Today's TOC FU Call Status:: Unsuccessful Call (1st Attempt) Unsuccessful Call (1st Attempt) Date: 11/18/23  Attempted to reach the patient regarding the most recent Inpatient/ED visit.  Follow Up Plan: Additional outreach attempts will be made to reach the patient to complete the Transitions of Care (Post Inpatient/ED visit) call.   Abby Reginia Battie, CMA  [facility] AWV Team Direct Dial: [phone]

## 2023-11-21 ENCOUNTER — Ambulatory Visit (INDEPENDENT_AMBULATORY_CARE_PROVIDER_SITE_OTHER): Payer: Medicare PPO | Admitting: Family Medicine

## 2023-11-21 ENCOUNTER — Encounter: Payer: Self-pay | Admitting: Family Medicine

## 2023-11-21 VITALS — BP 122/82 | HR 78 | Temp 98.0°F | Ht 66.0 in | Wt 214.2 lb

## 2023-11-21 DIAGNOSIS — I2699 Other pulmonary embolism without acute cor pulmonale: Secondary | ICD-10-CM | POA: Insufficient documentation

## 2023-11-21 NOTE — Assessment & Plan Note (Signed)
New.  Pt was seen on 12/2 at [facility] ER and dx'd w/ R sided PE.  Thankfully no heart strain. She was prescribed Xarelto but was not able to pay for the entire prescription.  She denies any immobility or travel.  Unclear as to origin of clot other than recent respiratory illness- pt has been in usual state of health.  Will refer to Hematology for complete evaluation and hopefully assistance in obtaining Xarelto prescription.  Pt expressed understanding and is in agreement w/ plan.

## 2023-11-21 NOTE — Progress Notes (Signed)
   Subjective:    Patient ID: Linda Buckley, female    DOB: Apr 16, 1956, 67 y.o.   MRN: [id number]  HPI ER- pt presented to the ER on 12/2 w/ SOB and flu like sxs.  Respiratory panel was negative.  She was found to have a blood clot in R lung.  She was prescribed Prednisone and Xarelto.  She is currently on 15mg  BID and will then switch to 20mg  daily.  Some improvement in SOB.  Cough is better.  Since starting Xarelto has had some bleeding of gums.  Pt denies any recent travel or immobility.     Review of Systems For ROS see HPI     Objective:   Physical Exam Vitals reviewed.  Constitutional:      General: She is not in acute distress.    Appearance: Normal appearance. She is not ill-appearing.  HENT:     Head: Normocephalic and atraumatic.  Eyes:     Extraocular Movements: Extraocular movements intact.     Conjunctiva/sclera: Conjunctivae normal.  Cardiovascular:     Rate and Rhythm: Normal rate and regular rhythm.  Pulmonary:     Effort: Pulmonary effort is normal. No respiratory distress.     Breath sounds: No wheezing or rhonchi.  Skin:    General: Skin is warm and dry.  Neurological:     Mental Status: She is alert and oriented to person, place, and time. Mental status is at baseline.  Psychiatric:        Mood and Affect: Mood normal.        Behavior: Behavior normal.        Thought Content: Thought content normal.           Assessment & Plan:

## 2023-11-21 NOTE — Patient Instructions (Addendum)
Follow up as needed or as scheduled We'll call you to schedule your Hematology appt Continue the Xarelto as directed Do not take any Advil, Ibuprofen, Aspirin, etc while on the Xarelto Call with any questions or concerns Hang in there! Happy Holidays!!!

## 2023-11-26 DIAGNOSIS — K912 Postsurgical malabsorption, not elsewhere classified: Secondary | ICD-10-CM | POA: Diagnosis not present

## 2023-11-26 DIAGNOSIS — Z903 Acquired absence of stomach [part of]: Secondary | ICD-10-CM | POA: Diagnosis not present

## 2023-11-29 ENCOUNTER — Other Ambulatory Visit: Payer: Self-pay | Admitting: Family Medicine

## 2023-12-11 DIAGNOSIS — G4733 Obstructive sleep apnea (adult) (pediatric): Secondary | ICD-10-CM | POA: Diagnosis not present

## 2023-12-11 DIAGNOSIS — K912 Postsurgical malabsorption, not elsewhere classified: Secondary | ICD-10-CM | POA: Diagnosis not present

## 2023-12-11 DIAGNOSIS — R7303 Prediabetes: Secondary | ICD-10-CM | POA: Diagnosis not present

## 2023-12-11 DIAGNOSIS — Z903 Acquired absence of stomach [part of]: Secondary | ICD-10-CM | POA: Diagnosis not present

## 2023-12-15 ENCOUNTER — Telehealth: Payer: Self-pay | Admitting: Oncology

## 2023-12-15 NOTE — Telephone Encounter (Signed)
Rescheduled appointment per room/resource. Patient is aware of the changes made to her upcoming appointments.  

## 2023-12-18 ENCOUNTER — Other Ambulatory Visit (HOSPITAL_BASED_OUTPATIENT_CLINIC_OR_DEPARTMENT_OTHER): Payer: Self-pay

## 2023-12-18 ENCOUNTER — Other Ambulatory Visit (HOSPITAL_COMMUNITY): Payer: Self-pay

## 2023-12-18 ENCOUNTER — Inpatient Hospital Stay: Payer: Medicare PPO

## 2023-12-18 ENCOUNTER — Inpatient Hospital Stay: Payer: Medicare PPO | Attending: Oncology | Admitting: Oncology

## 2023-12-18 VITALS — BP 141/76 | HR 79 | Temp 97.3°F | Resp 16 | Wt 212.5 lb

## 2023-12-18 DIAGNOSIS — M25471 Effusion, right ankle: Secondary | ICD-10-CM | POA: Insufficient documentation

## 2023-12-18 DIAGNOSIS — I2699 Other pulmonary embolism without acute cor pulmonale: Secondary | ICD-10-CM | POA: Insufficient documentation

## 2023-12-18 DIAGNOSIS — Z79899 Other long term (current) drug therapy: Secondary | ICD-10-CM | POA: Diagnosis not present

## 2023-12-18 DIAGNOSIS — Z9884 Bariatric surgery status: Secondary | ICD-10-CM | POA: Diagnosis not present

## 2023-12-18 DIAGNOSIS — G4730 Sleep apnea, unspecified: Secondary | ICD-10-CM | POA: Diagnosis not present

## 2023-12-18 DIAGNOSIS — Z7901 Long term (current) use of anticoagulants: Secondary | ICD-10-CM | POA: Diagnosis not present

## 2023-12-18 DIAGNOSIS — Z791 Long term (current) use of non-steroidal anti-inflammatories (NSAID): Secondary | ICD-10-CM | POA: Insufficient documentation

## 2023-12-18 DIAGNOSIS — Z8711 Personal history of peptic ulcer disease: Secondary | ICD-10-CM | POA: Diagnosis not present

## 2023-12-18 DIAGNOSIS — R011 Cardiac murmur, unspecified: Secondary | ICD-10-CM | POA: Insufficient documentation

## 2023-12-18 DIAGNOSIS — M25472 Effusion, left ankle: Secondary | ICD-10-CM | POA: Diagnosis not present

## 2023-12-18 DIAGNOSIS — M797 Fibromyalgia: Secondary | ICD-10-CM | POA: Diagnosis not present

## 2023-12-18 DIAGNOSIS — R634 Abnormal weight loss: Secondary | ICD-10-CM | POA: Insufficient documentation

## 2023-12-18 DIAGNOSIS — Z87442 Personal history of urinary calculi: Secondary | ICD-10-CM | POA: Insufficient documentation

## 2023-12-18 DIAGNOSIS — I10 Essential (primary) hypertension: Secondary | ICD-10-CM | POA: Diagnosis not present

## 2023-12-18 LAB — CBC WITH DIFFERENTIAL/PLATELET
Abs Immature Granulocytes: 0.01 10*3/uL (ref 0.00–0.07)
Basophils Absolute: 0 10*3/uL (ref 0.0–0.1)
Basophils Relative: 0 %
Eosinophils Absolute: 0.1 10*3/uL (ref 0.0–0.5)
Eosinophils Relative: 1 %
HCT: 43 % (ref 36.0–46.0)
Hemoglobin: 13.7 g/dL (ref 12.0–15.0)
Immature Granulocytes: 0 %
Lymphocytes Relative: 21 %
Lymphs Abs: 0.8 10*3/uL (ref 0.7–4.0)
MCH: 29.7 pg (ref 26.0–34.0)
MCHC: 31.9 g/dL (ref 30.0–36.0)
MCV: 93.3 fL (ref 80.0–100.0)
Monocytes Absolute: 0.2 10*3/uL (ref 0.1–1.0)
Monocytes Relative: 6 %
Neutro Abs: 2.7 10*3/uL (ref 1.7–7.7)
Neutrophils Relative %: 72 %
Platelets: 258 10*3/uL (ref 150–400)
RBC: 4.61 MIL/uL (ref 3.87–5.11)
RDW: 12.4 % (ref 11.5–15.5)
WBC: 3.8 10*3/uL — ABNORMAL LOW (ref 4.0–10.5)
nRBC: 0 % (ref 0.0–0.2)

## 2023-12-18 LAB — COMPREHENSIVE METABOLIC PANEL
ALT: 10 U/L (ref 0–44)
AST: 14 U/L — ABNORMAL LOW (ref 15–41)
Albumin: 4.4 g/dL (ref 3.5–5.0)
Alkaline Phosphatase: 80 U/L (ref 38–126)
Anion gap: 5 (ref 5–15)
BUN: 14 mg/dL (ref 8–23)
CO2: 30 mmol/L (ref 22–32)
Calcium: 10.3 mg/dL (ref 8.9–10.3)
Chloride: 107 mmol/L (ref 98–111)
Creatinine, Ser: 0.93 mg/dL (ref 0.44–1.00)
GFR, Estimated: >60 mL/min (ref >60)
Glucose, Bld: 97 mg/dL (ref 70–99)
Potassium: 4.1 mmol/L (ref 3.5–5.1)
Sodium: 142 mmol/L (ref 135–145)
Total Bilirubin: 0.5 mg/dL (ref 0.0–1.2)
Total Protein: 7.6 g/dL (ref 6.5–8.1)

## 2023-12-18 LAB — D-DIMER, QUANTITATIVE: D-Dimer, Quant: <0.27 ug{FEU}/mL (ref 0.00–0.50)

## 2023-12-18 MED ORDER — APIXABAN 5 MG PO TABS
5.0000 mg | ORAL_TABLET | Freq: Two times a day (BID) | ORAL | 4 refills | Status: DC
  Filled 2023-12-18: qty 60, 30d supply, fill #0
  Filled 2023-12-19: qty 4, 2d supply, fill #0

## 2023-12-18 NOTE — Progress Notes (Signed)
 [facility]  HEMATOLOGY CLINIC CONSULTATION NOTE   PATIENT NAME: Linda Buckley   MR#: [id number] DOB: 1956-07-02  DATE OF SERVICE: 12/18/2023   REFERRING PHYSICIAN  Mahlon Comer BRAVO, MD   Patient Care Team: Mahlon Comer BRAVO, MD as PCP - General (Family Medicine) Shari Easter, MD as Consulting Physician (Orthopedic Surgery) Bonner Ade, MD as Consulting Physician (Physical Medicine and Rehabilitation) Burnetta Aures, MD as Consulting Physician (Orthopedic Surgery) Teressa Toribio SQUIBB, MD as Attending Physician (Gastroenterology) Jenel Carlin POUR, MD (Inactive) as Consulting Physician (Neurology) Johnson Laymon CHRISTELLA RIGGERS as Physician Assistant (Physician Assistant) Sherryl Bouchard, NP as Registered Nurse (Gerontology) Gable Charleston, PA-C as Physician Assistant (Orthopedic Surgery) Pandora Cadet, [facility] (Inactive) as Pharmacist (Pharmacist)   REASON FOR CONSULTATION/ CHIEF COMPLAINT:  Evaluation of possible hypercoagulable state in a patient with pulmonary embolism.  ASSESSMENT & PLAN:  [name] is a 68 y.o. lady with a past medical history of hypertension, sleep apnea, fibromyalgia, depression, gastric sleeve surgery, was referred to our service for evaluation of possible hypercoagulable state after she was diagnosed with pulmonary embolism on 11/17/2023.  PE (pulmonary thromboembolism) (HCC) First episode of PE on 11/17/2023 with no clear provoking factors. Shortness of breath and bilateral ankle swelling reported. Family history of blood clots.  -We will obtain hypercoagulable workup with prothrombin gene mutation, factor V Leiden mutation, beta-2  glycoprotein antibodies, anticardiolipin antibodies, lupus anticoagulant.  Rest of the hypercoagulable workup will be deferred since it can be falsely abnormal given recent embolism.  -D-dimer today is undetectable, indicating adequate anticoagulation.  -Patient has had trouble filling her Xarelto   prescription as it was not approved by her insurance and she had to pay out-of-pocket.  We will switch anticoagulation to Eliquis  5 mg p.o. twice daily and hopefully this will be covered by her insurance.  If she has trouble filling the medication, we will enroll her in patient assistance program.  -At the minimum 6 months of anticoagulation is recommended.  -I will see her approximately in 6 weeks to ensure medication compliance and for routine follow-up.  Weight loss Lost 24 pounds over the past 3 months without trying. -CBCD and CMP unremarkable today.  -It is likely from her gastric sleeve surgery.   -If alarming unintentional weight loss, we will consider referral for GI evaluation on return visit.   I reviewed lab results and outside records for this visit and discussed relevant results with the patient. Diagnosis, plan of care and treatment options were also discussed in detail with the patient. Opportunity provided to ask questions and answers provided to her apparent satisfaction. Provided instructions to call our clinic with any problems, questions or concerns prior to return visit. I recommended to continue follow-up with PCP and sub-specialists. She verbalized understanding and agreed with the plan. No barriers to learning was detected.  Chinita Patten, MD  12/18/2023 3:02 PM  [facility] - A DEPT OF [facility] [address] [place] [address] Dept: [phone] Dept Fax: [phone]   HISTORY OF PRESENT ILLNESS:  Discussed the use of AI scribe software for clinical note transcription with the patient, who gave verbal consent to proceed.   The patient was admitted to the hospital on 11/17/2023 after experiencing shortness of breath and a lingering cold.  CT angiogram showed pulmonary embolism in the right lung with most proximal thrombus at lobar pulmonary artery level.  No evidence of right heart strain.  She  was initially on heparin  drip and was later started on Xarelto .  The patient denies any recent surgeries, prolonged bed rest, or long trips. There is a family history of a blood clot in the patient's father. The patient reports bilateral ankle swelling.  In addition to the pulmonary embolism, the patient reports unintentional weight loss of 24 pounds over the past two months without any changes in diet or exercise. The patient also reports changes in bowel habits, with increased frequency and loose stools over the past two weeks. The patient denies any chest pain or acid reflux symptoms.  She denies fever, cough, diarrhea, or other infectious symptoms.  She denies epistaxis, bloody stool, melena, hematuria, bruising or other bleeding symptoms. She also denies unintentional weight loss, night sweats or other constitutional symptoms.  MEDICAL HISTORY Past Medical History:  Diagnosis Date   Allergy    Anxiety    Arthritis    Carpal tunnel syndrome of right wrist    RECURRENT   Depression    Dysphagia 09/02/2017   Edema 09/02/2017   Environmental allergies    dust mites, pollen, roaches and other insects, mold   Fibromyalgia 04/2016   Headache    migraines   Heart murmur    Hereditary and idiopathic peripheral neuropathy 04/03/2016   History of adenomatous polyp of colon    History of gastric ulcer    History of kidney stones    Hypertension    Pneumonia    Sleep apnea    wears CPAP   Wears glasses      SURGICAL HISTORY Past Surgical History:  Procedure Laterality Date   CARPAL TUNNEL RELEASE Right 2007   CARPAL TUNNEL RELEASE Right 07/05/2015   Procedure: RIGHT HAND REVISION CARPAL TUNNEL RELEASE;  Surgeon: Prentice Pagan, MD;  Location: [facility] OR;  Service: Orthopedics;  Laterality: Right;   CARPAL TUNNEL RELEASE Left 09/2016   CERVICAL DISC ARTHROPLASTY  03/2015   COLONOSCOPY W/ POLYPECTOMY  04/15/2014   CYSTO/  RIGHT RETROGRADE PYELOGRAM/ URETEROSCOPY STONE EXTRACTION/  STENT  PLACEMENT  06/07/2010   FINGER ARTHRODESIS Right 08/10/2018   Procedure: RIGHT THUMB ARTHRODESIS;  Surgeon: Sebastian Lenis, MD;  Location: [facility];  Service: Orthopedics;  Laterality: Right;   LAPAROSCOPIC GASTRIC BAND REMOVAL WITH LAPAROSCOPIC GASTRIC SLEEVE RESECTION  10/26/2018   LAPAROSCOPIC TOTAL HYSTERECTOMY  04/25/2004   ovaries remain   SPINAL CORD STIMULATOR IMPLANT  02/25/2018   [employer] Model- [phone] [email or web address]   TUBAL LIGATION  1982   UPPER GASTROINTESTINAL ENDOSCOPY  12/08/2017     SOCIAL HISTORY: She reports that she has never smoked. She has been exposed to tobacco smoke. She has never used smokeless tobacco. She reports current alcohol use. She reports that she does not use drugs. Social History   Socioeconomic History   Marital status: Married    Spouse name: Not on file   Number of children: 2   Years of education: 2 yrs college   Highest education level: Some college, no degree  Occupational History   Occupation: [employer]    Commentmedia planner  Tobacco Use   Smoking status: Never    Passive exposure: Current   Smokeless tobacco: Never  Vaping Use   Vaping status: Never Used  Substance and Sexual Activity   Alcohol use: Yes    Comment: occ   Drug use: No   Sexual activity: Yes    Partners: Male    Birth control/protection: None    Comment: partial hysterectomy  Other Topics Concern   Not on file  Social History Narrative   Lives at home w/ her husband   Right-handed   Drinks 2 cups of coffee weekly    3 bottle water per day   Social Drivers of Health   Financial Resource Strain: Patient Declined (12/11/2023)   Received from [facility]   Overall Financial Resource Strain (CARDIA)    Difficulty of Paying Living Expenses: Patient declined  Food Insecurity: Patient Declined (12/11/2023)   Received from [facility]   Hunger Vital Sign    Worried About Running  Out of Food in the Last Year: Patient declined    Ran Out of Food in the Last Year: Patient declined  Transportation Needs: Patient Declined (12/11/2023)   Received from [facility]   PRAPARE - Transportation    Lack of Transportation (Medical): Patient declined    Lack of Transportation (Non-Medical): Patient declined  Physical Activity: Insufficiently Active (04/08/2023)   Exercise Vital Sign    Days of Exercise per Week: 3 days    Minutes of Exercise per Session: 10 min  Stress: No Stress Concern Present (04/08/2023)   [employer] of Occupational Health - Occupational Stress Questionnaire    Feeling of Stress : Only a little  Social Connections: Socially Integrated (04/08/2023)   Social Connection and Isolation Panel [NHANES]    Frequency of Communication with Friends and Family: More than three times a week    Frequency of Social Gatherings with Friends and Family: Once a week    Attends Religious Services: More than 4 times per year    Active Member of [employer] or Organizations: Yes    Attends Engineer, Structural: More than 4 times per year    Marital Status: Married  Catering Manager Violence: Not At Risk (11/17/2023)   Received from [facility]   HITS    Over the last 12 months how often did your partner physically hurt you?: Never    Over the last 12 months how often did your partner insult you or talk down to you?: Never    Over the last 12 months how often did your partner threaten you with physical harm?: Never    Over the last 12 months how often did your partner scream or curse at you?: Never    FAMILY HISTORY: Her family history includes Alcohol abuse in her son; Anxiety disorder in her son; Arthritis in her mother; Asthma in her father and son; Cancer in her maternal aunt, maternal grandfather, maternal grandmother, maternal uncle, and mother; Colon cancer in her maternal grandfather and maternal uncle; Diabetes in her brother, brother, father, mother, and  son; Hypertension in her mother; Schizophrenia in her son; Stomach cancer in her mother.  CURRENT MEDICATIONS   Current Outpatient Medications  Medication Instructions   albuterol  (VENTOLIN  HFA) 108 (90 Base) MCG/ACT inhaler 2 puffs, Inhalation, Every 6 hours PRN   amitriptyline  (ELAVIL ) 50 mg, Oral, Daily at bedtime   apixaban  (ELIQUIS ) 5 mg, Oral, 2 times daily   buPROPion  (WELLBUTRIN  XL) 150 MG 24 hr tablet TAKE 1 TABLET BY MOUTH  DAILY   buPROPion  (WELLBUTRIN  XL) 300 mg, Oral, Every morning   busPIRone  (BUSPAR ) 30 mg, Oral, 2 times daily   cetirizine  (ZYRTEC ) 10 MG tablet TAKE 1 TABLET(10 MG) BY MOUTH DAILY   clonazePAM  (KLONOPIN ) 0.5 MG tablet TAKE ONE TABLET BY MOUTH EVERY NIGHT AT BEDTIME   COMIRNATY syringe 0.3 mLs,  Once   Cyanocobalamin  (VITAMIN B-12 PO) Take by  mouth.   diclofenac  Sodium (VOLTAREN ) 4 g, Topical, 4 times daily   DULoxetine  (CYMBALTA ) 60 MG capsule TAKE 1 CAPSULE DAILY FOR 2 WEEKS AND THEN INCREASE TO 1 CAPSULE TWICE DAILY   meloxicam  (MOBIC ) 15 mg, Oral, Daily   Multiple Vitamin (MULTIVITAMIN) capsule 1 capsule, Daily   SUMAtriptan  (IMITREX ) 50 mg, Oral, Every 2 hours PRN, May repeat in 2 hours if headache persists or recurs.   traMADol  (ULTRAM ) 100 mg, Oral, 3 times daily   triamcinolone  ointment (KENALOG ) 0.1 % 1 Application, Topical, 2 times daily   VITAMIN E  PO Take by mouth.     ALLERGIES  She is allergic to aspirin and bee venom.  REVIEW OF SYSTEMS:  Review of Systems - Oncology   Rest of the pertinent review of systems is unremarkable except as mentioned above in HPI.  PHYSICAL EXAMINATION:  ECOG PERFORMANCE STATUS: 1 - Symptomatic but completely ambulatory  Vitals:   12/18/23 1423  BP: (!) 141/76  Pulse: 79  Resp: 16  Temp: (!) 97.3 F (36.3 C)  SpO2: 100%   Filed Weights   12/18/23 1423  Weight: 212 lb 8 oz (96.4 kg)    Physical Exam Constitutional:      General: She is not in acute distress.    Appearance: Normal appearance.   HENT:     Head: Normocephalic and atraumatic.  Eyes:     General: No scleral icterus.    Conjunctiva/sclera: Conjunctivae normal.  Cardiovascular:     Rate and Rhythm: Normal rate and regular rhythm.     Heart sounds: Normal heart sounds.  Pulmonary:     Effort: Pulmonary effort is normal.     Breath sounds: Normal breath sounds.  Abdominal:     General: There is no distension.  Musculoskeletal:     Right lower leg: No edema.     Left lower leg: No edema.  Neurological:     General: No focal deficit present.     Mental Status: She is alert and oriented to person, place, and time.  Psychiatric:        Mood and Affect: Mood normal.        Behavior: Behavior normal.        Thought Content: Thought content normal.      LABORATORY DATA:   I have reviewed the data as listed.  Results for orders placed or performed in visit on 12/18/23  Lupus anticoagulant panel  Result Value Ref Range   PTT Lupus Anticoagulant 38.1 0.0 - 43.5 sec   DRVVT 45.2 0.0 - 47.0 sec   Lupus Anticoag Interp Comment:   D-dimer, quantitative  Result Value Ref Range   D-Dimer, Quant <0.27 0.00 - 0.50 ug/mL-FEU  Comprehensive metabolic panel  Result Value Ref Range   Sodium 142 135 - 145 mmol/L   Potassium 4.1 3.5 - 5.1 mmol/L   Chloride 107 98 - 111 mmol/L   CO2 30 22 - 32 mmol/L   Glucose, Bld 97 70 - 99 mg/dL   BUN 14 8 - 23 mg/dL   Creatinine, Ser 9.06 0.44 - 1.00 mg/dL   Calcium 89.6 8.9 - 89.6 mg/dL   Total Protein 7.6 6.5 - 8.1 g/dL   Albumin 4.4 3.5 - 5.0 g/dL   AST 14 (L) 15 - 41 U/L   ALT 10 0 - 44 U/L   Alkaline Phosphatase 80 38 - 126 U/L   Total Bilirubin 0.5 0.0 - 1.2 mg/dL   GFR, Estimated >39 >39 mL/min  Anion gap 5 5 - 15  CBC with Differential/Platelet  Result Value Ref Range   WBC 3.8 (L) 4.0 - 10.5 K/uL   RBC 4.61 3.87 - 5.11 MIL/uL   Hemoglobin 13.7 12.0 - 15.0 g/dL   HCT 56.9 63.9 - 53.9 %   MCV 93.3 80.0 - 100.0 fL   MCH 29.7 26.0 - 34.0 pg   MCHC 31.9 30.0 -  36.0 g/dL   RDW 87.5 88.4 - 84.4 %   Platelets 258 150 - 400 K/uL   nRBC 0.0 0.0 - 0.2 %   Neutrophils Relative % 72 %   Neutro Abs 2.7 1.7 - 7.7 K/uL   Lymphocytes Relative 21 %   Lymphs Abs 0.8 0.7 - 4.0 K/uL   Monocytes Relative 6 %   Monocytes Absolute 0.2 0.1 - 1.0 K/uL   Eosinophils Relative 1 %   Eosinophils Absolute 0.1 0.0 - 0.5 K/uL   Basophils Relative 0 %   Basophils Absolute 0.0 0.0 - 0.1 K/uL   Immature Granulocytes 0 %   Abs Immature Granulocytes 0.01 0.00 - 0.07 K/uL    RADIOGRAPHIC STUDIES:  I reviewed CT angiogram report from 11/17/2023 at outside hospital.  Orders Placed This Encounter  Procedures   CBC with Differential/Platelet    Standing Status:   Future    Expiration Date:   12/17/2024   Comprehensive metabolic panel    Standing Status:   Future    Expiration Date:   12/17/2024   Cardiolipin antibodies, IgG, IgM, IgA    Standing Status:   Future    Expiration Date:   12/17/2024   Beta-2 -glycoprotein i abs, IgG/M/A    Standing Status:   Future    Expiration Date:   12/17/2024   D-dimer, quantitative    Standing Status:   Future    Expiration Date:   12/17/2024   Prothrombin gene mutation    Standing Status:   Future    Expiration Date:   12/17/2024   Factor 5 leiden    Standing Status:   Future    Expiration Date:   12/17/2024   Lupus anticoagulant panel    Standing Status:   Future    Expiration Date:   12/17/2024    Future Appointments  Date Time Provider Department Center  01/29/2024 10:30 AM [facility] None  01/29/2024 11:00 AM Rondalyn Belford, MD [facility] None  03/10/2024  1:40 PM Mahlon Comer BRAVO, MD [facility]  06/25/2024 11:00 AM Dolphus Reiter, MD [facility] None    I spent a total of 55 minutes during this encounter with the patient including review of chart and various tests results, discussions about plan of care and coordination of care plan.  This document was completed utilizing speech recognition software.  Grammatical errors, random word insertions, pronoun errors, and incomplete sentences are an occasional consequence of this system due to software limitations, ambient noise, and hardware issues. Any formal questions or concerns about the content, text or information contained within the body of this dictation should be directly addressed to the provider for clarification.

## 2023-12-19 ENCOUNTER — Encounter: Payer: Self-pay | Admitting: Medical Oncology

## 2023-12-19 ENCOUNTER — Other Ambulatory Visit (HOSPITAL_COMMUNITY): Payer: Self-pay

## 2023-12-19 ENCOUNTER — Other Ambulatory Visit: Payer: Self-pay | Admitting: Medical Oncology

## 2023-12-19 ENCOUNTER — Encounter: Payer: Self-pay | Admitting: Oncology

## 2023-12-19 DIAGNOSIS — R634 Abnormal weight loss: Secondary | ICD-10-CM | POA: Insufficient documentation

## 2023-12-19 DIAGNOSIS — I2699 Other pulmonary embolism without acute cor pulmonale: Secondary | ICD-10-CM

## 2023-12-19 DIAGNOSIS — Z87898 Personal history of other specified conditions: Secondary | ICD-10-CM | POA: Insufficient documentation

## 2023-12-19 LAB — LUPUS ANTICOAGULANT PANEL
DRVVT: 45.2 s (ref 0.0–47.0)
PTT Lupus Anticoagulant: 38.1 s (ref 0.0–43.5)

## 2023-12-19 MED ORDER — APIXABAN 5 MG PO TABS: 5.0000 mg | ORAL_TABLET | Freq: Two times a day (BID) | ORAL | 4 refills | Status: DC

## 2023-12-19 NOTE — Progress Notes (Signed)
 Eloquis rx -Linda Buckley cannot afford to pay for 60 tablets . Pt cannot get to [facility] community pharmacy before closing so I called rx to  [facility] and asked them to  dispense the # number of pills that she can afford at this time.  I suggested pt contact insurance on Monday re her Eloquis rx. I also recommended she go to [facility] Monday and apply for the  Low Income Subsidy aka Extra Help program . She said she will .

## 2023-12-19 NOTE — Assessment & Plan Note (Signed)
 First episode of PE on 11/17/2023 with no clear provoking factors. Shortness of breath and bilateral ankle swelling reported. Family history of blood clots.  -We will obtain hypercoagulable workup with prothrombin gene mutation, factor V Leiden mutation, beta-2  glycoprotein antibodies, anticardiolipin antibodies, lupus anticoagulant.  Rest of the hypercoagulable workup will be deferred since it can be falsely abnormal given recent embolism.  -D-dimer today is undetectable, indicating adequate anticoagulation.  -Patient has had trouble filling her Xarelto  prescription as it was not approved by her insurance and she had to pay out-of-pocket.  We will switch anticoagulation to Eliquis  5 mg p.o. twice daily and hopefully this will be covered by her insurance.  If she has trouble filling the medication, we will enroll her in patient assistance program.  -At the minimum 6 months of anticoagulation is recommended.  -I will see her approximately in 6 weeks to ensure medication compliance and for routine follow-up.

## 2023-12-19 NOTE — Assessment & Plan Note (Addendum)
 Lost 24 pounds over the past 3 months without trying. -CBCD and CMP unremarkable today.  -It is likely from her gastric sleeve surgery.   -If alarming unintentional weight loss, we will consider referral for GI evaluation on return visit.

## 2023-12-20 LAB — BETA-2-GLYCOPROTEIN I ABS, IGG/M/A
Beta-2 Glyco I IgG: <9 GPI IgG units (ref 0–20)
Beta-2-Glycoprotein I IgA: <9 GPI IgA units (ref 0–25)
Beta-2-Glycoprotein I IgM: <9 GPI IgM units (ref 0–32)

## 2023-12-20 LAB — CARDIOLIPIN ANTIBODIES, IGG, IGM, IGA
Anticardiolipin IgA: <9 [APL'U]/mL (ref 0–11)
Anticardiolipin IgG: <9 [GPL'U]/mL (ref 0–14)
Anticardiolipin IgM: <9 [MPL'U]/mL (ref 0–12)

## 2023-12-22 ENCOUNTER — Other Ambulatory Visit: Payer: Self-pay | Admitting: Oncology

## 2023-12-22 ENCOUNTER — Telehealth: Payer: Self-pay

## 2023-12-22 ENCOUNTER — Telehealth: Payer: Self-pay | Admitting: Medical Oncology

## 2023-12-22 DIAGNOSIS — I2699 Other pulmonary embolism without acute cor pulmonale: Secondary | ICD-10-CM

## 2023-12-22 MED ORDER — RIVAROXABAN 20 MG PO TABS: 20.0000 mg | ORAL_TABLET | Freq: Every day | ORAL | 5 refills | Status: DC

## 2023-12-22 NOTE — Progress Notes (Signed)
 Since Eliquis was not covered by her insurance, switched her treatment back to Xarelto 20 mg daily and new prescription sent to [facility] in [place].

## 2023-12-22 NOTE — Telephone Encounter (Addendum)
 Pt called [facility] and they told her they can fill xarelto 1 month for $49. She said [facility] and [facility] "were out of network". Dr. Arlana Pouch is sending in the rx. LVM on pts phone with this information.

## 2023-12-22 NOTE — Telephone Encounter (Signed)
 Notified Patient that Eliquis  did not require a prior authorization through her Insurance plan. Patient states that her co-payment for Eliquis  is more than four hundred dollars. Patient states that she would prefer Coumadin which is cheaper. Advised Patient that this Nurse would notify Provider that she is requesting Coumadin, but advised Patient to contact her Insurance plan and inquire what other prescription medications would be cheaper and if she has to get her medications through mail order as before. Patient verbalized understanding.

## 2023-12-23 ENCOUNTER — Other Ambulatory Visit (HOSPITAL_COMMUNITY): Payer: Self-pay

## 2023-12-23 LAB — PROTHROMBIN GENE MUTATION

## 2023-12-23 LAB — FACTOR 5 LEIDEN

## 2023-12-29 ENCOUNTER — Inpatient Hospital Stay: Payer: Medicare PPO

## 2024-01-07 ENCOUNTER — Other Ambulatory Visit: Payer: Self-pay | Admitting: Sports Medicine

## 2024-01-07 DIAGNOSIS — M797 Fibromyalgia: Secondary | ICD-10-CM

## 2024-01-07 DIAGNOSIS — M47816 Spondylosis without myelopathy or radiculopathy, lumbar region: Secondary | ICD-10-CM

## 2024-01-07 DIAGNOSIS — M1711 Unilateral primary osteoarthritis, right knee: Secondary | ICD-10-CM

## 2024-01-07 DIAGNOSIS — M160 Bilateral primary osteoarthritis of hip: Secondary | ICD-10-CM

## 2024-01-29 ENCOUNTER — Inpatient Hospital Stay: Payer: Medicare PPO | Attending: Oncology

## 2024-01-29 ENCOUNTER — Other Ambulatory Visit: Payer: Self-pay | Admitting: Oncology

## 2024-01-29 ENCOUNTER — Inpatient Hospital Stay (HOSPITAL_BASED_OUTPATIENT_CLINIC_OR_DEPARTMENT_OTHER): Payer: Medicare PPO | Admitting: Oncology

## 2024-01-29 ENCOUNTER — Encounter: Payer: Self-pay | Admitting: Oncology

## 2024-01-29 VITALS — BP 133/94 | HR 66 | Temp 98.2°F | Resp 17 | Ht 66.0 in | Wt 207.3 lb

## 2024-01-29 DIAGNOSIS — M25472 Effusion, left ankle: Secondary | ICD-10-CM | POA: Diagnosis not present

## 2024-01-29 DIAGNOSIS — D72819 Decreased white blood cell count, unspecified: Secondary | ICD-10-CM | POA: Diagnosis not present

## 2024-01-29 DIAGNOSIS — Z791 Long term (current) use of non-steroidal anti-inflammatories (NSAID): Secondary | ICD-10-CM | POA: Diagnosis not present

## 2024-01-29 DIAGNOSIS — R630 Anorexia: Secondary | ICD-10-CM | POA: Diagnosis not present

## 2024-01-29 DIAGNOSIS — I2699 Other pulmonary embolism without acute cor pulmonale: Secondary | ICD-10-CM

## 2024-01-29 DIAGNOSIS — M25471 Effusion, right ankle: Secondary | ICD-10-CM | POA: Diagnosis not present

## 2024-01-29 DIAGNOSIS — R634 Abnormal weight loss: Secondary | ICD-10-CM | POA: Diagnosis not present

## 2024-01-29 DIAGNOSIS — R079 Chest pain, unspecified: Secondary | ICD-10-CM | POA: Diagnosis not present

## 2024-01-29 DIAGNOSIS — Z7901 Long term (current) use of anticoagulants: Secondary | ICD-10-CM | POA: Insufficient documentation

## 2024-01-29 DIAGNOSIS — Z79899 Other long term (current) drug therapy: Secondary | ICD-10-CM | POA: Insufficient documentation

## 2024-01-29 DIAGNOSIS — R197 Diarrhea, unspecified: Secondary | ICD-10-CM | POA: Diagnosis not present

## 2024-01-29 LAB — D-DIMER, QUANTITATIVE: D-Dimer, Quant: <0.27 ug{FEU}/mL (ref 0.00–0.50)

## 2024-01-29 LAB — CBC WITH DIFFERENTIAL (CANCER CENTER ONLY)
Abs Immature Granulocytes: 0 10*3/uL (ref 0.00–0.07)
Basophils Absolute: 0 10*3/uL (ref 0.0–0.1)
Basophils Relative: 0 %
Eosinophils Absolute: 0.1 10*3/uL (ref 0.0–0.5)
Eosinophils Relative: 2 %
HCT: 40.5 % (ref 36.0–46.0)
Hemoglobin: 12.8 g/dL (ref 12.0–15.0)
Immature Granulocytes: 0 %
Lymphocytes Relative: 31 %
Lymphs Abs: 1.1 10*3/uL (ref 0.7–4.0)
MCH: 29.2 pg (ref 26.0–34.0)
MCHC: 31.6 g/dL (ref 30.0–36.0)
MCV: 92.3 fL (ref 80.0–100.0)
Monocytes Absolute: 0.3 10*3/uL (ref 0.1–1.0)
Monocytes Relative: 9 %
Neutro Abs: 2.2 10*3/uL (ref 1.7–7.7)
Neutrophils Relative %: 58 %
Platelet Count: 211 10*3/uL (ref 150–400)
RBC: 4.39 MIL/uL (ref 3.87–5.11)
RDW: 12.2 % (ref 11.5–15.5)
WBC Count: 3.7 10*3/uL — ABNORMAL LOW (ref 4.0–10.5)
nRBC: 0 % (ref 0.0–0.2)

## 2024-01-29 MED ORDER — ALBUTEROL SULFATE HFA 108 (90 BASE) MCG/ACT IN AERS: 2.0000 | INHALATION_SPRAY | Freq: Four times a day (QID) | RESPIRATORY_TRACT | 1 refills | Status: AC | PRN

## 2024-01-29 MED ORDER — APIXABAN 5 MG PO TABS: 5.0000 mg | ORAL_TABLET | Freq: Two times a day (BID) | ORAL | 4 refills | Status: DC

## 2024-01-29 NOTE — Assessment & Plan Note (Signed)
Lost 24 pounds over the past 3 months without trying. -CBCD and CMP unremarkable  -It is likely from her gastric sleeve surgery.   -If alarming unintentional weight loss, we will consider referral for GI evaluation on return visit.

## 2024-01-29 NOTE — Progress Notes (Signed)
[facility]  HEMATOLOGY CLINIC PROGRESS NOTE  PATIENT NAME: Linda [facility]   MR#: [id number] DOB: 1956-04-10  Patient Care Team: Sheliah Hatch, MD as PCP - General (Family Medicine) Bradly Bienenstock, MD as Consulting Physician (Orthopedic Surgery) Sheran Luz, MD as Consulting Physician (Physical Medicine and Rehabilitation) Venita Lick, MD as Consulting Physician (Orthopedic Surgery) Rachael Fee, MD (Inactive) as Attending Physician (Gastroenterology) York Spaniel, MD (Inactive) as Consulting Physician (Neurology) Iran Ouch, Lennart Pall, PA-C as Physician Assistant (Physician Assistant) Butch Penny, NP as Registered Nurse (Gerontology) Arlyn Leak, PA-C as Physician Assistant (Orthopedic Surgery) Dahlia Byes, [facility] (Inactive) as Pharmacist (Pharmacist)  Date of visit: 01/29/2024   ASSESSMENT & PLAN:   Linda [facility] is a 68 y.o. lady with a past medical history of hypertension, sleep apnea, fibromyalgia, depression, gastric sleeve surgery, was referred to our service in January 2025 for evaluation of possible hypercoagulable state after she was diagnosed with pulmonary embolism on 11/17/2023.   PE (pulmonary thromboembolism) (HCC) First episode of PE on 11/17/2023 with no clear provoking factors. Shortness of breath and bilateral ankle swelling reported. Family history of blood clots.  On her initial consultation with [name] on 12/18/2023, we obtained hypercoagulable workup. Prothrombin gene mutation, factor V Leiden mutation, beta-2 glycoprotein antibodies, anticardiolipin antibodies, lupus anticoagulant were all negative.  D-dimer was undetectable.  Rest of the hypercoagulable workup was deferred since it can be falsely abnormal given recent embolism.    Patient has had trouble filling her Xarelto prescription as it was not approved by her insurance and she had to pay out-of-pocket.  We switched her to Eliquis 5 mg p.o. twice daily and this was  covered by her insurance.  D-dimer remains undetectable today. CBCD unremarkable except for chronic mild leukopenia with white count of 3700, normal differential.  At the minimum 6 months of anticoagulation is recommended.  I will see her in mid June 2025 for follow-up and we will consider repeating CT angiogram of the chest, if patient has concerning symptoms.  Will also pursue remaining hypercoagulable workup on return visit, with protein C activity, protein S activity, Antithrombin III activity.  She is up-to-date on age-appropriate cancer screening.  Weight loss Lost 24 pounds over the past 3 months without trying. -CBCD and CMP unremarkable  -It is likely from her gastric sleeve surgery.   -If alarming unintentional weight loss, we will consider referral for GI evaluation on return visit.   I spent a total of 25 minutes during this encounter with the patient including review of chart and various tests results, discussions about plan of care and coordination of care plan.  I reviewed lab results and outside records for this visit and discussed relevant results with the patient. Diagnosis, plan of care and treatment options were also discussed in detail with the patient. Opportunity provided to ask questions and answers provided to her apparent satisfaction. Provided instructions to call our clinic with any problems, questions or concerns prior to return visit. I recommended to continue follow-up with PCP and sub-specialists. She verbalized understanding and agreed with the plan. No barriers to learning was detected.  Linda Crutch, MD  01/29/2024 2:57 PM  [facility] - A DEPT OF [facility] [address] [place] [address] Dept: [phone] Dept Fax: [phone]   CHIEF COMPLAINT/ REASON FOR VISIT:  Follow-up for history of unprovoked pulmonary embolism in December 2024.  Grossly negative hypercoagulable  workup.  INTERVAL HISTORY:  Discussed the use of AI scribe software for clinical note transcription with the patient, who gave verbal consent to proceed.   Patient returns for follow-up to clinic today.  She is currently on Eliquis twice daily. She reports that the medication is affordable and manageable with her insurance. She is scheduled to continue the blood thinner until the end of June. She reports occasional chest pain and shortness of breath, particularly when walking. She also reports a lack of appetite and constantly feeling cold, particularly in her hands and feet. She has a history of anemia but is not currently anemic. She also reports using an inhaler for shortness of breath, which she finds helpful.  SUMMARY OF HEMATOLOGIC HISTORY:  The patient was admitted to the [facility] on 11/17/2023 after experiencing shortness of breath and a lingering cold.  CT angiogram showed pulmonary embolism in the right lung with most proximal thrombus at lobar pulmonary artery level.  No evidence of right heart strain.  She was initially on heparin drip and was later started on Xarelto.   The patient denies any recent surgeries, prolonged bed rest, or long trips. There is a family history of a blood clot in the patient's father. The patient reports bilateral ankle swelling.   In addition to the pulmonary embolism, the patient reports unintentional weight loss of 24 pounds over the past two months without any changes in diet or exercise.  It is presumed to be from her gastric sleeve procedure.  The patient also reports changes in bowel habits, with increased frequency and loose stools over the past two weeks. The patient denies any chest pain or acid reflux symptoms.   First episode of PE on 11/17/2023 with no clear provoking factors. Shortness of breath and bilateral ankle swelling reported. Family history of blood clots.  On her initial consultation with [name] on 12/18/2023, we obtained hypercoagulable  workup. Prothrombin gene mutation, factor V Leiden mutation, beta-2 glycoprotein antibodies, anticardiolipin antibodies, lupus anticoagulant were all negative.  D-dimer was undetectable.  Rest of the hypercoagulable workup was deferred since it can be falsely abnormal given recent embolism.    Patient has had trouble filling her Xarelto prescription as it was not approved by her insurance and she had to pay out-of-pocket.  We switched her to Eliquis 5 mg p.o. twice daily and this was covered by her insurance.  At the minimum 6 months of anticoagulation is recommended.  She is up-to-date on age-appropriate cancer screening.   I have reviewed the past medical history, past surgical history, social history and family history with the patient and they are unchanged from previous note.  ALLERGIES: She is allergic to aspirin and bee venom.  MEDICATIONS:  Current Outpatient Medications  Medication Sig Dispense Refill   amitriptyline (ELAVIL) 50 MG tablet Take 1 tablet (50 mg total) by mouth at bedtime. 90 tablet 3   apixaban (ELIQUIS) 5 MG TABS tablet Take 1 tablet (5 mg total) by mouth 2 (two) times daily. 60 tablet 4   buPROPion (WELLBUTRIN XL) 150 MG 24 hr tablet TAKE 1 TABLET BY MOUTH  DAILY 90 tablet 3   buPROPion (WELLBUTRIN XL) 300 MG 24 hr tablet Take 1 tablet (300 mg total) by mouth every morning. 90 tablet 3   busPIRone (BUSPAR) 30 MG tablet Take 1 tablet (30 mg total) by mouth in the morning and at bedtime. 60 tablet 3   cetirizine (ZYRTEC) 10 MG tablet TAKE 1 TABLET(10 MG) BY MOUTH DAILY 30 tablet 0  clonazePAM (KLONOPIN) 0.5 MG tablet TAKE ONE TABLET BY MOUTH EVERY NIGHT AT BEDTIME 30 tablet 3   Cyanocobalamin (VITAMIN B-12 PO) Take by mouth.     diclofenac Sodium (VOLTAREN) 1 % GEL Apply 4 g topically 4 (four) times daily. 100 g 3   DULoxetine (CYMBALTA) 60 MG capsule TAKE 1 CAPSULE DAILY FOR 2 WEEKS AND THEN INCREASE TO 1 CAPSULE TWICE DAILY 180 capsule 3   meloxicam (MOBIC) 15 MG  tablet Take 1 tablet (15 mg total) by mouth daily. 30 tablet 11   Multiple Vitamin (MULTIVITAMIN) capsule Take 1 capsule by mouth daily. With iron     SUMAtriptan (IMITREX) 50 MG tablet Take 1 tablet (50 mg total) by mouth every 2 (two) hours as needed for migraine. May repeat in 2 hours if headache persists or recurs. 10 tablet 6   traMADol (ULTRAM) 50 MG tablet Take 2 tablets (100 mg total) by mouth in the morning, at noon, and at bedtime. 180 tablet 0   VITAMIN E PO Take by mouth.     albuterol (VENTOLIN HFA) 108 (90 Base) MCG/ACT inhaler Inhale 2 puffs into the lungs every 6 (six) hours as needed for wheezing or shortness of breath. 8 g 1   COMIRNATY syringe Inject 0.3 mLs into the muscle once. (Patient not taking: Reported on 01/29/2024)     No current facility-administered medications for this visit.     REVIEW OF SYSTEMS:    ROS  All other pertinent systems were reviewed with the patient and are negative.  PHYSICAL EXAMINATION:   Onc Performance Status - 01/29/24 1218       ECOG Perf Status   ECOG Perf Status Restricted in physically strenuous activity but ambulatory and able to carry out work of a light or sedentary nature, e.g., light house work, office work      KPS SCALE   KPS % SCORE Able to carry on normal activity, minor s/s of disease             Vitals:   01/29/24 1211 01/29/24 1218  BP: (!) 144/85 (!) 133/94  Pulse: (!) 58 66  Resp: 18 17  Temp: 98.2 F (36.8 C)   SpO2: 95%    Filed Weights   01/29/24 1211  Weight: 207 lb 4.8 oz (94 kg)    Physical Exam Constitutional:      General: She is not in acute distress.    Appearance: Normal appearance.  HENT:     Head: Normocephalic and atraumatic.  Eyes:     General: No scleral icterus.    Conjunctiva/sclera: Conjunctivae normal.  Cardiovascular:     Rate and Rhythm: Normal rate and regular rhythm.     Heart sounds: Normal heart sounds.  Pulmonary:     Effort: Pulmonary effort is normal.      Breath sounds: Normal breath sounds.  Abdominal:     General: There is no distension.  Musculoskeletal:     Right lower leg: No edema.     Left lower leg: No edema.  Neurological:     General: No focal deficit present.     Mental Status: She is alert and oriented to person, place, and time.  Psychiatric:        Mood and Affect: Mood normal.        Behavior: Behavior normal.        Thought Content: Thought content normal.     LABORATORY DATA:   I have reviewed the data as listed.  Results for orders placed or performed in visit on 01/29/24  D-dimer, quantitative  Result Value Ref Range   D-Dimer, Quant <0.27 0.00 - 0.50 ug/mL-FEU  CBC with Differential (Cancer Center Only)  Result Value Ref Range   WBC Count 3.7 (L) 4.0 - 10.5 K/uL   RBC 4.39 3.87 - 5.11 MIL/uL   Hemoglobin 12.8 12.0 - 15.0 g/dL   HCT 78.2 95.6 - 21.3 %   MCV 92.3 80.0 - 100.0 fL   MCH 29.2 26.0 - 34.0 pg   MCHC 31.6 30.0 - 36.0 g/dL   RDW 08.6 57.8 - 46.9 %   Platelet Count 211 150 - 400 K/uL   nRBC 0.0 0.0 - 0.2 %   Neutrophils Relative % 58 %   Neutro Abs 2.2 1.7 - 7.7 K/uL   Lymphocytes Relative 31 %   Lymphs Abs 1.1 0.7 - 4.0 K/uL   Monocytes Relative 9 %   Monocytes Absolute 0.3 0.1 - 1.0 K/uL   Eosinophils Relative 2 %   Eosinophils Absolute 0.1 0.0 - 0.5 K/uL   Basophils Relative 0 %   Basophils Absolute 0.0 0.0 - 0.1 K/uL   Immature Granulocytes 0 %   Abs Immature Granulocytes 0.00 0.00 - 0.07 K/uL    RADIOGRAPHIC STUDIES:  No recent pertinent imaging studies available to review.  Orders Placed This Encounter  Procedures   Protein C activity    Standing Status:   Future    Expected Date:   05/28/2024    Expiration Date:   01/28/2025   PROTEIN S PANEL    Standing Status:   Future    Expected Date:   05/28/2024    Expiration Date:   01/28/2025   Antithrombin panel    Standing Status:   Future    Expected Date:   05/28/2024    Expiration Date:   01/28/2025   CBC with Differential  (Cancer Center Only)    Standing Status:   Future    Expected Date:   05/28/2024    Expiration Date:   01/28/2025   D-dimer, quantitative    Standing Status:   Future    Expected Date:   05/28/2024    Expiration Date:   01/28/2025     Future Appointments  Date Time Provider Department Center  03/10/2024  1:40 PM Sheliah Hatch, MD [facility]  05/28/2024 10:30 AM [facility] None  05/28/2024 11:00 AM Tarhonda Hollenberg, Archie Patten, MD [facility] None  06/25/2024 11:00 AM Pollyann Savoy, MD [facility] None     This document was completed utilizing speech recognition software. Grammatical errors, random word insertions, pronoun errors, and incomplete sentences are an occasional consequence of this system due to software limitations, ambient noise, and hardware issues. Any formal questions or concerns about the content, text or information contained within the body of this dictation should be directly addressed to the provider for clarification.

## 2024-01-29 NOTE — Assessment & Plan Note (Addendum)
First episode of PE on 11/17/2023 with no clear provoking factors. Shortness of breath and bilateral ankle swelling reported. Family history of blood clots.  On her initial consultation with [name] on 12/18/2023, we obtained hypercoagulable workup. Prothrombin gene mutation, factor V Leiden mutation, beta-2 glycoprotein antibodies, anticardiolipin antibodies, lupus anticoagulant were all negative.  D-dimer was undetectable.  Rest of the hypercoagulable workup was deferred since it can be falsely abnormal given recent embolism.    Patient has had trouble filling her Xarelto prescription as it was not approved by her insurance and she had to pay out-of-pocket.  We switched her to Eliquis 5 mg p.o. twice daily and this was covered by her insurance.  D-dimer remains undetectable today. CBCD unremarkable except for chronic mild leukopenia with white count of 3700, normal differential.  At the minimum 6 months of anticoagulation is recommended.  I will see her in mid June 2025 for follow-up and we will consider repeating CT angiogram of the chest, if patient has concerning symptoms.  Will also pursue remaining hypercoagulable workup on return visit, with protein C activity, protein S activity, Antithrombin III activity.  She is up-to-date on age-appropriate cancer screening.

## 2024-02-02 ENCOUNTER — Ambulatory Visit: Payer: Self-pay | Admitting: Family Medicine

## 2024-02-02 ENCOUNTER — Encounter: Payer: Self-pay | Admitting: Family Medicine

## 2024-02-02 ENCOUNTER — Ambulatory Visit (INDEPENDENT_AMBULATORY_CARE_PROVIDER_SITE_OTHER): Payer: Medicare PPO | Admitting: Family Medicine

## 2024-02-02 VITALS — BP 122/84 | HR 94 | Temp 98.4°F | Wt 205.8 lb

## 2024-02-02 DIAGNOSIS — R509 Fever, unspecified: Secondary | ICD-10-CM | POA: Diagnosis not present

## 2024-02-02 DIAGNOSIS — B9689 Other specified bacterial agents as the cause of diseases classified elsewhere: Secondary | ICD-10-CM | POA: Diagnosis not present

## 2024-02-02 DIAGNOSIS — J329 Chronic sinusitis, unspecified: Secondary | ICD-10-CM | POA: Diagnosis not present

## 2024-02-02 LAB — POCT INFLUENZA A/B
Influenza A, POC: NEGATIVE
Influenza B, POC: NEGATIVE

## 2024-02-02 MED ORDER — DOXYCYCLINE HYCLATE 100 MG PO TABS: 100.0000 mg | ORAL_TABLET | Freq: Two times a day (BID) | ORAL | 0 refills | Status: DC

## 2024-02-02 MED ORDER — PROMETHAZINE-DM 6.25-15 MG/5ML PO SYRP: 5.0000 mL | ORAL_SOLUTION | Freq: Four times a day (QID) | ORAL | 0 refills | Status: DC | PRN

## 2024-02-02 NOTE — Progress Notes (Signed)
   Subjective:    Patient ID: Linda [facility], female    DOB: November 04, 1956, 68 y.o.   MRN: [id number]  HPI Cough- sxs started 6 days ago.  Had 1 day of fever.  Cough is productive of yellow sputum that has been blood tinged.  + body aches and HA.  + flu exposure.  + frontal sinus pressure.  + sick contacts.  + SOB.  No wheezing.  + fatigue.   Review of Systems For ROS see HPI     Objective:   Physical Exam Vitals reviewed.  Constitutional:      General: She is not in acute distress.    Appearance: Normal appearance. She is well-developed. She is not ill-appearing.  HENT:     Head: Normocephalic and atraumatic.     Right Ear: Tympanic membrane normal.     Left Ear: Tympanic membrane normal.     Nose: Mucosal edema and congestion present. No rhinorrhea.     Right Sinus: Maxillary sinus tenderness and frontal sinus tenderness present.     Left Sinus: Maxillary sinus tenderness and frontal sinus tenderness present.     Mouth/Throat:     Pharynx: Uvula midline. Posterior oropharyngeal erythema present. No oropharyngeal exudate.  Eyes:     Conjunctiva/sclera: Conjunctivae normal.     Pupils: Pupils are equal, round, and reactive to light.  Cardiovascular:     Rate and Rhythm: Normal rate and regular rhythm.     Heart sounds: Normal heart sounds.  Pulmonary:     Effort: Pulmonary effort is normal. No respiratory distress.     Breath sounds: Normal breath sounds. No wheezing.  Musculoskeletal:     Cervical back: Normal range of motion and neck supple.  Lymphadenopathy:     Cervical: No cervical adenopathy.  Skin:    General: Skin is warm and dry.  Neurological:     General: No focal deficit present.     Mental Status: She is alert and oriented to person, place, and time.     Cranial Nerves: No cranial nerve deficit.     Motor: No weakness.     Coordination: Coordination normal.  Psychiatric:        Mood and Affect: Mood normal.        Behavior: Behavior normal.        Thought  Content: Thought content normal.           Assessment & Plan:  Bacterial sinusitis- new.  Pt's sxs and PE consistent w/ infxn.  Start Doxycycline BID.  Cough meds prn.  Reviewed supportive care and red flags that should prompt return.  Pt expressed understanding and is in agreement w/ plan.

## 2024-02-02 NOTE — Telephone Encounter (Signed)
Pt was seen 02/02/2024

## 2024-02-02 NOTE — Patient Instructions (Signed)
Follow up as needed or as scheduled START the Doxycycline twice daily- take w/ food Drink LOTS of fluids REST! Tylenol as needed for headache or fever USE the cough syrup as needed Call with any questions or concerns Hang in there!

## 2024-02-02 NOTE — Telephone Encounter (Signed)
Copied from CRM [id number]. Topic: Clinical - Red Word Triage >> Feb 02, 2024  7:47 AM Theodis Sato wrote: Red Word that prompted transfer to Nurse Triage: chest pain with cough, cough, yellow mucus , headache fever, sore throat.  Chief Complaint: cough Symptoms:  Chest pain only when coughing, headache around right eye/temple, sore throat, and yellow mucus, Frequency: x 1 week and worsening Pertinent Negatives: Patient denies fever Disposition: [] ED /[] Urgent Care (no appt availability in office) / [x] Appointment(In office/virtual)/ []  [facility]/ [] Home Care/ [] Refused Recommended Disposition /[] [facility]/ []  Follow-up with PCP Additional Notes: pt has a history of fibromylasia &bilateral legs & arms hurt.  SOB only when walking and coughing at same time.  Reason for Disposition  [1] MILD difficulty breathing (e.g., minimal/no SOB at rest, SOB with walking, pulse <100) AND [2] still present when not coughing  [1] SEVERE pain AND [2] not improved 2 hours after pain medicine  Answer Assessment - Initial Assessment Questions 1. ONSET: "When did the cough begin?"      X 1 week 2. SEVERITY: "How bad is the cough today?"      severe 3. SPUTUM: "Describe the color of your sputum" (none, dry cough; clear, white, yellow, green)     Yellow  4. HEMOPTYSIS: "Are you coughing up any blood?" If so ask: "How much?" (flecks, streaks, tablespoons, etc.)     no 5. DIFFICULTY BREATHING: "Are you having difficulty breathing?" If Yes, ask: "How bad is it?" (e.g., mild, moderate, severe)    - MILD: No SOB at rest, mild SOB with walking, speaks normally in sentences, can lie down, no retractions, pulse < 100.    - MODERATE: SOB at rest, SOB with minimal exertion and prefers to sit, cannot lie down flat, speaks in phrases, mild retractions, audible wheezing, pulse 100-120.    - SEVERE: Very SOB at rest, speaks in single words, struggling to breathe, sitting hunched forward, retractions,  pulse > 120      SOB only when coughing and walking together 6. FEVER: "Do you have a fever?" If Yes, ask: "What is your temperature, how was it measured, and when did it start?"     no 7. CARDIAC HISTORY: "Do you have any history of heart disease?" (e.g., heart attack, congestive heart failure)      no 8. LUNG HISTORY: "Do you have any history of lung disease?"  (e.g., pulmonary embolus, asthma, emphysema)     Blood clots on left lung- being treat 9. PE RISK FACTORS: "Do you have a history of blood clots?" (or: recent major surgery, recent prolonged travel, bedridden)     N/a 10. OTHER SYMPTOMS: "Do you have any other symptoms?" (e.g., runny nose, wheezing, chest pain)       Chest pain middle chest only when coughing 8/10-  right side of jaw/tooth ache started Thursday, runny nose, yellow mucus, some blood x1 blowing nose, sore throat, headache- right eye/temple area, cough 11. PREGNANCY: "Is there any chance you are pregnant?" "When was your last menstrual period?"       N/a 12. TRAVEL: "Have you traveled out of the country in the last month?" (e.g., travel history, exposures)       N/a  Answer Assessment - Initial Assessment Questions 1. LOCATION: "Where does it hurt?"      Headache around right eye and temple area 2. ONSET: "When did the sinus pain start?"  (e.g., hours, days)      X 1 week 3.  SEVERITY: "How bad is the pain?"   (Scale 1-10; mild, moderate or severe)   - MILD (1-3): doesn't interfere with normal activities    - MODERATE (4-7): interferes with normal activities (e.g., work or school) or awakens from sleep   - SEVERE (8-10): excruciating pain and patient unable to do any normal activities        moderate 4. RECURRENT SYMPTOM: "Have you ever had sinus problems before?" If Yes, ask: "When was the last time?" and "What happened that time?"      N/a 5. NASAL CONGESTION: "Is the nose blocked?" If Yes, ask: "Can you open it or must you breathe through your mouth?"     Runny  nose , has chest congestion and coughing up yellow sputum 6. NASAL DISCHARGE: "Do you have discharge from your nose?" If so ask, "What color?"    X1 blood when blowing nose 7. FEVER: "Do you have a fever?" If Yes, ask: "What is it, how was it measured, and when did it start?"      no 8. OTHER SYMPTOMS: "Do you have any other symptoms?" (e.g., sore throat, cough, earache, difficulty breathing)     Sore throat, cough, SOB when walking and coughing at same time, 9. PREGNANCY: "Is there any chance you are pregnant?" "When was your last menstrual period?"     N/a  Protocols used: Cough - Acute Productive-A-AH, Sinus Pain or Congestion-A-AH

## 2024-02-09 ENCOUNTER — Other Ambulatory Visit (INDEPENDENT_AMBULATORY_CARE_PROVIDER_SITE_OTHER): Payer: Self-pay

## 2024-02-09 ENCOUNTER — Ambulatory Visit (INDEPENDENT_AMBULATORY_CARE_PROVIDER_SITE_OTHER): Payer: Medicare PPO | Admitting: Sports Medicine

## 2024-02-09 DIAGNOSIS — M1711 Unilateral primary osteoarthritis, right knee: Secondary | ICD-10-CM

## 2024-02-09 NOTE — Assessment & Plan Note (Signed)
 Pleasant 71 female returns, we have been treating her longitudinally for right knee osteoarthritis, she finished a series of Orthovisc early in 2023 and did well until now, having recurrence of pain, we will do a steroid injection and if insufficient long-term relief we will consider repeat viscosupplementation.

## 2024-02-09 NOTE — Progress Notes (Signed)
    Procedures performed today:    Procedure: Real-time Ultrasound Guided injection of the right knee Device: Samsung HS60  Verbal informed consent obtained.  Time-out conducted.  Noted no overlying erythema, induration, or other signs of local infection.  Skin prepped in a sterile fashion.  Local anesthesia: Topical Ethyl chloride.  With sterile technique and under real time ultrasound guidance: Trace effusion, 1 cc Kenalog 40, 2 cc lidocaine, 2 cc bupivacaine injected easily Completed without difficulty  Advised to call if fevers/chills, erythema, induration, drainage, or persistent bleeding.  Images permanently stored and available for review in PACS.  Impression: Technically successful ultrasound guided injection.  Independent interpretation of notes and tests performed by another provider:   None.  Brief History, Exam, Impression, and Recommendations:    Primary osteoarthritis of right knee Pleasant 64 female returns, we have been treating her longitudinally for right knee osteoarthritis, she finished a series of Orthovisc early in 2023 and did well until now, having recurrence of pain, we will do a steroid injection and if insufficient long-term relief we will consider repeat viscosupplementation.    ____________________________________________ Ihor Austin. Benjamin Stain, M.D., ABFM., CAQSM., AME. Primary Care and Sports Medicine [facility]  Adjunct Professor of Family Medicine  [employer]  Restaurant manager, fast food

## 2024-02-23 ENCOUNTER — Other Ambulatory Visit: Payer: Self-pay

## 2024-03-02 ENCOUNTER — Other Ambulatory Visit: Payer: Self-pay

## 2024-03-03 MED ORDER — CLONAZEPAM 0.5 MG PO TABS: ORAL_TABLET | ORAL | 3 refills | Status: DC

## 2024-03-10 ENCOUNTER — Encounter: Payer: Medicare PPO | Admitting: Family Medicine

## 2024-03-23 ENCOUNTER — Other Ambulatory Visit: Payer: Self-pay

## 2024-03-23 MED ORDER — BUSPIRONE HCL 30 MG PO TABS: 30.0000 mg | ORAL_TABLET | Freq: Two times a day (BID) | ORAL | 3 refills | Status: AC

## 2024-03-25 ENCOUNTER — Other Ambulatory Visit: Payer: Self-pay | Admitting: Sports Medicine

## 2024-03-25 ENCOUNTER — Ambulatory Visit: Admitting: Family Medicine

## 2024-03-25 VITALS — BP 128/92 | HR 78 | Ht 66.5 in | Wt 209.0 lb

## 2024-03-25 DIAGNOSIS — Z1159 Encounter for screening for other viral diseases: Secondary | ICD-10-CM

## 2024-03-25 DIAGNOSIS — Z0000 Encounter for general adult medical examination without abnormal findings: Secondary | ICD-10-CM | POA: Diagnosis not present

## 2024-03-25 DIAGNOSIS — M1711 Unilateral primary osteoarthritis, right knee: Secondary | ICD-10-CM

## 2024-03-25 DIAGNOSIS — E559 Vitamin D deficiency, unspecified: Secondary | ICD-10-CM

## 2024-03-25 DIAGNOSIS — E669 Obesity, unspecified: Secondary | ICD-10-CM | POA: Diagnosis not present

## 2024-03-25 DIAGNOSIS — I10 Essential (primary) hypertension: Secondary | ICD-10-CM | POA: Diagnosis not present

## 2024-03-25 DIAGNOSIS — M797 Fibromyalgia: Secondary | ICD-10-CM

## 2024-03-25 DIAGNOSIS — M160 Bilateral primary osteoarthritis of hip: Secondary | ICD-10-CM

## 2024-03-25 DIAGNOSIS — M47816 Spondylosis without myelopathy or radiculopathy, lumbar region: Secondary | ICD-10-CM

## 2024-03-25 LAB — HEPATIC FUNCTION PANEL
ALT: 11 U/L (ref 0–35)
AST: 15 U/L (ref 0–37)
Albumin: 4.3 g/dL (ref 3.5–5.2)
Alkaline Phosphatase: 79 U/L (ref 39–117)
Bilirubin, Direct: 0.1 mg/dL (ref 0.0–0.3)
Total Bilirubin: 0.5 mg/dL (ref 0.2–1.2)
Total Protein: 7.2 g/dL (ref 6.0–8.3)

## 2024-03-25 LAB — CBC WITH DIFFERENTIAL/PLATELET
Basophils Absolute: 0 10*3/uL (ref 0.0–0.1)
Basophils Relative: 0.7 % (ref 0.0–3.0)
Eosinophils Absolute: 0.1 10*3/uL (ref 0.0–0.7)
Eosinophils Relative: 2.4 % (ref 0.0–5.0)
HCT: 40.3 % (ref 36.0–46.0)
Hemoglobin: 13.2 g/dL (ref 12.0–15.0)
Lymphocytes Relative: 29.3 % (ref 12.0–46.0)
Lymphs Abs: 1.1 10*3/uL (ref 0.7–4.0)
MCHC: 32.7 g/dL (ref 30.0–36.0)
MCV: 91.3 fl (ref 78.0–100.0)
Monocytes Absolute: 0.2 10*3/uL (ref 0.1–1.0)
Monocytes Relative: 6.7 % (ref 3.0–12.0)
Neutro Abs: 2.2 10*3/uL (ref 1.4–7.7)
Neutrophils Relative %: 60.9 % (ref 43.0–77.0)
Platelets: 269 10*3/uL (ref 150.0–400.0)
RBC: 4.41 Mil/uL (ref 3.87–5.11)
RDW: 13.4 % (ref 11.5–15.5)
WBC: 3.6 10*3/uL — ABNORMAL LOW (ref 4.0–10.5)

## 2024-03-25 LAB — BASIC METABOLIC PANEL WITH GFR
BUN: 12 mg/dL (ref 6–23)
CO2: 31 meq/L (ref 19–32)
Calcium: 9.7 mg/dL (ref 8.4–10.5)
Chloride: 102 meq/L (ref 96–112)
Creatinine, Ser: 0.88 mg/dL (ref 0.40–1.20)
GFR: 68.95 mL/min (ref >60.00)
Glucose, Bld: 104 mg/dL — ABNORMAL HIGH (ref 70–99)
Potassium: 4 meq/L (ref 3.5–5.1)
Sodium: 140 meq/L (ref 135–145)

## 2024-03-25 LAB — VITAMIN D 25 HYDROXY (VIT D DEFICIENCY, FRACTURES): VITD: 28.37 ng/mL — ABNORMAL LOW (ref 30.00–100.00)

## 2024-03-25 LAB — LIPID PANEL
Cholesterol: 197 mg/dL (ref 0–200)
HDL: 67.9 mg/dL (ref >39.00)
LDL Cholesterol: 106 mg/dL — ABNORMAL HIGH (ref 0–99)
NonHDL: 129.55
Total CHOL/HDL Ratio: 3
Triglycerides: 120 mg/dL (ref 0.0–149.0)
VLDL: 24 mg/dL (ref 0.0–40.0)

## 2024-03-25 LAB — TSH: TSH: 0.66 u[IU]/mL (ref 0.35–5.50)

## 2024-03-25 NOTE — Progress Notes (Signed)
 Subjective:    Patient ID: Linda Buckley, female    DOB: [birth year], 68 y.o.   MRN: [id number]  HPI CPE- UTD on colonoscopy, immunizations.    Patient Care Team    Relationship Specialty Notifications Start End  Jess Morita, MD PCP - General Family Medicine  01/12/14   Arvil Birks, MD Consulting Physician Orthopedic Surgery  10/23/15   Adelaide Adjutant, MD Consulting Physician Physical Medicine and Rehabilitation  10/23/15   Mort Ards, MD Consulting Physician Orthopedic Surgery  04/03/16   Janel Medford, MD (Inactive) Attending Physician Gastroenterology  11/05/17   Brian Campanile, MD (Inactive) Consulting Physician Neurology  11/05/17   Dorma Gash, PA-C Physician Assistant Physician Assistant  11/05/17   Clem Currier, NP Registered Nurse Gerontology  11/05/17   Jerry Morgans, PA-C Physician Assistant Orthopedic Surgery  12/01/17   Rolando Cliche, [facility] Pharmacist Pharmacist  04/14/20    Comment: PHONE NUMBER [phone]     Health Maintenance  Topic Date Due   Hepatitis C Screening  Never done   Pneumonia Vaccine 48+ Years old (1 of 1 - PCV) Never done   Zoster Vaccines- Shingrix (2 of 2) 06/13/2022   COVID-19 Vaccine (6 - 2024-25 season) 08/17/2023   Medicare Annual Wellness (AWV)  03/04/2024   MAMMOGRAM  04/23/2024   INFLUENZA VACCINE  07/16/2024   DEXA SCAN  09/10/2025   DTaP/Tdap/Td (2 - Td or Tdap) 11/06/2027   Colonoscopy  01/18/2029   HIV Screening  Completed   HPV VACCINES  Aged Out   Meningococcal B Vaccine  Aged Out      Review of Systems Patient reports no vision/ hearing changes, adenopathy,fever, weight change,  persistant/recurrent hoarseness , swallowing issues, chest pain, palpitations, edema, persistant/recurrent cough, hemoptysis, dyspnea (rest/exertional/paroxysmal nocturnal), gastrointestinal bleeding (melena, rectal bleeding), abdominal pain, significant heartburn, bowel changes, GU symptoms (dysuria, hematuria, incontinence),  Gyn symptoms (abnormal  bleeding, pain),  syncope, focal weakness, memory loss, skin/hair/nail changes, abnormal bruising or bleeding  + stress- this is son's birth month and he passed on Mother's Day.  + insomnia- pt reports Clonazepam nightly.  Just restarted Amitriptyline nightly. + neuropathy    Objective:   Physical Exam General Appearance:    Alert, cooperative, no distress, appears stated age  Head:    Normocephalic, without obvious abnormality, atraumatic  Eyes:    PERRL, conjunctiva/corneas clear, EOM's intact both eyes  Ears:    Normal TM's and external ear canals, both ears  Nose:   Nares normal, septum midline, mucosa normal, no drainage    or sinus tenderness  Throat:   Lips, mucosa, and tongue normal; teeth and gums normal  Neck:   Supple, symmetrical, trachea midline, no adenopathy;    Thyroid: no enlargement/tenderness/nodules  Back:     Symmetric, no curvature, ROM normal, no CVA tenderness  Lungs:     Clear to auscultation bilaterally, respirations unlabored  Chest Wall:    No tenderness or deformity   Heart:    Regular rate and rhythm, S1 and S2 normal, no murmur, rub   or gallop  Breast Exam:    Deferred to GYN  Abdomen:     Soft, non-tender, bowel sounds active all four quadrants,    no masses, no organomegaly  Genitalia:    Deferred to GYN  Rectal:    Extremities:   Extremities normal, atraumatic, no cyanosis or edema  Pulses:   2+ and symmetric all extremities  Skin:   Skin color, texture, turgor normal, no  rashes or lesions  Lymph nodes:   Cervical, supraclavicular, and axillary nodes normal  Neurologic:   CNII-XII intact, normal strength, sensation and reflexes    throughout          Assessment & Plan:

## 2024-03-25 NOTE — Patient Instructions (Signed)
 Follow up in 6 weeks to recheck blood pressure We'll notify you of your lab results and make any changes if needed IF your blood pressure is consistently higher than 135/85- let me know! Continue to work on healthy diet and regular exercise- you can do it! Call with any questions or concerns Stay Safe!  Stay Healthy! Hang in there!!!

## 2024-03-26 ENCOUNTER — Encounter: Payer: Self-pay | Admitting: Family Medicine

## 2024-03-26 ENCOUNTER — Other Ambulatory Visit: Payer: Self-pay

## 2024-03-26 ENCOUNTER — Telehealth: Payer: Self-pay

## 2024-03-26 LAB — HEPATITIS C ANTIBODY: Hepatitis C Ab: NONREACTIVE

## 2024-03-26 MED ORDER — VITAMIN D (ERGOCALCIFEROL) 1.25 MG (50000 UNIT) PO CAPS: 50000.0000 [IU] | ORAL_CAPSULE | ORAL | 0 refills | Status: DC

## 2024-03-26 MED ORDER — TRAMADOL HCL 50 MG PO TABS: 100.0000 mg | ORAL_TABLET | Freq: Three times a day (TID) | ORAL | 0 refills | Status: DC

## 2024-03-26 NOTE — Telephone Encounter (Signed)
-----   Message from Neena Rhymes sent at 03/26/2024  7:40 AM EDT ----- Labs look good w/ exception of low Vit D.  Based on this, we need to start 50,000 units weekly x12 weeks in addition to daily OTC supplement of at least 2000 units.

## 2024-03-27 ENCOUNTER — Encounter: Payer: Self-pay | Admitting: Family Medicine

## 2024-03-27 NOTE — Assessment & Plan Note (Signed)
 Pt's PE WNL w/ exception of BMI.  UTD on colonoscopy, mammo, immunizatoins.  Check labs.  Anticipatory guidance provided.

## 2024-03-30 ENCOUNTER — Ambulatory Visit: Payer: Self-pay

## 2024-03-30 NOTE — Telephone Encounter (Signed)
 Chief Complaint: Insect Bite Symptoms: Itching Frequency: Began Saturday Pertinent Negatives: Patient denies Fever, SOB Disposition: [] ED /[] Urgent Care (no appt availability in office) / [x] Appointment(In office/virtual)/ []  [facility]/ [] Home Care/ [] Refused Recommended Disposition /[] [facility]/ []  Follow-up with PCP Additional Notes: Pt reports she was out working outside at church on Saturday and notes she was wearing long pants/shirt and was wearing insect repellant. She notes multiple affected areas, reporting the bites as red areas with "white heads". Pt denies pain but reports severe itching. Has used Benadryl oral 'and topical with little to no relief. OV scheduled. This RN educated pt on home care, new-worsening symptoms, when to call back/seek emergent care. Pt verbalized understanding and agrees to plan.    Copied from CRM [id number]. Topic: Clinical - Red Word Triage >> Mar 30, 2024  9:33 AM Lovett Ruck C wrote: Red Word that prompted transfer to Nurse Triage: patient was working in the yard on Saturday and got bit by a bunch of little ants. The pharmacy told her to use bendadryl to help but it's not working and the bites are itching terribly. Reason for Disposition  [1] SEVERE local itching (i.e., interferes with work, school, sleep) AND [2] not improved after 24 hours of hydrocortisone cream  Answer Assessment - Initial Assessment Questions 1. TYPE of INSECT: "What type of insect was it?"      ]Multiple Ants 2. ONSET: "When did you get bitten?"      Saturday 3. LOCATION: "Where is the insect bite located?"      Stomach "welted" but improving, bilateral arms, low back, legs 4. REDNESS: "Is the area red or pink?" If Yes, ask: "What size is area of redness?" (inches or cm). "When did the redness start?"     Red, "white heads" 5. PAIN: "Is there any pain?" If Yes, ask: "How bad is it?"  (Scale 1-10; or mild, moderate, severe)     None 6. ITCHING: "Does it  itch?" If Yes, ask: "How bad is the itch?"    - MILD: doesn't interfere with normal activities   - MODERATE-SEVERE: interferes with work, school, sleep, or other activities      Severe 7. SWELLING: "How big is the swelling?" (inches, cm, or compare to coins)     Just the one on abd, improving 8. OTHER SYMPTOMS: "Do you have any other symptoms?"  (e.g., difficulty breathing, hives)     None  Protocols used: Insect Bite-A-AH

## 2024-03-31 ENCOUNTER — Ambulatory Visit: Admitting: Family Medicine

## 2024-05-05 ENCOUNTER — Telehealth: Payer: Self-pay | Admitting: Family Medicine

## 2024-05-05 NOTE — Telephone Encounter (Signed)
 Tried to LVM with pt to let her know that her appt tomorrow was sched with the wrong office and provider; was going to assist with sched correctly however pt vmail is not set up. Appt has been canceled. Pt would need to sched their 6 week FU with her PCP.   FYI

## 2024-05-06 ENCOUNTER — Ambulatory Visit: Admitting: Family Medicine

## 2024-05-07 ENCOUNTER — Ambulatory Visit (INDEPENDENT_AMBULATORY_CARE_PROVIDER_SITE_OTHER): Admitting: Family Medicine

## 2024-05-07 ENCOUNTER — Encounter: Payer: Self-pay | Admitting: Family Medicine

## 2024-05-07 VITALS — BP 138/92 | HR 64 | Temp 98.6°F | Ht 66.0 in | Wt 212.4 lb

## 2024-05-07 DIAGNOSIS — I10 Essential (primary) hypertension: Secondary | ICD-10-CM | POA: Diagnosis not present

## 2024-05-07 MED ORDER — AMLODIPINE BESYLATE 5 MG PO TABS
5.0000 mg | ORAL_TABLET | Freq: Every day | ORAL | 1 refills | Status: DC
Start: 2024-05-07 — End: 2024-07-14

## 2024-05-07 NOTE — Patient Instructions (Addendum)
 Follow up in 4-6 weeks to recheck blood pressure Start the Amlodipine once daily for blood pressure Keep up the good work on healthy diet and regular exercise- you can do it! Call with any questions or concerns Stay Safe!  Stay Healthy! Happy Memorial Day!!!

## 2024-05-07 NOTE — Progress Notes (Signed)
   Subjective:    Patient ID: Linda Buckley, female    DOB: 11-Aug-1956, 68 y.o.   MRN: [id number]  HPI HTN- at last visit BP was 132/100.  She felt it was stress related due to son's birth month and anniversary of his death.  Feels like she has a good support system.  Not currently on medication.  No CP, SOB, HA's, visual changes, edema.   Review of Systems For ROS see HPI     Objective:   Physical Exam Vitals reviewed.  Constitutional:      General: She is not in acute distress.    Appearance: Normal appearance. She is well-developed. She is not ill-appearing.  HENT:     Head: Normocephalic and atraumatic.  Eyes:     Conjunctiva/sclera: Conjunctivae normal.     Pupils: Pupils are equal, round, and reactive to light.  Neck:     Thyroid : No thyromegaly.  Cardiovascular:     Rate and Rhythm: Normal rate and regular rhythm.     Pulses: Normal pulses.     Heart sounds: Normal heart sounds. No murmur heard. Pulmonary:     Effort: Pulmonary effort is normal. No respiratory distress.     Breath sounds: Normal breath sounds.  Abdominal:     General: There is no distension.     Palpations: Abdomen is soft.     Tenderness: There is no abdominal tenderness.  Musculoskeletal:     Cervical back: Normal range of motion and neck supple.     Right lower leg: No edema.     Left lower leg: No edema.  Lymphadenopathy:     Cervical: No cervical adenopathy.  Skin:    General: Skin is warm and dry.  Neurological:     General: No focal deficit present.     Mental Status: She is alert and oriented to person, place, and time.  Psychiatric:        Mood and Affect: Mood normal.        Behavior: Behavior normal.           Assessment & Plan:

## 2024-05-07 NOTE — Assessment & Plan Note (Signed)
 Chronic problem.  Currently asymptomatic but BP not yet in normal range.  Brother had MI yesterday.  Will add Amlodipine 5mg  daily and monitor closely.  Pt expressed understanding and is in agreement w/ plan.

## 2024-05-25 ENCOUNTER — Other Ambulatory Visit: Payer: Self-pay | Admitting: Family Medicine

## 2024-05-25 DIAGNOSIS — Z1231 Encounter for screening mammogram for malignant neoplasm of breast: Secondary | ICD-10-CM

## 2024-05-27 ENCOUNTER — Ambulatory Visit
Admission: RE | Admit: 2024-05-27 | Discharge: 2024-05-27 | Disposition: A | Discharge status: Home / Self Care | Source: Ambulatory Visit

## 2024-05-27 DIAGNOSIS — Z1231 Encounter for screening mammogram for malignant neoplasm of breast: Secondary | ICD-10-CM | POA: Diagnosis not present

## 2024-05-28 ENCOUNTER — Inpatient Hospital Stay: Payer: Medicare PPO | Attending: Oncology | Admitting: Oncology

## 2024-05-28 ENCOUNTER — Inpatient Hospital Stay: Payer: Medicare PPO

## 2024-05-28 ENCOUNTER — Encounter: Payer: Self-pay | Admitting: Oncology

## 2024-05-28 VITALS — BP 126/83 | HR 71 | Temp 97.7°F | Resp 16 | Ht 66.0 in | Wt 214.1 lb

## 2024-05-28 DIAGNOSIS — Z79899 Other long term (current) drug therapy: Secondary | ICD-10-CM | POA: Insufficient documentation

## 2024-05-28 DIAGNOSIS — Z86711 Personal history of pulmonary embolism: Secondary | ICD-10-CM | POA: Insufficient documentation

## 2024-05-28 DIAGNOSIS — D72819 Decreased white blood cell count, unspecified: Secondary | ICD-10-CM | POA: Insufficient documentation

## 2024-05-28 DIAGNOSIS — R079 Chest pain, unspecified: Secondary | ICD-10-CM | POA: Insufficient documentation

## 2024-05-28 DIAGNOSIS — Z87898 Personal history of other specified conditions: Secondary | ICD-10-CM

## 2024-05-28 DIAGNOSIS — Z7901 Long term (current) use of anticoagulants: Secondary | ICD-10-CM | POA: Insufficient documentation

## 2024-05-28 DIAGNOSIS — Z791 Long term (current) use of non-steroidal anti-inflammatories (NSAID): Secondary | ICD-10-CM | POA: Insufficient documentation

## 2024-05-28 DIAGNOSIS — I700 Atherosclerosis of aorta: Secondary | ICD-10-CM | POA: Diagnosis not present

## 2024-05-28 DIAGNOSIS — G4730 Sleep apnea, unspecified: Secondary | ICD-10-CM | POA: Insufficient documentation

## 2024-05-28 DIAGNOSIS — I2699 Other pulmonary embolism without acute cor pulmonale: Secondary | ICD-10-CM

## 2024-05-28 DIAGNOSIS — I10 Essential (primary) hypertension: Secondary | ICD-10-CM | POA: Diagnosis not present

## 2024-05-28 DIAGNOSIS — M25472 Effusion, left ankle: Secondary | ICD-10-CM | POA: Insufficient documentation

## 2024-05-28 DIAGNOSIS — Z9884 Bariatric surgery status: Secondary | ICD-10-CM | POA: Insufficient documentation

## 2024-05-28 DIAGNOSIS — M797 Fibromyalgia: Secondary | ICD-10-CM | POA: Insufficient documentation

## 2024-05-28 DIAGNOSIS — M25471 Effusion, right ankle: Secondary | ICD-10-CM | POA: Insufficient documentation

## 2024-05-28 LAB — CBC WITH DIFFERENTIAL (CANCER CENTER ONLY)
Abs Immature Granulocytes: 0 10*3/uL (ref 0.00–0.07)
Basophils Absolute: 0 10*3/uL (ref 0.0–0.1)
Basophils Relative: 0 %
Eosinophils Absolute: 0.1 10*3/uL (ref 0.0–0.5)
Eosinophils Relative: 3 %
HCT: 38.7 % (ref 36.0–46.0)
Hemoglobin: 12.2 g/dL (ref 12.0–15.0)
Immature Granulocytes: 0 %
Lymphocytes Relative: 20 %
Lymphs Abs: 0.7 10*3/uL (ref 0.7–4.0)
MCH: 29.3 pg (ref 26.0–34.0)
MCHC: 31.5 g/dL (ref 30.0–36.0)
MCV: 93 fL (ref 80.0–100.0)
Monocytes Absolute: 0.3 10*3/uL (ref 0.1–1.0)
Monocytes Relative: 9 %
Neutro Abs: 2.4 10*3/uL (ref 1.7–7.7)
Neutrophils Relative %: 68 %
Platelet Count: 221 10*3/uL (ref 150–400)
RBC: 4.16 MIL/uL (ref 3.87–5.11)
RDW: 12.4 % (ref 11.5–15.5)
WBC Count: 3.5 10*3/uL — ABNORMAL LOW (ref 4.0–10.5)
nRBC: 0 % (ref 0.0–0.2)

## 2024-05-28 LAB — D-DIMER, QUANTITATIVE: D-Dimer, Quant: 1.05 ug{FEU}/mL — ABNORMAL HIGH (ref 0.00–0.50)

## 2024-05-28 NOTE — Assessment & Plan Note (Addendum)
 Lost 24 pounds from her gastric sleeve surgery.   Now she is gaining back weight.

## 2024-05-28 NOTE — Progress Notes (Signed)
 [facility]  HEMATOLOGY CLINIC PROGRESS NOTE  PATIENT NAME: Linda Buckley   MR#: [id number] DOB: [birth year]  Patient Care Team: Jess Morita, MD as PCP - General (Family Medicine) Arvil Birks, MD as Consulting Physician (Orthopedic Surgery) Adelaide Adjutant, MD as Consulting Physician (Physical Medicine and Rehabilitation) Mort Ards, MD as Consulting Physician (Orthopedic Surgery) Janel Medford, MD (Inactive) as Attending Physician (Gastroenterology) Brian Campanile, MD (Inactive) as Consulting Physician (Neurology) Finis Hugger, Dimple Francis, PA-C as Physician Assistant (Physician Assistant) Clem Currier, NP as Registered Nurse (Gerontology) Jerry Morgans, PA-C as Physician Assistant (Orthopedic Surgery) Rolando Cliche, [facility] as Pharmacist (Pharmacist)  Date of visit: 05/28/2024   ASSESSMENT & PLAN:   Linda Buckley is a 68 y.o. lady with a past medical history of hypertension, sleep apnea, fibromyalgia, depression, gastric sleeve surgery, was referred to our service in January 2025 for evaluation of possible hypercoagulable state after she was diagnosed with pulmonary embolism on 11/17/2023. Grossly negative hypercoagulable workup.   PE (pulmonary thromboembolism) (HCC) First episode of PE on 11/17/2023 with no clear provoking factors. Shortness of breath and bilateral ankle swelling reported. Family history of blood clots.  On her initial consultation with us  on 12/18/2023, we obtained hypercoagulable workup. Prothrombin gene mutation, factor V Leiden mutation, beta-2  glycoprotein antibodies, anticardiolipin antibodies, lupus anticoagulant were all negative.  D-dimer was undetectable.  Rest of the hypercoagulable workup was deferred since it can be falsely abnormal given recent embolism.    Patient has had trouble filling her Xarelto  prescription as it was not approved by her insurance and she had to pay out-of-pocket.  We switched her to Eliquis  5 mg p.o.  twice daily and this was covered by her insurance.  D-dimer was previously undetectable.  However today it is increased again at 1.05.  Patient is also complaining of some chest discomfort.  It would be important to rule out any persistence or recurrence of pulmonary embolism.  Hence we will obtain CT chest angiogram and call her with results.  Request placed today.    CBCD unremarkable except for chronic mild leukopenia with white count of 3500, normal differential.  We did pursue remaining hypercoagulable workup today, with protein C activity, protein S activity, Antithrombin III activity.  Will arrange for phone call visit in 2 weeks to discuss above-mentioned results.  She was advised to continue Eliquis  for now.  She is up-to-date on age-appropriate cancer screening.  History of weight loss Lost 24 pounds from her gastric sleeve surgery.   Now she is gaining back weight.     I spent a total of 25 minutes during this encounter with the patient including review of chart and various tests results, discussions about plan of care and coordination of care plan.  I reviewed lab results and outside records for this visit and discussed relevant results with the patient. Diagnosis, plan of care and treatment options were also discussed in detail with the patient. Opportunity provided to ask questions and answers provided to her apparent satisfaction. Provided instructions to call our clinic with any problems, questions or concerns prior to return visit. I recommended to continue follow-up with PCP and sub-specialists. She verbalized understanding and agreed with the plan. No barriers to learning was detected.  Linda Berber, MD  05/28/2024 1:33 PM  [facility] - A DEPT OF [facility] [address] [place] [address] Dept: [phone] Dept Fax: [phone]   CHIEF  COMPLAINT/ REASON FOR VISIT:  Follow-up for history of  unprovoked pulmonary embolism in December 2024.  Grossly negative hypercoagulable workup.  INTERVAL HISTORY:  Discussed the use of AI scribe software for clinical note transcription with the patient, who gave verbal consent to proceed.   Patient returns for follow-up to clinic today.  She is currently on Eliquis  twice daily. She has been experiencing a dull, aching chest pain located centrally for the past one to two weeks. The pain does not have a burning quality, and she is uncertain if it is tender to touch. There is a little pain on her left side, but no associated dyspnea.  She has been on blood thinners for at least six months. A previous workup included a D-dimer test, which was undetectable at the last evaluation.  She notes a recent change in her weight, initially losing weight but now gaining despite not increasing her food intake. She typically eats once a day but has been trying to incorporate cereal in the morning and dinner.  SUMMARY OF HEMATOLOGIC HISTORY:  The patient was admitted to the hospital on 11/17/2023 after experiencing shortness of breath and a lingering cold.  CT angiogram showed pulmonary embolism in the right lung with most proximal thrombus at lobar pulmonary artery level.  No evidence of right heart strain.  She was initially on heparin  drip and was later started on Xarelto .   The patient denies any recent surgeries, prolonged bed rest, or long trips. There is a family history of a blood clot in the patient's father. The patient reports bilateral ankle swelling.   In addition to the pulmonary embolism, the patient reports unintentional weight loss of 24 pounds over the past 2-3 months without any changes in diet or exercise.  It is presumed to be from her gastric sleeve procedure.  The patient also reports changes in bowel habits, with increased frequency and loose stools over the past two weeks. The patient denies any chest pain or acid reflux symptoms.   First  episode of PE on 11/17/2023 with no clear provoking factors. Shortness of breath and bilateral ankle swelling reported. Family history of blood clots.  On her initial consultation with us  on 12/18/2023, we obtained hypercoagulable workup. Prothrombin gene mutation, factor V Leiden mutation, beta-2  glycoprotein antibodies, anticardiolipin antibodies, lupus anticoagulant were all negative.  D-dimer was undetectable.  Rest of the hypercoagulable workup was deferred since it can be falsely abnormal given recent embolism.    Patient has had trouble filling her Xarelto  prescription as it was not approved by her insurance and she had to pay out-of-pocket.  We switched her to Eliquis  5 mg p.o. twice daily and this was covered by her insurance.  At the minimum 6 months of anticoagulation is recommended.  She is up-to-date on age-appropriate cancer screening.   I have reviewed the past medical history, past surgical history, social history and family history with the patient and they are unchanged from previous note.  ALLERGIES: She is allergic to aspirin and bee venom.  MEDICATIONS:  Current Outpatient Medications  Medication Sig Dispense Refill   albuterol  (VENTOLIN  HFA) 108 (90 Base) MCG/ACT inhaler Inhale 2 puffs into the lungs every 6 (six) hours as needed for wheezing or shortness of breath. 8 g 1   amitriptyline  (ELAVIL ) 50 MG tablet Take 1 tablet (50 mg total) by mouth at bedtime. 90 tablet 3   amLODipine  (NORVASC ) 5 MG tablet Take 1 tablet (5 mg total) by mouth daily. 30 tablet 1  apixaban  (ELIQUIS ) 5 MG TABS tablet Take 1 tablet (5 mg total) by mouth 2 (two) times daily. 60 tablet 4   busPIRone  (BUSPAR ) 30 MG tablet Take 1 tablet (30 mg total) by mouth in the morning and at bedtime. 60 tablet 3   cetirizine  (ZYRTEC ) 10 MG tablet TAKE 1 TABLET(10 MG) BY MOUTH DAILY 30 tablet 0   clonazePAM  (KLONOPIN ) 0.5 MG tablet TAKE ONE TABLET BY MOUTH EVERY NIGHT AT BEDTIME 30 tablet 3   Cyanocobalamin   (VITAMIN B-12 PO) Take by mouth.     DULoxetine  (CYMBALTA ) 60 MG capsule TAKE 1 CAPSULE DAILY FOR 2 WEEKS AND THEN INCREASE TO 1 CAPSULE TWICE DAILY 180 capsule 3   meloxicam  (MOBIC ) 15 MG tablet Take 1 tablet (15 mg total) by mouth daily. 30 tablet 11   Multiple Vitamin (MULTIVITAMIN) capsule Take 1 capsule by mouth daily. With iron     SUMAtriptan  (IMITREX ) 50 MG tablet Take 1 tablet (50 mg total) by mouth every 2 (two) hours as needed for migraine. May repeat in 2 hours if headache persists or recurs. 10 tablet 6   traMADol  (ULTRAM ) 50 MG tablet Take 2 tablets (100 mg total) by mouth in the morning, at noon, and at bedtime. 180 tablet 0   Vitamin D , Ergocalciferol , (DRISDOL ) 1.25 MG (50000 UNIT) CAPS capsule Take 1 capsule (50,000 Units total) by mouth every 7 (seven) days. 12 capsule 0   VITAMIN E  PO Take by mouth.     No current facility-administered medications for this visit.     REVIEW OF SYSTEMS:    ROS  All other pertinent systems were reviewed with the patient and are negative.  PHYSICAL EXAMINATION:   Onc Performance Status - 05/28/24 1117       ECOG Perf Status   ECOG Perf Status Restricted in physically strenuous activity but ambulatory and able to carry out work of a light or sedentary nature, e.g., light house work, office work      KPS SCALE   KPS % SCORE Able to carry on normal activity, minor s/s of disease          Vitals:   05/28/24 1112  BP: 126/83  Pulse: 71  Resp: 16  Temp: 97.7 F (36.5 C)  SpO2: 99%   Filed Weights   05/28/24 1112  Weight: 214 lb 1.6 oz (97.1 kg)    Physical Exam Constitutional:      General: She is not in acute distress.    Appearance: Normal appearance.  HENT:     Head: Normocephalic and atraumatic.   Eyes:     General: No scleral icterus.    Conjunctiva/sclera: Conjunctivae normal.    Cardiovascular:     Rate and Rhythm: Normal rate and regular rhythm.     Heart sounds: Normal heart sounds.  Pulmonary:      Effort: Pulmonary effort is normal.     Breath sounds: Normal breath sounds.  Abdominal:     General: There is no distension.   Musculoskeletal:     Right lower leg: No edema.     Left lower leg: No edema.   Neurological:     General: No focal deficit present.     Mental Status: She is alert and oriented to person, place, and time.   Psychiatric:        Mood and Affect: Mood normal.        Behavior: Behavior normal.        Thought Content: Thought content normal.  LABORATORY DATA:   I have reviewed the data as listed.  Results for orders placed or performed in visit on 05/28/24  D-dimer, quantitative  Result Value Ref Range   D-Dimer, Quant 1.05 (H) 0.00 - 0.50 ug/mL-FEU  CBC with Differential (Cancer Center Only)  Result Value Ref Range   WBC Count 3.5 (L) 4.0 - 10.5 K/uL   RBC 4.16 3.87 - 5.11 MIL/uL   Hemoglobin 12.2 12.0 - 15.0 g/dL   HCT 16.1 09.6 - 04.5 %   MCV 93.0 80.0 - 100.0 fL   MCH 29.3 26.0 - 34.0 pg   MCHC 31.5 30.0 - 36.0 g/dL   RDW 40.9 81.1 - 91.4 %   Platelet Count 221 150 - 400 K/uL   nRBC 0.0 0.0 - 0.2 %   Neutrophils Relative % 68 %   Neutro Abs 2.4 1.7 - 7.7 K/uL   Lymphocytes Relative 20 %   Lymphs Abs 0.7 0.7 - 4.0 K/uL   Monocytes Relative 9 %   Monocytes Absolute 0.3 0.1 - 1.0 K/uL   Eosinophils Relative 3 %   Eosinophils Absolute 0.1 0.0 - 0.5 K/uL   Basophils Relative 0 %   Basophils Absolute 0.0 0.0 - 0.1 K/uL   Immature Granulocytes 0 %   Abs Immature Granulocytes 0.00 0.00 - 0.07 K/uL    RADIOGRAPHIC STUDIES:  No recent pertinent imaging studies available to review.  Orders Placed This Encounter  Procedures   CT Angio Chest Pulmonary Embolism (PE) W or WO Contrast    Standing Status:   Future    Expected Date:   05/31/2024    Expiration Date:   05/28/2025    If indicated for the ordered procedure, I authorize the administration of contrast media per Radiology protocol:   Yes    Does the patient have a contrast  media/X-ray dye allergy?:   No    Preferred imaging location?:   [facility]     Future Appointments  Date Time Provider Department Center  06/04/2024  1:20 PM Jess Morita, MD [facility]  06/11/2024  3:45 PM Zackery Brine, Gale Jude, MD [facility] None  06/25/2024 11:00 AM Nicholas Bari, MD [facility] None  10/01/2024 10:15 AM [facility] None  10/01/2024 10:45 AM Lucero Ide, Gale Jude, MD [facility] None     This document was completed utilizing speech recognition software. Grammatical errors, random word insertions, pronoun errors, and incomplete sentences are an occasional consequence of this system due to software limitations, ambient noise, and hardware issues. Any formal questions or concerns about the content, text or information contained within the body of this dictation should be directly addressed to the provider for clarification.

## 2024-05-28 NOTE — Assessment & Plan Note (Addendum)
 First episode of PE on 11/17/2023 with no clear provoking factors. Shortness of breath and bilateral ankle swelling reported. Family history of blood clots.  On her initial consultation with us  on 12/18/2023, we obtained hypercoagulable workup. Prothrombin gene mutation, factor V Leiden mutation, beta-2  glycoprotein antibodies, anticardiolipin antibodies, lupus anticoagulant were all negative.  D-dimer was undetectable.  Rest of the hypercoagulable workup was deferred since it can be falsely abnormal given recent embolism.    Patient has had trouble filling her Xarelto  prescription as it was not approved by her insurance and she had to pay out-of-pocket.  We switched her to Eliquis  5 mg p.o. twice daily and this was covered by her insurance.  D-dimer was previously undetectable.  However today it is increased again at 1.05.  Patient is also complaining of some chest discomfort.  It would be important to rule out any persistence or recurrence of pulmonary embolism.  Hence we will obtain CT chest angiogram and call her with results.  Request placed today.    CBCD unremarkable except for chronic mild leukopenia with white count of 3500, normal differential.  We did pursue remaining hypercoagulable workup today, with protein C activity, protein S activity, Antithrombin III activity.  Will arrange for phone call visit in 2 weeks to discuss above-mentioned results.  She was advised to continue Eliquis  for now.  She is up-to-date on age-appropriate cancer screening.

## 2024-05-29 LAB — ANTITHROMBIN PANEL
AT III AG PPP IMM-ACNC: 82 % (ref 72–124)
Antithrombin Activity: 111 % (ref 75–135)

## 2024-05-29 LAB — PROTEIN S PANEL
Protein S Activity: 71 % (ref 63–140)
Protein S Ag, Free: 96 % (ref 61–136)
Protein S Ag, Total: 86 % (ref 60–150)

## 2024-05-29 LAB — PROTEIN C ACTIVITY: Protein C Activity: 110 % (ref 73–180)

## 2024-06-02 ENCOUNTER — Other Ambulatory Visit: Payer: Self-pay | Admitting: Family Medicine

## 2024-06-02 DIAGNOSIS — R928 Other abnormal and inconclusive findings on diagnostic imaging of breast: Secondary | ICD-10-CM

## 2024-06-04 ENCOUNTER — Ambulatory Visit (HOSPITAL_COMMUNITY)
Admission: RE | Admit: 2024-06-04 | Discharge: 2024-06-04 | Disposition: A | Discharge status: Home / Self Care | Source: Ambulatory Visit | Attending: Oncology | Admitting: Oncology

## 2024-06-04 ENCOUNTER — Ambulatory Visit (INDEPENDENT_AMBULATORY_CARE_PROVIDER_SITE_OTHER): Admitting: Family Medicine

## 2024-06-04 ENCOUNTER — Encounter (HOSPITAL_COMMUNITY): Payer: Self-pay

## 2024-06-04 ENCOUNTER — Encounter: Payer: Self-pay | Admitting: Family Medicine

## 2024-06-04 VITALS — BP 118/72 | HR 81 | Resp 16 | Ht 66.0 in | Wt 215.2 lb

## 2024-06-04 DIAGNOSIS — I10 Essential (primary) hypertension: Secondary | ICD-10-CM

## 2024-06-04 DIAGNOSIS — I700 Atherosclerosis of aorta: Secondary | ICD-10-CM | POA: Diagnosis not present

## 2024-06-04 DIAGNOSIS — R079 Chest pain, unspecified: Secondary | ICD-10-CM | POA: Diagnosis not present

## 2024-06-04 DIAGNOSIS — I2699 Other pulmonary embolism without acute cor pulmonale: Secondary | ICD-10-CM | POA: Insufficient documentation

## 2024-06-04 DIAGNOSIS — K219 Gastro-esophageal reflux disease without esophagitis: Secondary | ICD-10-CM | POA: Diagnosis not present

## 2024-06-04 MED ORDER — OMEPRAZOLE 20 MG PO CPDR: 20.0000 mg | DELAYED_RELEASE_CAPSULE | Freq: Every day | ORAL | 3 refills | Status: AC

## 2024-06-04 MED ORDER — SODIUM CHLORIDE (PF) 0.9 % IJ SOLN
INTRAMUSCULAR | Status: AC
  Filled 2024-06-04: qty 50

## 2024-06-04 MED ORDER — IOHEXOL 350 MG/ML SOLN
75.0000 mL | Freq: Once | INTRAVENOUS | Status: AC | PRN
  Administered 2024-06-04: 75 mL via INTRAVENOUS

## 2024-06-04 NOTE — Patient Instructions (Signed)
 Follow up in November to recheck BP- sooner if needed No labs today- YAY!!! START the Omeprazole  once daily to help w/ discomfort and fullness Call with any questions or concerns Stay Safe!  Stay Healthy! Hang in there!!!

## 2024-06-04 NOTE — Assessment & Plan Note (Signed)
 Ongoing issue.  At last visit we started Amlodipine  5mg  daily w/ improvement in BP.  Currently asymptomatic.  No changes at this time

## 2024-06-04 NOTE — Progress Notes (Signed)
   Subjective:    Patient ID: Linda Buckley, female    DOB: 1956/07/18, 68 y.o.   MRN: [id number]  HPI HTN- at last visit BP was 138/92.  We started her on Amlodipine  5mg  daily.  BP today is well controlled at 118/72.  Pt reports feeling well w/ exception of fatigue.  No CP (except musculoskeletal).  Has ongoing SOB.  Denies HA's, visual changes.  GERD- pt reports having epigastric discomfort and fullness.  Denies hx of reflux/indigestion.  Is on chronic NSAIDs.     Review of Systems For ROS see HPI     Objective:   Physical Exam Constitutional:      General: She is not in acute distress.    Appearance: Normal appearance. She is well-developed. She is obese.  HENT:     Head: Normocephalic and atraumatic.   Eyes:     Conjunctiva/sclera: Conjunctivae normal.     Pupils: Pupils are equal, round, and reactive to light.   Neck:     Thyroid : No thyromegaly.   Cardiovascular:     Rate and Rhythm: Normal rate and regular rhythm.     Heart sounds: Normal heart sounds. No murmur heard. Pulmonary:     Effort: Pulmonary effort is normal. No respiratory distress.     Breath sounds: Normal breath sounds.  Abdominal:     General: There is no distension.     Palpations: Abdomen is soft.     Tenderness: There is no abdominal tenderness.   Musculoskeletal:     Cervical back: Normal range of motion and neck supple.     Right lower leg: Edema (trace-1+) present.     Left lower leg: Edema (trace-1+) present.  Lymphadenopathy:     Cervical: No cervical adenopathy.   Skin:    General: Skin is warm and dry.   Neurological:     Mental Status: She is alert and oriented to person, place, and time. Mental status is at baseline.   Psychiatric:        Mood and Affect: Mood normal.        Behavior: Behavior normal.           Assessment & Plan:

## 2024-06-04 NOTE — Assessment & Plan Note (Signed)
 New.  Pt reports she has never had sxs before but describes epigastric discomfort, full feeling.  She does take NSAIDs regularly.  Will start Omeprazole  20mg  daily and monitor for improvement.  Also discussed diet and lifestyle modifications.  Will follow.

## 2024-06-08 ENCOUNTER — Ambulatory Visit

## 2024-06-08 ENCOUNTER — Ambulatory Visit
Admission: RE | Admit: 2024-06-08 | Discharge: 2024-06-08 | Disposition: A | Discharge status: Home / Self Care | Source: Ambulatory Visit | Attending: Family Medicine | Admitting: Family Medicine

## 2024-06-08 DIAGNOSIS — R928 Other abnormal and inconclusive findings on diagnostic imaging of breast: Secondary | ICD-10-CM | POA: Diagnosis not present

## 2024-06-11 ENCOUNTER — Inpatient Hospital Stay (HOSPITAL_BASED_OUTPATIENT_CLINIC_OR_DEPARTMENT_OTHER): Admitting: Oncology

## 2024-06-11 ENCOUNTER — Encounter: Payer: Self-pay | Admitting: Oncology

## 2024-06-11 DIAGNOSIS — I2699 Other pulmonary embolism without acute cor pulmonale: Secondary | ICD-10-CM

## 2024-06-11 NOTE — Progress Notes (Signed)
 [facility]  HEMATOLOGY-ONCOLOGY ELECTRONIC VISIT PROGRESS NOTE  PATIENT NAME: Linda Buckley   MR#: [id number] DOB: 22-Feb-1956  DATE OF SERVICE: 06/11/2024  Patient Care Team: Mahlon Comer BRAVO, MD as PCP - General (Family Medicine) Shari Easter, MD as Consulting Physician (Orthopedic Surgery) Bonner Ade, MD as Consulting Physician (Physical Medicine and Rehabilitation) Burnetta Aures, MD as Consulting Physician (Orthopedic Surgery) Teressa Toribio SQUIBB, MD (Inactive) as Attending Physician (Gastroenterology) Jenel Carlin POUR, MD (Inactive) as Consulting Physician (Neurology) Johnson Laymon CHRISTELLA RIGGERS as Physician Assistant (Physician Assistant) Sherryl Bouchard, NP as Registered Nurse (Gerontology) Gable Charleston, PA-C as Physician Assistant (Orthopedic Surgery) Pandora Cadet, [facility] as Pharmacist (Pharmacist)  I connected with the patient via telephone conference and verified that I am speaking with the correct person using two identifiers. The patient's location is at home and I am providing care from the [facility].  I discussed the limitations, risks, security and privacy concerns of performing an evaluation and management service by e-visits and the availability of in person appointments. I also discussed with the patient that there may be a patient responsible charge related to this service. The patient expressed understanding and agreed to proceed.   ASSESSMENT & PLAN:   Linda Buckley is a 68 y.o.  lady with a past medical history of hypertension, sleep apnea, fibromyalgia, depression, gastric sleeve surgery, was referred to our service in January 2025 for evaluation of possible hypercoagulable state after she was diagnosed with pulmonary embolism on 11/17/2023. Grossly negative hypercoagulable workup.   PE (pulmonary thromboembolism) (HCC) First episode of PE on 11/17/2023 with no clear provoking factors. Shortness of breath and bilateral ankle swelling  reported. Family history of blood clots.  On her initial consultation with us  on 12/18/2023, we obtained hypercoagulable workup. Prothrombin gene mutation, factor V Leiden mutation, beta-2  glycoprotein antibodies, anticardiolipin antibodies, lupus anticoagulant were all negative.  D-dimer was undetectable.  Rest of the hypercoagulable workup was deferred since it can be falsely abnormal given recent embolism.    Patient has had trouble filling her Xarelto  prescription as it was not approved by her insurance and she had to pay out-of-pocket.  We switched her to Eliquis  5 mg p.o. twice daily and this was covered by her insurance.  D-dimer was previously undetectable.  CBCD unremarkable except for chronic mild leukopenia with white count of 3500, normal differential.  On 05/28/2024, protein C activity, protein S activity, Antithrombin III activity were all within normal limits.  Repeat CT chest angiogram on 06/04/2024 showed no evidence of pulmonary embolism.  Patient preferred to come off of anticoagulation.  She cannot take aspirin because of anaphylactic reaction previously.  Hence close surveillance with monitoring for any concerning symptoms was recommended.  She is up-to-date on age-appropriate cancer screening.   I discussed the assessment and treatment plan with the patient. The patient was provided an opportunity to ask questions and all were answered. The patient agreed with the plan and demonstrated an understanding of the instructions. The patient was advised to call back or seek an in-person evaluation if the symptoms worsen or if the condition fails to improve as anticipated.    I spent 12 minutes over the phone with the patient reviewing test results, discuss management and coordination/planning of care.  Linda Patten, MD 06/11/2024 6:50 PM [facility] - A DEPT OF [facility] [address] [place] [address] Dept:  [phone] Dept Fax: [phone]  INTERVAL HISTORY:  Please see above for problem oriented charting.  The purpose of today's discussion is to explain recent lab results and to formulate plan of care.  Discussed the use of AI scribe software for clinical note transcription with the patient, who gave verbal consent to proceed.  History of Present Illness Linda Buckley is a 68 year old female who presents for follow-up after treatment for pulmonary embolism.  She recently underwent a chest scan last week, which showed no remaining clots in the lungs. Her blood work and additional tests to identify potential causes for blood clots returned negative results.  She has been on anticoagulation therapy for six months following the initial diagnosis of pulmonary embolism. She is allergic to aspirin, which causes swelling, precluding its use for any potential antiplatelet therapy.     SUMMARY OF HEMATOLOGIC HISTORY:   The patient was admitted to the hospital on 11/17/2023 after experiencing shortness of breath and a lingering cold.  CT angiogram showed pulmonary embolism in the right lung with most proximal thrombus at lobar pulmonary artery level.  No evidence of right heart strain.  She was initially on heparin  drip and was later started on Xarelto .   The patient denies any recent surgeries, prolonged bed rest, or long trips. There is a family history of a blood clot in the patient's father. The patient reports bilateral ankle swelling.   In addition to the pulmonary embolism, the patient reports unintentional weight loss of 24 pounds over the past 2-3 months without any changes in diet or exercise.  It is presumed to be from her gastric sleeve procedure.  The patient also reports changes in bowel habits, with increased frequency and loose stools over the past two weeks. The patient denies any chest pain or acid reflux symptoms.   First episode of PE on 11/17/2023 with no clear provoking  factors. Shortness of breath and bilateral ankle swelling reported. Family history of blood clots.   On her initial consultation with us  on 12/18/2023, we obtained hypercoagulable workup. Prothrombin gene mutation, factor V Leiden mutation, beta-2  glycoprotein antibodies, anticardiolipin antibodies, lupus anticoagulant were all negative.  D-dimer was undetectable.  Rest of the hypercoagulable workup was deferred since it can be falsely abnormal given recent embolism.    Patient has had trouble filling her Xarelto  prescription as it was not approved by her insurance and she had to pay out-of-pocket.  We switched her to Eliquis  5 mg p.o. twice daily and this was covered by her insurance.   At the minimum 6 months of anticoagulation was recommended.   She is up-to-date on age-appropriate cancer screening.  On 05/28/2024, protein C activity, protein S activity, Antithrombin III activity were all within normal limits.  Repeat CT chest angiogram on 06/04/2024 showed no evidence of pulmonary embolism.  Patient preferred to come off of anticoagulation.  She cannot take aspirin because of anaphylactic reaction previously.  Hence close surveillance with monitoring for any concerning symptoms was recommended.  REVIEW OF SYSTEMS:    Review of Systems - Oncology  All other pertinent systems were reviewed with the patient and are negative.  I have reviewed the past medical history, past surgical history, social history and family history with the patient and they are unchanged from previous note.  ALLERGIES:  She is allergic to aspirin and bee venom.  MEDICATIONS:  Current Outpatient Medications  Medication Sig Dispense Refill   albuterol  (VENTOLIN  HFA) 108 (90 Base) MCG/ACT inhaler Inhale 2 puffs into the lungs every  6 (six) hours as needed for wheezing or shortness of breath. 8 g 1   amitriptyline  (ELAVIL ) 50 MG tablet Take 1 tablet (50 mg total) by mouth at bedtime. 90 tablet 3   amLODipine  (NORVASC ) 5  MG tablet Take 1 tablet (5 mg total) by mouth daily. 30 tablet 1   apixaban  (ELIQUIS ) 5 MG TABS tablet Take 1 tablet (5 mg total) by mouth 2 (two) times daily. 60 tablet 4   busPIRone  (BUSPAR ) 30 MG tablet Take 1 tablet (30 mg total) by mouth in the morning and at bedtime. 60 tablet 3   cetirizine  (ZYRTEC ) 10 MG tablet TAKE 1 TABLET(10 MG) BY MOUTH DAILY 30 tablet 0   clonazePAM  (KLONOPIN ) 0.5 MG tablet TAKE ONE TABLET BY MOUTH EVERY NIGHT AT BEDTIME 30 tablet 3   Cyanocobalamin  (VITAMIN B-12 PO) Take by mouth.     DULoxetine  (CYMBALTA ) 60 MG capsule TAKE 1 CAPSULE DAILY FOR 2 WEEKS AND THEN INCREASE TO 1 CAPSULE TWICE DAILY 180 capsule 3   meloxicam  (MOBIC ) 15 MG tablet Take 1 tablet (15 mg total) by mouth daily. 30 tablet 11   Multiple Vitamin (MULTIVITAMIN) capsule Take 1 capsule by mouth daily. With iron     omeprazole  (PRILOSEC) 20 MG capsule Take 1 capsule (20 mg total) by mouth daily. 30 capsule 3   SUMAtriptan  (IMITREX ) 50 MG tablet Take 1 tablet (50 mg total) by mouth every 2 (two) hours as needed for migraine. May repeat in 2 hours if headache persists or recurs. 10 tablet 6   traMADol  (ULTRAM ) 50 MG tablet Take 2 tablets (100 mg total) by mouth in the morning, at noon, and at bedtime. 180 tablet 0   Vitamin D , Ergocalciferol , (DRISDOL ) 1.25 MG (50000 UNIT) CAPS capsule Take 1 capsule (50,000 Units total) by mouth every 7 (seven) days. 12 capsule 0   VITAMIN E  PO Take by mouth.     No current facility-administered medications for this visit.    PHYSICAL EXAMINATION:    Onc Performance Status - 06/11/24 1800       ECOG Perf Status   ECOG Perf Status Restricted in physically strenuous activity but ambulatory and able to carry out work of a light or sedentary nature, e.g., light house work, office work      KPS SCALE   KPS % SCORE Able to carry on normal activity, minor s/s of disease          LABORATORY DATA:   I have reviewed the data as listed.  Recent Results (from  the past 2160 hours)  Lipid panel     Status: Abnormal   Collection Time: 03/25/24  2:09 PM  Result Value Ref Range   Cholesterol 197 0 - 200 mg/dL    Comment: ATP III Classification       Desirable:  < 200 mg/dL               Borderline High:  200 - 239 mg/dL          High:  > = 759 mg/dL   Triglycerides 879.9 0.0 - 149.0 mg/dL    Comment: Normal:  <849 mg/dLBorderline High:  150 - 199 mg/dL   HDL 32.09 >60.99 mg/dL   VLDL 75.9 0.0 - 59.9 mg/dL   LDL Cholesterol 893 (H) 0 - 99 mg/dL   Total CHOL/HDL Ratio 3     Comment:                Men  Women1/2 Average Risk     3.4          3.3Average Risk          5.0          4.42X Average Risk          9.6          7.13X Average Risk          15.0          11.0                       NonHDL 129.55     Comment: NOTE:  Non-HDL goal should be 30 mg/dL higher than patient's LDL goal (i.e. LDL goal of < 70 mg/dL, would have non-HDL goal of < 100 mg/dL)  Basic metabolic panel with GFR     Status: Abnormal   Collection Time: 03/25/24  2:09 PM  Result Value Ref Range   Sodium 140 135 - 145 mEq/L   Potassium 4.0 3.5 - 5.1 mEq/L   Chloride 102 96 - 112 mEq/L   CO2 31 19 - 32 mEq/L   Glucose, Bld 104 (H) 70 - 99 mg/dL   BUN 12 6 - 23 mg/dL   Creatinine, Ser 9.11 0.40 - 1.20 mg/dL   GFR 31.04 >39.99 mL/min    Comment: Calculated using the CKD-EPI Creatinine Equation (2021)   Calcium 9.7 8.4 - 10.5 mg/dL  TSH     Status: None   Collection Time: 03/25/24  2:09 PM  Result Value Ref Range   TSH 0.66 0.35 - 5.50 uIU/mL  Hepatic function panel     Status: None   Collection Time: 03/25/24  2:09 PM  Result Value Ref Range   Total Bilirubin 0.5 0.2 - 1.2 mg/dL   Bilirubin, Direct 0.1 0.0 - 0.3 mg/dL   Alkaline Phosphatase 79 39 - 117 U/L   AST 15 0 - 37 U/L   ALT 11 0 - 35 U/L   Total Protein 7.2 6.0 - 8.3 g/dL   Albumin 4.3 3.5 - 5.2 g/dL  CBC with Differential/Platelet     Status: Abnormal   Collection Time: 03/25/24  2:09 PM  Result Value Ref  Range   WBC 3.6 (L) 4.0 - 10.5 K/uL   RBC 4.41 3.87 - 5.11 Mil/uL   Hemoglobin 13.2 12.0 - 15.0 g/dL   HCT 59.6 63.9 - 53.9 %   MCV 91.3 78.0 - 100.0 fl   MCHC 32.7 30.0 - 36.0 g/dL   RDW 86.5 88.4 - 84.4 %   Platelets 269.0 150.0 - 400.0 K/uL   Neutrophils Relative % 60.9 43.0 - 77.0 %   Lymphocytes Relative 29.3 12.0 - 46.0 %   Monocytes Relative 6.7 3.0 - 12.0 %   Eosinophils Relative 2.4 0.0 - 5.0 %   Basophils Relative 0.7 0.0 - 3.0 %   Neutro Abs 2.2 1.4 - 7.7 K/uL   Lymphs Abs 1.1 0.7 - 4.0 K/uL   Monocytes Absolute 0.2 0.1 - 1.0 K/uL   Eosinophils Absolute 0.1 0.0 - 0.7 K/uL   Basophils Absolute 0.0 0.0 - 0.1 K/uL  VITAMIN D  25 Hydroxy (Vit-D Deficiency, Fractures)     Status: Abnormal   Collection Time: 03/25/24  2:09 PM  Result Value Ref Range   VITD 28.37 (L) 30.00 - 100.00 ng/mL  Hepatitis C Antibody     Status: None   Collection Time: 03/25/24  2:09 PM  Result Value Ref Range  Hepatitis C Ab NON-REACTIVE NON-REACTIVE    Comment: . HCV antibody was non-reactive. There is no laboratory  evidence of HCV infection. . In most cases, no further action is required. However, if recent HCV exposure is suspected, a test for HCV RNA (test code 64354) is suggested. . For additional information please refer to http://education.questdiagnostics.com/faq/FAQ22v1 (This link is being provided for informational/ educational purposes only.) .   D-dimer, quantitative     Status: Abnormal   Collection Time: 05/28/24 10:51 AM  Result Value Ref Range   D-Dimer, Quant 1.05 (H) 0.00 - 0.50 ug/mL-FEU    Comment: (NOTE) At the manufacturer cut-off value of 0.5 g/mL FEU, this assay has a negative predictive value of 95-100%.This assay is intended for use in conjunction with a clinical pretest probability (PTP) assessment model to exclude pulmonary embolism (PE) and deep venous thrombosis (DVT) in outpatients suspected of PE or DVT. Results should be correlated with clinical  presentation. Performed at [facility], [address], [place], [place] [address]   CBC with Differential (Cancer Center Only)     Status: Abnormal   Collection Time: 05/28/24 10:51 AM  Result Value Ref Range   WBC Count 3.5 (L) 4.0 - 10.5 K/uL   RBC 4.16 3.87 - 5.11 MIL/uL   Hemoglobin 12.2 12.0 - 15.0 g/dL   HCT 61.2 63.9 - 53.9 %   MCV 93.0 80.0 - 100.0 fL   MCH 29.3 26.0 - 34.0 pg   MCHC 31.5 30.0 - 36.0 g/dL   RDW 87.5 88.4 - 84.4 %   Platelet Count 221 150 - 400 K/uL   nRBC 0.0 0.0 - 0.2 %   Neutrophils Relative % 68 %   Neutro Abs 2.4 1.7 - 7.7 K/uL   Lymphocytes Relative 20 %   Lymphs Abs 0.7 0.7 - 4.0 K/uL   Monocytes Relative 9 %   Monocytes Absolute 0.3 0.1 - 1.0 K/uL   Eosinophils Relative 3 %   Eosinophils Absolute 0.1 0.0 - 0.5 K/uL   Basophils Relative 0 %   Basophils Absolute 0.0 0.0 - 0.1 K/uL   Immature Granulocytes 0 %   Abs Immature Granulocytes 0.00 0.00 - 0.07 K/uL    Comment: Performed at [facility], [address], [place], [place] [address]  Antithrombin panel     Status: None   Collection Time: 05/28/24 10:51 AM  Result Value Ref Range   Antithrombin Activity 111 75 - 135 %    Comment: (NOTE) Direct Xa inhibitor anticoagulants such as rivaroxaban , apixaban  and edoxaban will lead to spuriously elevated antithrombin activity levels possibly masking a deficiency.    AT III AG PPP IMM-ACNC 82 72 - 124 %    Comment: (NOTE) This test was developed and its performance characteristics determined by [facility]. It has not been cleared or approved by the Food and Drug Administration. Performed At: [facility] [address] [place], [place] [id number] Jennette Shorter MD Ey:[id number]   PROTEIN S PANEL     Status: None   Collection Time: 05/28/24 10:51 AM  Result Value Ref Range   Protein S Ag, Total 86 60 - 150 %    Comment: (NOTE) This test was developed and its performance  characteristics determined by [facility]. It has not been cleared or approved by the Food and Drug Administration.    Protein S Ag, Free 96 61 - 136 %   Protein S Activity 71 63 - 140 %    Comment: (NOTE)  Protein S activity may be falsely increased (masking an abnormal, low result) in patients receiving direct Xa inhibitor (e.g., rivaroxaban , apixaban , edoxaban) or a direct thrombin inhibitor (e.g., dabigatran) anticoagulant treatment due to assay interference by these drugs. Performed At: [facility] [address] [place], [place] [id number] Jennette Shorter MD Ey:[id number]   Protein C activity     Status: None   Collection Time: 05/28/24 10:51 AM  Result Value Ref Range   Protein C Activity 110 73 - 180 %    Comment: (NOTE) Performed At: [facility] [address] [place], [place] [id number] Jennette Shorter MD Ey:[id number]      RADIOGRAPHIC STUDIES:  I have personally reviewed the radiological images as listed and agree with the findings in the report.  MM 3D DIAGNOSTIC MAMMOGRAM UNILATERAL LEFT BREAST Result Date: 06/08/2024 CLINICAL DATA:  Recall from screening to evaluate a possible left breast mass. EXAM: DIGITAL DIAGNOSTIC UNILATERAL LEFT MAMMOGRAM WITH TOMOSYNTHESIS AND CAD TECHNIQUE: Left digital diagnostic mammography and breast tomosynthesis was performed. The images were evaluated with computer-aided detection. COMPARISON:  Previous exam(s). ACR Breast Density Category b: There are scattered areas of fibroglandular density. FINDINGS: Spot compression tomographic images of the left breast demonstrate no concerning retroareolar mass. There is a persistent small asymmetry in the superior left retroareolar region unchanged from 2018 and therefore considered benign. IMPRESSION: No concerning abnormality in the left retroareolar region. RECOMMENDATION: Recommend continued annual bilateral screening mammographic follow-up. I have discussed the findings and  recommendations with the patient. If applicable, a reminder letter will be sent to the patient regarding the next appointment. BI-RADS CATEGORY  2: Benign. Electronically Signed   By: Toribio Agreste M.D.   On: 06/08/2024 10:46   CT Angio Chest Pulmonary Embolism (PE) W or WO Contrast Result Date: 06/04/2024 CLINICAL DATA:  History of pulmonary embolism. On anticoagulation now with recurrent chest pain. Evaluate for PE. EXAM: CT ANGIOGRAPHY CHEST WITH CONTRAST TECHNIQUE: Multidetector CT imaging of the chest was performed using the standard protocol during bolus administration of intravenous contrast. Multiplanar CT image reconstructions and MIPs were obtained to evaluate the vascular anatomy. RADIATION DOSE REDUCTION: This exam was performed according to the departmental dose-optimization program which includes automated exposure control, adjustment of the mA and/or kV according to patient size and/or use of iterative reconstruction technique. CONTRAST:  75mL OMNIPAQUE  IOHEXOL  350 MG/ML SOLN COMPARISON:  02/15/2016 FINDINGS: Cardiovascular: Satisfactory opacification of the pulmonary arteries to the segmental level. No evidence of pulmonary embolism. Normal heart size. No pericardial effusion. Aortic atherosclerotic calcification. Mediastinum/Nodes: No enlarged mediastinal, hilar, or axillary lymph nodes. Thyroid  gland, trachea, and esophagus demonstrate no significant findings. Lungs/Pleura: No pleural effusion. No airspace consolidation, atelectasis or pneumothorax. No signs of interstitial edema. Mild dependent changes noted within the posterior lower lobes. Central airways appear grossly patent. Upper Abdomen: No acute abnormality. Postoperative changes involving the stomach. Musculoskeletal: No chest wall abnormality. No acute or significant osseous findings. Dorsal column stimulator is identified within the thoracic canal. Postoperative change noted within the cervical spine. Review of the MIP images  confirms the above findings. IMPRESSION: 1. No evidence for acute pulmonary embolus. 2. No acute cardiopulmonary abnormalities. 3.  Aortic Atherosclerosis (ICD10-I70.0). Electronically Signed   By: Waddell Calk M.D.   On: 06/04/2024 07:56   MM 3D SCREENING MAMMOGRAM BILATERAL BREAST Result Date: 06/01/2024 CLINICAL DATA:  Screening. EXAM: DIGITAL SCREENING BILATERAL MAMMOGRAM WITH TOMOSYNTHESIS AND CAD TECHNIQUE: Bilateral screening digital craniocaudal and mediolateral oblique mammograms were obtained. Bilateral screening digital breast tomosynthesis  was performed. The images were evaluated with computer-aided detection. COMPARISON:  Previous exam(s). ACR Breast Density Category b: There are scattered areas of fibroglandular density. FINDINGS: In the left breast, a possible mass warrants further evaluation. In the right breast, no findings suspicious for malignancy. IMPRESSION: Further evaluation is suggested for a possible mass in the left breast. RECOMMENDATION: Diagnostic mammogram and possibly ultrasound of the left breast. (Code:[id number]) The patient will be contacted regarding the findings, and additional imaging will be scheduled. BI-RADS CATEGORY  0: Incomplete: Need additional imaging evaluation. Electronically Signed   By: Reyes Phi M.D.   On: 06/01/2024 08:41     Orders Placed This Encounter  Procedures   CBC with Differential (Cancer Center Only)    Standing Status:   Future    Expiration Date:   06/11/2025   D-dimer, quantitative    Standing Status:   Future    Expiration Date:   06/11/2025     Future Appointments  Date Time Provider Department Center  06/25/2024 11:00 AM Dolphus Reiter, MD [facility] None  10/01/2024 10:15 AM [facility] None  10/01/2024 10:45 AM Laterrance Nauta, [place], MD [facility] None  11/05/2024  1:00 PM Mahlon Comer BRAVO, MD [id number]    This document was completed utilizing speech recognition software. Grammatical errors, random word  insertions, pronoun errors, and incomplete sentences are an occasional consequence of this system due to software limitations, ambient noise, and hardware issues. Any formal questions or concerns about the content, text or information contained within the body of this dictation should be directly addressed to the provider for clarification.

## 2024-06-11 NOTE — Assessment & Plan Note (Signed)
 First episode of PE on 11/17/2023 with no clear provoking factors. Shortness of breath and bilateral ankle swelling reported. Family history of blood clots.  On her initial consultation with us  on 12/18/2023, we obtained hypercoagulable workup. Prothrombin gene mutation, factor V Leiden mutation, beta-2  glycoprotein antibodies, anticardiolipin antibodies, lupus anticoagulant were all negative.  D-dimer was undetectable.  Rest of the hypercoagulable workup was deferred since it can be falsely abnormal given recent embolism.    Patient has had trouble filling her Xarelto  prescription as it was not approved by her insurance and she had to pay out-of-pocket.  We switched her to Eliquis  5 mg p.o. twice daily and this was covered by her insurance.  D-dimer was previously undetectable.  CBCD unremarkable except for chronic mild leukopenia with white count of 3500, normal differential.  On 05/28/2024, protein C activity, protein S activity, Antithrombin III activity were all within normal limits.  Repeat CT chest angiogram on 06/04/2024 showed no evidence of pulmonary embolism.  Patient preferred to come off of anticoagulation.  She cannot take aspirin because of anaphylactic reaction previously.  Hence close surveillance with monitoring for any concerning symptoms was recommended.  She is up-to-date on age-appropriate cancer screening.

## 2024-06-13 NOTE — Progress Notes (Signed)
 Office Visit Note  Patient: Linda Buckley             Date of Birth: [birth year]           MRN: [id number]             PCP: Mahlon Comer BRAVO, MD Referring: Mahlon Comer BRAVO, MD Visit Date: 06/25/2024 Occupation: @GUAROCC @  Subjective:  Pain in both hands   History of Present Illness: Linda Buckley is a 68 y.o. female with osteoarthritis, fibromyalgia and positive ANA.  She returns today after her last visit in July 2024.  Patient states she has been having increased pain and discomfort in her both hands.  She states the pain has been severe recently.  She has been using compression gloves which do not help much.  Continues to have pain and discomfort in her legs and generalized achiness. She had right knee joint cortisone injection by Dr. Curtis on February 09, 2024. She was diagnosed with pulmonary embolism in December 2024.  She had hematology workup which was negative.  She was on anticoagulant for some time and was followed by hematology.  She remains on meloxicam  and tramadol  by Dr. Curtis.  She is also on Cymbalta  and amitriptyline .  Activities of Daily Living:  Patient reports morning stiffness for 5-10 minutes.   Patient Reports nocturnal pain.  Difficulty dressing/grooming: Denies Difficulty climbing stairs: Reports Difficulty getting out of chair: Reports Difficulty using hands for taps, buttons, cutlery, and/or writing: Reports  Review of Systems  Constitutional:  Positive for fatigue.  HENT:  Positive for mouth dryness. Negative for mouth sores.   Eyes:  Positive for dryness.  Respiratory:  Negative for shortness of breath and difficulty breathing.   Cardiovascular:  Positive for palpitations.  Gastrointestinal:  Negative for blood in stool, constipation and diarrhea.  Endocrine: Negative for increased urination.  Genitourinary:  Negative for involuntary urination.  Musculoskeletal:  Positive for joint pain, gait problem, joint pain, joint  swelling, myalgias, muscle weakness, morning stiffness, muscle tenderness and myalgias.  Skin:  Positive for hair loss and sensitivity to sunlight. Negative for color change and rash.  Allergic/Immunologic: Positive for susceptible to infections.  Neurological:  Negative for dizziness and headaches.  Hematological:  Negative for swollen glands.  Psychiatric/Behavioral:  Positive for depressed mood and sleep disturbance. The patient is not nervous/anxious.     PMFS History:  Patient Active Problem List   Diagnosis Date Noted   Gastroesophageal reflux disease 06/04/2024   History of weight loss 12/19/2023   PE (pulmonary thromboembolism) (HCC) 11/21/2023   Chronic pain syndrome 07/10/2023   Migraine headache 05/28/2023   Medial epicondylitis, left 05/23/2023   Elevated antinuclear antibody (ANA) level 05/26/2022   Easy bruising 05/26/2022   Right ankle pain 11/23/2021   Primary osteoarthritis of right knee 08/16/2021   Lumbar spondylosis 07/03/2021   Poor compliance with CPAP treatment 02/01/2020   OSA on CPAP 02/01/2020   Chronic left shoulder pain 09/17/2019   MDD (major depressive disorder), recurrent episode, moderate (HCC) 05/05/2018   Vitamin D  deficiency 11/05/2017   Multiple falls 09/04/2016   Hereditary and idiopathic peripheral neuropathy 04/03/2016   Left leg pain 01/26/2016   Primary osteoarthritis of both hips 12/19/2015   Fibromyalgia 07/28/2015   Right wrist pain 04/05/2015   Physical exam 03/01/2014   HTN (hypertension) 01/12/2014   Obesity (BMI 30-39.9) 01/12/2014   Pre-diabetes 01/12/2014   Family history of lupus erythematosus 01/12/2014   Fatigue 10/17/2013    Past Medical  History:  Diagnosis Date   Allergy    Anxiety    Arthritis    Carpal tunnel syndrome of right wrist    RECURRENT   Depression    Dysphagia 09/02/2017   Edema 09/02/2017   Environmental allergies    dust mites, pollen, roaches and other insects, mold   Fibromyalgia 04/2016    GERD (gastroesophageal reflux disease) 2015   Grieving 05/23/2023   Headache    migraines   Heart murmur    Hereditary and idiopathic peripheral neuropathy 04/03/2016   History of adenomatous polyp of colon    History of gastric ulcer    History of kidney stones    Hypertension    Pneumonia    Sickle cell anemia (HCC) 1970   As a chil early 68 years old was told that I was a anemic   Sleep apnea    wears CPAP   Ulcer 2000   Somewhere about   Wears glasses     Family History  Problem Relation Age of Onset   Arthritis Mother    Hypertension Mother    Diabetes Mother    Cancer Mother    Stomach cancer Mother    Asthma Father    Diabetes Father    Diabetes Brother    Asthma Brother    Diabetes Brother    Cancer Maternal Aunt        breast   Cancer Maternal Uncle        lung   Colon cancer Maternal Uncle    Cancer Maternal Grandmother        ovarian   Cancer Maternal Grandfather        colon   Colon cancer Maternal Grandfather    Arthritis Maternal Grandfather    Diabetes Maternal Grandfather    Asthma Son    Alcohol abuse Son    Anxiety disorder Son    Schizophrenia Son    Depression Son    Hypertension Son    Diabetes Son    Depression Son    Asthma Son    Diabetes Son    Asthma Son    Diabetes Son    Esophageal cancer Neg Hx    Past Surgical History:  Procedure Laterality Date   ABDOMINAL HYSTERECTOMY  2010   Or about   CARPAL TUNNEL RELEASE Right 2007   CARPAL TUNNEL RELEASE Right 07/05/2015   Procedure: RIGHT HAND REVISION CARPAL TUNNEL RELEASE;  Surgeon: Prentice Pagan, MD;  Location: [facility] OR;  Service: Orthopedics;  Laterality: Right;   CARPAL TUNNEL RELEASE Left 09/2016   CERVICAL DISC ARTHROPLASTY  03/2015   COLONOSCOPY W/ POLYPECTOMY  04/15/2014   CYSTO/  RIGHT RETROGRADE PYELOGRAM/ URETEROSCOPY STONE EXTRACTION/  STENT PLACEMENT  06/07/2010   FINGER ARTHRODESIS Right 08/10/2018   Procedure: RIGHT THUMB ARTHRODESIS;  Surgeon: Sebastian Lenis, MD;   Location: [facility];  Service: Orthopedics;  Laterality: Right;   LAPAROSCOPIC GASTRIC BAND REMOVAL WITH LAPAROSCOPIC GASTRIC SLEEVE RESECTION  10/26/2018   LAPAROSCOPIC TOTAL HYSTERECTOMY  04/25/2004   ovaries remain   SPINAL CORD STIMULATOR IMPLANT  02/25/2018   [employer] Model- [phone] [email or web address]   SPINE SURGERY  2014   TUBAL LIGATION  1982   UPPER GASTROINTESTINAL ENDOSCOPY  12/08/2017   Social History   Social History Narrative   Lives at home w/ her husband   Right-handed   Drinks 2 cups of coffee weekly    3 bottle water per day   Immunization History  Administered Date(s)  Administered   Influenza,inj,Quad PF,6+ Mos 09/15/2014, 10/24/2015, 09/08/2017, 01/29/2019   Influenza-Unspecified 09/15/2014, 10/24/2015, 09/08/2017, 01/29/2019, 12/25/2020, 10/12/2021, 10/14/2022   Moderna Covid-19 Fall Seasonal Vaccine 61yrs & older 11/13/2022   Moderna SARS-COV2 Booster Vaccination 10/12/2021   Moderna Sars-Covid-2 Vaccination 10/12/2021, 11/13/2022   PFIZER Comirnaty(Gray Top)Covid-19 Tri-Sucrose Vaccine 07/20/2021   PFIZER(Purple Top)SARS-COV-2 Vaccination 02/29/2020, 03/21/2020, 10/10/2020, 01/23/2022   PNEUMOCOCCAL CONJUGATE-20 03/28/2024   Pfizer Covid-19 Vaccine Bivalent Booster 44yrs & up 01/23/2022   Pfizer(Comirnaty)Fall Seasonal Vaccine 12 years and older 03/28/2024   Rsv, Bivalent, Protein Subunit Rsvpref,pf [place]) 04/20/2024   Tdap 11/05/2017   Unspecified SARS-COV-2 Vaccination 09/05/2023   Zoster Recombinant(Shingrix) 02/11/2022, 04/18/2022     Objective: Vital Signs: BP 127/85 (BP Location: Left Arm, Patient Position: Sitting, Cuff Size: Large)   Pulse 66   Resp 14   Ht 5' 6 (1.676 m)   Wt 217 lb (98.4 kg)   BMI 35.02 kg/m    Physical Exam Vitals and nursing note reviewed.  Constitutional:      Appearance: She is well-developed.  HENT:     Head: Normocephalic and atraumatic.   Eyes:     Conjunctiva/sclera: Conjunctivae normal.  Cardiovascular:     Rate and Rhythm: Normal rate and regular rhythm.     Heart sounds: Normal heart sounds.  Pulmonary:     Effort: Pulmonary effort is normal.     Breath sounds: Normal breath sounds.  Abdominal:     General: Bowel sounds are normal.     Palpations: Abdomen is soft.  Musculoskeletal:     Cervical back: Normal range of motion.  Lymphadenopathy:     Cervical: No cervical adenopathy.  Skin:    General: Skin is warm and dry.     Capillary Refill: Capillary refill takes less than 2 seconds.  Neurological:     Mental Status: She is alert and oriented to person, place, and time.  Psychiatric:        Behavior: Behavior normal.      Musculoskeletal Exam: Cervical, thoracic and lumbar spine with good range of motion.  She had discomfort range of motion of her lumbar spine.  Shoulder joints, elbow joints, wrist joints with good range of motion.  She tenderness across MCPs PIPs and DIPs with no synovitis was noted.  Hip joints and knee joints in good range of motion.  She discomfort range of motion of her right knee joint without any warmth swelling or effusion.  There was no tenderness over ankles or MTPs.  CDAI Exam: CDAI Score: -- Patient Global: --; Provider Global: -- Swollen: --; Tender: -- Joint Exam 06/25/2024   No joint exam has been documented for this visit   There is currently no information documented on the homunculus. Go to the Rheumatology activity and complete the homunculus joint exam.  Investigation: No additional findings.  Imaging: MM 3D DIAGNOSTIC MAMMOGRAM UNILATERAL LEFT BREAST Result Date: 06/08/2024 CLINICAL DATA:  Recall from screening to evaluate a possible left breast mass. EXAM: DIGITAL DIAGNOSTIC UNILATERAL LEFT MAMMOGRAM WITH TOMOSYNTHESIS AND CAD TECHNIQUE: Left digital diagnostic mammography and breast tomosynthesis was performed. The images were evaluated with computer-aided  detection. COMPARISON:  Previous exam(s). ACR Breast Density Category b: There are scattered areas of fibroglandular density. FINDINGS: Spot compression tomographic images of the left breast demonstrate no concerning retroareolar mass. There is a persistent small asymmetry in the superior left retroareolar region unchanged from 2018 and therefore considered benign. IMPRESSION: No concerning abnormality in the left retroareolar region. RECOMMENDATION: Recommend continued annual  bilateral screening mammographic follow-up. I have discussed the findings and recommendations with the patient. If applicable, a reminder letter will be sent to the patient regarding the next appointment. BI-RADS CATEGORY  2: Benign. Electronically Signed   By: Toribio Agreste M.D.   On: 06/08/2024 10:46   CT Angio Chest Pulmonary Embolism (PE) W or WO Contrast Result Date: 06/04/2024 CLINICAL DATA:  History of pulmonary embolism. On anticoagulation now with recurrent chest pain. Evaluate for PE. EXAM: CT ANGIOGRAPHY CHEST WITH CONTRAST TECHNIQUE: Multidetector CT imaging of the chest was performed using the standard protocol during bolus administration of intravenous contrast. Multiplanar CT image reconstructions and MIPs were obtained to evaluate the vascular anatomy. RADIATION DOSE REDUCTION: This exam was performed according to the departmental dose-optimization program which includes automated exposure control, adjustment of the mA and/or kV according to patient size and/or use of iterative reconstruction technique. CONTRAST:  75mL OMNIPAQUE  IOHEXOL  350 MG/ML SOLN COMPARISON:  02/15/2016 FINDINGS: Cardiovascular: Satisfactory opacification of the pulmonary arteries to the segmental level. No evidence of pulmonary embolism. Normal heart size. No pericardial effusion. Aortic atherosclerotic calcification. Mediastinum/Nodes: No enlarged mediastinal, hilar, or axillary lymph nodes. Thyroid  gland, trachea, and esophagus demonstrate no  significant findings. Lungs/Pleura: No pleural effusion. No airspace consolidation, atelectasis or pneumothorax. No signs of interstitial edema. Mild dependent changes noted within the posterior lower lobes. Central airways appear grossly patent. Upper Abdomen: No acute abnormality. Postoperative changes involving the stomach. Musculoskeletal: No chest wall abnormality. No acute or significant osseous findings. Dorsal column stimulator is identified within the thoracic canal. Postoperative change noted within the cervical spine. Review of the MIP images confirms the above findings. IMPRESSION: 1. No evidence for acute pulmonary embolus. 2. No acute cardiopulmonary abnormalities. 3.  Aortic Atherosclerosis (ICD10-I70.0). Electronically Signed   By: Waddell Calk M.D.   On: 06/04/2024 07:56   MM 3D SCREENING MAMMOGRAM BILATERAL BREAST Result Date: 06/01/2024 CLINICAL DATA:  Screening. EXAM: DIGITAL SCREENING BILATERAL MAMMOGRAM WITH TOMOSYNTHESIS AND CAD TECHNIQUE: Bilateral screening digital craniocaudal and mediolateral oblique mammograms were obtained. Bilateral screening digital breast tomosynthesis was performed. The images were evaluated with computer-aided detection. COMPARISON:  Previous exam(s). ACR Breast Density Category b: There are scattered areas of fibroglandular density. FINDINGS: In the left breast, a possible mass warrants further evaluation. In the right breast, no findings suspicious for malignancy. IMPRESSION: Further evaluation is suggested for a possible mass in the left breast. RECOMMENDATION: Diagnostic mammogram and possibly ultrasound of the left breast. (Code:[id number]) The patient will be contacted regarding the findings, and additional imaging will be scheduled. BI-RADS CATEGORY  0: Incomplete: Need additional imaging evaluation. Electronically Signed   By: Reyes Phi M.D.   On: 06/01/2024 08:41    Recent Labs: Lab Results  Component Value Date   WBC 3.5 (L) 05/28/2024   HGB  12.2 05/28/2024   PLT 221 05/28/2024   NA 140 03/25/2024   K 4.0 03/25/2024   CL 102 03/25/2024   CO2 31 03/25/2024   GLUCOSE 104 (H) 03/25/2024   BUN 12 03/25/2024   CREATININE 0.88 03/25/2024   BILITOT 0.5 03/25/2024   ALKPHOS 79 03/25/2024   AST 15 03/25/2024   ALT 11 03/25/2024   PROT 7.2 03/25/2024   ALBUMIN 4.3 03/25/2024   CALCIUM 9.7 03/25/2024   GFRAA >60 08/04/2018      Speciality Comments: No specialty comments available.  Procedures:  No procedures performed Allergies: Aspirin and Bee venom   Assessment / Plan:     Visit Diagnoses: Pain in  both hands -she has been experiencing increased pain and discomfort in bilateral hands.  No synovitis was noted.  I will obtain following labs today.  Plan: ANA, Anti-DNA antibody, double-stranded, Rheumatoid factor, Cyclic citrul peptide antibody, IgG, Uric acid  Primary osteoarthritis of both hands -she had bilateral PIP and DIP thickening.  Status post fusion of her right first MCP joint.  X-rays obtained in the past were consistent with osteoarthritis.  Joint protection muscle strengthening was discussed.  A handout on hand exercises was given.  Primary osteoarthritis of right hip-she has some discomfort range of motion but had good range of motion of her hip joints.  Trochanteric bursitis of both hips -no tenderness was noted today.  Bilateral trochanteric bursa injections in January 2024.  Primary osteoarthritis of both knees-she complains of ongoing pain and discomfort in her knee joints.  She had right knee joint injection by Dr. Curtis recently which helped.  Primary osteoarthritis of both feet-chronic discomfort.  Lumbar spondylosis-she continues to have some discomfort.  She denies any radiculopathy.  Positive ANA (antinuclear antibody) - Low titer positive ANA, ENA negative, complements normal.  She has no clinical features of lupus or related diseases.  I will recheck ANA, double-stranded DNA and complements  today.  She denies any history of oral ulcers, nasal ulcers, malar rash, photosensitivity, Raynaud's or lymphadenopathy.  No synovitis was noted on the examination.November 29, 2022 ENA panel (RNP, SCL 70, [place], [place], [place], dsDNA) negative, C3-C4 normal, CK 69  Pulmonary embolism, other, unspecified chronicity, unspecified whether acute cor pulmonale present [facility]) - December 2024.  She was followed by hematology.  She was on anticoagulant for a while.  Workup was negative.  She is off anticoagulants.December 18, 2023 Lupus anticoagulant negative, beta-2  GP 1 negative, anticardiolipin negative  Fibromyalgia-she continues to have generalized pain and discomfort from fibromyalgia.  She is on Cymbalta , Elavil , methocarbamol and tramadol .  Need for regular exercise and stretching was emphasized.  Other fatigue-laded to fibromyalgia.  Primary hypertension-blood pressure was 127/85.  Other medical problems are listed as follows:  Vitamin D  deficiency  Hereditary and idiopathic peripheral neuropathy  MDD (major depressive disorder), recurrent episode, moderate (HCC)  OSA on CPAP  Family history of lupus erythematosus-cousins, maternal aunt  Orders: Orders Placed This Encounter  Procedures   ANA   Anti-DNA antibody, double-stranded   Rheumatoid factor   Cyclic citrul peptide antibody, IgG   Uric acid   No orders of the defined types were placed in this encounter.    Follow-Up Instructions: Return in about 1 year (around 06/25/2025) for Osteoarthritis.   Maya Nash, MD  Note - This record has been created using Animal nutritionist.  Chart creation errors have been sought, but may not always  have been located. Such creation errors do not reflect on  the standard of medical care.

## 2024-06-15 ENCOUNTER — Telehealth: Payer: Self-pay

## 2024-06-15 NOTE — Telephone Encounter (Signed)
 LVM

## 2024-06-21 ENCOUNTER — Ambulatory Visit: Payer: Self-pay

## 2024-06-21 ENCOUNTER — Encounter: Payer: Self-pay | Admitting: Family Medicine

## 2024-06-21 ENCOUNTER — Ambulatory Visit (INDEPENDENT_AMBULATORY_CARE_PROVIDER_SITE_OTHER): Admitting: Family Medicine

## 2024-06-21 VITALS — BP 102/80 | HR 82 | Temp 98.4°F | Ht 66.0 in | Wt 214.4 lb

## 2024-06-21 DIAGNOSIS — R2231 Localized swelling, mass and lump, right upper limb: Secondary | ICD-10-CM

## 2024-06-21 DIAGNOSIS — T63441A Toxic effect of venom of bees, accidental (unintentional), initial encounter: Secondary | ICD-10-CM

## 2024-06-21 DIAGNOSIS — Z91030 Bee allergy status: Secondary | ICD-10-CM | POA: Diagnosis not present

## 2024-06-21 MED ORDER — PREDNISONE 20 MG PO TABS: 40.0000 mg | ORAL_TABLET | Freq: Every day | ORAL | 0 refills | Status: DC

## 2024-06-21 MED ORDER — EPINEPHRINE 0.3 MG/0.3ML IJ SOAJ: 0.3000 mg | INTRAMUSCULAR | 1 refills | Status: AC | PRN

## 2024-06-21 NOTE — Telephone Encounter (Signed)
 FYI Only or Action Required?: Action required by provider: request for appointment.  Patient was last seen in primary care on 06/04/2024 by Mahlon Comer BRAVO, MD. Called Nurse Triage reporting Insect Bite. Symptoms began several days ago. Interventions attempted: OTC medications: Benadryl  . Symptoms are: gradually worsening.  Triage Disposition: See Physician Within 24 Hours  Patient/caregiver understands and will follow disposition?: YesCopied from CRM [id number]. Topic: Clinical - Red Word Triage >> Jun 21, 2024  8:17 AM Macario HERO wrote: Red Word that prompted transfer to Nurse Triage: Patient stated she got stung on Saturday and not sure what it was but her hand is swollen. Reason for Disposition  [1] Red or very tender (to touch) area AND [2] started over 24 hours after the bite  Answer Assessment - Initial Assessment Questions 1. TYPE of INSECT: What type of insect was it?      Not sure- 2. ONSET: When did you get bitten?      Saturday  3. LOCATION: Where is the insect bite located?      Right thumb 4. REDNESS: Is the area red or pink? If Yes, ask: What size is area of redness? (inches or cm). When did the redness start?     Red and swollen  5. PAIN: Is there any pain? If Yes, ask: How bad is it?  (Scale 1-10; or mild, moderate, severe)     Mild  6. ITCHING: Does it itch? If Yes, ask: How bad is the itch?    - MILD: doesn't interfere with normal activities   - MODERATE-SEVERE: interferes with work, school, sleep, or other activities      mild 7. SWELLING: How big is the swelling? (inches, cm, or compare to coins)     Increased to wrist  8. OTHER SYMPTOMS: Do you have any other symptoms?  (e.g., difficulty breathing, hives)     denies   Pt has taken benadryl .  Swelling is worsening. Hot to touch. Red on thumb area. No breathing difficulty.  Protocols used: Insect Bite-A-AH

## 2024-06-21 NOTE — Patient Instructions (Signed)
 Glad to hear that the swelling has improved some today.  I do think that the area of swelling is consistent with a bee sting, not an allergic reaction at this time.  Swelling should continue to improve tonight, but if any worsening swelling I did print some prednisone  if needed.  If you notice any other rashes, wheezing or shortness of breath, tightness in the throat or other systemic symptoms be seen but this would be unlikely to occur, especially this far out from the bee sting.  I did refill the EpiPen  if needed for any bee sting allergic reaction in the future.  Okay to continue antihistamine such as Allegra or Zyrtec  or Claritin  over-the-counter if needed as Benadryl  can be sometimes too sedating.  Topical cortisone over itchy areas is fine, cool compresses may also be helpful for itching.  Tylenol  if needed for pain.  Take care.   Bee, Wasp, or [employer], Adult Bees, wasps, and hornets are part of a family of insects that sting. Normally, a sting will cause pain, redness, and swelling at the sting site. However, some people have an allergy to these stings, and their reactions can be much more serious. What increases the risk? You may be at a greater risk of getting stung if you: Provoke a stinging insect by swatting or disturbing it. Wear strong-smelling soaps, deodorants, or body sprays. Spend time outdoors near gardens with flowers or fruit trees or in clothes that expose skin. Eat or drink outside. What are the signs or symptoms? The reaction to an insect sting can vary from a mild, normal response to life-threatening anaphylaxis. The sting site is often a red lump in the skin, sometimes with a tiny hole in the center, that may still have the stinger in the center of the wound. Normal reaction A normal reaction is experienced by most people after an insect sting. Symptoms include: Pain, redness, and swelling at the sting site. These can develop over 24-48 hours. Pain, redness, and  swelling that may spread to a larger, connected area beyond the sting site. The spreading can continue over 24-48 hours. Allergic reaction An allergic reaction can vary in severity and includes symptoms in other areas of the body beyond the sting site. People who experience an allergic reaction have a higher risk of having similar or worse symptoms the next time they are stung. Symptoms may include: Hives, itching, and swelling. Abdominal symptoms including cramping, nausea, vomiting, and diarrhea. Severe symptoms that require immediate medical attention include: Chest pain or tightness. Wheezing or trouble breathing. Swelling of the tongue, throat, or lips. Trouble swallowing or hoarse voice. Anaphylactic reaction An anaphylactic reaction is a severe, life-threatening allergy and requires immediate medical attention. The symptoms often include severe allergic reaction symptoms that develop rapidly and lead to: A sudden and sharp drop in blood pressure. Dizziness. Loss of consciousness. How is this diagnosed? This condition is usually diagnosed based on your symptoms and medical history as well as a physical exam. You may have an allergy test to determine if you are allergic to the insect venom. How is this treated? If you were stung by a bee, the stinger and a small sac of venom may be in the wound. Remove the stinger as soon as possible. Do this by brushing across the wound with gauze, a clean fingernail, or a flat card such as a credit card. This can help reduce the severity of your body's reaction to the sting. Normal reactions can be treated with: Washing  the area thoroughly with soap and water. Applying ice to the area to reduce swelling. Oral or topical medicines to help reduce pain and itching, if present. Pay close attention to your symptoms after you have been stung. If possible, have someone stay with you to see if an allergic reaction develops. If allergic symptoms develop, oral  antihistamines can be taken and you will need medical help right away. If you had an allergic reaction before, you may need to: Use an auto-injector pen(pre-filled automatic epinephrine  injection device)at the first sign of an allergic reaction. Get medical help right away because the epinephrine  is short-acting. It is intended to give you more time to get to an emergency room. Follow these instructions at home:  Wash the sting site 2-3 times a day with soap and water as told by your health care provider. Apply or take over-the-counter and prescription medicines only as told by your health care provider. If directed, apply ice to the sting area. Put ice in a plastic bag. Place a towel between your skin and the bag. Leave the ice on for 20 minutes, 2-3 times a day. Do not scratch the sting area. If you had a severe allergic reaction to a sting, you may need to: Wear a medical bracelet or necklace that lists the allergy. Carry an anaphylaxis kit or an epinephrine  auto-injector pen with you at all times. Tell your family members, friends, and coworkers when and how to use it. Use it at the first sign of an allergic reaction. How is this prevented? Avoid swatting at stinging insects and disturbing insect nests. Do not use fragrant soaps or lotions and avoid sitting near flowering plants, if possible. Wear shoes, pants, and long sleeves when spending time outdoors, especially in grassy areas where stinging insects are common. Keep outdoor areas free from nests or hives. Keep food and drink containers covered when eating outdoors. Wear gloves if you are gardening or working outdoors. Find a barrier between you and the insect(s), such as a door, if an attack by a stinging insect or a swarm seems likely. Contact a health care provider if: Your symptoms do not get better in 2-3 days. You have redness, swelling, or pain that spreads beyond the area of the sting. You have a fever. Get help right  away if: You have symptoms of a severe allergic reaction. These include: Chest tightness or pain. Wheezing, or trouble swallowing or breathing. Light-headedness, dizziness, or fainting. Itchy, raised, red patches on the skin beyond the sting site. Abdominal cramping, nausea, vomiting, or diarrhea. Trouble swallowing or a swollen tongue, throat, or lips. These symptoms may be an emergency. Get help right away. Call 911. Do not wait to see if the symptoms will go away. Do not drive yourself to the hospital. Summary Stings from bees, wasps, and hornets can cause pain and swelling, but they are usually not serious. However, some people may have an allergic reaction to a sting. This can cause the symptoms to be more severe. Pay close attention to your symptoms after you have been stung. If possible, have someone stay with you to look for signs of worsening symptoms. Call your health care provider if you have any signs of an allergic reaction. This information is not intended to replace advice given to you by your health care provider. Make sure you discuss any questions you have with your health care provider. Document Revised: 01/29/2022 Document Reviewed: 01/29/2022 [employer] Patient Education  2024 [employer].

## 2024-06-21 NOTE — Progress Notes (Signed)
 Subjective:  Patient ID: Linda Buckley, female    DOB: Mar 27, 1956  Age: 68 y.o. MRN: [id number]  CC:  Chief Complaint  Patient presents with   Insect Bite    Insect bite/sting (right hand, thumb) Saturday. Currently treating with heat and cold therapies as well as itch treatment    HPI Linda Buckley presents for acute visit, pcp is Dr. Mahlon.   R hand/thumb insect bite: 2 days ago at home, felt sting when turning flagpole. Unsure what stung her.  Does have bee sting allergy, but no rash, hives, scratchy throat, wheezing or difficulty swallowing.  Just swelling, sore itchy hand. Some redness and warmth, but that has improved. Swelling has moved up to end of forearm overnight, but swelling in hand better - able to make a fist.  Some clear d/c from wound.  Did not use epipen  - has one but expired.    Tx: hot/cold, neosporin, bendaryl. Alcohol.   History Patient Active Problem List   Diagnosis Date Noted   Gastroesophageal reflux disease 06/04/2024   History of weight loss 12/19/2023   PE (pulmonary thromboembolism) (HCC) 11/21/2023   Chronic pain syndrome 07/10/2023   Migraine headache 05/28/2023   Medial epicondylitis, left 05/23/2023   Elevated antinuclear antibody (ANA) level 05/26/2022   Easy bruising 05/26/2022   Right ankle pain 11/23/2021   Primary osteoarthritis of right knee 08/16/2021   Lumbar spondylosis 07/03/2021   Poor compliance with CPAP treatment 02/01/2020   OSA on CPAP 02/01/2020   Chronic left shoulder pain 09/17/2019   MDD (major depressive disorder), recurrent episode, moderate (HCC) 05/05/2018   Vitamin D  deficiency 11/05/2017   Multiple falls 09/04/2016   Hereditary and idiopathic peripheral neuropathy 04/03/2016   Left leg pain 01/26/2016   Primary osteoarthritis of both hips 12/19/2015   Fibromyalgia 07/28/2015   Right wrist pain 04/05/2015   Physical exam 03/01/2014   HTN (hypertension) 01/12/2014   Obesity (BMI 30-39.9) 01/12/2014    Pre-diabetes 01/12/2014   Family history of lupus erythematosus 01/12/2014   Fatigue 10/17/2013   Past Medical History:  Diagnosis Date   Allergy    Anxiety    Arthritis    Carpal tunnel syndrome of right wrist    RECURRENT   Depression    Dysphagia 09/02/2017   Edema 09/02/2017   Environmental allergies    dust mites, pollen, roaches and other insects, mold   Fibromyalgia 04/2016   GERD (gastroesophageal reflux disease) 2015   Grieving 05/23/2023   Headache    migraines   Heart murmur    Hereditary and idiopathic peripheral neuropathy 04/03/2016   History of adenomatous polyp of colon    History of gastric ulcer    History of kidney stones    Hypertension    Pneumonia    Sickle cell anemia (HCC) 1970   As a chil early 68 years old was told that I was a anemic   Sleep apnea    wears CPAP   Ulcer 2000   Somewhere about   Wears glasses    Past Surgical History:  Procedure Laterality Date   ABDOMINAL HYSTERECTOMY  2010   Or about   CARPAL TUNNEL RELEASE Right 2007   CARPAL TUNNEL RELEASE Right 07/05/2015   Procedure: RIGHT HAND REVISION CARPAL TUNNEL RELEASE;  Surgeon: Prentice Pagan, MD;  Location: [facility] OR;  Service: Orthopedics;  Laterality: Right;   CARPAL TUNNEL RELEASE Left 09/2016   CERVICAL DISC ARTHROPLASTY  03/2015   COLONOSCOPY W/ POLYPECTOMY  04/15/2014  CYSTO/  RIGHT RETROGRADE PYELOGRAM/ URETEROSCOPY STONE EXTRACTION/  STENT PLACEMENT  06/07/2010   FINGER ARTHRODESIS Right 08/10/2018   Procedure: RIGHT THUMB ARTHRODESIS;  Surgeon: Sebastian Lenis, MD;  Location: [facility];  Service: Orthopedics;  Laterality: Right;   LAPAROSCOPIC GASTRIC BAND REMOVAL WITH LAPAROSCOPIC GASTRIC SLEEVE RESECTION  10/26/2018   LAPAROSCOPIC TOTAL HYSTERECTOMY  04/25/2004   ovaries remain   SPINAL CORD STIMULATOR IMPLANT  02/25/2018   [employer] Jude Model- [phone] [email or web address]   SPINE SURGERY  2014   TUBAL  LIGATION  1982   UPPER GASTROINTESTINAL ENDOSCOPY  12/08/2017   Allergies  Allergen Reactions   Aspirin Swelling    Sweating/ swelling of hands and face    Bee Venom Swelling    Swelling at site    Prior to Admission medications   Medication Sig Start Date End Date Taking? Authorizing Provider  albuterol  (VENTOLIN  HFA) 108 (90 Base) MCG/ACT inhaler Inhale 2 puffs into the lungs every 6 (six) hours as needed for wheezing or shortness of breath. 01/29/24  Yes Pasam, Avinash, MD  amitriptyline  (ELAVIL ) 50 MG tablet Take 1 tablet (50 mg total) by mouth at bedtime. 06/10/23  Yes Curtis Debby PARAS, MD  amLODipine  (NORVASC ) 5 MG tablet Take 1 tablet (5 mg total) by mouth daily. 05/07/24  Yes Tabori, Katherine E, MD  apixaban  (ELIQUIS ) 5 MG TABS tablet Take 1 tablet (5 mg total) by mouth 2 (two) times daily. 01/29/24  Yes Pasam, Avinash, MD  busPIRone  (BUSPAR ) 30 MG tablet Take 1 tablet (30 mg total) by mouth in the morning and at bedtime. 03/23/24  Yes Mahlon Comer BRAVO, MD  cetirizine  (ZYRTEC ) 10 MG tablet TAKE 1 TABLET(10 MG) BY MOUTH DAILY 07/25/20  Yes Gladis Elsie BROCKS, PA-C  clonazePAM  (KLONOPIN ) 0.5 MG tablet TAKE ONE TABLET BY MOUTH EVERY NIGHT AT BEDTIME 03/03/24  Yes Tabori, Katherine E, MD  Cyanocobalamin  (VITAMIN B-12 PO) Take by mouth.   Yes [provider]  DULoxetine  (CYMBALTA ) 60 MG capsule TAKE 1 CAPSULE DAILY FOR 2 WEEKS AND THEN INCREASE TO 1 CAPSULE TWICE DAILY 12/01/23  Yes Tabori, Katherine E, MD  meloxicam  (MOBIC ) 15 MG tablet Take 1 tablet (15 mg total) by mouth daily. 05/23/23  Yes Curtis Debby PARAS, MD  Multiple Vitamin (MULTIVITAMIN) capsule Take 1 capsule by mouth daily. With iron   Yes [provider]  omeprazole  (PRILOSEC) 20 MG capsule Take 1 capsule (20 mg total) by mouth daily. 06/04/24  Yes Tabori, Katherine E, MD  SUMAtriptan  (IMITREX ) 50 MG tablet Take 1 tablet (50 mg total) by mouth every 2 (two) hours as needed for migraine. May repeat in 2  hours if headache persists or recurs. 09/05/23  Yes Tabori, Katherine E, MD  traMADol  (ULTRAM ) 50 MG tablet Take 2 tablets (100 mg total) by mouth in the morning, at noon, and at bedtime. 03/26/24  Yes Curtis Debby PARAS, MD  Vitamin D , Ergocalciferol , (DRISDOL ) 1.25 MG (50000 UNIT) CAPS capsule Take 1 capsule (50,000 Units total) by mouth every 7 (seven) days. 03/26/24  Yes Tabori, Katherine E, MD  VITAMIN E  PO Take by mouth.   Yes [provider]   Social History   Socioeconomic History   Marital status: Married    Spouse name: Not on file   Number of children: 2   Years of education: 2 yrs college   Highest education level: Some college, no degree  Occupational History   Occupation: [employer]    CommentMedia planner  Tobacco Use  Smoking status: Never    Passive exposure: Current   Smokeless tobacco: Never  Vaping Use   Vaping status: Never Used  Substance and Sexual Activity   Alcohol use: Yes    Comment: occ   Drug use: No   Sexual activity: Yes    Partners: Male    Birth control/protection: None    Comment: partial hysterectomy  Other Topics Concern   Not on file  Social History Narrative   Lives at home w/ her husband   Right-handed   Drinks 2 cups of coffee weekly    3 bottle water per day   Social Drivers of Health   Financial Resource Strain: Medium Risk (06/02/2024)   Overall Financial Resource Strain (CARDIA)    Difficulty of Paying Living Expenses: Somewhat hard  Food Insecurity: No Food Insecurity (06/02/2024)   Hunger Vital Sign    Worried About Running Out of Food in the Last Year: Never true    Ran Out of Food in the Last Year: Never true  Recent Concern: Food Insecurity - Food Insecurity Present (03/24/2024)   Hunger Vital Sign    Worried About Running Out of Food in the Last Year: Sometimes true    Ran Out of Food in the Last Year: Sometimes true  Transportation Needs: No Transportation Needs (06/02/2024)   PRAPARE - Therapist, art (Medical): No    Lack of Transportation (Non-Medical): No  Physical Activity: Insufficiently Active (06/02/2024)   Exercise Vital Sign    Days of Exercise per Week: 3 days    Minutes of Exercise per Session: 10 min  Stress: No Stress Concern Present (06/02/2024)   [employer] of Occupational Health - Occupational Stress Questionnaire    Feeling of Stress: Only a little  Social Connections: Socially Integrated (06/02/2024)   Social Connection and Isolation Panel    Frequency of Communication with Friends and Family: Twice a week    Frequency of Social Gatherings with Friends and Family: Once a week    Attends Religious Services: More than 4 times per year    Active Member of [employer] or Organizations: Yes    Attends Banker Meetings: 1 to 4 times per year    Marital Status: Married  Catering manager Violence: Not At Risk (11/17/2023)   Received from [facility]   HITS    Over the last 12 months how often did your partner physically hurt you?: Never    Over the last 12 months how often did your partner insult you or talk down to you?: Never    Over the last 12 months how often did your partner threaten you with physical harm?: Never    Over the last 12 months how often did your partner scream or curse at you?: Never    Review of Systems  Per HPI.  Objective:   Vitals:   06/21/24 1404  BP: 102/80  Pulse: 82  Temp: 98.4 F (36.9 C)  SpO2: 99%  Weight: 214 lb 6.4 oz (97.3 kg)  Height: 5' 6 (1.676 m)     Physical Exam Vitals reviewed.  Constitutional:      General: She is not in acute distress.    Appearance: Normal appearance. She is well-developed. She is not ill-appearing, toxic-appearing or diaphoretic.  HENT:     Head: Normocephalic and atraumatic.  Eyes:     Conjunctiva/sclera: Conjunctivae normal.     Pupils: Pupils are equal, round, and reactive to light.  Neck:  Vascular: No carotid bruit.  Cardiovascular:     Rate  and Rhythm: Normal rate and regular rhythm.     Heart sounds: Normal heart sounds.  Pulmonary:     Effort: Pulmonary effort is normal. No respiratory distress.     Breath sounds: Normal breath sounds. No stridor. No wheezing.  Abdominal:     Palpations: Abdomen is soft. There is no pulsatile mass.     Tenderness: There is no abdominal tenderness.  Musculoskeletal:     Right lower leg: No edema.     Left lower leg: No edema.     Comments: Right hand with 1 small puncture wound over thumb, see photos., no exudate, no significant induration, soft tissue swelling of thumb into hand, wrist, minimal into distal forearm.  No erythema, no rash, no urticaria.  Skin:    General: Skin is warm and dry.  Neurological:     Mental Status: She is alert and oriented to person, place, and time.  Psychiatric:        Mood and Affect: Mood normal.        Behavior: Behavior normal.           Assessment & Plan:  Linda Buckley is a 68 y.o. female . Bee sting, accidental or unintentional, initial encounter - Plan: predniSONE  (DELTASONE ) 20 MG tablet  Bee sting allergy - Plan: EPINEPHrine  0.3 mg/0.3 mL IJ SOAJ injection  Localized swelling on right hand Suspected bee or wasp sting of the right hand with expected swelling, itching, pain.  Some increased swelling initially but now has receded since this morning.  Does not appear to be a large local reaction, no systemic symptoms, no sign of anaphylaxis even though she does have a history of bee sting allergy.  Continue symptomatic care discussed at this time with antihistamine, cool compresses, keep area clean and monitor for any increased swelling.  Short course prednisone  provided if that were to occur but potential side effects and risks were discussed.  New EpiPen  prescription given if needed for signs of allergic reaction in the future.  RTC/ER precautions given. Meds ordered this encounter  Medications   EPINEPHrine  0.3 mg/0.3 mL IJ SOAJ injection     Sig: Inject 0.3 mg into the muscle as needed for anaphylaxis.    Dispense:  1 each    Refill:  1   predniSONE  (DELTASONE ) 20 MG tablet    Sig: Take 2 tablets (40 mg total) by mouth daily with breakfast.    Dispense:  6 tablet    Refill:  0   Patient Instructions  Glad to hear that the swelling has improved some today.  I do think that the area of swelling is consistent with a bee sting, not an allergic reaction at this time.  Swelling should continue to improve tonight, but if any worsening swelling I did print some prednisone  if needed.  If you notice any other rashes, wheezing or shortness of breath, tightness in the throat or other systemic symptoms be seen but this would be unlikely to occur, especially this far out from the bee sting.  I did refill the EpiPen  if needed for any bee sting allergic reaction in the future.  Okay to continue antihistamine such as Allegra or Zyrtec  or Claritin  over-the-counter if needed as Benadryl  can be sometimes too sedating.  Topical cortisone over itchy areas is fine, cool compresses may also be helpful for itching.  Tylenol  if needed for pain.  Take care.   Bee, Wasp,  or [employer], Adult Bees, wasps, and hornets are part of a family of insects that sting. Normally, a sting will cause pain, redness, and swelling at the sting site. However, some people have an allergy to these stings, and their reactions can be much more serious. What increases the risk? You may be at a greater risk of getting stung if you: Provoke a stinging insect by swatting or disturbing it. Wear strong-smelling soaps, deodorants, or body sprays. Spend time outdoors near gardens with flowers or fruit trees or in clothes that expose skin. Eat or drink outside. What are the signs or symptoms? The reaction to an insect sting can vary from a mild, normal response to life-threatening anaphylaxis. The sting site is often a red lump in the skin, sometimes with a tiny hole in the center,  that may still have the stinger in the center of the wound. Normal reaction A normal reaction is experienced by most people after an insect sting. Symptoms include: Pain, redness, and swelling at the sting site. These can develop over 24-48 hours. Pain, redness, and swelling that may spread to a larger, connected area beyond the sting site. The spreading can continue over 24-48 hours. Allergic reaction An allergic reaction can vary in severity and includes symptoms in other areas of the body beyond the sting site. People who experience an allergic reaction have a higher risk of having similar or worse symptoms the next time they are stung. Symptoms may include: Hives, itching, and swelling. Abdominal symptoms including cramping, nausea, vomiting, and diarrhea. Severe symptoms that require immediate medical attention include: Chest pain or tightness. Wheezing or trouble breathing. Swelling of the tongue, throat, or lips. Trouble swallowing or hoarse voice. Anaphylactic reaction An anaphylactic reaction is a severe, life-threatening allergy and requires immediate medical attention. The symptoms often include severe allergic reaction symptoms that develop rapidly and lead to: A sudden and sharp drop in blood pressure. Dizziness. Loss of consciousness. How is this diagnosed? This condition is usually diagnosed based on your symptoms and medical history as well as a physical exam. You may have an allergy test to determine if you are allergic to the insect venom. How is this treated? If you were stung by a bee, the stinger and a small sac of venom may be in the wound. Remove the stinger as soon as possible. Do this by brushing across the wound with gauze, a clean fingernail, or a flat card such as a credit card. This can help reduce the severity of your body's reaction to the sting. Normal reactions can be treated with: Washing the area thoroughly with soap and water. Applying ice to the area to  reduce swelling. Oral or topical medicines to help reduce pain and itching, if present. Pay close attention to your symptoms after you have been stung. If possible, have someone stay with you to see if an allergic reaction develops. If allergic symptoms develop, oral antihistamines can be taken and you will need medical help right away. If you had an allergic reaction before, you may need to: Use an auto-injector pen(pre-filled automatic epinephrine  injection device)at the first sign of an allergic reaction. Get medical help right away because the epinephrine  is short-acting. It is intended to give you more time to get to an emergency room. Follow these instructions at home:  Wash the sting site 2-3 times a day with soap and water as told by your health care provider. Apply or take over-the-counter and prescription medicines only as told by  your health care provider. If directed, apply ice to the sting area. Put ice in a plastic bag. Place a towel between your skin and the bag. Leave the ice on for 20 minutes, 2-3 times a day. Do not scratch the sting area. If you had a severe allergic reaction to a sting, you may need to: Wear a medical bracelet or necklace that lists the allergy. Carry an anaphylaxis kit or an epinephrine  auto-injector pen with you at all times. Tell your family members, friends, and coworkers when and how to use it. Use it at the first sign of an allergic reaction. How is this prevented? Avoid swatting at stinging insects and disturbing insect nests. Do not use fragrant soaps or lotions and avoid sitting near flowering plants, if possible. Wear shoes, pants, and long sleeves when spending time outdoors, especially in grassy areas where stinging insects are common. Keep outdoor areas free from nests or hives. Keep food and drink containers covered when eating outdoors. Wear gloves if you are gardening or working outdoors. Find a barrier between you and the insect(s),  such as a door, if an attack by a stinging insect or a swarm seems likely. Contact a health care provider if: Your symptoms do not get better in 2-3 days. You have redness, swelling, or pain that spreads beyond the area of the sting. You have a fever. Get help right away if: You have symptoms of a severe allergic reaction. These include: Chest tightness or pain. Wheezing, or trouble swallowing or breathing. Light-headedness, dizziness, or fainting. Itchy, raised, red patches on the skin beyond the sting site. Abdominal cramping, nausea, vomiting, or diarrhea. Trouble swallowing or a swollen tongue, throat, or lips. These symptoms may be an emergency. Get help right away. Call 911. Do not wait to see if the symptoms will go away. Do not drive yourself to the hospital. Summary Stings from bees, wasps, and hornets can cause pain and swelling, but they are usually not serious. However, some people may have an allergic reaction to a sting. This can cause the symptoms to be more severe. Pay close attention to your symptoms after you have been stung. If possible, have someone stay with you to look for signs of worsening symptoms. Call your health care provider if you have any signs of an allergic reaction. This information is not intended to replace advice given to you by your health care provider. Make sure you discuss any questions you have with your health care provider. Document Revised: 01/29/2022 Document Reviewed: 01/29/2022 Elsevier Patient Education  2024 Elsevier Inc.    Signed,   Reyes Pines, MD [facility], [facility] 06/21/24 2:48 PM

## 2024-06-22 ENCOUNTER — Ambulatory Visit (INDEPENDENT_AMBULATORY_CARE_PROVIDER_SITE_OTHER): Admitting: *Deleted

## 2024-06-22 VITALS — Ht 66.0 in | Wt 214.0 lb

## 2024-06-22 DIAGNOSIS — Z0000 Encounter for general adult medical examination without abnormal findings: Secondary | ICD-10-CM | POA: Diagnosis not present

## 2024-06-22 NOTE — Progress Notes (Signed)
 Subjective:   Linda Buckley is a 68 y.o. female who presents for Medicare Annual (Subsequent) preventive examination.  Visit Complete: Virtual I connected with  [name] on 06/22/24 by a audio enabled telemedicine application and verified that I am speaking with the correct person using two identifiers.  Patient Location: Home  Provider Location: Home Office  I discussed the limitations of evaluation and management by telemedicine. The patient expressed understanding and agreed to proceed.  Vital Signs: Because this visit was a virtual/telehealth visit, some criteria may be missing or patient reported. Any vitals not documented were not able to be obtained and vitals that have been documented are patient reported.  Patient Medicare AWV questionnaire was completed by the patient on 06-21-2024; I have confirmed that all information answered by patient is correct and no changes since this date.  Cardiac Risk Factors include: advanced age (>35men, >70 women);hypertension;obesity (BMI >30kg/m2)     Objective:    Today's Vitals   06/22/24 0814  Weight: 214 lb (97.1 kg)  Height: 5' 6 (1.676 m)  PainSc: 6    Body mass index is 34.54 kg/m.     06/22/2024    8:15 AM 05/28/2024   11:14 AM 01/29/2024   12:15 PM 12/18/2023    2:16 PM 03/05/2023    3:46 PM 09/02/2022   10:23 AM 02/07/2022    1:34 PM  Advanced Directives  Does Patient Have a Medical Advance Directive? No No No No No No Yes  Type of Building surveyor of Healthcare Power of Attorney in Chart?       No - copy requested  Would patient like information on creating a medical advance directive? No - Patient declined No - Patient declined No - Patient declined No - Patient declined No - Patient declined Yes (MAU/Ambulatory/Procedural Areas - Information given)     Current Medications (verified) Outpatient Encounter Medications as of 06/22/2024  Medication Sig   albuterol  (VENTOLIN   HFA) 108 (90 Base) MCG/ACT inhaler Inhale 2 puffs into the lungs every 6 (six) hours as needed for wheezing or shortness of breath.   amitriptyline  (ELAVIL ) 50 MG tablet Take 1 tablet (50 mg total) by mouth at bedtime.   amLODipine  (NORVASC ) 5 MG tablet Take 1 tablet (5 mg total) by mouth daily.   apixaban  (ELIQUIS ) 5 MG TABS tablet Take 1 tablet (5 mg total) by mouth 2 (two) times daily.   busPIRone  (BUSPAR ) 30 MG tablet Take 1 tablet (30 mg total) by mouth in the morning and at bedtime.   cetirizine  (ZYRTEC ) 10 MG tablet TAKE 1 TABLET(10 MG) BY MOUTH DAILY   clonazePAM  (KLONOPIN ) 0.5 MG tablet TAKE ONE TABLET BY MOUTH EVERY NIGHT AT BEDTIME   Cyanocobalamin  (VITAMIN B-12 PO) Take by mouth.   DULoxetine  (CYMBALTA ) 60 MG capsule TAKE 1 CAPSULE DAILY FOR 2 WEEKS AND THEN INCREASE TO 1 CAPSULE TWICE DAILY   EPINEPHrine  0.3 mg/0.3 mL IJ SOAJ injection Inject 0.3 mg into the muscle as needed for anaphylaxis.   meloxicam  (MOBIC ) 15 MG tablet Take 1 tablet (15 mg total) by mouth daily.   Multiple Vitamin (MULTIVITAMIN) capsule Take 1 capsule by mouth daily. With iron   omeprazole  (PRILOSEC) 20 MG capsule Take 1 capsule (20 mg total) by mouth daily.   predniSONE  (DELTASONE ) 20 MG tablet Take 2 tablets (40 mg total) by mouth daily with breakfast.   SUMAtriptan  (IMITREX ) 50 MG tablet Take 1  tablet (50 mg total) by mouth every 2 (two) hours as needed for migraine. May repeat in 2 hours if headache persists or recurs.   traMADol  (ULTRAM ) 50 MG tablet Take 2 tablets (100 mg total) by mouth in the morning, at noon, and at bedtime.   Vitamin D , Ergocalciferol , (DRISDOL ) 1.25 MG (50000 UNIT) CAPS capsule Take 1 capsule (50,000 Units total) by mouth every 7 (seven) days.   VITAMIN E  PO Take by mouth.   No facility-administered encounter medications on file as of 06/22/2024.    Allergies (verified) Aspirin and Bee venom   History: Past Medical History:  Diagnosis Date   Allergy    Anxiety    Arthritis     Carpal tunnel syndrome of right wrist    RECURRENT   Depression    Dysphagia 09/02/2017   Edema 09/02/2017   Environmental allergies    dust mites, pollen, roaches and other insects, mold   Fibromyalgia 04/2016   GERD (gastroesophageal reflux disease) 2015   Grieving 05/23/2023   Headache    migraines   Heart murmur    Hereditary and idiopathic peripheral neuropathy 04/03/2016   History of adenomatous polyp of colon    History of gastric ulcer    History of kidney stones    Hypertension    Pneumonia    Sickle cell anemia (HCC) 1970   As a chil early 68 years old was told that I was a anemic   Sleep apnea    wears CPAP   Ulcer 2000   Somewhere about   Wears glasses    Past Surgical History:  Procedure Laterality Date   ABDOMINAL HYSTERECTOMY  2010   Or about   CARPAL TUNNEL RELEASE Right 2007   CARPAL TUNNEL RELEASE Right 07/05/2015   Procedure: RIGHT HAND REVISION CARPAL TUNNEL RELEASE;  Surgeon: Prentice Pagan, MD;  Location: [facility] OR;  Service: Orthopedics;  Laterality: Right;   CARPAL TUNNEL RELEASE Left 09/2016   CERVICAL DISC ARTHROPLASTY  03/2015   COLONOSCOPY W/ POLYPECTOMY  04/15/2014   CYSTO/  RIGHT RETROGRADE PYELOGRAM/ URETEROSCOPY STONE EXTRACTION/  STENT PLACEMENT  06/07/2010   FINGER ARTHRODESIS Right 08/10/2018   Procedure: RIGHT THUMB ARTHRODESIS;  Surgeon: Sebastian Lenis, MD;  Location: [facility];  Service: Orthopedics;  Laterality: Right;   LAPAROSCOPIC GASTRIC BAND REMOVAL WITH LAPAROSCOPIC GASTRIC SLEEVE RESECTION  10/26/2018   LAPAROSCOPIC TOTAL HYSTERECTOMY  04/25/2004   ovaries remain   SPINAL CORD STIMULATOR IMPLANT  02/25/2018   [employer] Jude Model- [phone] [email or web address]   SPINE SURGERY  2014   TUBAL LIGATION  1982   UPPER GASTROINTESTINAL ENDOSCOPY  12/08/2017   Family History  Problem Relation Age of Onset   Arthritis Mother    Hypertension Mother    Diabetes Mother     Cancer Mother    Stomach cancer Mother    Asthma Father    Diabetes Father    Diabetes Brother    Asthma Brother    Diabetes Brother    Cancer Maternal Aunt        breast   Cancer Maternal Uncle        lung   Colon cancer Maternal Uncle    Cancer Maternal Grandmother        ovarian   Cancer Maternal Grandfather        colon   Colon cancer Maternal Grandfather    Arthritis Maternal Grandfather    Diabetes Maternal Grandfather    Asthma Son    Alcohol abuse  Son    Anxiety disorder Son    Schizophrenia Son    Depression Son    Hypertension Son    Diabetes Son    Depression Son    Asthma Son    Diabetes Son    Asthma Son    Diabetes Son    Esophageal cancer Neg Hx    Social History   Socioeconomic History   Marital status: Married    Spouse name: Not on file   Number of children: 2   Years of education: 2 yrs college   Highest education level: Some college, no degree  Occupational History   Occupation: [employer]    CommentMedia planner  Tobacco Use   Smoking status: Never    Passive exposure: Current   Smokeless tobacco: Never  Vaping Use   Vaping status: Never Used  Substance and Sexual Activity   Alcohol use: Yes    Comment: occ   Drug use: No   Sexual activity: Yes    Partners: Male    Birth control/protection: None    Comment: partial hysterectomy  Other Topics Concern   Not on file  Social History Narrative   Lives at home w/ her husband   Right-handed   Drinks 2 cups of coffee weekly    3 bottle water per day   Social Drivers of Health   Financial Resource Strain: Medium Risk (06/22/2024)   Overall Financial Resource Strain (CARDIA)    Difficulty of Paying Living Expenses: Somewhat hard  Food Insecurity: No Food Insecurity (06/22/2024)   Hunger Vital Sign    Worried About Running Out of Food in the Last Year: Never true    Ran Out of Food in the Last Year: Never true  Recent Concern: Food Insecurity - Food Insecurity Present (03/24/2024)    Hunger Vital Sign    Worried About Running Out of Food in the Last Year: Sometimes true    Ran Out of Food in the Last Year: Sometimes true  Transportation Needs: No Transportation Needs (06/22/2024)   PRAPARE - Administrator, Civil Service (Medical): No    Lack of Transportation (Non-Medical): No  Physical Activity: Insufficiently Active (06/22/2024)   Exercise Vital Sign    Days of Exercise per Week: 3 days    Minutes of Exercise per Session: 10 min  Stress: No Stress Concern Present (06/22/2024)   [employer] of Occupational Health - Occupational Stress Questionnaire    Feeling of Stress: Only a little  Social Connections: Socially Integrated (06/22/2024)   Social Connection and Isolation Panel    Frequency of Communication with Friends and Family: Twice a week    Frequency of Social Gatherings with Friends and Family: Once a week    Attends Religious Services: More than 4 times per year    Active Member of [employer] or Organizations: Yes    Attends Banker Meetings: 1 to 4 times per year    Marital Status: Married    Tobacco Counseling Counseling given: Not Answered   Clinical Intake:  Pre-visit preparation completed: Yes  Pain : 0-10 Pain Score: 6  Pain Type: Chronic pain Pain Location: Knee Pain Orientation: Right Pain Descriptors / Indicators: Burning, Aching, Dull Pain Onset: More than a month ago Pain Frequency: Intermittent Pain Relieving Factors: mobic  , tramadol   Pain Relieving Factors: mobic  , tramadol   Diabetes: No  How often do you need to have someone help you when you read instructions, pamphlets, or other written materials  from your doctor or pharmacy?: 1 - Never  Interpreter Needed?: No  Information entered by :: Mliss Graff LPN   Activities of Daily Living    06/22/2024    8:20 AM 06/21/2024   10:56 AM  In your present state of health, do you have any difficulty performing the following activities:  Hearing? 0 0  Vision? 0  0  Difficulty concentrating or making decisions? 1 1  Walking or climbing stairs? 1 1  Dressing or bathing? 0 0  Doing errands, shopping? 0 0  Preparing Food and eating ? N N  Using the Toilet? N N  In the past six months, have you accidently leaked urine? Y Y  Do you have problems with loss of bowel control? N N  Managing your Medications? N N  Managing your Finances? N N  Housekeeping or managing your Housekeeping? N N    Patient Care Team: Mahlon Comer BRAVO, MD as PCP - General (Family Medicine) Shari Easter, MD as Consulting Physician (Orthopedic Surgery) Bonner Ade, MD as Consulting Physician (Physical Medicine and Rehabilitation) Burnetta Aures, MD as Consulting Physician (Orthopedic Surgery) Teressa Toribio SQUIBB, MD (Inactive) as Attending Physician (Gastroenterology) Jenel Carlin POUR, MD (Inactive) as Consulting Physician (Neurology) Johnson Laymon CHRISTELLA RIGGERS as Physician Assistant (Physician Assistant) Sherryl Bouchard, NP as Registered Nurse (Gerontology) Gable Charleston, PA-C as Physician Assistant (Orthopedic Surgery) Pandora Cadet, [employer] as Pharmacist (Pharmacist)  Indicate any recent Medical Services you may have received from other than [facility] providers in the past year (date may be approximate).     Assessment:   This is a routine wellness examination for [place].  Hearing/Vision screen Hearing Screening - Comments:: No trouble hearing Vision Screening - Comments:: Up to date [facility]   Goals Addressed             This Visit's Progress    Patient Stated   Not on track    Eat healthier , drink more water & increase activity     Patient Stated   Not on track    Be off some medication for pain     Patient Stated       Be able to take do more for myself       Depression Screen    06/22/2024    8:18 AM 05/07/2024    2:25 PM 03/25/2024    1:25 PM 09/05/2023   10:41 AM 05/28/2023   10:31 AM 03/06/2023   12:54 PM 03/05/2023    3:45 PM  PHQ 2/9 Scores   PHQ - 2 Score 2 1 2 3 3 2 3   PHQ- 9 Score 11 6 16 11 15 15 6     Fall Risk    06/22/2024    8:28 AM 06/22/2024    8:13 AM 06/21/2024   10:56 AM 03/25/2024    1:26 PM 11/21/2023   11:03 AM  Fall Risk   Falls in the past year? 1 1 1 1  0  Number falls in past yr: 1  1 1  0  Injury with Fall? 0  0 1 0  Risk for fall due to : Impaired balance/gait    No Fall Risks  Follow up Falls evaluation completed;Education provided;Falls prevention discussed        MEDICARE RISK AT HOME: Medicare Risk at Home Any stairs in or around the home?: Yes If so, are there any without handrails?: No Home free of loose throw rugs in walkways, pet beds, electrical cords, etc?: Yes Adequate lighting  in your home to reduce risk of falls?: Yes Life alert?: No Use of a cane, walker or w/c?: No Grab bars in the bathroom?: Yes Shower chair or bench in shower?: Yes Elevated toilet seat or a handicapped toilet?: No  TIMED UP AND GO:  Was the test performed?  No    Cognitive Function:    02/24/2020    8:00 AM 02/01/2019    7:20 AM  MMSE - Mini Mental State Exam  Orientation to time 5 5  Orientation to Place 5 5  Registration 3 3  Attention/ Calculation 1 5  Recall 1 2  Language- name 2 objects 2 2  Language- repeat 1 0  Language- follow 3 step command 3 3  Language- read & follow direction 1 1  Write a sentence 1 1  Copy design 1 1  Total score 24 28        06/22/2024    8:17 AM 03/05/2023    3:40 PM 02/05/2021   10:48 AM  6CIT Screen  What Year? 0 points 0 points 0 points  What month? 0 points 0 points 3 points  What time? 0 points 0 points 0 points  Count back from 20 0 points 2 points 0 points  Months in reverse 0 points 4 points 0 points  Repeat phrase 2 points 6 points 0 points  Total Score 2 points 12 points 3 points    Immunizations Immunization History  Administered Date(s) Administered   Influenza,inj,Quad PF,6+ Mos 09/15/2014, 10/24/2015, 09/08/2017, 01/29/2019    Influenza-Unspecified 09/15/2014, 10/24/2015, 09/08/2017, 01/29/2019, 12/25/2020, 10/12/2021, 10/14/2022   Moderna Covid-19 Fall Seasonal Vaccine 87yrs & older 11/13/2022   Moderna SARS-COV2 Booster Vaccination 10/12/2021   Moderna Sars-Covid-2 Vaccination 10/12/2021, 11/13/2022   PFIZER Comirnaty(Gray Top)Covid-19 Tri-Sucrose Vaccine 07/20/2021   PFIZER(Purple Top)SARS-COV-2 Vaccination 02/29/2020, 03/21/2020, 10/10/2020, 01/23/2022   PNEUMOCOCCAL CONJUGATE-20 03/28/2024   Pfizer Covid-19 Vaccine Bivalent Booster 45yrs & up 01/23/2022   Pfizer(Comirnaty)Fall Seasonal Vaccine 12 years and older 03/28/2024   Rsv, Bivalent, Protein Subunit Rsvpref,pf [place]) 04/20/2024   Tdap 11/05/2017   Unspecified SARS-COV-2 Vaccination 09/05/2023   Zoster Recombinant(Shingrix) 02/11/2022, 04/18/2022    TDAP status: Up to date  Flu Vaccine status: Up to date  Pneumococcal vaccine status: Up to date  Covid-19 vaccine status: Information provided on how to obtain vaccines.   Qualifies for Shingles Vaccine? No   Zostavax completed Yes   Shingrix Completed?: Yes  Screening Tests Health Maintenance  Topic Date Due   COVID-19 Vaccine (9 - 2024-25 season) 05/23/2024   INFLUENZA VACCINE  07/16/2024   MAMMOGRAM  05/27/2025   Medicare Annual Wellness (AWV)  06/22/2025   DEXA SCAN  09/10/2025   DTaP/Tdap/Td (2 - Td or Tdap) 11/06/2027   Colonoscopy  01/18/2029   Pneumococcal Vaccine: 50+ Years  Completed   Hepatitis C Screening  Completed   HIV Screening  Completed   Zoster Vaccines- Shingrix  Completed   Hepatitis B Vaccines  Aged Out   HPV VACCINES  Aged Out   Meningococcal B Vaccine  Aged Out    Health Maintenance  Health Maintenance Due  Topic Date Due   COVID-19 Vaccine (9 - 2024-25 season) 05/23/2024    Colorectal cancer screening: Type of screening: Colonoscopy. Completed 2023. Repeat every 7 years  Mammogram status: Completed  . Repeat every year  Bone Density status:  Completed 2024. Results reflect: Bone density results: OSTEOPENIA. Repeat every 2 years.  Lung Cancer Screening: (Low Dose CT Chest recommended if Age 20-80 years,  20 pack-year currently smoking OR have quit w/in 15years.) does not qualify.   Lung Cancer Screening Referral:   Additional Screening:  Hepatitis C Screening Completed 2025  Vision Screening: Recommended annual ophthalmology exams for early detection of glaucoma and other disorders of the eye. Is the patient up to date with their annual eye exam?  Yes  Who is the provider or what is the name of the office in which the patient attends annual eye exams? [facility] If pt is not established with a provider, would they like to be referred to a provider to establish care? No .   Dental Screening: Recommended annual dental exams for proper oral hygiene    Community Resource Referral / Chronic Care Management: CRR required this visit?  No   CCM required this visit?  No     Plan:     I have personally reviewed and noted the following in the patient's chart:   Medical and social history Use of alcohol, tobacco or illicit drugs  Current medications and supplements including opioid prescriptions. Patient is not currently taking opioid prescriptions. Functional ability and status Nutritional status Physical activity Advanced directives List of other physicians Hospitalizations, surgeries, and ER visits in previous 12 months Vitals Screenings to include cognitive, depression, and falls Referrals and appointments  In addition, I have reviewed and discussed with patient certain preventive protocols, quality metrics, and best practice recommendations. A written personalized care plan for preventive services as well as general preventive health recommendations were provided to patient.     Mliss Graff, LPN   01/16/7973   After Visit Summary: (MyChart) Due to this being a telephonic visit, the after visit summary with  patients personalized plan was offered to patient via MyChart   Nurse Notes:

## 2024-06-22 NOTE — Patient Instructions (Signed)
 Linda Buckley , Thank you for taking time to come for your Medicare Wellness Visit. I appreciate your ongoing commitment to your health goals. Please review the following plan we discussed and let me know if I can assist you in the future.   Screening recommendations/referrals: Colonoscopy:  Mammogram:  Bone Density:  Recommended yearly ophthalmology/optometry visit for glaucoma screening and checkup Recommended yearly dental visit for hygiene and checkup  Vaccinations: Influenza vaccine:  Pneumococcal vaccine:  Tdap vaccine:  Shingles vaccine:       Preventive Care 65 Years and Older, Female Preventive care refers to lifestyle choices and visits with your health care provider that can promote health and wellness. What does preventive care include? A yearly physical exam. This is also called an annual well check. Dental exams once or twice a year. Routine eye exams. Ask your health care provider how often you should have your eyes checked. Personal lifestyle choices, including: Daily care of your teeth and gums. Regular physical activity. Eating a healthy diet. Avoiding tobacco and drug use. Limiting alcohol use. Practicing safe sex. Taking low-dose aspirin every day. Taking vitamin and mineral supplements as recommended by your health care provider. What happens during an annual well check? The services and screenings done by your health care provider during your annual well check will depend on your age, overall health, lifestyle risk factors, and family history of disease. Counseling  Your health care provider may ask you questions about your: Alcohol use. Tobacco use. Drug use. Emotional well-being. Home and relationship well-being. Sexual activity. Eating habits. History of falls. Memory and ability to understand (cognition). Work and work Astronomer. Reproductive health. Screening  You may have the following tests or measurements: Height, weight, and  BMI. Blood pressure. Lipid and cholesterol levels. These may be checked every 5 years, or more frequently if you are over 97 years old. Skin check. Lung cancer screening. You may have this screening every year starting at age 65 if you have a 30-pack-year history of smoking and currently smoke or have quit within the past 15 years. Fecal occult blood test (FOBT) of the stool. You may have this test every year starting at age 70. Flexible sigmoidoscopy or colonoscopy. You may have a sigmoidoscopy every 5 years or a colonoscopy every 10 years starting at age 55. Hepatitis C blood test. Hepatitis B blood test. Sexually transmitted disease (STD) testing. Diabetes screening. This is done by checking your blood sugar (glucose) after you have not eaten for a while (fasting). You may have this done every 1-3 years. Bone density scan. This is done to screen for osteoporosis. You may have this done starting at age 41. Mammogram. This may be done every 1-2 years. Talk to your health care provider about how often you should have regular mammograms. Talk with your health care provider about your test results, treatment options, and if necessary, the need for more tests. Vaccines  Your health care provider may recommend certain vaccines, such as: Influenza vaccine. This is recommended every year. Tetanus, diphtheria, and acellular pertussis (Tdap, Td) vaccine. You may need a Td booster every 10 years. Zoster vaccine. You may need this after age 43. Pneumococcal 13-valent conjugate (PCV13) vaccine. One dose is recommended after age 52. Pneumococcal polysaccharide (PPSV23) vaccine. One dose is recommended after age 22. Talk to your health care provider about which screenings and vaccines you need and how often you need them. This information is not intended to replace advice given to you by your health care  provider. Make sure you discuss any questions you have with your health care provider. Document  Released: 12/29/2015 Document Revised: 08/21/2016 Document Reviewed: 10/03/2015 Elsevier Interactive Patient Education  2017 [employer].  Fall Prevention in the Home Falls can cause injuries. They can happen to people of all ages. There are many things you can do to make your home safe and to help prevent falls. What can I do on the outside of my home? Regularly fix the edges of walkways and driveways and fix any cracks. Remove anything that might make you trip as you walk through a door, such as a raised step or threshold. Trim any bushes or trees on the path to your home. Use bright outdoor lighting. Clear any walking paths of anything that might make someone trip, such as rocks or tools. Regularly check to see if handrails are loose or broken. Make sure that both sides of any steps have handrails. Any raised decks and porches should have guardrails on the edges. Have any leaves, snow, or ice cleared regularly. Use sand or salt on walking paths during winter. Clean up any spills in your garage right away. This includes oil or grease spills. What can I do in the bathroom? Use night lights. Install grab bars by the toilet and in the tub and shower. Do not use towel bars as grab bars. Use non-skid mats or decals in the tub or shower. If you need to sit down in the shower, use a plastic, non-slip stool. Keep the floor dry. Clean up any water that spills on the floor as soon as it happens. Remove soap buildup in the tub or shower regularly. Attach bath mats securely with double-sided non-slip rug tape. Do not have throw rugs and other things on the floor that can make you trip. What can I do in the bedroom? Use night lights. Make sure that you have a light by your bed that is easy to reach. Do not use any sheets or blankets that are too big for your bed. They should not hang down onto the floor. Have a firm chair that has side arms. You can use this for support while you get dressed. Do  not have throw rugs and other things on the floor that can make you trip. What can I do in the kitchen? Clean up any spills right away. Avoid walking on wet floors. Keep items that you use a lot in easy-to-reach places. If you need to reach something above you, use a strong step stool that has a grab bar. Keep electrical cords out of the way. Do not use floor polish or wax that makes floors slippery. If you must use wax, use non-skid floor wax. Do not have throw rugs and other things on the floor that can make you trip. What can I do with my stairs? Do not leave any items on the stairs. Make sure that there are handrails on both sides of the stairs and use them. Fix handrails that are broken or loose. Make sure that handrails are as long as the stairways. Check any carpeting to make sure that it is firmly attached to the stairs. Fix any carpet that is loose or worn. Avoid having throw rugs at the top or bottom of the stairs. If you do have throw rugs, attach them to the floor with carpet tape. Make sure that you have a light switch at the top of the stairs and the bottom of the stairs. If you do not have  them, ask someone to add them for you. What else can I do to help prevent falls? Wear shoes that: Do not have high heels. Have rubber bottoms. Are comfortable and fit you well. Are closed at the toe. Do not wear sandals. If you use a stepladder: Make sure that it is fully opened. Do not climb a closed stepladder. Make sure that both sides of the stepladder are locked into place. Ask someone to hold it for you, if possible. Clearly mark and make sure that you can see: Any grab bars or handrails. First and last steps. Where the edge of each step is. Use tools that help you move around (mobility aids) if they are needed. These include: Canes. Walkers. Scooters. Crutches. Turn on the lights when you go into a dark area. Replace any light bulbs as soon as they burn out. Set up your  furniture so you have a clear path. Avoid moving your furniture around. If any of your floors are uneven, fix them. If there are any pets around you, be aware of where they are. Review your medicines with your doctor. Some medicines can make you feel dizzy. This can increase your chance of falling. Ask your doctor what other things that you can do to help prevent falls. This information is not intended to replace advice given to you by your health care provider. Make sure you discuss any questions you have with your health care provider. Document Released: 09/28/2009 Document Revised: 05/09/2016 Document Reviewed: 01/06/2015 Elsevier Interactive Patient Education  2017 [employer].

## 2024-06-25 ENCOUNTER — Encounter: Payer: Self-pay | Admitting: Rheumatology

## 2024-06-25 ENCOUNTER — Ambulatory Visit: Payer: Medicare PPO | Attending: Rheumatology | Admitting: Rheumatology

## 2024-06-25 VITALS — BP 127/85 | HR 66 | Resp 14 | Ht 66.0 in | Wt 217.0 lb

## 2024-06-25 DIAGNOSIS — M170 Bilateral primary osteoarthritis of knee: Secondary | ICD-10-CM

## 2024-06-25 DIAGNOSIS — M19041 Primary osteoarthritis, right hand: Secondary | ICD-10-CM | POA: Diagnosis not present

## 2024-06-25 DIAGNOSIS — M19071 Primary osteoarthritis, right ankle and foot: Secondary | ICD-10-CM

## 2024-06-25 DIAGNOSIS — M79642 Pain in left hand: Secondary | ICD-10-CM | POA: Diagnosis not present

## 2024-06-25 DIAGNOSIS — M7061 Trochanteric bursitis, right hip: Secondary | ICD-10-CM | POA: Diagnosis not present

## 2024-06-25 DIAGNOSIS — R768 Other specified abnormal immunological findings in serum: Secondary | ICD-10-CM | POA: Diagnosis not present

## 2024-06-25 DIAGNOSIS — E559 Vitamin D deficiency, unspecified: Secondary | ICD-10-CM

## 2024-06-25 DIAGNOSIS — M79641 Pain in right hand: Secondary | ICD-10-CM

## 2024-06-25 DIAGNOSIS — M797 Fibromyalgia: Secondary | ICD-10-CM

## 2024-06-25 DIAGNOSIS — M19072 Primary osteoarthritis, left ankle and foot: Secondary | ICD-10-CM

## 2024-06-25 DIAGNOSIS — F331 Major depressive disorder, recurrent, moderate: Secondary | ICD-10-CM

## 2024-06-25 DIAGNOSIS — I2699 Other pulmonary embolism without acute cor pulmonale: Secondary | ICD-10-CM

## 2024-06-25 DIAGNOSIS — G609 Hereditary and idiopathic neuropathy, unspecified: Secondary | ICD-10-CM

## 2024-06-25 DIAGNOSIS — M47816 Spondylosis without myelopathy or radiculopathy, lumbar region: Secondary | ICD-10-CM | POA: Diagnosis not present

## 2024-06-25 DIAGNOSIS — M19042 Primary osteoarthritis, left hand: Secondary | ICD-10-CM

## 2024-06-25 DIAGNOSIS — M1611 Unilateral primary osteoarthritis, right hip: Secondary | ICD-10-CM | POA: Diagnosis not present

## 2024-06-25 DIAGNOSIS — M7062 Trochanteric bursitis, left hip: Secondary | ICD-10-CM

## 2024-06-25 DIAGNOSIS — G4733 Obstructive sleep apnea (adult) (pediatric): Secondary | ICD-10-CM

## 2024-06-25 DIAGNOSIS — I10 Essential (primary) hypertension: Secondary | ICD-10-CM

## 2024-06-25 DIAGNOSIS — Z840 Family history of diseases of the skin and subcutaneous tissue: Secondary | ICD-10-CM

## 2024-06-25 DIAGNOSIS — R5383 Other fatigue: Secondary | ICD-10-CM

## 2024-06-25 NOTE — Patient Instructions (Signed)

## 2024-06-29 ENCOUNTER — Ambulatory Visit: Payer: Self-pay | Admitting: Rheumatology

## 2024-06-29 LAB — RHEUMATOID FACTOR: Rheumatoid fact SerPl-aCnc: <10 [IU]/mL (ref <14)

## 2024-06-29 LAB — ANA: Anti Nuclear Antibody (ANA): POSITIVE — AB

## 2024-06-29 LAB — CYCLIC CITRUL PEPTIDE ANTIBODY, IGG: Cyclic Citrullin Peptide Ab: <16 U

## 2024-06-29 LAB — URIC ACID: Uric Acid, Serum: 4.1 mg/dL (ref 2.5–7.0)

## 2024-06-29 LAB — ANTI-NUCLEAR AB-TITER (ANA TITER): ANA Titer 1: 1:40 {titer} — ABNORMAL HIGH

## 2024-06-29 LAB — ANTI-DNA ANTIBODY, DOUBLE-STRANDED: ds DNA Ab: <1 [IU]/mL

## 2024-06-29 NOTE — Progress Notes (Signed)
 ANA remains low titer positive which is not significant.  Double-stranded DNA negative, rheumatoid factor negative, anti-CCP negative, uric acid normal.

## 2024-06-29 NOTE — Progress Notes (Signed)
 dsDNA negative, RF negative, anti-CCP negative, uric acid 4.1 which is in the desirable range.  ANA pending

## 2024-07-08 ENCOUNTER — Ambulatory Visit (INDEPENDENT_AMBULATORY_CARE_PROVIDER_SITE_OTHER): Admitting: Student in an Organized Health Care Education/Training Program

## 2024-07-08 ENCOUNTER — Encounter: Payer: Self-pay | Admitting: Student in an Organized Health Care Education/Training Program

## 2024-07-08 VITALS — BP 110/75 | HR 71 | Ht 66.0 in | Wt 219.6 lb

## 2024-07-08 DIAGNOSIS — M545 Low back pain, unspecified: Secondary | ICD-10-CM | POA: Diagnosis not present

## 2024-07-08 DIAGNOSIS — G8929 Other chronic pain: Secondary | ICD-10-CM | POA: Diagnosis not present

## 2024-07-08 MED ORDER — LIDOCAINE 5 % EX PTCH
1.0000 | MEDICATED_PATCH | CUTANEOUS | 0 refills | Status: DC
Start: 2024-07-08 — End: 2024-09-07

## 2024-07-08 NOTE — Progress Notes (Signed)
 Acute Office Visit  Subjective:     Patient ID: Linda Buckley, female    DOB: 26-Sep-1956, 68 y.o.   MRN: [id number]  Chief Complaint  Patient presents with   Back Pain    Low back pain. Sx started 1 week ago. Not injury related. Sharp pain going down into her left leg    HPI  Discussed the use of AI scribe software for clinical note transcription with the patient, who gave verbal consent to proceed.  History of Present Illness Linda Buckley is a 68 year old female with fibromyalgia who presents with severe back pain radiating to the left leg.  She has been experiencing severe back pain for the past week and a half, with radiation down her left leg to the knee. The pain is described as a muscle spasm or throbbing sensation, occurring daily for the last four days, and affecting her ability to sleep and perform daily activities. She uses a heating pad, cold compresses, and Mobic  (meloxicam ) for relief, which provides some pressure relief. No recent falls or trauma, although she tripped over a gate two weeks ago without immediate injury. No fevers or chills, and she feels generally okay otherwise.  She has a history of fibromyalgia, which causes regular pain, but notes that the current back pain is different and more severe, causing weakness in her legs and limiting mobility. She has a spinal cord stimulator implant from six years ago for back pain management, which she can activate to help alleviate pain. However, she is currently locked out of the device and needs to contact the provider for assistance. She has previously used gabapentin  and buprenorphine for pain but discontinued them due to side effects.  Her current medications include Cymbalta , Mobic  (meloxicam ), tramadol , and clonazepam  for sleep. She uses Mobic  sparingly due to concerns about kidney effects, and tramadol  was prescribed for knee problems. She engages in home physical therapy using bands and a stepper, and she also  exercises in a pool.  No changes in bowel or bladder control or incontinence.  No personal history of cancer.  No fevers or chills.  No trauma to the low back.      Objective:    BP 110/75 (BP Location: Right Arm, Patient Position: Sitting, Cuff Size: Large)   Pulse 71   Ht 5' 6 (1.676 m)   Wt 219 lb 9.6 oz (99.6 kg)   SpO2 100%   BMI 35.44 kg/m    Physical Exam  Gen: Well-appearing Back: Midline surgical scar on the lumbar spine, spinal cord stimulator in place, back is tender in the paraspinal muscles on the left side. Neuro: Alert, conversational, full strength in the upper and lower extremities, slow get up and go, hunched forward gait, normal sensation, normal patellar reflexes. Psych: Appropriate mood and affect, not anxious or depressed appearing Ext: Warm, mild chronic nonpitting edema in both lower extremities      Assessment & Plan:    Problem List Items Addressed This Visit       Unprioritized   Acute on chronic low back pain - Primary   She is experiencing an acute flare-up of chronic back pain without radiculopathy, but there are no alarming neurological deficits.  No high risk features.  Imaging is not necessary, and resolution is expected in four to six weeks. For pain management, she is on a number of chronic pain medications for fibromyalgia which should be effective for her back discomfort.  She uses meloxicam  as needed,  Cymbalta , and tramadol .  I prescribed lidocaine  5% patches because she has found that helpful in the past and advised safe meloxicam  as needed, up to once daily for five days as needed. Encouraged continuation of home physical therapy exercises and use of a stepper.  I offered her referral for physical therapy but she declined.  I recommended against muscle relaxers because she is already using clonazepam .  I do not think prednisone  would be helpful.  If pain persists beyond two to four weeks, advise contacting the spinal stimulator group for  potential adjustments.       Relevant Medications   lidocaine  (LIDODERM ) 5 %    Meds ordered this encounter  Medications   lidocaine  (LIDODERM ) 5 %    Sig: Place 1 patch onto the skin daily. Remove & Discard patch within 12 hours or as directed by MD    Dispense:  30 patch    Refill:  0    Return if symptoms worsen or fail to improve.  Cleatus Debby Specking, MD

## 2024-07-08 NOTE — Patient Instructions (Signed)
  VISIT SUMMARY: Today, you were seen for severe back pain radiating to your left leg, which has been affecting your daily activities and sleep. You have a history of fibromyalgia and chronic knee osteoarthritis, and we discussed your current pain management strategies and medications.  YOUR PLAN: -ACUTE LOW BACK PAIN WITH RADICULOPATHY: This means you are experiencing severe back pain that radiates to your leg. We expect this to improve in four to six weeks. For pain relief, you are prescribed a lidocaine  5% patch and advised to take meloxicam  as needed, up to once daily for five days. Continue your home physical therapy exercises and use of a stepper. If the pain persists beyond two to four weeks, contact the spinal stimulator group for potential adjustments.  -FIBROMYALGIA: Fibromyalgia is a condition that causes widespread pain. Your chronic pain is managed with Cymbalta  and physical activities. Continue taking Cymbalta  and engaging in physical activities and swimming to help manage your symptoms.  -CHRONIC KNEE OSTEOARTHRITIS: This is a condition where the cartilage in your knee joint wears down over time, causing pain. Continue taking tramadol  as needed for knee pain management.  INSTRUCTIONS: If your back pain persists beyond two to four weeks, please contact the spinal stimulator group for potential adjustments.

## 2024-07-08 NOTE — Assessment & Plan Note (Signed)
 She is experiencing an acute flare-up of chronic back pain without radiculopathy, but there are no alarming neurological deficits.  No high risk features.  Imaging is not necessary, and resolution is expected in four to six weeks. For pain management, she is on a number of chronic pain medications for fibromyalgia which should be effective for her back discomfort.  She uses meloxicam  as needed, Cymbalta , and tramadol .  I prescribed lidocaine  5% patches because she has found that helpful in the past and advised safe meloxicam  as needed, up to once daily for five days as needed. Encouraged continuation of home physical therapy exercises and use of a stepper.  I offered her referral for physical therapy but she declined.  I recommended against muscle relaxers because she is already using clonazepam .  I do not think prednisone  would be helpful.  If pain persists beyond two to four weeks, advise contacting the spinal stimulator group for potential adjustments.

## 2024-07-10 ENCOUNTER — Other Ambulatory Visit: Payer: Self-pay | Admitting: Sports Medicine

## 2024-07-10 DIAGNOSIS — G609 Hereditary and idiopathic neuropathy, unspecified: Secondary | ICD-10-CM

## 2024-07-13 ENCOUNTER — Other Ambulatory Visit: Payer: Self-pay | Admitting: Sports Medicine

## 2024-07-13 DIAGNOSIS — M47816 Spondylosis without myelopathy or radiculopathy, lumbar region: Secondary | ICD-10-CM

## 2024-07-13 DIAGNOSIS — M1711 Unilateral primary osteoarthritis, right knee: Secondary | ICD-10-CM

## 2024-07-13 DIAGNOSIS — M797 Fibromyalgia: Secondary | ICD-10-CM

## 2024-07-13 DIAGNOSIS — M160 Bilateral primary osteoarthritis of hip: Secondary | ICD-10-CM

## 2024-07-14 ENCOUNTER — Telehealth: Payer: Self-pay

## 2024-07-14 MED ORDER — AMLODIPINE BESYLATE 5 MG PO TABS: 5.0000 mg | ORAL_TABLET | Freq: Every day | ORAL | 1 refills | Status: DC

## 2024-07-14 NOTE — Telephone Encounter (Signed)
 Requested Prescriptions   Pending Prescriptions Disp Refills   amLODipine  (NORVASC ) 5 MG tablet 30 tablet 1    Sig: Take 1 tablet (5 mg total) by mouth daily.     Date of patient request: 07/14/24 Last office visit: 06/04/24 Upcoming visit: 11/05/24 Date of last refill: 05/07/24 Last refill amount: 30 day supply

## 2024-07-18 DIAGNOSIS — I10 Essential (primary) hypertension: Secondary | ICD-10-CM | POA: Diagnosis not present

## 2024-07-18 DIAGNOSIS — Z91030 Bee allergy status: Secondary | ICD-10-CM | POA: Diagnosis not present

## 2024-07-18 DIAGNOSIS — Z87891 Personal history of nicotine dependence: Secondary | ICD-10-CM | POA: Diagnosis not present

## 2024-07-18 DIAGNOSIS — S90861A Insect bite (nonvenomous), right foot, initial encounter: Secondary | ICD-10-CM | POA: Diagnosis not present

## 2024-07-18 DIAGNOSIS — Z79899 Other long term (current) drug therapy: Secondary | ICD-10-CM | POA: Diagnosis not present

## 2024-07-18 DIAGNOSIS — T63421A Toxic effect of venom of ants, accidental (unintentional), initial encounter: Secondary | ICD-10-CM | POA: Diagnosis not present

## 2024-07-18 DIAGNOSIS — W57XXXA Bitten or stung by nonvenomous insect and other nonvenomous arthropods, initial encounter: Secondary | ICD-10-CM | POA: Diagnosis not present

## 2024-07-28 ENCOUNTER — Ambulatory Visit (INDEPENDENT_AMBULATORY_CARE_PROVIDER_SITE_OTHER): Admitting: Family Medicine

## 2024-07-28 ENCOUNTER — Encounter: Payer: Self-pay | Admitting: Family Medicine

## 2024-07-28 ENCOUNTER — Telehealth: Payer: Self-pay

## 2024-07-28 VITALS — BP 104/78 | HR 77 | Temp 98.0°F | Ht 66.0 in | Wt 212.0 lb

## 2024-07-28 DIAGNOSIS — B86 Scabies: Secondary | ICD-10-CM

## 2024-07-28 MED ORDER — PERMETHRIN 5 % EX CREA: 1.0000 | TOPICAL_CREAM | Freq: Once | CUTANEOUS | 0 refills | Status: AC

## 2024-07-28 MED ORDER — TRIAMCINOLONE ACETONIDE 0.1 % EX OINT: 1.0000 | TOPICAL_OINTMENT | Freq: Two times a day (BID) | CUTANEOUS | 1 refills | Status: AC

## 2024-07-28 NOTE — Telephone Encounter (Signed)
 Copied from CRM [id number]. Topic: Clinical - Medication Question >> Jul 28, 2024 11:36 AM Porter L wrote: Reason for CRM: [facility] called and spoke to [place]. He has questions on the dosage for  Permethrin  5 % 1 Application Topical  Once  Triamcinolone  Acetonide 0.1 % 1 Application Topical 2 times daily   On the script it says one application for one dose and its a 60 gram tube. Asking for a callback please [phone] >> Jul 28, 2024 11:40 AM Jayma L wrote: If you can't reach them , please send a new script

## 2024-07-28 NOTE — Telephone Encounter (Signed)
**Note De-identified  [place] Obfuscation** Please advise 

## 2024-07-28 NOTE — Patient Instructions (Addendum)
 Follow up as needed or as scheduled Apply the Permethrin  cream from your jaw to your toes and sleep in it overnight.  Strip your sheets in the morning, shower, dry off w/ a clean towel. Apply triamcinolone  ointment as needed for itching Call with any questions or concerns Stay Safe!  Stay Healthy! Hang in there!  Scabies, Adult  Scabies is a skin condition that happens when very small insects called mites get under your skin. This causes severe itchiness and a rash that looks like pimples. Scabies is contagious. This means it can spread easily from person to person. If you get scabies, the people you live with may get it too. With the right treatment, symptoms often go away in 2-4 weeks. In most cases, scabies does not cause lasting problems. What are the causes? Scabies is caused by tiny mites (Sarcoptes scabiei) that can only be seen with a microscope. The mites get into the top layer of your skin and lay eggs. This is called an infestation. You may get scabies if: You have close contact with someone who has scabies. You come in contact with items that have the mites on them. These may include towels, bedding, or clothes. What increases the risk? You may be more likely to get scabies if: You live in a nursing home or extended care facility. You spend time in a place where a lot of people live close together, such as a shelter or prison. You have sex with a partner who has scabies. You care for others who are at risk for scabies. What are the signs or symptoms? Symptoms of scabies include: A rash that looks like pimples. It may include tiny red bumps or blisters. It is often found in the skinfolds or on the hands, wrists, elbows, armpits, chest, waist, groin, or buttocks. Severe itchiness. This is often worse at night. Skin irritation. This can include scaly patches or sores. The bumps from scabies may form a line (burrow) on the skin. The line may look thin, crooked, and grayish-white or  skin colored. How is this diagnosed? Scabies may be diagnosed based on a physical exam of your skin. You may also have a skin test done. A sample of your skin may be taken (skin scraping) and looked at under a microscope for signs of mites. How is this treated? Scabies may be treated with: Medicated creams or lotions to kill the mites. The cream or lotion is spread on your whole body and left for a few hours. In most cases, one treatment is enough to kill all the mites. In severe cases, the treatment may need to be done more than once. Medicated cream to help with the itching. Medicines taken by mouth (orally). These may help: Relieve itching. Reduce the swelling and redness. Kill the mites. This treatment may be used in severe cases. Follow these instructions at home: Medicines Take or apply over-the-counter and prescription medicines only as told by your health care provider. Apply medicated cream or lotion as told by your provider. Do not wash off the medicated cream or lotion until enough time has passed or as told by your provider. Skin care Try not to scratch or pick at the affected areas of your skin. Keep your fingernails closely trimmed. This can help reduce injury from scratching. Take cool baths or apply cool, wet cloths to your skin. This can help reduce itching. General instructions Clean all items that you touched in the 3 days before you were diagnosed. This includes bedding,  clothes, towels, and furniture. Do this on the same day that you start treatment. Dry-clean items or use hot water to wash them. Dry them on the hot dry cycle. Place items that cannot be washed into closed, airtight plastic bags for at least 3 days. The mites cannot live for more than 3 days away from human skin. Vacuum your furniture and mattresses. Make sure that other people who may have been infested see a provider. Where to find more information Centers for Disease Control and Prevention (CDC):  [employer] Contact a health care provider if: You have itching that does not go away after 4 weeks of treatment. You keep getting new bumps or burrows. You have redness, swelling, or pain near your rash after treatment. You have fluid, blood, or pus coming from your rash. You get thick crusts or scaly patches over large areas of your skin. You have a fever. This information is not intended to replace advice given to you by your health care provider. Make sure you discuss any questions you have with your health care provider. Document Revised: 09/09/2022 Document Reviewed: 09/09/2022 Elsevier Patient Education  2024 [employer].

## 2024-07-28 NOTE — Progress Notes (Signed)
   Subjective:    Patient ID: Linda Buckley, female    DOB: [birth year], 68 y.o.   MRN: [id number]  HPI ER f/u- went to ER on 8/3 for possible ant bites.  She developed R foot swelling and redness after walking through wet grass.  Had multiple pustules.  Was sent home w/ Keflex, Medrol  Dosepack, Benadryl , and Pepcid.  Continues to have itching on her feet.  Itching is now extending up both legs.  Using OTC anti-itch gel.  Has itching on both feet.   Review of Systems For ROS see HPI     Objective:   Physical Exam Vitals reviewed.  Constitutional:      General: She is not in acute distress.    Appearance: Normal appearance. She is obese.  Skin:    General: Skin is warm and dry.     Findings: Rash (scabbed lesions on feet bilaterally, between toes, extending up lower legs) present.  Neurological:     Mental Status: She is alert and oriented to person, place, and time. Mental status is at baseline.  Psychiatric:        Mood and Affect: Mood normal.        Behavior: Behavior normal.        Thought Content: Thought content normal.           Assessment & Plan:  Scabies- new.  Pt has lesions on both feet, between toes, extending up both legs.  Very itchy.  Not consistent w/ ant bites but appears to be scabies.  Start Elimite  x1 application and Triamcinolone  ointment prn.  Pt expressed understanding and is in agreement w/ plan.

## 2024-07-28 NOTE — Telephone Encounter (Signed)
 There seems to be some confusion-  Triamcinolone  ointment was sent for 90g- apply twice daily as needed for itching  Permethrin  cream was sent 60g- apply once tonight (which is the standard 1 time application for scabies)

## 2024-07-29 NOTE — Telephone Encounter (Signed)
 This matter has been resolved with the pharmacy

## 2024-07-29 NOTE — Telephone Encounter (Signed)
 Called pharmacy they stated they just needed to know where she is to apply these creams so I looked in visit notes and stated both feet, in between toes and legs.

## 2024-07-29 NOTE — Telephone Encounter (Signed)
 Permethrin  cream is applied from neck to toes- covering all external surface area  The Triamcinolone  ointment is to be used on the itchy areas- feet and legs

## 2024-08-17 ENCOUNTER — Encounter: Payer: Self-pay | Admitting: Sports Medicine

## 2024-09-07 ENCOUNTER — Other Ambulatory Visit: Payer: Self-pay | Admitting: Student in an Organized Health Care Education/Training Program

## 2024-09-07 DIAGNOSIS — G8929 Other chronic pain: Secondary | ICD-10-CM

## 2024-09-07 NOTE — Telephone Encounter (Signed)
 Requested Prescriptions   Pending Prescriptions Disp Refills   lidocaine  (LIDODERM ) 5 % [Pharmacy Med Name: LIDOCAINE  5% PATCH] 30 patch 0    Sig: APPLY 1 PATCH TO AFFECTED AREA FOR 12 HOURS IN A 24 HOUR PERIOD     Date of patient request: 09/07/24 Last office visit: 07/08/2024 Upcoming visit: Visit date not found Date of last refill: 07/08/24 Last refill amount: 30 patches

## 2024-09-18 ENCOUNTER — Other Ambulatory Visit: Payer: Self-pay | Admitting: Family Medicine

## 2024-09-21 ENCOUNTER — Other Ambulatory Visit: Payer: Self-pay

## 2024-09-21 DIAGNOSIS — M47816 Spondylosis without myelopathy or radiculopathy, lumbar region: Secondary | ICD-10-CM

## 2024-09-21 DIAGNOSIS — M1711 Unilateral primary osteoarthritis, right knee: Secondary | ICD-10-CM

## 2024-09-21 DIAGNOSIS — M797 Fibromyalgia: Secondary | ICD-10-CM

## 2024-09-21 DIAGNOSIS — M160 Bilateral primary osteoarthritis of hip: Secondary | ICD-10-CM

## 2024-09-21 NOTE — Telephone Encounter (Signed)
 Last refill: 07/13/24 #[id number]

## 2024-10-01 ENCOUNTER — Other Ambulatory Visit: Payer: Self-pay

## 2024-10-01 ENCOUNTER — Encounter: Payer: Self-pay | Admitting: Oncology

## 2024-10-01 ENCOUNTER — Inpatient Hospital Stay: Attending: Oncology

## 2024-10-01 ENCOUNTER — Inpatient Hospital Stay: Admitting: Oncology

## 2024-10-01 VITALS — BP 127/92 | HR 71 | Temp 98.3°F | Resp 17 | Ht 66.0 in | Wt 212.5 lb

## 2024-10-01 DIAGNOSIS — Z79899 Other long term (current) drug therapy: Secondary | ICD-10-CM | POA: Insufficient documentation

## 2024-10-01 DIAGNOSIS — M797 Fibromyalgia: Secondary | ICD-10-CM | POA: Diagnosis not present

## 2024-10-01 DIAGNOSIS — Z86711 Personal history of pulmonary embolism: Secondary | ICD-10-CM | POA: Insufficient documentation

## 2024-10-01 DIAGNOSIS — M25471 Effusion, right ankle: Secondary | ICD-10-CM | POA: Diagnosis not present

## 2024-10-01 DIAGNOSIS — R197 Diarrhea, unspecified: Secondary | ICD-10-CM | POA: Diagnosis not present

## 2024-10-01 DIAGNOSIS — I10 Essential (primary) hypertension: Secondary | ICD-10-CM | POA: Insufficient documentation

## 2024-10-01 DIAGNOSIS — D72819 Decreased white blood cell count, unspecified: Secondary | ICD-10-CM | POA: Insufficient documentation

## 2024-10-01 DIAGNOSIS — Z9884 Bariatric surgery status: Secondary | ICD-10-CM | POA: Diagnosis not present

## 2024-10-01 DIAGNOSIS — I2699 Other pulmonary embolism without acute cor pulmonale: Secondary | ICD-10-CM

## 2024-10-01 DIAGNOSIS — Z7901 Long term (current) use of anticoagulants: Secondary | ICD-10-CM | POA: Insufficient documentation

## 2024-10-01 DIAGNOSIS — M25472 Effusion, left ankle: Secondary | ICD-10-CM | POA: Diagnosis not present

## 2024-10-01 DIAGNOSIS — G4730 Sleep apnea, unspecified: Secondary | ICD-10-CM | POA: Insufficient documentation

## 2024-10-01 DIAGNOSIS — Z87892 Personal history of anaphylaxis: Secondary | ICD-10-CM | POA: Insufficient documentation

## 2024-10-01 DIAGNOSIS — R0602 Shortness of breath: Secondary | ICD-10-CM | POA: Insufficient documentation

## 2024-10-01 LAB — CBC WITH DIFFERENTIAL (CANCER CENTER ONLY)
Abs Immature Granulocytes: 0 K/uL (ref 0.00–0.07)
Basophils Absolute: 0 K/uL (ref 0.0–0.1)
Basophils Relative: 1 %
Eosinophils Absolute: 0.1 K/uL (ref 0.0–0.5)
Eosinophils Relative: 2 %
HCT: 42.1 % (ref 36.0–46.0)
Hemoglobin: 13.4 g/dL (ref 12.0–15.0)
Immature Granulocytes: 0 %
Lymphocytes Relative: 26 %
Lymphs Abs: 1 K/uL (ref 0.7–4.0)
MCH: 29.5 pg (ref 26.0–34.0)
MCHC: 31.8 g/dL (ref 30.0–36.0)
MCV: 92.5 fL (ref 80.0–100.0)
Monocytes Absolute: 0.3 K/uL (ref 0.1–1.0)
Monocytes Relative: 7 %
Neutro Abs: 2.4 K/uL (ref 1.7–7.7)
Neutrophils Relative %: 64 %
Platelet Count: 249 K/uL (ref 150–400)
RBC: 4.55 MIL/uL (ref 3.87–5.11)
RDW: 11.9 % (ref 11.5–15.5)
WBC Count: 3.8 K/uL — ABNORMAL LOW (ref 4.0–10.5)
nRBC: 0 % (ref 0.0–0.2)

## 2024-10-01 LAB — D-DIMER, QUANTITATIVE: D-Dimer, Quant: 0.45 ug{FEU}/mL (ref 0.00–0.50)

## 2024-10-01 MED ORDER — CLONAZEPAM 0.5 MG PO TABS: ORAL_TABLET | ORAL | 3 refills | Status: DC

## 2024-10-01 NOTE — Assessment & Plan Note (Signed)
 White blood cell count slightly below normal but improved from last visit. Current count is 3,800, close to normal range. - Likely benign variant.  No intervention needed. - Continue monitoring blood counts.

## 2024-10-01 NOTE — Telephone Encounter (Signed)
 Requested Prescriptions   Pending Prescriptions Disp Refills   clonazePAM  (KLONOPIN ) 0.5 MG tablet 30 tablet 3    Sig: TAKE ONE TABLET BY MOUTH EVERY NIGHT AT BEDTIME     Date of patient request: 10/01/2024 Last office visit: 07/28/2024 Upcoming visit: 11/05/2024 Date of last refill: 03/03/2024 Last refill amount: 30x3

## 2024-10-01 NOTE — Assessment & Plan Note (Addendum)
 First episode of PE on 11/17/2023 with no clear provoking factors. Shortness of breath and bilateral ankle swelling reported. Family history of blood clots.  On her initial consultation with us  on 12/18/2023, we obtained hypercoagulable workup. Prothrombin gene mutation, factor V Leiden mutation, beta-2  glycoprotein antibodies, anticardiolipin antibodies, lupus anticoagulant were all negative.  D-dimer was undetectable.  Rest of the hypercoagulable workup was deferred since it can be falsely abnormal given recent embolism.    Patient has had trouble filling her Xarelto  prescription as it was not approved by her insurance and she had to pay out-of-pocket.  We switched her to Eliquis  5 mg p.o. twice daily and this was covered by her insurance.  D-dimer was previously undetectable.  CBCD unremarkable except for chronic mild leukopenia with white count of 3500, normal differential.  On 05/28/2024, protein C activity, protein S activity, Antithrombin III  activity were all within normal limits.  Repeat CT chest angiogram on 06/04/2024 showed no evidence of pulmonary embolism.  Patient preferred to come off of anticoagulation.  She cannot take aspirin because of anaphylactic reaction previously.  Hence close surveillance with monitoring for any concerning symptoms was recommended.  She completed at least 6 months of anticoagulation and is now on surveillance.  D-dimer is better at 0.45 today.  No clinical concern for recurrence of pulmonary embolism.  She is up-to-date on age-appropriate cancer screening.  RTC in 6 months for routine follow-up.  Will continue to space out clinic intervals slowly.

## 2024-10-01 NOTE — Progress Notes (Signed)
 [facility]  HEMATOLOGY CLINIC PROGRESS NOTE  PATIENT NAME: Linda Buckley   MR#: [id number] DOB: 06/11/1956  Patient Care Team: Mahlon Comer BRAVO, MD as PCP - General (Family Medicine) Shari Easter, MD as Consulting Physician (Orthopedic Surgery) Bonner Ade, MD as Consulting Physician (Physical Medicine and Rehabilitation) Burnetta Aures, MD as Consulting Physician (Orthopedic Surgery) Teressa Toribio SQUIBB, MD (Inactive) as Attending Physician (Gastroenterology) Jenel Carlin POUR, MD (Inactive) as Consulting Physician (Neurology) Johnson, Laymon HERO, PA-C as Physician Assistant (Physician Assistant) Sherryl Bouchard, NP as Registered Nurse (Gerontology) Gable Charleston, PA-C as Physician Assistant (Orthopedic Surgery) Pandora Cadet, [facility] as Pharmacist (Pharmacist)  Date of visit: 10/01/2024   ASSESSMENT & PLAN:   Linda Buckley is a 68 y.o. lady with a past medical history of hypertension, sleep apnea, fibromyalgia, depression, gastric sleeve surgery, was referred to our service in January 2025 for evaluation of possible hypercoagulable state after she was diagnosed with pulmonary embolism on 11/17/2023. Grossly negative hypercoagulable workup.   PE (pulmonary thromboembolism) (HCC) First episode of PE on 11/17/2023 with no clear provoking factors. Shortness of breath and bilateral ankle swelling reported. Family history of blood clots.  On her initial consultation with us  on 12/18/2023, we obtained hypercoagulable workup. Prothrombin gene mutation, factor V Leiden mutation, beta-2  glycoprotein antibodies, anticardiolipin antibodies, lupus anticoagulant were all negative.  D-dimer was undetectable.  Rest of the hypercoagulable workup was deferred since it can be falsely abnormal given recent embolism.    Patient has had trouble filling her Xarelto  prescription as it was not approved by her insurance and she had to pay out-of-pocket.  We switched her to Eliquis  5 mg p.o.  twice daily and this was covered by her insurance.  D-dimer was previously undetectable.  CBCD unremarkable except for chronic mild leukopenia with white count of 3500, normal differential.  On 05/28/2024, protein C activity, protein S activity, Antithrombin III  activity were all within normal limits.  Repeat CT chest angiogram on 06/04/2024 showed no evidence of pulmonary embolism.  Patient preferred to come off of anticoagulation.  She cannot take aspirin because of anaphylactic reaction previously.  Hence close surveillance with monitoring for any concerning symptoms was recommended.  She completed at least 6 months of anticoagulation and is now on surveillance.  D-dimer is better at 0.45 today.  No clinical concern for recurrence of pulmonary embolism.  She is up-to-date on age-appropriate cancer screening.  RTC in 6 months for routine follow-up.  Will continue to space out clinic intervals slowly.  Chronic leukopenia White blood cell count slightly below normal but improved from last visit. Current count is 3,800, close to normal range. - Likely benign variant.  No intervention needed. - Continue monitoring blood counts.  Chronic shortness of breath Consistent and unchanged over time. No recent exacerbation reported. Previous workup including CT scan and blood work did not reveal any concerning findings. - Advise to report any significant changes in breathing.  I spent a total of 27 minutes during this encounter with the patient including review of chart and various tests results, discussions about plan of care and coordination of care plan.  I reviewed lab results and outside records for this visit and discussed relevant results with the patient. Diagnosis, plan of care and treatment options were also discussed in detail with the patient. Opportunity provided to ask questions and answers provided to her apparent satisfaction. Provided instructions to call our clinic with any  problems, questions or concerns prior to return visit. I recommended  to continue follow-up with PCP and sub-specialists. She verbalized understanding and agreed with the plan. No barriers to learning was detected.  Chinita Patten, MD  10/01/2024 2:31 PM  [facility] - A DEPT OF [facility] [address] [place] [address] Dept: [phone] Dept Fax: [phone]   CHIEF COMPLAINT/ REASON FOR VISIT:  Follow-up for history of unprovoked pulmonary embolism in December 2024.  Grossly negative hypercoagulable workup.  INTERVAL HISTORY:  Discussed the use of AI scribe software for clinical note transcription with the patient, who gave verbal consent to proceed.  History of Present Illness Linda Buckley is a 68 year old female with fibromyalgia who presents with chronic shortness of breath.  She experiences persistent shortness of breath, particularly during physical activities, without recent changes. A CT scan of the chest was performed in June. Previous blood work was performed, and a D-dimer test was slightly elevated. No leg swelling or pain is present.  She has a history of fibromyalgia, which she mentions in relation to her ongoing symptoms. She is not currently taking blood thinners due to cost issues and an inability to take aspirin. Her blood counts are monitored regularly, with her white blood cell count measured at 2,500 previously and 3,800 most recently; the normal range is 4,000. She is not experiencing any new or worsening symptoms such as chest pain or significant changes in her breathing.    SUMMARY OF HEMATOLOGIC HISTORY:  The patient was admitted to the hospital on 11/17/2023 after experiencing shortness of breath and a lingering cold.  CT angiogram showed pulmonary embolism in the right lung with most proximal thrombus at lobar pulmonary artery level.  No evidence of right heart strain.  She was  initially on heparin  drip and was later started on Xarelto .   The patient denies any recent surgeries, prolonged bed rest, or long trips. There is a family history of a blood clot in the patient's father. The patient reports bilateral ankle swelling.   In addition to the pulmonary embolism, the patient reports unintentional weight loss of 24 pounds over the past 2-3 months without any changes in diet or exercise.  It is presumed to be from her gastric sleeve procedure.  The patient also reports changes in bowel habits, with increased frequency and loose stools over the past two weeks. The patient denies any chest pain or acid reflux symptoms.   First episode of PE on 11/17/2023 with no clear provoking factors. Shortness of breath and bilateral ankle swelling reported. Family history of blood clots.  On her initial consultation with us  on 12/18/2023, we obtained hypercoagulable workup. Prothrombin gene mutation, factor V Leiden mutation, beta-2  glycoprotein antibodies, anticardiolipin antibodies, lupus anticoagulant were all negative.  D-dimer was undetectable.  Rest of the hypercoagulable workup was deferred since it can be falsely abnormal given recent embolism.    Patient has had trouble filling her Xarelto  prescription as it was not approved by her insurance and she had to pay out-of-pocket.  We switched her to Eliquis  5 mg p.o. twice daily and this was covered by her insurance.  At the minimum 6 months of anticoagulation is recommended.  She is up-to-date on age-appropriate cancer screening.  On 05/28/2024, protein C activity, protein S activity, Antithrombin III  activity were all within normal limits.   Repeat CT chest angiogram on 06/04/2024 showed no evidence of pulmonary embolism.  Patient preferred to come off of anticoagulation.  She cannot  take aspirin because of anaphylactic reaction previously.  Hence close surveillance with monitoring for any concerning symptoms was recommended.   I have  reviewed the past medical history, past surgical history, social history and family history with the patient and they are unchanged from previous note.  ALLERGIES: She is allergic to aspirin and bee venom.  MEDICATIONS:  Current Outpatient Medications  Medication Sig Dispense Refill   albuterol  (VENTOLIN  HFA) 108 (90 Base) MCG/ACT inhaler Inhale 2 puffs into the lungs every 6 (six) hours as needed for wheezing or shortness of breath. 8 g 1   amitriptyline  (ELAVIL ) 50 MG tablet Take 1 tablet (50 mg total) by mouth at bedtime. 90 tablet 3   amLODipine  (NORVASC ) 5 MG tablet Take 1 tablet (5 mg total) by mouth daily. 30 tablet 1   busPIRone  (BUSPAR ) 30 MG tablet Take 1 tablet (30 mg total) by mouth in the morning and at bedtime. 60 tablet 3   cetirizine  (ZYRTEC ) 10 MG tablet TAKE 1 TABLET(10 MG) BY MOUTH DAILY 30 tablet 0   Cyanocobalamin  (VITAMIN B-12 PO) Take by mouth. (Patient taking differently: Take by mouth. Takes as needed)     DULoxetine  (CYMBALTA ) 60 MG capsule TAKE 1 CAPSULE DAILY FOR 2 WEEKS AND THEN INCREASE TO 1 CAPSULE TWICE DAILY 180 capsule 3   EPINEPHrine  0.3 mg/0.3 mL IJ SOAJ injection Inject 0.3 mg into the muscle as needed for anaphylaxis. 1 each 1   lidocaine  (LIDODERM ) 5 % APPLY 1 PATCH TO AFFECTED AREA FOR 12 HOURS IN A 24 HOUR PERIOD 30 patch 0   meloxicam  (MOBIC ) 15 MG tablet Take 1 tablet (15 mg total) by mouth daily. 30 tablet 11   Multiple Vitamin (MULTIVITAMIN) capsule Take 1 capsule by mouth daily. With iron     SUMAtriptan  (IMITREX ) 50 MG tablet Take 1 tablet (50 mg total) by mouth every 2 (two) hours as needed for migraine. May repeat in 2 hours if headache persists or recurs. 10 tablet 6   traMADol  (ULTRAM ) 50 MG tablet Take 1-2 tablets (50-100 mg total) by mouth every 12 (twelve) hours as needed. 180 tablet 0   triamcinolone  ointment (KENALOG ) 0.1 % Apply 1 Application topically 2 (two) times daily. 90 g 1   VITAMIN E  PO Take by mouth.     clonazePAM  (KLONOPIN ) 0.5  MG tablet TAKE ONE TABLET BY MOUTH EVERY NIGHT AT BEDTIME 30 tablet 3   omeprazole  (PRILOSEC) 20 MG capsule Take 1 capsule (20 mg total) by mouth daily. (Patient not taking: Reported on 10/01/2024) 30 capsule 3   No current facility-administered medications for this visit.     REVIEW OF SYSTEMS:    ROS  All other pertinent systems were reviewed with the patient and are negative.  PHYSICAL EXAMINATION:   Onc Performance Status - 10/01/24 1104       ECOG Perf Status   ECOG Perf Status Ambulatory and capable of all selfcare but unable to carry out any work activities.  Up and about more than 50% of waking hours      KPS SCALE   KPS % SCORE Cares for self, unable to carry on normal activity or to do active work          Vitals:   10/01/24 1103  BP: (!) 127/92  Pulse: 71  Resp: 17  Temp: 98.3 F (36.8 C)  SpO2: 96%    Filed Weights   10/01/24 1103  Weight: 212 lb 8 oz (96.4 kg)    Physical Exam  Constitutional:      General: She is not in acute distress.    Appearance: Normal appearance.  HENT:     Head: Normocephalic and atraumatic.  Eyes:     General: No scleral icterus.    Conjunctiva/sclera: Conjunctivae normal.  Cardiovascular:     Rate and Rhythm: Normal rate and regular rhythm.     Heart sounds: Normal heart sounds.  Pulmonary:     Effort: Pulmonary effort is normal.     Breath sounds: Normal breath sounds.  Abdominal:     General: There is no distension.  Musculoskeletal:     Right lower leg: No edema.     Left lower leg: No edema.  Neurological:     General: No focal deficit present.     Mental Status: She is alert and oriented to person, place, and time.  Psychiatric:        Mood and Affect: Mood normal.        Behavior: Behavior normal.        Thought Content: Thought content normal.     LABORATORY DATA:   I have reviewed the data as listed.  Results for orders placed or performed in visit on 10/01/24  D-dimer, quantitative  Result  Value Ref Range   D-Dimer, Quant 0.45 0.00 - 0.50 ug/mL-FEU  CBC with Differential (Cancer Center Only)  Result Value Ref Range   WBC Count 3.8 (L) 4.0 - 10.5 K/uL   RBC 4.55 3.87 - 5.11 MIL/uL   Hemoglobin 13.4 12.0 - 15.0 g/dL   HCT 57.8 63.9 - 53.9 %   MCV 92.5 80.0 - 100.0 fL   MCH 29.5 26.0 - 34.0 pg   MCHC 31.8 30.0 - 36.0 g/dL   RDW 88.0 88.4 - 84.4 %   Platelet Count 249 150 - 400 K/uL   nRBC 0.0 0.0 - 0.2 %   Neutrophils Relative % 64 %   Neutro Abs 2.4 1.7 - 7.7 K/uL   Lymphocytes Relative 26 %   Lymphs Abs 1.0 0.7 - 4.0 K/uL   Monocytes Relative 7 %   Monocytes Absolute 0.3 0.1 - 1.0 K/uL   Eosinophils Relative 2 %   Eosinophils Absolute 0.1 0.0 - 0.5 K/uL   Basophils Relative 1 %   Basophils Absolute 0.0 0.0 - 0.1 K/uL   Immature Granulocytes 0 %   Abs Immature Granulocytes 0.00 0.00 - 0.07 K/uL     RADIOGRAPHIC STUDIES:   MM 3D DIAGNOSTIC MAMMOGRAM UNILATERAL LEFT BREAST CLINICAL DATA:  Recall from screening to evaluate a possible left breast mass.  EXAM: DIGITAL DIAGNOSTIC UNILATERAL LEFT MAMMOGRAM WITH TOMOSYNTHESIS AND CAD  TECHNIQUE: Left digital diagnostic mammography and breast tomosynthesis was performed. The images were evaluated with computer-aided detection.  COMPARISON:  Previous exam(s).  ACR Breast Density Category b: There are scattered areas of fibroglandular density.  FINDINGS: Spot compression tomographic images of the left breast demonstrate no concerning retroareolar mass. There is a persistent small asymmetry in the superior left retroareolar region unchanged from 2018 and therefore considered benign.  IMPRESSION: No concerning abnormality in the left retroareolar region.  RECOMMENDATION: Recommend continued annual bilateral screening mammographic follow-up.  I have discussed the findings and recommendations with the patient. If applicable, a reminder letter will be sent to the patient regarding the next  appointment.  BI-RADS CATEGORY  2: Benign.  Electronically Signed   By: Toribio Agreste M.D.   On: 06/08/2024 10:46    Orders Placed This Encounter  Procedures  CBC with Differential (Cancer Center Only)    Standing Status:   Future    Expected Date:   04/01/2025    Expiration Date:   06/30/2025   D-dimer, quantitative    Standing Status:   Future    Expected Date:   04/01/2025    Expiration Date:   06/30/2025     Future Appointments  Date Time Provider Department Center  11/05/2024  1:00 PM Mahlon Comer BRAVO, MD [facility] [place]  04/01/2025  9:00 AM [facility] None  04/01/2025  9:30 AM Dsean Vantol, Chinita, MD [facility] None  06/28/2025  8:10 AM [facility] ANNUAL WELLNESS VISIT [facility] [place]  06/28/2025 11:00 AM Dolphus Reiter, MD [facility] None     This document was completed utilizing speech recognition software. Grammatical errors, random word insertions, pronoun errors, and incomplete sentences are an occasional consequence of this system due to software limitations, ambient noise, and hardware issues. Any formal questions or concerns about the content, text or information contained within the body of this dictation should be directly addressed to the provider for clarification.

## 2024-10-05 ENCOUNTER — Other Ambulatory Visit: Payer: Self-pay

## 2024-10-05 MED ORDER — AMLODIPINE BESYLATE 5 MG PO TABS: 5.0000 mg | ORAL_TABLET | Freq: Every day | ORAL | 1 refills | Status: DC

## 2024-10-13 DIAGNOSIS — M1990 Unspecified osteoarthritis, unspecified site: Secondary | ICD-10-CM | POA: Diagnosis not present

## 2024-10-13 DIAGNOSIS — K219 Gastro-esophageal reflux disease without esophagitis: Secondary | ICD-10-CM | POA: Diagnosis not present

## 2024-10-13 DIAGNOSIS — F411 Generalized anxiety disorder: Secondary | ICD-10-CM | POA: Diagnosis not present

## 2024-10-13 DIAGNOSIS — E669 Obesity, unspecified: Secondary | ICD-10-CM | POA: Diagnosis not present

## 2024-10-13 DIAGNOSIS — I10 Essential (primary) hypertension: Secondary | ICD-10-CM | POA: Diagnosis not present

## 2024-10-13 DIAGNOSIS — F329 Major depressive disorder, single episode, unspecified: Secondary | ICD-10-CM | POA: Diagnosis not present

## 2024-10-13 DIAGNOSIS — Z8249 Family history of ischemic heart disease and other diseases of the circulatory system: Secondary | ICD-10-CM | POA: Diagnosis not present

## 2024-10-13 DIAGNOSIS — I209 Angina pectoris, unspecified: Secondary | ICD-10-CM | POA: Diagnosis not present

## 2024-10-13 DIAGNOSIS — G4733 Obstructive sleep apnea (adult) (pediatric): Secondary | ICD-10-CM | POA: Diagnosis not present

## 2024-10-18 ENCOUNTER — Ambulatory Visit: Payer: Self-pay

## 2024-10-18 NOTE — Telephone Encounter (Signed)
 FYI Only or Action Required?: FYI only for provider: ED advised.  Patient was last seen in primary care on 07/28/2024 by Mahlon Comer BRAVO, MD.  Called Nurse Triage reporting  Chest pain.  Symptoms began several weeks ago.  Interventions attempted: Nothing.  Symptoms are: gradually worsening.  Triage Disposition: Go to ED Now (Notify PCP)  Patient/caregiver understands and will follow disposition?: Yes                  Copied from CRM [id number]. Topic: Clinical - Red Word Triage >> Oct 18, 2024  4:23 PM Alfonso HERO wrote: Red Word that prompted transfer to Nurse Triage: patient has been having pain in the top left of her breast Reason for Disposition  SEVERE chest pain  Answer Assessment - Initial Assessment Questions 1. LOCATION: Where does it hurt?       Left side above breast 2. RADIATION: Does the pain go anywhere else? (e.g., into neck, jaw, arms, back)     Yes up to shoulder and neck 3. ONSET: When did the chest pain begin? (Minutes, hours or days)      1.5 weeks 4. PATTERN: Does the pain come and go, or has it been constant since it started?  Does it get worse with exertion?      Comes and goes 5. DURATION: How long does it last (e.g., seconds, minutes, hours)     10-15 minutes 6. SEVERITY: How bad is the pain?  (e.g., Scale 1-10; mild, moderate, or severe)     Has to sit down 7. CARDIAC RISK FACTORS: Do you have any history of heart problems or risk factors for heart disease? (e.g., angina, prior heart attack; diabetes, high blood pressure, high cholesterol, smoker, or strong family history of heart disease)     Heart murmur 8. PULMONARY RISK FACTORS: Do you have any history of lung disease?  (e.g., blood clots in lung, asthma, emphysema, birth control pills)     no 9. CAUSE: What do you think is causing the chest pain?     unsure 10. OTHER SYMPTOMS: Do you have any other symptoms? (e.g., dizziness, nausea, vomiting, sweating, fever,  difficulty breathing, cough)       Last night legs were cramping for about 40 minutes.  Protocols used: Chest Pain-A-AH

## 2024-10-19 NOTE — Telephone Encounter (Signed)
 Looking at her chart she has a history of pulmonary embolus and was taken off of anticoagulation after CT chest in June showing no evidence of PE.  Close surveillance with monitoring for any concerning symptoms was recommended by oncology on October 17.  I am concerned with her symptoms of chest pain, and leg cramps.  I do agree with ER evaluation but I do not see that she has been seen.  Please call her first thing Wednesday morning and recommend ER eval. At the minimum should be seen by a provider tomorrow for evaluation and possible testing.

## 2024-10-20 ENCOUNTER — Telehealth: Payer: Self-pay | Admitting: Family Medicine

## 2024-10-20 NOTE — Telephone Encounter (Signed)
 Called patient and left vm to return call. If patient calls back, did she go to the ED and how is she feeling

## 2024-10-20 NOTE — Telephone Encounter (Signed)
 Copied from CRM #[id number]. Topic: General - Call Back - No Documentation >> Oct 20, 2024  9:54 AM Eva FALCON wrote:  Reason for CRM: Pt was returning call to [facility] and states she did not go to ED started feeling better, has not had anymore pain since Monday. Took some tums, unsure if that is what helped.

## 2024-10-20 NOTE — Telephone Encounter (Signed)
 Please advise on referral?    Orthopedic, states Dr. Curtis is no longer with the office she was seeing him at, states all his patient were sent to [facility] and she does not want to go to [facility] and does not care for them

## 2024-10-20 NOTE — Telephone Encounter (Signed)
 Noted. Thanks.

## 2024-10-20 NOTE — Telephone Encounter (Signed)
 Noted.  If any return of symptoms should be seen right away.

## 2024-10-20 NOTE — Telephone Encounter (Signed)
 Copied from CRM #[id number]. Topic: Referral - Request for Referral >> Oct 20, 2024  9:57 AM Eva FALCON wrote:  Did the patient discuss referral with their provider in the last year? Yes (If No - schedule appointment) (If Yes - send message)  Appointment offered? No  Type of order/referral and detailed reason for visit: Orthopedic, states Dr. Curtis is no longer with the office she was seeing him at, states all his patient were sent to [facility] and she does not want to go to [facility] and does not care for them.   Preference of office, provider, location: No preference, just something that is close to her.   If referral order, have you been seen by this specialty before? Yes (If Yes, this issue or another issue? When? Where?  Can we respond through MyChart? Yes

## 2024-10-21 ENCOUNTER — Other Ambulatory Visit: Payer: Self-pay | Admitting: Family

## 2024-10-21 DIAGNOSIS — M1711 Unilateral primary osteoarthritis, right knee: Secondary | ICD-10-CM

## 2024-10-21 DIAGNOSIS — M797 Fibromyalgia: Secondary | ICD-10-CM

## 2024-10-21 DIAGNOSIS — M47816 Spondylosis without myelopathy or radiculopathy, lumbar region: Secondary | ICD-10-CM

## 2024-10-21 DIAGNOSIS — M160 Bilateral primary osteoarthritis of hip: Secondary | ICD-10-CM

## 2024-10-21 MED ORDER — TRAMADOL HCL 50 MG PO TABS
50.0000 mg | ORAL_TABLET | Freq: Two times a day (BID) | ORAL | 0 refills | Status: AC | PRN
Start: 2024-10-21 — End: ?

## 2024-10-21 NOTE — Telephone Encounter (Signed)
 Will defer to PCP, but does have other sports medicine options including [facility] or [facility] in [place].

## 2024-10-21 NOTE — Telephone Encounter (Signed)
 Called patient and she would like to go to [facility]. If pcp agree's. Patient will wait until Dr. Mahlon is back. Pain stated she cannot get her tramadol . She needs a refill on tramadol  50mg  4 times a day. She wants it sent to [facility] in [facility]. Okay to refill?

## 2024-10-26 DIAGNOSIS — M25512 Pain in left shoulder: Secondary | ICD-10-CM | POA: Diagnosis not present

## 2024-10-26 DIAGNOSIS — M25511 Pain in right shoulder: Secondary | ICD-10-CM | POA: Diagnosis not present

## 2024-10-26 DIAGNOSIS — G8929 Other chronic pain: Secondary | ICD-10-CM | POA: Diagnosis not present

## 2024-10-26 DIAGNOSIS — Z87891 Personal history of nicotine dependence: Secondary | ICD-10-CM | POA: Diagnosis not present

## 2024-10-26 DIAGNOSIS — I10 Essential (primary) hypertension: Secondary | ICD-10-CM | POA: Diagnosis not present

## 2024-10-26 DIAGNOSIS — Z7951 Long term (current) use of inhaled steroids: Secondary | ICD-10-CM | POA: Diagnosis not present

## 2024-10-26 DIAGNOSIS — S46911A Strain of unspecified muscle, fascia and tendon at shoulder and upper arm level, right arm, initial encounter: Secondary | ICD-10-CM | POA: Diagnosis not present

## 2024-10-28 ENCOUNTER — Ambulatory Visit: Payer: Self-pay | Admitting: *Deleted

## 2024-10-28 NOTE — Telephone Encounter (Signed)
 Recommended ED due to severe pain and medication not effective. No hospital f/u appt until 11/05/24. Please advise, patient does not want to go back to ED.  CAL notified.      FYI Only or Action Required?: FYI only for provider: ED advised.  Patient was last seen in primary care on 07/28/2024 by Mahlon Comer BRAVO, MD.  Called Nurse Triage reporting Shoulder Pain.  Symptoms began today.  Interventions attempted: Prescription medications: robaxin not effective .  Symptoms are: rapidly worsening.  Triage Disposition: Go to ED Now (or PCP Triage)  Patient/caregiver understands and will follow disposition?: No, wishes to speak with PCP              Copied from CRM #[id number]. Topic: Clinical - Red Word Triage >> Oct 28, 2024 11:02 AM Franky GRADE wrote: Red Word that prompted transfer to Nurse Triage: Patient is experiencing severe right shoulder pain, Patient was seen at the ED on 10/26/2024 and was prescribed a muscle relaxer; however, that is not helping the pain is to unbearable. Reason for Disposition  [1] SEVERE pain AND [2] not improved 2 hours after pain medicine  Answer Assessment - Initial Assessment Questions Patient in severe pain right shoulder. Has been taking Robaxin as prescribed from ED visit 10/26/24. No relief. Recommended to go to ED due to severe pain. Patient requesting to f/u with PCP and was told to see PCP within 3 days of ED visit. No available hospital f/u appt until 11/05/24. Patient does not want to go back to ED . Requesting call back.   CAL notified.    1. ONSET: When did the pain start?     1 week 1/2 last seen in ED 10/26/24 2. LOCATION: Where is the pain located?     Right shoulder 3. PAIN: How bad is the pain? (Scale 1-10; or mild, moderate, severe)     severe 4. WORK OR EXERCISE: Has there been any recent work or exercise that involved this part of the body?     na 5. CAUSE: What do you think is causing the shoulder pain?      Not sure  6. OTHER SYMPTOMS: Do you have any other symptoms? (e.g., neck pain, swelling, rash, fever, numbness, weakness)     N/T fingers right arm, elbow , shoulder inside and out. Has to lift arm to move. Pain from arm pit to outer shoulder .  7. PREGNANCY: Is there any chance you are pregnant? When was your last menstrual period?     na  Protocols used: Shoulder Pain-A-AH

## 2024-10-29 DIAGNOSIS — M25511 Pain in right shoulder: Secondary | ICD-10-CM | POA: Diagnosis not present

## 2024-10-29 DIAGNOSIS — M542 Cervicalgia: Secondary | ICD-10-CM | POA: Diagnosis not present

## 2024-10-29 NOTE — Telephone Encounter (Signed)
 Per the ER note from 11/11, they referred her to Ortho for her shoulder pain.  This is who she needs to f/u with.  If she cannot wait for her Ortho appt, most orthopedic offices now have Urgent Care or Walk-In services.

## 2024-10-29 NOTE — Telephone Encounter (Signed)
 Called patient to let her know of ortho locations that offer walk in services, patient verbalized understanding and will be headed to [facility] [place].

## 2024-11-01 NOTE — Telephone Encounter (Signed)
 Patient has a pcp follow up on 11/05/24

## 2024-11-05 ENCOUNTER — Ambulatory Visit: Admitting: Family Medicine

## 2024-11-05 VITALS — BP 136/80 | HR 95 | Temp 98.4°F | Resp 16 | Ht 66.0 in | Wt 214.2 lb

## 2024-11-05 DIAGNOSIS — E669 Obesity, unspecified: Secondary | ICD-10-CM

## 2024-11-05 DIAGNOSIS — I10 Essential (primary) hypertension: Secondary | ICD-10-CM | POA: Diagnosis not present

## 2024-11-05 LAB — BASIC METABOLIC PANEL WITH GFR
BUN: 17 mg/dL (ref 6–23)
CO2: 30 meq/L (ref 19–32)
Calcium: 9.1 mg/dL (ref 8.4–10.5)
Chloride: 105 meq/L (ref 96–112)
Creatinine, Ser: 1.04 mg/dL (ref 0.40–1.20)
GFR: 56.18 mL/min — ABNORMAL LOW (ref >60.00)
Glucose, Bld: 100 mg/dL — ABNORMAL HIGH (ref 70–99)
Potassium: 4 meq/L (ref 3.5–5.1)
Sodium: 142 meq/L (ref 135–145)

## 2024-11-05 LAB — LIPID PANEL
Cholesterol: 198 mg/dL (ref 0–200)
HDL: 70.2 mg/dL (ref >39.00)
LDL Cholesterol: 108 mg/dL — ABNORMAL HIGH (ref 0–99)
NonHDL: 127.53
Total CHOL/HDL Ratio: 3
Triglycerides: 96 mg/dL (ref 0.0–149.0)
VLDL: 19.2 mg/dL (ref 0.0–40.0)

## 2024-11-05 LAB — CBC WITH DIFFERENTIAL/PLATELET
Basophils Absolute: 0.1 K/uL (ref 0.0–0.1)
Basophils Relative: 1 % (ref 0.0–3.0)
Eosinophils Absolute: 0.1 K/uL (ref 0.0–0.7)
Eosinophils Relative: 2 % (ref 0.0–5.0)
HCT: 39.9 % (ref 36.0–46.0)
Hemoglobin: 13 g/dL (ref 12.0–15.0)
Lymphocytes Relative: 21.6 % (ref 12.0–46.0)
Lymphs Abs: 1.1 K/uL (ref 0.7–4.0)
MCHC: 32.7 g/dL (ref 30.0–36.0)
MCV: 90 fl (ref 78.0–100.0)
Monocytes Absolute: 0.3 K/uL (ref 0.1–1.0)
Monocytes Relative: 6.5 % (ref 3.0–12.0)
Neutro Abs: 3.6 K/uL (ref 1.4–7.7)
Neutrophils Relative %: 68.9 % (ref 43.0–77.0)
Platelets: 278 K/uL (ref 150.0–400.0)
RBC: 4.43 Mil/uL (ref 3.87–5.11)
RDW: 13.1 % (ref 11.5–15.5)
WBC: 5.2 K/uL (ref 4.0–10.5)

## 2024-11-05 LAB — HEPATIC FUNCTION PANEL
ALT: 8 U/L (ref 0–35)
AST: 11 U/L (ref 0–37)
Albumin: 4.1 g/dL (ref 3.5–5.2)
Alkaline Phosphatase: 73 U/L (ref 39–117)
Bilirubin, Direct: 0.1 mg/dL (ref 0.0–0.3)
Total Bilirubin: 0.6 mg/dL (ref 0.2–1.2)
Total Protein: 6.7 g/dL (ref 6.0–8.3)

## 2024-11-05 LAB — TSH: TSH: 0.89 u[IU]/mL (ref 0.35–5.50)

## 2024-11-05 NOTE — Progress Notes (Unsigned)
   Subjective:    Patient ID: Linda Buckley, female    DOB: Oct 04, 1956, 68 y.o.   MRN: [id number]  HPI HTN- chronic problem, on Amlodipine  5mg  daily.  No CP w/ exertion or exercise.  Will wake up w/ some chest discomfort but pt feels this may be anxiety.  Pain described as sharp, sxs improve w/ holding pressure.  Denies SOB, HA's, visual changes, edema.  Obesity- weight and BMI are stable at 214 and 34.58 respectively.   Review of Systems For ROS see HPI     Objective:   Physical Exam Vitals reviewed.  Constitutional:      General: She is not in acute distress.    Appearance: Normal appearance. She is well-developed.  HENT:     Head: Normocephalic and atraumatic.  Eyes:     Conjunctiva/sclera: Conjunctivae normal.     Pupils: Pupils are equal, round, and reactive to light.  Neck:     Thyroid : No thyromegaly.  Cardiovascular:     Rate and Rhythm: Normal rate and regular rhythm.     Heart sounds: Normal heart sounds. No murmur heard. Pulmonary:     Effort: Pulmonary effort is normal. No respiratory distress.     Breath sounds: Normal breath sounds.  Abdominal:     General: There is no distension.     Palpations: Abdomen is soft.     Tenderness: There is no abdominal tenderness.  Musculoskeletal:     Cervical back: Normal range of motion and neck supple.     Right lower leg: No edema.     Left lower leg: No edema.  Lymphadenopathy:     Cervical: No cervical adenopathy.  Skin:    General: Skin is warm and dry.  Neurological:     Mental Status: She is alert and oriented to person, place, and time.  Psychiatric:        Mood and Affect: Mood normal.        Behavior: Behavior normal.           Assessment & Plan:

## 2024-11-05 NOTE — Patient Instructions (Signed)
 Schedule your complete physical in 6 months We'll notify you of your lab results and make any changes if needed Continue to work on healthy diet and regular exercise- you can do it! Call with any questions or concerns Stay Safe!  Stay Healthy! Happy Holidays!!

## 2024-11-07 NOTE — Assessment & Plan Note (Signed)
 Ongoing issue.  Stable.  Encouraged low carb diet and regular exercise.  Will follow.

## 2024-11-07 NOTE — Assessment & Plan Note (Signed)
 Chronic problem.  Adequate control today on Amlodipine .  Currently asymptomatic.  CP she describes is not consistent w/ cardiac chest pain and is more likely musculoskeletal since she is favoring her R shoulder and doing more w/ her L.  Check labs.  Will follow.

## 2024-11-08 ENCOUNTER — Ambulatory Visit: Payer: Self-pay | Admitting: Family Medicine

## 2024-11-10 ENCOUNTER — Telehealth: Payer: Self-pay | Admitting: Family Medicine

## 2024-11-10 DIAGNOSIS — M25511 Pain in right shoulder: Secondary | ICD-10-CM

## 2024-11-10 DIAGNOSIS — M542 Cervicalgia: Secondary | ICD-10-CM | POA: Diagnosis not present

## 2024-11-10 NOTE — Telephone Encounter (Signed)
 Ok to refer to Ortho- Dr Margeret at [facility], dx- R shoulder pain

## 2024-11-10 NOTE — Telephone Encounter (Signed)
 Pt reports she needs referral for pain mgnt.  Pt states she was seen 11/10/2024 at [facility] and they ask that this referral is placed by PCP   Please advise on referral

## 2024-11-10 NOTE — Telephone Encounter (Signed)
 Copied from CRM #[id number]. Topic: Referral - Request for Referral >> Nov 10, 2024 11:12 AM Thersia BROCKS wrote: Did the patient discuss referral with their provider in the last year? Yes (If No - schedule appointment) (If Yes - send message)  Appointment offered? Yes  Type of order/referral and detailed reason for visit: Pain Management   Preference of office, provider, location:  [facility] [place], MD [address] [place], [place] [address] Phone: [phone]: [phone] If referral order, have you been seen by this specialty before? Yes (If Yes, this issue or another issue? When? Where?  Can we respond through MyChart? Yes

## 2024-11-10 NOTE — Addendum Note (Signed)
 Addended by: Graysyn Bache A on: 11/10/2024 02:20 PM   Modules accepted: Orders

## 2024-11-10 NOTE — Telephone Encounter (Signed)
 Referral placed for ortho

## 2024-11-23 DIAGNOSIS — K912 Postsurgical malabsorption, not elsewhere classified: Secondary | ICD-10-CM | POA: Diagnosis not present

## 2024-11-23 DIAGNOSIS — Z903 Acquired absence of stomach [part of]: Secondary | ICD-10-CM | POA: Diagnosis not present

## 2024-12-07 ENCOUNTER — Other Ambulatory Visit: Payer: Self-pay

## 2024-12-07 MED ORDER — AMLODIPINE BESYLATE 5 MG PO TABS: 5.0000 mg | ORAL_TABLET | Freq: Every day | ORAL | 1 refills | Status: AC

## 2024-12-22 ENCOUNTER — Encounter: Payer: Self-pay | Admitting: Student in an Organized Health Care Education/Training Program

## 2024-12-22 ENCOUNTER — Other Ambulatory Visit: Payer: Self-pay

## 2024-12-22 ENCOUNTER — Ambulatory Visit: Payer: Self-pay

## 2024-12-22 ENCOUNTER — Ambulatory Visit: Admitting: Student in an Organized Health Care Education/Training Program

## 2024-12-22 VITALS — BP 123/85 | HR 78 | Temp 98.4°F | Wt 216.0 lb

## 2024-12-22 DIAGNOSIS — R519 Headache, unspecified: Secondary | ICD-10-CM | POA: Diagnosis not present

## 2024-12-22 DIAGNOSIS — R5383 Other fatigue: Secondary | ICD-10-CM

## 2024-12-22 DIAGNOSIS — R197 Diarrhea, unspecified: Secondary | ICD-10-CM | POA: Diagnosis not present

## 2024-12-22 DIAGNOSIS — R059 Cough, unspecified: Secondary | ICD-10-CM

## 2024-12-22 DIAGNOSIS — R070 Pain in throat: Secondary | ICD-10-CM | POA: Diagnosis not present

## 2024-12-22 DIAGNOSIS — J111 Influenza due to unidentified influenza virus with other respiratory manifestations: Secondary | ICD-10-CM | POA: Insufficient documentation

## 2024-12-22 LAB — POCT INFLUENZA A/B
Influenza A, POC: NEGATIVE
Influenza B, POC: NEGATIVE

## 2024-12-22 LAB — POC COVID19 BINAXNOW: SARS Coronavirus 2 Ag: NEGATIVE

## 2024-12-22 MED ORDER — CLONAZEPAM 0.5 MG PO TABS: ORAL_TABLET | ORAL | 3 refills | Status: AC

## 2024-12-22 NOTE — Telephone Encounter (Signed)
 FYI Only or Action Required?: FYI only for provider: appointment scheduled on 12/22/24.  Patient was last seen in primary care on 11/05/2024 by Mahlon Comer BRAVO, MD.  Called Nurse Triage reporting Extremity Weakness, Generalized Body Aches, Headache, and Nausea.  Symptoms began several days ago.  Interventions attempted: Nothing.  Symptoms are: gradually worsening.  Triage Disposition: Call PCP Within 24 Hours  Patient/caregiver understands and will follow disposition?: Yes   Copied from CRM #[id number]. Topic: Clinical - Red Word Triage >> Dec 22, 2024  9:23 AM Adelita BRAVO wrote: [facility] that prompted transfer to Nurse Triage: Body aches/pain, sore throat, red blotches on both arms, headaches, diarrhea for 2 days, and body weakness. Reason for Disposition  Patient is HIGH RISK (e.g., 65 years and older, pregnant, HIV+, or chronic medical condition)  Answer Assessment - Initial Assessment Questions 1. SYMPTOMS: What is your main symptom or concern? (e.g., cough, fever, shortness of breath, muscle aches)     Weakness and diarrhea  2. ONSET: When did the symptoms start?      2 days  3. COUGH: Do you have a cough? If Yes, ask: How bad is the cough?       Denies  4. FEVER: Do you have a fever? If Yes, ask: What is your temperature, how was it measured, and when did it start?     Denies  5. BREATHING DIFFICULTY: Are you having any difficulty breathing? (e.g., normal; shortness of breath, wheezing, unable to speak)      A little shortness of breath, I guess like exhaustion, I'm so tired. Pt speaking in full sentences at a time over the phone.  6. BETTER-SAME-WORSE: Are you getting better, staying the same or getting worse compared to yesterday?  If getting worse, ask, In what way?     Worse.  7. OTHER SYMPTOMS: Do you have any other symptoms?  (e.g., chills, fatigue, headache, loss of smell or taste, muscle pain, sore throat)     HA, fatigue, poor appetite,  diarrhea, nauseous, red rash to arm that is improving  8. INFLUENZA EXPOSURE: Was there any known exposure to influenza (flu) before the symptoms began?      Possibly, was around ill grandchild on Sunday   11. HIGH RISK FOR COMPLICATIONS: Do you have any chronic medical problems? (e.g., asthma, heart or lung disease, obesity, weak immune system)       Denies  Protocols used: Influenza (Flu) Suspected-A-AH

## 2024-12-22 NOTE — Telephone Encounter (Signed)
 Requested Prescriptions   Pending Prescriptions Disp Refills   clonazePAM  (KLONOPIN ) 0.5 MG tablet 30 tablet 3    Sig: TAKE ONE TABLET BY MOUTH EVERY NIGHT AT BEDTIME     Date of patient request: 12/23/2023 Last office visit: 11/05/2024 Upcoming visit: 03/31/2025 Date of last refill: 10/01/2024 Last refill amount: 30 tab 0 refills    Patient asked for refill while in office seeing Dr.Vincent

## 2024-12-22 NOTE — Patient Instructions (Signed)
" °  VISIT SUMMARY: You came in today with symptoms of a viral illness, including diarrhea, fever, body aches, and weakness. You also reported a sore throat, slight cough, nausea, and red spots on your arms. Your COVID-19 and influenza tests were negative, and there are no signs of a bacterial infection.  YOUR PLAN: -VIRAL RESPIRATORY AND GASTROINTESTINAL INFECTION: You have a viral infection affecting your respiratory and digestive systems. This type of infection usually improves with supportive care. You should use acetaminophen  and ibuprofen to help with body aches. It's important to stay hydrated by drinking fluids, chicken noodle soup, and broths. Make sure to get plenty of rest and monitor your symptoms. Avoid taking antibiotics as they are not effective against viral infections. Keep an eye on your symptoms and seek medical attention if they worsen. Avoid contact with others, especially those who are more susceptible to illness, for at least 48 hours after your fever goes away. If you are unable to stay hydrated or feel faint, seek emergency care immediately.   INSTRUCTIONS: Monitor your symptoms and seek medical attention if they worsen. Avoid contact with others for at least 48 hours after your fever subsides. If you are unable to stay hydrated or feel faint, seek emergency care immediately.  "

## 2024-12-22 NOTE — Progress Notes (Signed)
" ° °  Acute Office Visit  Patient ID: Linda Buckley, female    DOB: 08/16/1956, 69 y.o.   MRN: [id number]  PCP: Mahlon Comer BRAVO, MD  Chief Complaint  Patient presents with   Cough     Body aches/pain, sore throat, red blotches on both arms, headaches, diarrhea for 2 days, and body weakness.    Subjective:     HPI  Discussed the use of AI scribe software for clinical note transcription with the patient, who gave verbal consent to proceed.  History of Present Illness Linda Buckley is a 69 year old female who presents with symptoms of a viral illness including diarrhea, fever, and body aches.  She began feeling unwell on Sunday night, with symptoms worsening by Monday. She has experienced extreme weakness, body aches, and red spots on her arms, which have decreased but are still visible. She had a fever of 101F yesterday and persistent diarrhea, with approximately eight bowel movements yesterday and two this morning.  She reports generalized body aches, a sore throat, and a slight cough without sputum production. She also experienced nausea and vomited on Monday but has not vomited since, although she continues to feel nauseous. She has been able to drink fluids but has not been eating.  She feels short of breath and has been struggling with weakness, although she is managing to get around at home with some assistance. She has been wearing a mask and spraying her house, and has been opening her doors.  Her current medications include clonazepam , for which she needs a prescription refill.      Objective:    BP 123/85   Pulse 78   Temp 98.4 F (36.9 C) (Oral)   Wt 216 lb (98 kg)   SpO2 99%   BMI 34.86 kg/m   Physical Exam  Gen: Tired appearing woman Ears: Bilateral tympanic membranes are mildly erythematous but no middle ear effusions Mouth: No oral lesions, no erythema of the posterior oropharynx Neck: No tender adenopathy Heart: Regular, no murmur Lungs:  Unlabored, clear throughout, no crackles or wheezing   Results for orders placed or performed in visit on 12/22/24  POC COVID-19  Result Value Ref Range   SARS Coronavirus 2 Ag Negative Negative  POCT Influenza A/B  Result Value Ref Range   Influenza A, POC Negative Negative   Influenza B, POC Negative Negative       Assessment & Plan:   Problem List Items Addressed This Visit       Unprioritized   Influenza-like illness - Primary   Symptoms align with a viral cause; COVID-19 and influenza tests are negative, and there are no signs of bacterial infection. Improvement is expected with supportive care. Use acetaminophen  and ibuprofen for body aches. Maintain hydration with fluids, chicken noodle soup, and broths. Rest and monitor for symptom improvement. Avoid antibiotics. Monitor for worsening symptoms and seek medical attention if needed. Avoid contact with susceptible individuals for 48 hours after fever subsides. Seek emergency care if unable to maintain hydration or experiencing syncope.      Relevant Orders   POC COVID-19 (Completed)   POCT Influenza A/B (Completed)    Return if symptoms worsen or fail to improve.  Cleatus Debby Specking, MD [facility]   "

## 2024-12-22 NOTE — Assessment & Plan Note (Signed)
 Symptoms align with a viral cause; COVID-19 and influenza tests are negative, and there are no signs of bacterial infection. Improvement is expected with supportive care. Use acetaminophen  and ibuprofen for body aches. Maintain hydration with fluids, chicken noodle soup, and broths. Rest and monitor for symptom improvement. Avoid antibiotics. Monitor for worsening symptoms and seek medical attention if needed. Avoid contact with susceptible individuals for 48 hours after fever subsides. Seek emergency care if unable to maintain hydration or experiencing syncope.

## 2025-03-31 ENCOUNTER — Encounter: Admitting: Family Medicine

## 2025-04-01 ENCOUNTER — Inpatient Hospital Stay

## 2025-04-01 ENCOUNTER — Inpatient Hospital Stay: Admitting: Oncology

## 2025-06-28 ENCOUNTER — Ambulatory Visit: Admitting: Rheumatology

## 2025-06-28 ENCOUNTER — Encounter
